# Patient Record
Sex: Female | Born: 1960
Health system: Southern US, Community
[De-identification: ages and names within clinical notes are randomized; demographics above are authoritative.]

## PROBLEM LIST (undated history)

## (undated) ENCOUNTER — Ambulatory Visit (HOSPITAL_BASED_OUTPATIENT_CLINIC_OR_DEPARTMENT_OTHER): Source: Home / Self Care

## (undated) DIAGNOSIS — E039 Hypothyroidism, unspecified: Secondary | ICD-10-CM

## (undated) DIAGNOSIS — Z8669 Personal history of other diseases of the nervous system and sense organs: Secondary | ICD-10-CM

## (undated) DIAGNOSIS — F329 Major depressive disorder, single episode, unspecified: Secondary | ICD-10-CM

## (undated) DIAGNOSIS — M810 Age-related osteoporosis without current pathological fracture: Secondary | ICD-10-CM

## (undated) DIAGNOSIS — N183 Chronic kidney disease, stage 3 unspecified: Secondary | ICD-10-CM

## (undated) DIAGNOSIS — R1310 Dysphagia, unspecified: Secondary | ICD-10-CM

## (undated) DIAGNOSIS — I739 Peripheral vascular disease, unspecified: Secondary | ICD-10-CM

## (undated) DIAGNOSIS — F410 Panic disorder [episodic paroxysmal anxiety] without agoraphobia: Secondary | ICD-10-CM

## (undated) DIAGNOSIS — G959 Disease of spinal cord, unspecified: Secondary | ICD-10-CM

## (undated) DIAGNOSIS — F4310 Post-traumatic stress disorder, unspecified: Secondary | ICD-10-CM

## (undated) DIAGNOSIS — I7409 Other arterial embolism and thrombosis of abdominal aorta: Secondary | ICD-10-CM

## (undated) DIAGNOSIS — I998 Other disorder of circulatory system: Secondary | ICD-10-CM

## (undated) DIAGNOSIS — F32A Depression, unspecified: Secondary | ICD-10-CM

## (undated) DIAGNOSIS — G894 Chronic pain syndrome: Secondary | ICD-10-CM

## (undated) DIAGNOSIS — N179 Acute kidney failure, unspecified: Secondary | ICD-10-CM

## (undated) DIAGNOSIS — E785 Hyperlipidemia, unspecified: Secondary | ICD-10-CM

## (undated) HISTORY — DX: Other disorder of circulatory system: I99.8

## (undated) HISTORY — DX: Acute kidney failure, unspecified: N17.9

## (undated) HISTORY — DX: Chronic pain syndrome: G89.4

## (undated) HISTORY — DX: Peripheral vascular disease, unspecified: I73.9

## (undated) HISTORY — PX: BACK SURGERY: SHX140

## (undated) HISTORY — DX: Other arterial embolism and thrombosis of abdominal aorta: I74.09

---

## 2014-01-09 DIAGNOSIS — F32A Depression, unspecified: Secondary | ICD-10-CM | POA: Insufficient documentation

## 2014-01-09 DIAGNOSIS — F329 Major depressive disorder, single episode, unspecified: Secondary | ICD-10-CM | POA: Insufficient documentation

## 2015-07-08 DIAGNOSIS — M810 Age-related osteoporosis without current pathological fracture: Secondary | ICD-10-CM | POA: Diagnosis not present

## 2015-07-08 DIAGNOSIS — Z6821 Body mass index (BMI) 21.0-21.9, adult: Secondary | ICD-10-CM | POA: Diagnosis not present

## 2015-07-08 DIAGNOSIS — Z23 Encounter for immunization: Secondary | ICD-10-CM | POA: Diagnosis not present

## 2015-07-08 DIAGNOSIS — E781 Pure hyperglyceridemia: Secondary | ICD-10-CM | POA: Diagnosis not present

## 2015-07-08 DIAGNOSIS — Z87891 Personal history of nicotine dependence: Secondary | ICD-10-CM | POA: Diagnosis not present

## 2015-07-08 DIAGNOSIS — Z720 Tobacco use: Secondary | ICD-10-CM | POA: Diagnosis not present

## 2015-07-08 DIAGNOSIS — R7309 Other abnormal glucose: Secondary | ICD-10-CM | POA: Diagnosis not present

## 2015-07-08 DIAGNOSIS — E039 Hypothyroidism, unspecified: Secondary | ICD-10-CM | POA: Diagnosis not present

## 2015-07-16 DIAGNOSIS — F4310 Post-traumatic stress disorder, unspecified: Secondary | ICD-10-CM | POA: Diagnosis not present

## 2015-07-24 DIAGNOSIS — Z1231 Encounter for screening mammogram for malignant neoplasm of breast: Secondary | ICD-10-CM | POA: Diagnosis not present

## 2015-11-04 DIAGNOSIS — R7301 Impaired fasting glucose: Secondary | ICD-10-CM | POA: Diagnosis not present

## 2015-11-04 DIAGNOSIS — Z23 Encounter for immunization: Secondary | ICD-10-CM | POA: Diagnosis not present

## 2015-11-04 DIAGNOSIS — J00 Acute nasopharyngitis [common cold]: Secondary | ICD-10-CM | POA: Diagnosis not present

## 2015-11-04 DIAGNOSIS — H6122 Impacted cerumen, left ear: Secondary | ICD-10-CM | POA: Diagnosis not present

## 2015-11-04 DIAGNOSIS — R7303 Prediabetes: Secondary | ICD-10-CM | POA: Diagnosis not present

## 2015-11-04 DIAGNOSIS — E039 Hypothyroidism, unspecified: Secondary | ICD-10-CM | POA: Diagnosis not present

## 2015-11-04 DIAGNOSIS — Z87891 Personal history of nicotine dependence: Secondary | ICD-10-CM | POA: Diagnosis not present

## 2015-11-04 DIAGNOSIS — M810 Age-related osteoporosis without current pathological fracture: Secondary | ICD-10-CM | POA: Diagnosis not present

## 2015-11-04 DIAGNOSIS — E784 Other hyperlipidemia: Secondary | ICD-10-CM | POA: Diagnosis not present

## 2015-11-05 DIAGNOSIS — J00 Acute nasopharyngitis [common cold]: Secondary | ICD-10-CM | POA: Diagnosis not present

## 2015-11-05 DIAGNOSIS — Z23 Encounter for immunization: Secondary | ICD-10-CM | POA: Diagnosis not present

## 2015-11-05 DIAGNOSIS — H6122 Impacted cerumen, left ear: Secondary | ICD-10-CM | POA: Diagnosis not present

## 2015-11-05 DIAGNOSIS — E784 Other hyperlipidemia: Secondary | ICD-10-CM | POA: Diagnosis not present

## 2015-11-05 DIAGNOSIS — R7303 Prediabetes: Secondary | ICD-10-CM | POA: Diagnosis not present

## 2015-11-05 DIAGNOSIS — M810 Age-related osteoporosis without current pathological fracture: Secondary | ICD-10-CM | POA: Diagnosis not present

## 2015-11-05 DIAGNOSIS — E039 Hypothyroidism, unspecified: Secondary | ICD-10-CM | POA: Diagnosis not present

## 2015-11-05 DIAGNOSIS — Z87891 Personal history of nicotine dependence: Secondary | ICD-10-CM | POA: Diagnosis not present

## 2015-11-06 DIAGNOSIS — E784 Other hyperlipidemia: Secondary | ICD-10-CM | POA: Diagnosis not present

## 2015-11-06 DIAGNOSIS — J00 Acute nasopharyngitis [common cold]: Secondary | ICD-10-CM | POA: Diagnosis not present

## 2015-11-06 DIAGNOSIS — M810 Age-related osteoporosis without current pathological fracture: Secondary | ICD-10-CM | POA: Diagnosis not present

## 2015-11-06 DIAGNOSIS — H6122 Impacted cerumen, left ear: Secondary | ICD-10-CM | POA: Diagnosis not present

## 2015-11-06 DIAGNOSIS — E039 Hypothyroidism, unspecified: Secondary | ICD-10-CM | POA: Diagnosis not present

## 2015-11-06 DIAGNOSIS — Z23 Encounter for immunization: Secondary | ICD-10-CM | POA: Diagnosis not present

## 2015-11-06 DIAGNOSIS — Z87891 Personal history of nicotine dependence: Secondary | ICD-10-CM | POA: Diagnosis not present

## 2015-11-06 DIAGNOSIS — R7303 Prediabetes: Secondary | ICD-10-CM | POA: Diagnosis not present

## 2015-11-07 DIAGNOSIS — M810 Age-related osteoporosis without current pathological fracture: Secondary | ICD-10-CM | POA: Diagnosis not present

## 2015-11-07 DIAGNOSIS — E039 Hypothyroidism, unspecified: Secondary | ICD-10-CM | POA: Diagnosis not present

## 2015-11-07 DIAGNOSIS — E784 Other hyperlipidemia: Secondary | ICD-10-CM | POA: Diagnosis not present

## 2015-11-07 DIAGNOSIS — Z23 Encounter for immunization: Secondary | ICD-10-CM | POA: Diagnosis not present

## 2015-11-07 DIAGNOSIS — J00 Acute nasopharyngitis [common cold]: Secondary | ICD-10-CM | POA: Diagnosis not present

## 2015-11-07 DIAGNOSIS — H6122 Impacted cerumen, left ear: Secondary | ICD-10-CM | POA: Diagnosis not present

## 2015-11-07 DIAGNOSIS — R7303 Prediabetes: Secondary | ICD-10-CM | POA: Diagnosis not present

## 2015-11-07 DIAGNOSIS — Z87891 Personal history of nicotine dependence: Secondary | ICD-10-CM | POA: Diagnosis not present

## 2015-11-08 DIAGNOSIS — Z23 Encounter for immunization: Secondary | ICD-10-CM | POA: Diagnosis not present

## 2015-11-08 DIAGNOSIS — M810 Age-related osteoporosis without current pathological fracture: Secondary | ICD-10-CM | POA: Diagnosis not present

## 2015-11-08 DIAGNOSIS — J00 Acute nasopharyngitis [common cold]: Secondary | ICD-10-CM | POA: Diagnosis not present

## 2015-11-08 DIAGNOSIS — R7303 Prediabetes: Secondary | ICD-10-CM | POA: Diagnosis not present

## 2015-11-08 DIAGNOSIS — Z87891 Personal history of nicotine dependence: Secondary | ICD-10-CM | POA: Diagnosis not present

## 2015-11-08 DIAGNOSIS — E039 Hypothyroidism, unspecified: Secondary | ICD-10-CM | POA: Diagnosis not present

## 2015-11-08 DIAGNOSIS — E784 Other hyperlipidemia: Secondary | ICD-10-CM | POA: Diagnosis not present

## 2015-11-08 DIAGNOSIS — H6122 Impacted cerumen, left ear: Secondary | ICD-10-CM | POA: Diagnosis not present

## 2015-11-09 DIAGNOSIS — Z23 Encounter for immunization: Secondary | ICD-10-CM | POA: Diagnosis not present

## 2015-11-09 DIAGNOSIS — M810 Age-related osteoporosis without current pathological fracture: Secondary | ICD-10-CM | POA: Diagnosis not present

## 2015-11-09 DIAGNOSIS — E039 Hypothyroidism, unspecified: Secondary | ICD-10-CM | POA: Diagnosis not present

## 2015-11-09 DIAGNOSIS — Z87891 Personal history of nicotine dependence: Secondary | ICD-10-CM | POA: Diagnosis not present

## 2015-11-09 DIAGNOSIS — E784 Other hyperlipidemia: Secondary | ICD-10-CM | POA: Diagnosis not present

## 2015-11-09 DIAGNOSIS — R7303 Prediabetes: Secondary | ICD-10-CM | POA: Diagnosis not present

## 2015-11-09 DIAGNOSIS — H6122 Impacted cerumen, left ear: Secondary | ICD-10-CM | POA: Diagnosis not present

## 2015-11-09 DIAGNOSIS — J00 Acute nasopharyngitis [common cold]: Secondary | ICD-10-CM | POA: Diagnosis not present

## 2015-11-11 DIAGNOSIS — Z0000 Encounter for general adult medical examination without abnormal findings: Secondary | ICD-10-CM | POA: Diagnosis not present

## 2015-11-11 DIAGNOSIS — M545 Low back pain: Secondary | ICD-10-CM | POA: Diagnosis not present

## 2015-11-11 DIAGNOSIS — Z6821 Body mass index (BMI) 21.0-21.9, adult: Secondary | ICD-10-CM | POA: Diagnosis not present

## 2016-05-18 DIAGNOSIS — Z981 Arthrodesis status: Secondary | ICD-10-CM

## 2016-05-18 HISTORY — DX: Arthrodesis status: Z98.1

## 2016-11-12 DIAGNOSIS — R69 Illness, unspecified: Secondary | ICD-10-CM | POA: Diagnosis not present

## 2016-12-26 DIAGNOSIS — S61411A Laceration without foreign body of right hand, initial encounter: Secondary | ICD-10-CM | POA: Diagnosis not present

## 2016-12-26 DIAGNOSIS — Z23 Encounter for immunization: Secondary | ICD-10-CM | POA: Diagnosis not present

## 2016-12-26 DIAGNOSIS — W25XXXA Contact with sharp glass, initial encounter: Secondary | ICD-10-CM | POA: Diagnosis not present

## 2016-12-30 DIAGNOSIS — I70229 Atherosclerosis of native arteries of extremities with rest pain, unspecified extremity: Secondary | ICD-10-CM

## 2016-12-30 DIAGNOSIS — I7409 Other arterial embolism and thrombosis of abdominal aorta: Secondary | ICD-10-CM | POA: Insufficient documentation

## 2016-12-30 HISTORY — DX: Atherosclerosis of native arteries of extremities with rest pain, unspecified extremity: I70.229

## 2016-12-30 HISTORY — DX: Other arterial embolism and thrombosis of abdominal aorta: I74.09

## 2016-12-30 HISTORY — PX: TRANSTHORACIC ECHOCARDIOGRAM: SHX275

## 2017-01-03 DIAGNOSIS — L0390 Cellulitis, unspecified: Secondary | ICD-10-CM | POA: Diagnosis not present

## 2017-01-03 DIAGNOSIS — M79643 Pain in unspecified hand: Secondary | ICD-10-CM | POA: Diagnosis not present

## 2017-01-03 DIAGNOSIS — S61409A Unspecified open wound of unspecified hand, initial encounter: Secondary | ICD-10-CM | POA: Diagnosis not present

## 2017-01-03 DIAGNOSIS — Z4800 Encounter for change or removal of nonsurgical wound dressing: Secondary | ICD-10-CM | POA: Diagnosis not present

## 2017-01-03 DIAGNOSIS — Z6821 Body mass index (BMI) 21.0-21.9, adult: Secondary | ICD-10-CM | POA: Diagnosis not present

## 2017-01-07 DIAGNOSIS — S61411A Laceration without foreign body of right hand, initial encounter: Secondary | ICD-10-CM | POA: Diagnosis not present

## 2017-01-07 DIAGNOSIS — L0390 Cellulitis, unspecified: Secondary | ICD-10-CM | POA: Diagnosis not present

## 2017-01-07 DIAGNOSIS — R1110 Vomiting, unspecified: Secondary | ICD-10-CM | POA: Diagnosis not present

## 2017-01-07 DIAGNOSIS — R110 Nausea: Secondary | ICD-10-CM | POA: Diagnosis not present

## 2017-01-07 DIAGNOSIS — Z4802 Encounter for removal of sutures: Secondary | ICD-10-CM | POA: Diagnosis not present

## 2017-01-08 ENCOUNTER — Encounter (HOSPITAL_COMMUNITY)
Admission: EM | Disposition: A | Discharge status: Rehabilitation Facility | Payer: Self-pay | Source: Home / Self Care | Attending: Vascular Surgery

## 2017-01-08 ENCOUNTER — Inpatient Hospital Stay (HOSPITAL_COMMUNITY): Payer: Medicare HMO | Admitting: Anesthesiology

## 2017-01-08 ENCOUNTER — Emergency Department (HOSPITAL_COMMUNITY): Payer: Medicare HMO | Admitting: Certified Registered Nurse Anesthetist

## 2017-01-08 ENCOUNTER — Inpatient Hospital Stay (HOSPITAL_COMMUNITY)
Admission: EM | Admit: 2017-01-08 | Discharge: 2017-01-13 | DRG: 271 | Disposition: A | Discharge status: Rehabilitation Facility | Payer: Medicare HMO | Attending: Vascular Surgery | Admitting: Vascular Surgery

## 2017-01-08 ENCOUNTER — Encounter (HOSPITAL_COMMUNITY): Payer: Self-pay

## 2017-01-08 DIAGNOSIS — I743 Embolism and thrombosis of arteries of the lower extremities: Principal | ICD-10-CM | POA: Diagnosis present

## 2017-01-08 DIAGNOSIS — Z79899 Other long term (current) drug therapy: Secondary | ICD-10-CM

## 2017-01-08 DIAGNOSIS — L270 Generalized skin eruption due to drugs and medicaments taken internally: Secondary | ICD-10-CM | POA: Diagnosis not present

## 2017-01-08 DIAGNOSIS — N183 Chronic kidney disease, stage 3 unspecified: Secondary | ICD-10-CM

## 2017-01-08 DIAGNOSIS — M79662 Pain in left lower leg: Secondary | ICD-10-CM | POA: Diagnosis not present

## 2017-01-08 DIAGNOSIS — D631 Anemia in chronic kidney disease: Secondary | ICD-10-CM | POA: Diagnosis not present

## 2017-01-08 DIAGNOSIS — N141 Nephropathy induced by other drugs, medicaments and biological substances: Secondary | ICD-10-CM | POA: Diagnosis not present

## 2017-01-08 DIAGNOSIS — I8291 Chronic embolism and thrombosis of unspecified vein: Secondary | ICD-10-CM | POA: Diagnosis not present

## 2017-01-08 DIAGNOSIS — E785 Hyperlipidemia, unspecified: Secondary | ICD-10-CM | POA: Diagnosis present

## 2017-01-08 DIAGNOSIS — D696 Thrombocytopenia, unspecified: Secondary | ICD-10-CM | POA: Diagnosis not present

## 2017-01-08 DIAGNOSIS — M6282 Rhabdomyolysis: Secondary | ICD-10-CM | POA: Diagnosis not present

## 2017-01-08 DIAGNOSIS — I70202 Unspecified atherosclerosis of native arteries of extremities, left leg: Secondary | ICD-10-CM | POA: Diagnosis not present

## 2017-01-08 DIAGNOSIS — M810 Age-related osteoporosis without current pathological fracture: Secondary | ICD-10-CM | POA: Diagnosis present

## 2017-01-08 DIAGNOSIS — M79605 Pain in left leg: Secondary | ICD-10-CM | POA: Diagnosis not present

## 2017-01-08 DIAGNOSIS — R52 Pain, unspecified: Secondary | ICD-10-CM | POA: Diagnosis not present

## 2017-01-08 DIAGNOSIS — E870 Hyperosmolality and hypernatremia: Secondary | ICD-10-CM | POA: Diagnosis not present

## 2017-01-08 DIAGNOSIS — E1129 Type 2 diabetes mellitus with other diabetic kidney complication: Secondary | ICD-10-CM | POA: Diagnosis not present

## 2017-01-08 DIAGNOSIS — Z419 Encounter for procedure for purposes other than remedying health state, unspecified: Secondary | ICD-10-CM

## 2017-01-08 DIAGNOSIS — M79672 Pain in left foot: Secondary | ICD-10-CM | POA: Diagnosis not present

## 2017-01-08 DIAGNOSIS — F410 Panic disorder [episodic paroxysmal anxiety] without agoraphobia: Secondary | ICD-10-CM | POA: Diagnosis present

## 2017-01-08 DIAGNOSIS — F17200 Nicotine dependence, unspecified, uncomplicated: Secondary | ICD-10-CM | POA: Diagnosis present

## 2017-01-08 DIAGNOSIS — F4310 Post-traumatic stress disorder, unspecified: Secondary | ICD-10-CM

## 2017-01-08 DIAGNOSIS — G959 Disease of spinal cord, unspecified: Secondary | ICD-10-CM | POA: Diagnosis not present

## 2017-01-08 DIAGNOSIS — T508X5A Adverse effect of diagnostic agents, initial encounter: Secondary | ICD-10-CM | POA: Diagnosis not present

## 2017-01-08 DIAGNOSIS — M62262 Nontraumatic ischemic infarction of muscle, left lower leg: Secondary | ICD-10-CM | POA: Diagnosis not present

## 2017-01-08 DIAGNOSIS — Z89612 Acquired absence of left leg above knee: Secondary | ICD-10-CM | POA: Diagnosis not present

## 2017-01-08 DIAGNOSIS — E1152 Type 2 diabetes mellitus with diabetic peripheral angiopathy with gangrene: Secondary | ICD-10-CM | POA: Diagnosis present

## 2017-01-08 DIAGNOSIS — G894 Chronic pain syndrome: Secondary | ICD-10-CM | POA: Diagnosis present

## 2017-01-08 DIAGNOSIS — D62 Acute posthemorrhagic anemia: Secondary | ICD-10-CM | POA: Diagnosis not present

## 2017-01-08 DIAGNOSIS — N179 Acute kidney failure, unspecified: Secondary | ICD-10-CM | POA: Diagnosis not present

## 2017-01-08 DIAGNOSIS — E039 Hypothyroidism, unspecified: Secondary | ICD-10-CM | POA: Diagnosis present

## 2017-01-08 DIAGNOSIS — I129 Hypertensive chronic kidney disease with stage 1 through stage 4 chronic kidney disease, or unspecified chronic kidney disease: Secondary | ICD-10-CM | POA: Diagnosis not present

## 2017-01-08 DIAGNOSIS — D72829 Elevated white blood cell count, unspecified: Secondary | ICD-10-CM

## 2017-01-08 DIAGNOSIS — I998 Other disorder of circulatory system: Secondary | ICD-10-CM | POA: Diagnosis not present

## 2017-01-08 DIAGNOSIS — R112 Nausea with vomiting, unspecified: Secondary | ICD-10-CM | POA: Diagnosis not present

## 2017-01-08 DIAGNOSIS — I745 Embolism and thrombosis of iliac artery: Secondary | ICD-10-CM | POA: Diagnosis present

## 2017-01-08 DIAGNOSIS — I82402 Acute embolism and thrombosis of unspecified deep veins of left lower extremity: Secondary | ICD-10-CM | POA: Diagnosis not present

## 2017-01-08 DIAGNOSIS — G546 Phantom limb syndrome with pain: Secondary | ICD-10-CM

## 2017-01-08 DIAGNOSIS — R69 Illness, unspecified: Secondary | ICD-10-CM | POA: Diagnosis not present

## 2017-01-08 DIAGNOSIS — E1122 Type 2 diabetes mellitus with diabetic chronic kidney disease: Secondary | ICD-10-CM | POA: Diagnosis present

## 2017-01-08 DIAGNOSIS — I10 Essential (primary) hypertension: Secondary | ICD-10-CM | POA: Diagnosis not present

## 2017-01-08 DIAGNOSIS — F329 Major depressive disorder, single episode, unspecified: Secondary | ICD-10-CM | POA: Diagnosis present

## 2017-01-08 DIAGNOSIS — S78112A Complete traumatic amputation at level between left hip and knee, initial encounter: Secondary | ICD-10-CM

## 2017-01-08 DIAGNOSIS — I739 Peripheral vascular disease, unspecified: Secondary | ICD-10-CM | POA: Diagnosis not present

## 2017-01-08 HISTORY — DX: Disease of spinal cord, unspecified: G95.9

## 2017-01-08 HISTORY — PX: EMBOLECTOMY: SHX44

## 2017-01-08 HISTORY — DX: Hyperlipidemia, unspecified: E78.5

## 2017-01-08 HISTORY — DX: Depression, unspecified: F32.A

## 2017-01-08 HISTORY — PX: AORTOGRAM: SHX6300

## 2017-01-08 HISTORY — DX: Dysphagia, unspecified: R13.10

## 2017-01-08 HISTORY — DX: Hypothyroidism, unspecified: E03.9

## 2017-01-08 HISTORY — DX: Panic disorder (episodic paroxysmal anxiety): F41.0

## 2017-01-08 HISTORY — PX: INSERTION OF ILIAC STENT: SHX6256

## 2017-01-08 HISTORY — DX: Major depressive disorder, single episode, unspecified: F32.9

## 2017-01-08 HISTORY — DX: Other disorder of circulatory system: I99.8

## 2017-01-08 HISTORY — DX: Post-traumatic stress disorder, unspecified: F43.10

## 2017-01-08 HISTORY — PX: ENDARTERECTOMY POPLITEAL: SHX5806

## 2017-01-08 HISTORY — DX: Age-related osteoporosis without current pathological fracture: M81.0

## 2017-01-08 HISTORY — DX: Personal history of other diseases of the nervous system and sense organs: Z86.69

## 2017-01-08 HISTORY — PX: LOWER EXTREMITY ANGIOGRAM: SHX5508

## 2017-01-08 HISTORY — PX: PATCH ANGIOPLASTY: SHX6230

## 2017-01-08 HISTORY — PX: ARTERY EXPLORATION: SHX5110

## 2017-01-08 LAB — BASIC METABOLIC PANEL
Anion gap: 15 (ref 5–15)
Anion gap: 9 (ref 5–15)
BUN: 23 mg/dL — ABNORMAL HIGH (ref 6–20)
BUN: 18 mg/dL (ref 6–20)
CALCIUM: 8.2 mg/dL — AB (ref 8.9–10.3)
CHLORIDE: 115 mmol/L — AB (ref 101–111)
CO2: 13 mmol/L — ABNORMAL LOW (ref 22–32)
CO2: 17 mmol/L — AB (ref 22–32)
CREATININE: 1.61 mg/dL — AB (ref 0.44–1.00)
CREATININE: 1.42 mg/dL — AB (ref 0.44–1.00)
Calcium: 7.7 mg/dL — ABNORMAL LOW (ref 8.9–10.3)
Chloride: 107 mmol/L (ref 101–111)
GFR calc Af Amer: 41 mL/min — ABNORMAL LOW (ref >60)
GFR calc Af Amer: 48 mL/min — ABNORMAL LOW (ref >60)
GFR calc non Af Amer: 36 mL/min — ABNORMAL LOW (ref >60)
GFR calc non Af Amer: 41 mL/min — ABNORMAL LOW (ref >60)
GLUCOSE: 151 mg/dL — AB (ref 65–99)
Glucose, Bld: 107 mg/dL — ABNORMAL HIGH (ref 65–99)
Potassium: 3.5 mmol/L (ref 3.5–5.1)
Potassium: 3.9 mmol/L (ref 3.5–5.1)
SODIUM: 135 mmol/L (ref 135–145)
Sodium: 141 mmol/L (ref 135–145)

## 2017-01-08 LAB — POCT I-STAT 7, (LYTES, BLD GAS, ICA,H+H)
Acid-base deficit: 9 mmol/L — ABNORMAL HIGH (ref 0.0–2.0)
Acid-base deficit: 10 mmol/L — ABNORMAL HIGH (ref 0.0–2.0)
Acid-base deficit: 8 mmol/L — ABNORMAL HIGH (ref 0.0–2.0)
Acid-base deficit: 5 mmol/L — ABNORMAL HIGH (ref 0.0–2.0)
BICARBONATE: 17 mmol/L — AB (ref 20.0–28.0)
Bicarbonate: 16.7 mmol/L — ABNORMAL LOW (ref 20.0–28.0)
Bicarbonate: 18.7 mmol/L — ABNORMAL LOW (ref 20.0–28.0)
Bicarbonate: 21.1 mmol/L (ref 20.0–28.0)
CALCIUM ION: 1 mmol/L — AB (ref 1.15–1.40)
Calcium, Ion: 1.04 mmol/L — ABNORMAL LOW (ref 1.15–1.40)
Calcium, Ion: 0.9 mmol/L — ABNORMAL LOW (ref 1.15–1.40)
Calcium, Ion: 1.2 mmol/L (ref 1.15–1.40)
HCT: 26 % — ABNORMAL LOW (ref 36.0–46.0)
HEMATOCRIT: 29 % — AB (ref 36.0–46.0)
HEMATOCRIT: 31 % — AB (ref 36.0–46.0)
HEMATOCRIT: 27 % — AB (ref 36.0–46.0)
HEMOGLOBIN: 9.9 g/dL — AB (ref 12.0–15.0)
HEMOGLOBIN: 10.5 g/dL — AB (ref 12.0–15.0)
Hemoglobin: 8.8 g/dL — ABNORMAL LOW (ref 12.0–15.0)
Hemoglobin: 9.2 g/dL — ABNORMAL LOW (ref 12.0–15.0)
O2 SAT: 100 %
O2 Saturation: 100 %
O2 Saturation: 100 %
O2 Saturation: 100 %
PCO2 ART: 37 mmHg (ref 32.0–48.0)
PCO2 ART: 46.5 mmHg (ref 32.0–48.0)
PO2 ART: 286 mmHg — AB (ref 83.0–108.0)
PO2 ART: 546 mmHg — AB (ref 83.0–108.0)
POTASSIUM: 4.4 mmol/L (ref 3.5–5.1)
POTASSIUM: 4.4 mmol/L (ref 3.5–5.1)
POTASSIUM: 4.1 mmol/L (ref 3.5–5.1)
Patient temperature: 36.4
Patient temperature: 36
Patient temperature: 39.73
Potassium: 3.8 mmol/L (ref 3.5–5.1)
Sodium: 143 mmol/L (ref 135–145)
Sodium: 143 mmol/L (ref 135–145)
Sodium: 144 mmol/L (ref 135–145)
Sodium: 142 mmol/L (ref 135–145)
TCO2: 18 mmol/L (ref 0–100)
TCO2: 18 mmol/L (ref 0–100)
TCO2: 20 mmol/L (ref 0–100)
TCO2: 22 mmol/L (ref 0–100)
pCO2 arterial: 33.7 mmHg (ref 32.0–48.0)
pCO2 arterial: 40 mmHg (ref 32.0–48.0)
pH, Arterial: 7.307 — ABNORMAL LOW (ref 7.350–7.450)
pH, Arterial: 7.263 — ABNORMAL LOW (ref 7.350–7.450)
pH, Arterial: 7.272 — ABNORMAL LOW (ref 7.350–7.450)
pH, Arterial: 7.277 — ABNORMAL LOW (ref 7.350–7.450)
pO2, Arterial: 272 mmHg — ABNORMAL HIGH (ref 83.0–108.0)
pO2, Arterial: 278 mmHg — ABNORMAL HIGH (ref 83.0–108.0)

## 2017-01-08 LAB — CBC WITH DIFFERENTIAL/PLATELET
BASOS PCT: 0 %
Basophils Absolute: 0 10*3/uL (ref 0.0–0.1)
Eosinophils Absolute: 0 10*3/uL (ref 0.0–0.7)
Eosinophils Relative: 0 %
HCT: 37.9 % (ref 36.0–46.0)
HEMOGLOBIN: 13 g/dL (ref 12.0–15.0)
Lymphocytes Relative: 11 %
Lymphs Abs: 1.9 10*3/uL (ref 0.7–4.0)
MCH: 30.2 pg (ref 26.0–34.0)
MCHC: 34.3 g/dL (ref 30.0–36.0)
MCV: 88.1 fL (ref 78.0–100.0)
MONO ABS: 0.6 10*3/uL (ref 0.1–1.0)
Monocytes Relative: 3 %
NEUTROS PCT: 86 %
Neutro Abs: 14.6 10*3/uL — ABNORMAL HIGH (ref 1.7–7.7)
PLATELETS: 238 10*3/uL (ref 150–400)
RBC: 4.3 MIL/uL (ref 3.87–5.11)
RDW: 12.8 % (ref 11.5–15.5)
WBC: 17.1 10*3/uL — ABNORMAL HIGH (ref 4.0–10.5)

## 2017-01-08 LAB — I-STAT CG4 LACTIC ACID, ED: Lactic Acid, Venous: 2.56 mmol/L (ref 0.5–1.9)

## 2017-01-08 LAB — CK TOTAL AND CKMB (NOT AT ARMC)
CK, MB: 440.7 ng/mL — ABNORMAL HIGH (ref 0.5–5.0)
Relative Index: 1.5 (ref 0.0–2.5)
Total CK: 30230 U/L — ABNORMAL HIGH (ref 38–234)

## 2017-01-08 LAB — GLUCOSE, CAPILLARY: Glucose-Capillary: 161 mg/dL — ABNORMAL HIGH (ref 65–99)

## 2017-01-08 LAB — APTT: aPTT: 50 seconds — ABNORMAL HIGH (ref 24–36)

## 2017-01-08 LAB — CBC
HEMATOCRIT: 28.6 % — AB (ref 36.0–46.0)
Hemoglobin: 9.9 g/dL — ABNORMAL LOW (ref 12.0–15.0)
MCH: 30 pg (ref 26.0–34.0)
MCHC: 34.6 g/dL (ref 30.0–36.0)
MCV: 86.7 fL (ref 78.0–100.0)
Platelets: 114 10*3/uL — ABNORMAL LOW (ref 150–400)
RBC: 3.3 MIL/uL — ABNORMAL LOW (ref 3.87–5.11)
RDW: 14.8 % (ref 11.5–15.5)
WBC: 13.8 10*3/uL — ABNORMAL HIGH (ref 4.0–10.5)

## 2017-01-08 LAB — PREPARE RBC (CROSSMATCH)

## 2017-01-08 LAB — PROTIME-INR
INR: 1.14
Prothrombin Time: 14.7 seconds (ref 11.4–15.2)

## 2017-01-08 LAB — CK: Total CK: 1992 U/L — ABNORMAL HIGH (ref 38–234)

## 2017-01-08 LAB — MRSA PCR SCREENING: MRSA BY PCR: NEGATIVE

## 2017-01-08 LAB — LACTIC ACID, PLASMA: Lactic Acid, Venous: 2.9 mmol/L (ref 0.5–1.9)

## 2017-01-08 LAB — ABO/RH: ABO/RH(D): A POS

## 2017-01-08 LAB — CBG MONITORING, ED: Glucose-Capillary: 106 mg/dL — ABNORMAL HIGH (ref 65–99)

## 2017-01-08 SURGERY — AORTOGRAM
Anesthesia: General | Laterality: Left

## 2017-01-08 SURGERY — LOWER EXTREMITY ANGIOGRAM
Anesthesia: General | Laterality: Left

## 2017-01-08 MED ORDER — NALOXONE HCL 0.4 MG/ML IJ SOLN: 0.4000 mg | INTRAMUSCULAR | Status: DC | PRN

## 2017-01-08 MED ORDER — MIDAZOLAM HCL 2 MG/2ML IJ SOLN
INTRAMUSCULAR | Status: AC
  Filled 2017-01-08: qty 2

## 2017-01-08 MED ORDER — SODIUM CHLORIDE 0.9 % IV BOLUS (SEPSIS)
500.0000 mL | Freq: Once | INTRAVENOUS | Status: AC
Start: 2017-01-08 — End: 2017-01-08
  Administered 2017-01-08: 500 mL via INTRAVENOUS

## 2017-01-08 MED ORDER — LIDOCAINE 2% (20 MG/ML) 5 ML SYRINGE
INTRAMUSCULAR | Status: AC
  Filled 2017-01-08: qty 5

## 2017-01-08 MED ORDER — ONDANSETRON HCL 4 MG/2ML IJ SOLN
INTRAMUSCULAR | Status: AC
  Filled 2017-01-08: qty 2

## 2017-01-08 MED ORDER — HEPARIN (PORCINE) IN NACL 100-0.45 UNIT/ML-% IJ SOLN
750.0000 [IU]/h | INTRAMUSCULAR | Status: DC
  Administered 2017-01-08: 750 [IU]/h via INTRAVENOUS
  Filled 2017-01-08: qty 250

## 2017-01-08 MED ORDER — HEPARIN SODIUM (PORCINE) 1000 UNIT/ML IJ SOLN
INTRAMUSCULAR | Status: DC | PRN
  Administered 2017-01-08: 5000 [IU] via INTRAVENOUS

## 2017-01-08 MED ORDER — CEFAZOLIN SODIUM 1 G IJ SOLR
INTRAMUSCULAR | Status: AC
  Filled 2017-01-08: qty 20

## 2017-01-08 MED ORDER — PANTOPRAZOLE SODIUM 40 MG PO TBEC
40.0000 mg | DELAYED_RELEASE_TABLET | Freq: Every day | ORAL | Status: DC
  Administered 2017-01-10 – 2017-01-13 (×4): 40 mg via ORAL
  Filled 2017-01-08 (×4): qty 1

## 2017-01-08 MED ORDER — PROPOFOL 10 MG/ML IV BOLUS
INTRAVENOUS | Status: DC | PRN
  Administered 2017-01-08: 70 mg via INTRAVENOUS
  Administered 2017-01-08: 10 mg via INTRAVENOUS

## 2017-01-08 MED ORDER — PHENOL 1.4 % MT LIQD
1.0000 | OROMUCOSAL | Status: DC | PRN
Start: 2017-01-08 — End: 2017-01-13

## 2017-01-08 MED ORDER — SODIUM CHLORIDE 0.9 % IV SOLN
INTRAVENOUS | Status: DC
  Administered 2017-01-08 (×2): via INTRAVENOUS

## 2017-01-08 MED ORDER — HEMOSTATIC AGENTS (NO CHARGE) OPTIME
TOPICAL | Status: DC | PRN
Start: 2017-01-08 — End: 2017-01-08
  Administered 2017-01-08: 1 via TOPICAL

## 2017-01-08 MED ORDER — HYDROMORPHONE HCL 2 MG/ML IJ SOLN
1.0000 mg | Freq: Once | INTRAMUSCULAR | Status: AC
  Administered 2017-01-08: 1 mg via INTRAVENOUS
  Filled 2017-01-08: qty 1

## 2017-01-08 MED ORDER — HEPARIN SODIUM (PORCINE) 5000 UNIT/ML IJ SOLN
INTRAMUSCULAR | Status: DC | PRN
  Administered 2017-01-08: 500 mL

## 2017-01-08 MED ORDER — METOPROLOL TARTRATE 5 MG/5ML IV SOLN: 2.0000 mg | INTRAVENOUS | Status: DC | PRN

## 2017-01-08 MED ORDER — CEFAZOLIN IN D5W 1 GM/50ML IV SOLN
INTRAVENOUS | Status: DC | PRN
  Administered 2017-01-08: 1 g via INTRAVENOUS

## 2017-01-08 MED ORDER — HYDROMORPHONE HCL 1 MG/ML IJ SOLN: 0.2500 mg | INTRAMUSCULAR | Status: DC | PRN

## 2017-01-08 MED ORDER — OXYCODONE-ACETAMINOPHEN 5-325 MG PO TABS
1.0000 | ORAL_TABLET | ORAL | Status: DC | PRN
  Administered 2017-01-10 – 2017-01-11 (×3): 2 via ORAL
  Administered 2017-01-13 (×2): 1 via ORAL
  Filled 2017-01-08: qty 1
  Filled 2017-01-08 (×2): qty 2
  Filled 2017-01-08: qty 1
  Filled 2017-01-08 (×2): qty 2

## 2017-01-08 MED ORDER — PROMETHAZINE HCL 25 MG/ML IJ SOLN
6.2500 mg | INTRAMUSCULAR | Status: DC | PRN
Start: 2017-01-08 — End: 2017-01-08

## 2017-01-08 MED ORDER — DIPHENHYDRAMINE HCL 50 MG/ML IJ SOLN: 12.5000 mg | Freq: Four times a day (QID) | INTRAMUSCULAR | Status: DC | PRN

## 2017-01-08 MED ORDER — SODIUM CHLORIDE 0.9 % IV SOLN: Freq: Once | INTRAVENOUS | Status: DC

## 2017-01-08 MED ORDER — PHENYLEPHRINE 40 MCG/ML (10ML) SYRINGE FOR IV PUSH (FOR BLOOD PRESSURE SUPPORT)
PREFILLED_SYRINGE | INTRAVENOUS | Status: AC
  Filled 2017-01-08: qty 10

## 2017-01-08 MED ORDER — HEPARIN SODIUM (PORCINE) 1000 UNIT/ML IJ SOLN
INTRAMUSCULAR | Status: AC
  Filled 2017-01-08: qty 1

## 2017-01-08 MED ORDER — PROMETHAZINE HCL 25 MG/ML IJ SOLN: 6.2500 mg | INTRAMUSCULAR | Status: DC | PRN

## 2017-01-08 MED ORDER — LORAZEPAM 2 MG/ML IJ SOLN
1.0000 mg | Freq: Once | INTRAMUSCULAR | Status: AC
  Administered 2017-01-08: 1 mg via INTRAVENOUS
  Filled 2017-01-08: qty 1

## 2017-01-08 MED ORDER — SUGAMMADEX SODIUM 200 MG/2ML IV SOLN
INTRAVENOUS | Status: AC
  Filled 2017-01-08: qty 2

## 2017-01-08 MED ORDER — EPHEDRINE 5 MG/ML INJ
INTRAVENOUS | Status: AC
  Filled 2017-01-08: qty 10

## 2017-01-08 MED ORDER — PROTAMINE SULFATE 10 MG/ML IV SOLN
INTRAVENOUS | Status: DC | PRN
  Administered 2017-01-08: 25 mg via INTRAVENOUS

## 2017-01-08 MED ORDER — FENTANYL CITRATE (PF) 250 MCG/5ML IJ SOLN
INTRAMUSCULAR | Status: DC | PRN
  Administered 2017-01-08 (×2): 50 ug via INTRAVENOUS

## 2017-01-08 MED ORDER — FENTANYL CITRATE (PF) 250 MCG/5ML IJ SOLN
INTRAMUSCULAR | Status: AC
  Filled 2017-01-08: qty 5

## 2017-01-08 MED ORDER — POTASSIUM CHLORIDE CRYS ER 20 MEQ PO TBCR: 20.0000 meq | EXTENDED_RELEASE_TABLET | Freq: Every day | ORAL | Status: DC | PRN

## 2017-01-08 MED ORDER — CALCIUM CHLORIDE 10 % IV SOLN
INTRAVENOUS | Status: DC | PRN
  Administered 2017-01-08: 1000 mg via INTRAVENOUS

## 2017-01-08 MED ORDER — ALUM & MAG HYDROXIDE-SIMETH 200-200-20 MG/5ML PO SUSP: 15.0000 mL | ORAL | Status: DC | PRN

## 2017-01-08 MED ORDER — ACETAMINOPHEN 325 MG RE SUPP: 325.0000 mg | RECTAL | Status: DC | PRN

## 2017-01-08 MED ORDER — SUCCINYLCHOLINE CHLORIDE 200 MG/10ML IV SOSY
PREFILLED_SYRINGE | INTRAVENOUS | Status: AC
  Filled 2017-01-08: qty 10

## 2017-01-08 MED ORDER — PROTAMINE SULFATE 10 MG/ML IV SOLN
INTRAVENOUS | Status: AC
  Filled 2017-01-08: qty 5

## 2017-01-08 MED ORDER — LABETALOL HCL 5 MG/ML IV SOLN: 10.0000 mg | INTRAVENOUS | Status: DC | PRN

## 2017-01-08 MED ORDER — 0.9 % SODIUM CHLORIDE (POUR BTL) OPTIME
TOPICAL | Status: DC | PRN
  Administered 2017-01-08: 1000 mL

## 2017-01-08 MED ORDER — MIDAZOLAM HCL 5 MG/5ML IJ SOLN
INTRAMUSCULAR | Status: DC | PRN
  Administered 2017-01-08: 1 mg via INTRAVENOUS

## 2017-01-08 MED ORDER — SUCCINYLCHOLINE CHLORIDE 20 MG/ML IJ SOLN
INTRAMUSCULAR | Status: DC | PRN
  Administered 2017-01-08: 80 mg via INTRAVENOUS

## 2017-01-08 MED ORDER — ONDANSETRON HCL 4 MG/2ML IJ SOLN
INTRAMUSCULAR | Status: DC | PRN
  Administered 2017-01-08: 4 mg via INTRAVENOUS

## 2017-01-08 MED ORDER — ONDANSETRON HCL 4 MG/2ML IJ SOLN
4.0000 mg | Freq: Four times a day (QID) | INTRAMUSCULAR | Status: DC | PRN
Start: 2017-01-08 — End: 2017-01-13
  Administered 2017-01-09: 4 mg via INTRAVENOUS
  Filled 2017-01-08: qty 2

## 2017-01-08 MED ORDER — IODIXANOL 320 MG/ML IV SOLN
INTRAVENOUS | Status: DC | PRN
  Administered 2017-01-08: 10 mL via INTRA_ARTERIAL

## 2017-01-08 MED ORDER — LACTATED RINGERS IV SOLN
INTRAVENOUS | Status: DC | PRN
  Administered 2017-01-08 (×3): via INTRAVENOUS

## 2017-01-08 MED ORDER — FENTANYL CITRATE (PF) 100 MCG/2ML IJ SOLN
INTRAMUSCULAR | Status: DC | PRN
  Administered 2017-01-08 (×10): 25 ug via INTRAVENOUS
  Administered 2017-01-08: 50 ug via INTRAVENOUS

## 2017-01-08 MED ORDER — SODIUM CHLORIDE 0.9 % IV SOLN
INTRAVENOUS | Status: DC
  Administered 2017-01-08: 100 mL/h via INTRAVENOUS
  Administered 2017-01-09: 11:00:00 via INTRAVENOUS

## 2017-01-08 MED ORDER — PHENYLEPHRINE HCL 10 MG/ML IJ SOLN
INTRAVENOUS | Status: DC | PRN
  Administered 2017-01-08: 20 ug/min via INTRAVENOUS

## 2017-01-08 MED ORDER — SODIUM BICARBONATE 8.4 % IV SOLN
INTRAVENOUS | Status: DC | PRN
  Administered 2017-01-08: 6 meq via INTRAVENOUS
  Administered 2017-01-08: 25 meq via INTRAVENOUS

## 2017-01-08 MED ORDER — DOCUSATE SODIUM 100 MG PO CAPS
100.0000 mg | ORAL_CAPSULE | Freq: Every day | ORAL | Status: DC
  Administered 2017-01-10 – 2017-01-13 (×4): 100 mg via ORAL
  Filled 2017-01-08 (×3): qty 1

## 2017-01-08 MED ORDER — GUAIFENESIN-DM 100-10 MG/5ML PO SYRP
15.0000 mL | ORAL_SOLUTION | ORAL | Status: DC | PRN
Start: 2017-01-08 — End: 2017-01-13
  Administered 2017-01-13: 15 mL via ORAL
  Filled 2017-01-08: qty 15

## 2017-01-08 MED ORDER — MORPHINE SULFATE 2 MG/ML IV SOLN
INTRAVENOUS | Status: AC
  Filled 2017-01-08: qty 30

## 2017-01-08 MED ORDER — SODIUM CHLORIDE 0.9% FLUSH: 9.0000 mL | INTRAVENOUS | Status: DC | PRN

## 2017-01-08 MED ORDER — MAGNESIUM SULFATE 2 GM/50ML IV SOLN
2.0000 g | Freq: Every day | INTRAVENOUS | Status: DC | PRN
  Filled 2017-01-08: qty 50

## 2017-01-08 MED ORDER — HEPARIN SODIUM (PORCINE) 1000 UNIT/ML IJ SOLN
INTRAMUSCULAR | Status: DC | PRN
  Administered 2017-01-08: 5000 [IU] via INTRAVENOUS
  Administered 2017-01-08 (×2): 2000 [IU] via INTRAVENOUS
  Administered 2017-01-08: 3000 [IU] via INTRAVENOUS
  Administered 2017-01-08: 2000 [IU] via INTRAVENOUS
  Administered 2017-01-08: 3000 [IU] via INTRAVENOUS

## 2017-01-08 MED ORDER — LIDOCAINE 2% (20 MG/ML) 5 ML SYRINGE
INTRAMUSCULAR | Status: DC | PRN
  Administered 2017-01-08: 80 mg via INTRAVENOUS

## 2017-01-08 MED ORDER — IODIXANOL 320 MG/ML IV SOLN
INTRAVENOUS | Status: DC | PRN
  Administered 2017-01-08: 50 mL via INTRA_ARTERIAL
  Administered 2017-01-08: 13 mL via INTRA_ARTERIAL
  Administered 2017-01-08 (×2): 50 mL via INTRA_ARTERIAL

## 2017-01-08 MED ORDER — MORPHINE SULFATE 2 MG/ML IV SOLN
INTRAVENOUS | Status: DC
  Administered 2017-01-08 – 2017-01-09 (×2): via INTRAVENOUS
  Administered 2017-01-09: 19.5 mg via INTRAVENOUS
  Administered 2017-01-09: 10.5 mg via INTRAVENOUS
  Administered 2017-01-09: 1.5 mg via INTRAVENOUS
  Administered 2017-01-09: 0 mg via INTRAVENOUS
  Administered 2017-01-09: 15 mg via INTRAVENOUS
  Administered 2017-01-09: 3 mg via INTRAVENOUS
  Administered 2017-01-10: 13.5 mg via INTRAVENOUS
  Administered 2017-01-10: 19:00:00 via INTRAVENOUS
  Administered 2017-01-10: 16.4 mg via INTRAVENOUS
  Administered 2017-01-10: 6 mg via INTRAVENOUS
  Administered 2017-01-10: 16.5 mg via INTRAVENOUS
  Administered 2017-01-10: 3 mg via INTRAVENOUS
  Administered 2017-01-11: 9 mg via INTRAVENOUS
  Administered 2017-01-11: 2 mL via INTRAVENOUS
  Administered 2017-01-11: 10.5 mg via INTRAVENOUS
  Administered 2017-01-11: 7.5 mg via INTRAVENOUS
  Administered 2017-01-11: 24 mg via INTRAVENOUS
  Administered 2017-01-11: 1.5 mg via INTRAVENOUS
  Administered 2017-01-12: 18 mg via INTRAVENOUS
  Administered 2017-01-12: 16.5 mL via INTRAVENOUS
  Administered 2017-01-12: 18 mg via INTRAVENOUS
  Administered 2017-01-12: 1.5 mg via INTRAVENOUS
  Administered 2017-01-12: 15:00:00 via INTRAVENOUS
  Administered 2017-01-13: 4.5 mg via INTRAVENOUS
  Filled 2017-01-08 (×4): qty 30

## 2017-01-08 MED ORDER — SUGAMMADEX SODIUM 200 MG/2ML IV SOLN
INTRAVENOUS | Status: DC | PRN
  Administered 2017-01-08: 100 mg via INTRAVENOUS

## 2017-01-08 MED ORDER — HYDRALAZINE HCL 20 MG/ML IJ SOLN: 5.0000 mg | INTRAMUSCULAR | Status: DC | PRN

## 2017-01-08 MED ORDER — PROPOFOL 10 MG/ML IV BOLUS
INTRAVENOUS | Status: DC | PRN
  Administered 2017-01-08: 50 mg via INTRAVENOUS

## 2017-01-08 MED ORDER — SODIUM CHLORIDE 0.9 % IJ SOLN
INTRAMUSCULAR | Status: AC
  Filled 2017-01-08: qty 10

## 2017-01-08 MED ORDER — DEXMEDETOMIDINE HCL IN NACL 200 MCG/50ML IV SOLN
INTRAVENOUS | Status: AC
  Filled 2017-01-08: qty 50

## 2017-01-08 MED ORDER — ARTIFICIAL TEARS OP OINT
TOPICAL_OINTMENT | OPHTHALMIC | Status: AC
  Filled 2017-01-08: qty 3.5

## 2017-01-08 MED ORDER — BISACODYL 10 MG RE SUPP: 10.0000 mg | Freq: Every day | RECTAL | Status: DC | PRN

## 2017-01-08 MED ORDER — HEPARIN (PORCINE) IN NACL 100-0.45 UNIT/ML-% IJ SOLN
500.0000 [IU]/h | INTRAMUSCULAR | Status: DC
  Administered 2017-01-08: 500 [IU]/h via INTRAVENOUS
  Filled 2017-01-08: qty 250

## 2017-01-08 MED ORDER — HEPARIN SODIUM (PORCINE) 1000 UNIT/ML IJ SOLN
INTRAMUSCULAR | Status: AC
  Filled 2017-01-08: qty 2

## 2017-01-08 MED ORDER — DEXTROSE 5 % IV SOLN
1.5000 g | Freq: Two times a day (BID) | INTRAVENOUS | Status: AC
  Administered 2017-01-09 (×2): 1.5 g via INTRAVENOUS
  Filled 2017-01-08 (×2): qty 1.5

## 2017-01-08 MED ORDER — LACTATED RINGERS IV SOLN
INTRAVENOUS | Status: DC | PRN
  Administered 2017-01-08: 18:00:00 via INTRAVENOUS

## 2017-01-08 MED ORDER — SODIUM CHLORIDE 0.9 % IV SOLN
INTRAVENOUS | Status: DC | PRN
  Administered 2017-01-08: 500 mL

## 2017-01-08 MED ORDER — ALBUMIN HUMAN 5 % IV SOLN
INTRAVENOUS | Status: DC | PRN
  Administered 2017-01-08: 19:00:00 via INTRAVENOUS

## 2017-01-08 MED ORDER — SODIUM CHLORIDE 0.9 % IV SOLN: 500.0000 mL | Freq: Once | INTRAVENOUS | Status: DC | PRN

## 2017-01-08 MED ORDER — ROCURONIUM BROMIDE 10 MG/ML (PF) SYRINGE
PREFILLED_SYRINGE | INTRAVENOUS | Status: DC | PRN
  Administered 2017-01-08: 20 mg via INTRAVENOUS
  Administered 2017-01-08 (×2): 30 mg via INTRAVENOUS
  Administered 2017-01-08: 20 mg via INTRAVENOUS

## 2017-01-08 MED ORDER — ROCURONIUM BROMIDE 50 MG/5ML IV SOSY
PREFILLED_SYRINGE | INTRAVENOUS | Status: AC
  Filled 2017-01-08: qty 5

## 2017-01-08 MED ORDER — ALBUMIN HUMAN 5 % IV SOLN
INTRAVENOUS | Status: DC | PRN
  Administered 2017-01-08: 13:00:00 via INTRAVENOUS

## 2017-01-08 MED ORDER — CEFAZOLIN SODIUM 1 G IJ SOLR
INTRAMUSCULAR | Status: AC
  Filled 2017-01-08: qty 10

## 2017-01-08 MED ORDER — LIDOCAINE HCL (CARDIAC) 20 MG/ML IV SOLN
INTRAVENOUS | Status: DC | PRN
  Administered 2017-01-08: 40 mg via INTRATRACHEAL

## 2017-01-08 MED ORDER — VASOPRESSIN 20 UNIT/ML IV SOLN
0.0300 [IU]/min | INTRAVENOUS | Status: DC
  Filled 2017-01-08: qty 2

## 2017-01-08 MED ORDER — DIPHENHYDRAMINE HCL 12.5 MG/5ML PO ELIX: 12.5000 mg | ORAL_SOLUTION | Freq: Four times a day (QID) | ORAL | Status: DC | PRN

## 2017-01-08 MED ORDER — HYDROMORPHONE HCL 1 MG/ML IJ SOLN: 0.5000 mg | INTRAMUSCULAR | Status: DC | PRN

## 2017-01-08 MED ORDER — PHENYLEPHRINE HCL 10 MG/ML IJ SOLN: INTRAVENOUS | Status: DC | PRN

## 2017-01-08 MED ORDER — SUCCINYLCHOLINE CHLORIDE 200 MG/10ML IV SOSY
PREFILLED_SYRINGE | INTRAVENOUS | Status: DC | PRN
  Administered 2017-01-08: 100 mg via INTRAVENOUS

## 2017-01-08 MED ORDER — PHENYLEPHRINE HCL 10 MG/ML IJ SOLN
INTRAVENOUS | Status: DC | PRN
  Administered 2017-01-08: 10 ug/min via INTRAVENOUS

## 2017-01-08 MED ORDER — PHENYLEPHRINE 40 MCG/ML (10ML) SYRINGE FOR IV PUSH (FOR BLOOD PRESSURE SUPPORT)
PREFILLED_SYRINGE | INTRAVENOUS | Status: DC | PRN
  Administered 2017-01-08 (×6): 40 ug via INTRAVENOUS

## 2017-01-08 MED ORDER — DEXMEDETOMIDINE HCL 200 MCG/2ML IV SOLN
INTRAVENOUS | Status: DC | PRN
  Administered 2017-01-08: 8 ug via INTRAVENOUS
  Administered 2017-01-08: 12 ug via INTRAVENOUS
  Administered 2017-01-08: 8 ug via INTRAVENOUS

## 2017-01-08 MED ORDER — ACETAMINOPHEN 325 MG PO TABS: 325.0000 mg | ORAL_TABLET | ORAL | Status: DC | PRN

## 2017-01-08 MED ORDER — CEFAZOLIN SODIUM 1 G IJ SOLR
INTRAMUSCULAR | Status: DC | PRN
  Administered 2017-01-08 (×2): 1 g via INTRAMUSCULAR

## 2017-01-08 SURGICAL SUPPLY — 122 items
BAG SNAP BAND KOVER 36X36 (MISCELLANEOUS) ×4 IMPLANT
BANDAGE ACE 4X5 VEL STRL LF (GAUZE/BANDAGES/DRESSINGS) IMPLANT
BANDAGE ESMARK 6X9 LF (GAUZE/BANDAGES/DRESSINGS) ×3 IMPLANT
BLADE SURG 11 STRL SS (BLADE) IMPLANT
BNDG COHESIVE 4X5 TAN STRL (GAUZE/BANDAGES/DRESSINGS) ×4 IMPLANT
BNDG ESMARK 6X9 LF (GAUZE/BANDAGES/DRESSINGS) ×4
CANISTER SUCTION 2500CC (MISCELLANEOUS) ×4 IMPLANT
CANNULA VESSEL 3MM 2 BLNT TIP (CANNULA) IMPLANT
CATH ANGIO 5F BER 65CM (CATHETERS) ×4 IMPLANT
CATH CROSS OVER TEMPO 5F (CATHETERS) ×4 IMPLANT
CATH CXI SUPP ANG 2.6FR 150CM (MICROCATHETER) IMPLANT
CATH EMB 3FR 80CM (CATHETERS) ×8 IMPLANT
CATH EMB 4FR 80CM (CATHETERS) ×4 IMPLANT
CATH EMB 5FR 80CM (CATHETERS) IMPLANT
CATH MUSTANG 3X80X135 (BALLOONS) ×4 IMPLANT
CATH OMNI FLUSH .035X70CM (CATHETERS) ×4 IMPLANT
CATH QUICKCROSS SUPP .035X90CM (MICROCATHETER) ×4 IMPLANT
CLIP TI MEDIUM 24 (CLIP) ×4 IMPLANT
CLIP TI WIDE RED SMALL 24 (CLIP) ×4 IMPLANT
COVER DOME SNAP 22 D (MISCELLANEOUS) ×12 IMPLANT
COVER PROBE W GEL 5X96 (DRAPES) ×4 IMPLANT
COVER SURGICAL LIGHT HANDLE (MISCELLANEOUS) ×4 IMPLANT
CUFF TOURNIQUET SINGLE 24IN (TOURNIQUET CUFF) ×4 IMPLANT
CUFF TOURNIQUET SINGLE 34IN LL (TOURNIQUET CUFF) IMPLANT
CUFF TOURNIQUET SINGLE 44IN (TOURNIQUET CUFF) IMPLANT
DERMABOND ADVANCED (GAUZE/BANDAGES/DRESSINGS) ×1
DERMABOND ADVANCED .7 DNX12 (GAUZE/BANDAGES/DRESSINGS) ×3 IMPLANT
DEVICE TORQUE KENDALL .025-038 (MISCELLANEOUS) ×4 IMPLANT
DRAIN CHANNEL 15F RND FF W/TCR (WOUND CARE) IMPLANT
DRAPE PROXIMA HALF (DRAPES) IMPLANT
DRAPE X-RAY CASS 24X20 (DRAPES) IMPLANT
DRSG COVADERM 4X10 (GAUZE/BANDAGES/DRESSINGS) IMPLANT
DRSG COVADERM 4X8 (GAUZE/BANDAGES/DRESSINGS) IMPLANT
DRSG TEGADERM 4X4.75 (GAUZE/BANDAGES/DRESSINGS) ×4 IMPLANT
DRSG VAC ATS MED SENSATRAC (GAUZE/BANDAGES/DRESSINGS) ×4 IMPLANT
ELECT REM PT RETURN 9FT ADLT (ELECTROSURGICAL) ×4
ELECTRODE REM PT RTRN 9FT ADLT (ELECTROSURGICAL) ×3 IMPLANT
EVACUATOR SILICONE 100CC (DRAIN) IMPLANT
GAUZE SPONGE 2X2 8PLY STRL LF (GAUZE/BANDAGES/DRESSINGS) ×3 IMPLANT
GLOVE BIO SURGEON STRL SZ 6.5 (GLOVE) ×8 IMPLANT
GLOVE BIO SURGEON STRL SZ7.5 (GLOVE) ×4 IMPLANT
GLOVE BIOGEL PI IND STRL 6.5 (GLOVE) ×15 IMPLANT
GLOVE BIOGEL PI IND STRL 7.0 (GLOVE) ×6 IMPLANT
GLOVE BIOGEL PI IND STRL 7.5 (GLOVE) ×12 IMPLANT
GLOVE BIOGEL PI INDICATOR 6.5 (GLOVE) ×5
GLOVE BIOGEL PI INDICATOR 7.0 (GLOVE) ×2
GLOVE BIOGEL PI INDICATOR 7.5 (GLOVE) ×4
GLOVE SKINSENSE NS SZ7.5 (GLOVE) ×3
GLOVE SKINSENSE STRL SZ7.5 (GLOVE) ×9 IMPLANT
GLOVE SURG SS PI 7.5 STRL IVOR (GLOVE) ×4 IMPLANT
GOWN STRL REUS W/ TWL LRG LVL3 (GOWN DISPOSABLE) ×15 IMPLANT
GOWN STRL REUS W/ TWL XL LVL3 (GOWN DISPOSABLE) ×3 IMPLANT
GOWN STRL REUS W/TWL LRG LVL3 (GOWN DISPOSABLE) ×5
GOWN STRL REUS W/TWL XL LVL3 (GOWN DISPOSABLE) ×1
GUIDEWIRE ANGLED .035X150CM (WIRE) ×4 IMPLANT
GUIDEWIRE ANGLED .035X260CM (WIRE) ×4 IMPLANT
HEMOSTAT SNOW SURGICEL 2X4 (HEMOSTASIS) ×4 IMPLANT
INSERT FOGARTY SM (MISCELLANEOUS) IMPLANT
KIT BASIN OR (CUSTOM PROCEDURE TRAY) ×4 IMPLANT
KIT ENCORE 26 ADVANTAGE (KITS) ×8 IMPLANT
KIT ROOM TURNOVER OR (KITS) ×4 IMPLANT
MARKER GRAFT CORONARY BYPASS (MISCELLANEOUS) IMPLANT
NEEDLE PERC 18GX7CM (NEEDLE) ×4 IMPLANT
NS IRRIG 1000ML POUR BTL (IV SOLUTION) ×8 IMPLANT
PACK GENERAL/GYN (CUSTOM PROCEDURE TRAY) IMPLANT
PACK PERIPHERAL VASCULAR (CUSTOM PROCEDURE TRAY) ×4 IMPLANT
PAD ARMBOARD 7.5X6 YLW CONV (MISCELLANEOUS) ×8 IMPLANT
PADDING CAST ABS 4INX4YD NS (CAST SUPPLIES) ×1
PADDING CAST ABS 6INX4YD NS (CAST SUPPLIES)
PADDING CAST ABS COTTON 4X4 ST (CAST SUPPLIES) ×3 IMPLANT
PADDING CAST ABS COTTON 6X4 NS (CAST SUPPLIES) IMPLANT
PATCH VASC XENOSURE 1CMX6CM (Vascular Products) ×1 IMPLANT
PATCH VASC XENOSURE 1X6 (Vascular Products) ×3 IMPLANT
SET COLLECT BLD 21X3/4 12 (NEEDLE) IMPLANT
SET COLLECT BLD 21X3/4 12 PB (MISCELLANEOUS) ×4 IMPLANT
SET MICROPUNCTURE 5F STIFF (MISCELLANEOUS) IMPLANT
SHEATH AVANTI 11CM 5FR (MISCELLANEOUS) ×8 IMPLANT
SHEATH BRITE TIP 7FR 35CM (SHEATH) ×8 IMPLANT
SHEATH BRITE TIP 7FRX11 (SHEATH) ×8 IMPLANT
SNARE GOOSENECK 10MM (VASCULAR PRODUCTS) ×4 IMPLANT
SPONGE GAUZE 2X2 STER 10/PKG (GAUZE/BANDAGES/DRESSINGS) ×1
SPONGE LAP 18X18 X RAY DECT (DISPOSABLE) ×4 IMPLANT
STENT ICAST 7X38X120 (Permanent Stent) ×4 IMPLANT
STENT ICAST 7X59X120 (Permanent Stent) ×4 IMPLANT
STENT ICAST 7X59X80 (Permanent Stent) ×4 IMPLANT
STENT VIABAHN 5X100X120 (Permanent Stent) ×4 IMPLANT
STOPCOCK 4 WAY LG BORE MALE ST (IV SETS) ×4 IMPLANT
STOPCOCK MORSE 400PSI 3WAY (MISCELLANEOUS) IMPLANT
SUT ETHILON 3 0 PS 1 (SUTURE) IMPLANT
SUT GORETEX 6.0 TT13 (SUTURE) IMPLANT
SUT GORETEX 6.0 TT9 (SUTURE) IMPLANT
SUT MNCRL AB 4-0 PS2 18 (SUTURE) ×4 IMPLANT
SUT PROLENE 5 0 C 1 24 (SUTURE) ×16 IMPLANT
SUT PROLENE 5 0 C 1 36 (SUTURE) ×4 IMPLANT
SUT PROLENE 6 0 BV (SUTURE) ×32 IMPLANT
SUT PROLENE 7 0 BV 1 (SUTURE) IMPLANT
SUT SILK 2 0 SH (SUTURE) IMPLANT
SUT SILK 3 0 (SUTURE)
SUT SILK 3-0 18XBRD TIE 12 (SUTURE) IMPLANT
SUT VIC AB 2-0 CT1 27 (SUTURE) ×1
SUT VIC AB 2-0 CT1 TAPERPNT 27 (SUTURE) ×3 IMPLANT
SUT VIC AB 3-0 SH 27 (SUTURE) ×1
SUT VIC AB 3-0 SH 27X BRD (SUTURE) ×3 IMPLANT
SYR 20CC LL (SYRINGE) ×4 IMPLANT
SYR 30ML LL (SYRINGE) ×4 IMPLANT
SYR 3ML LL SCALE MARK (SYRINGE) IMPLANT
SYR MEDRAD MARK V 150ML (SYRINGE) IMPLANT
SYR TB 1ML LUER SLIP (SYRINGE) ×4 IMPLANT
SYRINGE 10CC LL (SYRINGE) ×8 IMPLANT
SYRINGE 1CC SLIP TB (MISCELLANEOUS) ×4 IMPLANT
SYRINGE 3CC LL L/F (MISCELLANEOUS) ×4 IMPLANT
TAPE UMBILICAL COTTON 1/8X30 (MISCELLANEOUS) IMPLANT
TRAY FOLEY W/METER SILVER 16FR (SET/KITS/TRAYS/PACK) ×4 IMPLANT
TUBING EXTENTION W/L.L. (IV SETS) ×4 IMPLANT
TUBING HIGH PRESSURE 120CM (CONNECTOR) IMPLANT
UNDERPAD 30X30 (UNDERPADS AND DIAPERS) ×4 IMPLANT
WATER STERILE IRR 1000ML POUR (IV SOLUTION) ×4 IMPLANT
WIRE AMPLATZ SS-J .035X180CM (WIRE) ×8 IMPLANT
WIRE BENTSON .035X145CM (WIRE) ×16 IMPLANT
WIRE MINI STICK MAX (SHEATH) ×4 IMPLANT
WIRE ROSEN-J .035X260CM (WIRE) ×8 IMPLANT
WIRE SPARTACORE .014X300CM (WIRE) ×4 IMPLANT

## 2017-01-08 SURGICAL SUPPLY — 79 items
BAG SNAP BAND KOVER 36X36 (MISCELLANEOUS) ×2 IMPLANT
BANDAGE ACE 4X5 VEL STRL LF (GAUZE/BANDAGES/DRESSINGS) IMPLANT
BANDAGE ESMARK 6X9 LF (GAUZE/BANDAGES/DRESSINGS) IMPLANT
BLADE SURG 11 STRL SS (BLADE) IMPLANT
BNDG ESMARK 6X9 LF (GAUZE/BANDAGES/DRESSINGS)
BNDG GAUZE ELAST 4 BULKY (GAUZE/BANDAGES/DRESSINGS) ×2 IMPLANT
CANISTER SUCTION 2500CC (MISCELLANEOUS) ×2 IMPLANT
CATH EMB 2FR 60CM (CATHETERS) ×2 IMPLANT
CATH EMB 3FR 40CM (CATHETERS) ×2 IMPLANT
CATH EMB 3FR 80CM (CATHETERS) ×2 IMPLANT
CATH EMB 4FR 80CM (CATHETERS) IMPLANT
CATH EMB 5FR 80CM (CATHETERS) IMPLANT
CATH OMNI FLUSH .035X70CM (CATHETERS) IMPLANT
CLIP TI MEDIUM 24 (CLIP) ×2 IMPLANT
CLIP TI WIDE RED SMALL 24 (CLIP) ×2 IMPLANT
COVER BACK TABLE 80X110 HD (DRAPES) ×2 IMPLANT
COVER DOME SNAP 22 D (MISCELLANEOUS) ×2 IMPLANT
COVER PROBE W GEL 5X96 (DRAPES) ×2 IMPLANT
COVER SURGICAL LIGHT HANDLE (MISCELLANEOUS) ×2 IMPLANT
CUFF TOURNIQUET SINGLE 24IN (TOURNIQUET CUFF) IMPLANT
CUFF TOURNIQUET SINGLE 34IN LL (TOURNIQUET CUFF) IMPLANT
CUFF TOURNIQUET SINGLE 44IN (TOURNIQUET CUFF) IMPLANT
DERMABOND ADVANCED (GAUZE/BANDAGES/DRESSINGS) ×1
DERMABOND ADVANCED .7 DNX12 (GAUZE/BANDAGES/DRESSINGS) ×1 IMPLANT
DRAIN CHANNEL 15F RND FF W/TCR (WOUND CARE) IMPLANT
DRAPE INCISE IOBAN 66X45 STRL (DRAPES) ×2 IMPLANT
DRAPE X-RAY CASS 24X20 (DRAPES) IMPLANT
DRSG COVADERM 4X10 (GAUZE/BANDAGES/DRESSINGS) IMPLANT
DRSG COVADERM 4X8 (GAUZE/BANDAGES/DRESSINGS) IMPLANT
ELECT REM PT RETURN 9FT ADLT (ELECTROSURGICAL) ×2
ELECTRODE REM PT RTRN 9FT ADLT (ELECTROSURGICAL) ×1 IMPLANT
EVACUATOR SILICONE 100CC (DRAIN) IMPLANT
GLOVE BIO SURGEON STRL SZ7.5 (GLOVE) ×2 IMPLANT
GOWN STRL REUS W/ TWL LRG LVL3 (GOWN DISPOSABLE) ×2 IMPLANT
GOWN STRL REUS W/ TWL XL LVL3 (GOWN DISPOSABLE) ×1 IMPLANT
GOWN STRL REUS W/TWL LRG LVL3 (GOWN DISPOSABLE) ×2
GOWN STRL REUS W/TWL XL LVL3 (GOWN DISPOSABLE) ×1
HEMOSTAT SNOW SURGICEL 2X4 (HEMOSTASIS) IMPLANT
KIT BASIN OR (CUSTOM PROCEDURE TRAY) ×2 IMPLANT
KIT ROOM TURNOVER OR (KITS) ×2 IMPLANT
MARKER GRAFT CORONARY BYPASS (MISCELLANEOUS) IMPLANT
NEEDLE PERC 18GX7CM (NEEDLE) ×2 IMPLANT
NS IRRIG 1000ML POUR BTL (IV SOLUTION) ×4 IMPLANT
PACK GENERAL/GYN (CUSTOM PROCEDURE TRAY) IMPLANT
PACK PERIPHERAL VASCULAR (CUSTOM PROCEDURE TRAY) ×2 IMPLANT
PAD ARMBOARD 7.5X6 YLW CONV (MISCELLANEOUS) ×4 IMPLANT
PADDING CAST ABS 6INX4YD NS (CAST SUPPLIES)
PADDING CAST ABS COTTON 6X4 NS (CAST SUPPLIES) IMPLANT
SET COLLECT BLD 21X3/4 12 (NEEDLE) IMPLANT
SET MICROPUNCTURE 5F STIFF (MISCELLANEOUS) IMPLANT
SHEATH AVANTI 11CM 5FR (MISCELLANEOUS) ×2 IMPLANT
SPONGE GAUZE 4X4 12PLY STER LF (GAUZE/BANDAGES/DRESSINGS) ×2 IMPLANT
STOPCOCK 4 WAY LG BORE MALE ST (IV SETS) IMPLANT
STOPCOCK MORSE 400PSI 3WAY (MISCELLANEOUS) IMPLANT
SUT ETHILON 3 0 PS 1 (SUTURE) ×2 IMPLANT
SUT MNCRL AB 4-0 PS2 18 (SUTURE) ×2 IMPLANT
SUT PROLENE 5 0 C 1 24 (SUTURE) ×4 IMPLANT
SUT PROLENE 6 0 BV (SUTURE) ×2 IMPLANT
SUT PROLENE 7 0 BV 1 (SUTURE) ×2 IMPLANT
SUT SILK 2 0 SH (SUTURE) IMPLANT
SUT SILK 3 0 (SUTURE)
SUT SILK 3-0 18XBRD TIE 12 (SUTURE) IMPLANT
SUT VIC AB 2-0 CT1 27 (SUTURE) ×1
SUT VIC AB 2-0 CT1 TAPERPNT 27 (SUTURE) ×1 IMPLANT
SUT VIC AB 3-0 SH 27 (SUTURE) ×1
SUT VIC AB 3-0 SH 27X BRD (SUTURE) ×1 IMPLANT
SYR 20CC LL (SYRINGE) IMPLANT
SYR 30ML LL (SYRINGE) IMPLANT
SYR 3ML LL SCALE MARK (SYRINGE) IMPLANT
SYR MEDRAD MARK V 150ML (SYRINGE) IMPLANT
SYR TB 1ML LUER SLIP (SYRINGE) ×2 IMPLANT
SYRINGE 10CC LL (SYRINGE) ×6 IMPLANT
SYRINGE 1CC SLIP TB (MISCELLANEOUS) ×2 IMPLANT
TRAY FOLEY W/METER SILVER 16FR (SET/KITS/TRAYS/PACK) IMPLANT
TUBING EXTENTION W/L.L. (IV SETS) IMPLANT
TUBING HIGH PRESSURE 120CM (CONNECTOR) IMPLANT
UNDERPAD 30X30 (UNDERPADS AND DIAPERS) ×2 IMPLANT
WATER STERILE IRR 1000ML POUR (IV SOLUTION) ×2 IMPLANT
WIRE BENTSON .035X145CM (WIRE) ×2 IMPLANT

## 2017-01-08 NOTE — Anesthesia Procedure Notes (Deleted)
Procedures

## 2017-01-08 NOTE — Transfer of Care (Signed)
Immediate Anesthesia Transfer of Care Note  Patient: Alison Lynch  Procedure(s) Performed: Procedure(s): LOWER EXTREMITY ANGIOGRAM; Exposure of Left Dorsal Pedialis (Left)  Patient Location: PACU  Anesthesia Type:General  Level of Consciousness: awake  Airway & Oxygen Therapy: Patient Spontanous Breathing and Patient connected to face mask oxygen  Post-op Assessment: Report given to RN and Post -op Vital signs reviewed and stable  Post vital signs: Reviewed and stable  Last Vitals:  Vitals:   01/08/17 1720 01/08/17 1730  BP: 123/87   Pulse: 94 88  Resp: 17 19  Temp: 36.4 C     Last Pain:  Vitals:   01/08/17 1720  TempSrc:   PainSc: 7          Complications: No apparent anesthesia complications

## 2017-01-08 NOTE — Transfer of Care (Signed)
Immediate Anesthesia Transfer of Care Note  Patient: Alison Lynch  Procedure(s) Performed: Procedure(s): Ultrasound Guided Cannulation Left Common Femoral Artery; Aortagram (Left) Bilateral Lower Extremity Angiogram (Bilateral) Bilateral Common Iliac Stent and Left Popliteal Artery Stent (Bilateral) Left Common Femoral Artery Exploration (Left) Left Lower Extremity Thromboembolectomy (Left) Left Popliteal Endarterectomy (Left) Patch Angioplasty Left Popliteal Artery   Patient Location: PACU  Anesthesia Type:General  Level of Consciousness: oriented and patient cooperative, drowsy  Airway & Oxygen Therapy: Patient Spontanous Breathing and Patient connected to nasal cannula oxygen  Post-op Assessment: Report given to RN and Post -op Vital signs reviewed and stable  Post vital signs: Reviewed and stable  Last Vitals:  Vitals:   01/08/17 0630 01/08/17 0700  BP: 142/74 (!) 131/107  Pulse: 109 112  Resp: 21 14  Temp:      Last Pain:  Vitals:   01/08/17 0717  TempSrc:   PainSc: 8          Complications: No apparent anesthesia complications

## 2017-01-08 NOTE — Anesthesia Preprocedure Evaluation (Addendum)
Anesthesia Evaluation  Patient identified by MRN, date of birth, ID band Patient awake    Reviewed: Allergy & Precautions, NPO status , Patient's Chart, lab work & pertinent test results  History of Anesthesia Complications (+) DIFFICULT AIRWAY  Airway Mallampati: III  TM Distance: >3 FB Neck ROM: Limited    Dental  (+) Missing, Poor Dentition, Chipped, Dental Advisory Given   Pulmonary Current Smoker,    Pulmonary exam normal breath sounds clear to auscultation       Cardiovascular + Peripheral Vascular Disease  Normal cardiovascular exam Rhythm:Regular Rate:Normal     Neuro/Psych negative neurological ROS  negative psych ROS   GI/Hepatic negative GI ROS, Neg liver ROS,   Endo/Other  negative endocrine ROS  Renal/GU negative Renal ROS  negative genitourinary   Musculoskeletal negative musculoskeletal ROS (+)   Abdominal   Peds negative pediatric ROS (+)  Hematology negative hematology ROS (+)   Anesthesia Other Findings   Reproductive/Obstetrics negative OB ROS                            Anesthesia Physical Anesthesia Plan  ASA: III  Anesthesia Plan: General   Post-op Pain Management:    Induction: Intravenous  Airway Management Planned: Oral ETT and Video Laryngoscope Planned  Additional Equipment: Arterial line  Intra-op Plan:   Post-operative Plan: Possible Post-op intubation/ventilation  Informed Consent: I have reviewed the patients History and Physical, chart, labs and discussed the procedure including the risks, benefits and alternatives for the proposed anesthesia with the patient or authorized representative who has indicated his/her understanding and acceptance.   Dental advisory given  Plan Discussed with: CRNA and Surgeon  Anesthesia Plan Comments:        Anesthesia Quick Evaluation

## 2017-01-08 NOTE — ED Notes (Signed)
Pt has pulled out her L AC IV at this time due to moving around in bed.

## 2017-01-08 NOTE — Anesthesia Postprocedure Evaluation (Signed)
Anesthesia Post Note  Patient: Alison Lynch  Procedure(s) Performed: Procedure(s) (LRB): LOWER EXTREMITY ANGIOGRAM; Exposure of Left Dorsal Pedialis (Left)  Patient location during evaluation: PACU Anesthesia Type: General Level of consciousness: awake and alert Pain management: pain level controlled Vital Signs Assessment: post-procedure vital signs reviewed and stable Respiratory status: spontaneous breathing, nonlabored ventilation, respiratory function stable and patient connected to nasal cannula oxygen Cardiovascular status: blood pressure returned to baseline and stable Postop Assessment: no signs of nausea or vomiting Anesthetic complications: no       Last Vitals:  Vitals:   01/08/17 2245 01/08/17 2300  BP:    Pulse: 92 93  Resp: (!) 21 (!) 24  Temp:      Last Pain:  Vitals:   01/08/17 2115  TempSrc:   PainSc: 5                  Joie Reamer S

## 2017-01-08 NOTE — Op Note (Signed)
Patient name: Alison Lynch MRN: [id number] DOB: 08/27/1961 Sex: female  01/08/2017 Pre-operative Diagnosis: acute left lower extremity ischemia Post-operative diagnosis:  Same Surgeon:  Apolinar JunesBrandon C. Randie Heinzain, MD Assistant: Karsten RoKim Trinh, PA Procedure Performed: 1.  US guided cannulation of Right common femoral artery 2.  Open exposure of Left common femoral and below knee popliteal arteries 3.  Aortogram with bilateral lower extremity runoff 4.  Bilateral common iliac stents with 7mm Icast in kissing fashion and extended on right to level of hypogastric artery 5.  Left iliofemoral embolectomy 6.  Left lower extremity embolectomy 7.  Endarterectomy of left below knee popliteal and tibioperoneal trunk with bovine pericardial patch angioplasty 8.  Stent of left popliteal artery with 5 x 10 viabahn  9.  Left leg 4 compartment fasciotomy with placement of wound vac   Indications:  56 year old female who presented as a transfer to our emergency department with a one-day history of painful and numb left lower extremity. She never had any symptoms like that before. She was found to not have any palpable pulses in her bilateral lower extremities on exam by bedside duplex did have patent common femoral arteries bilaterally. She was therefore indicated for revascularization of the left lower extremity and evaluation of the right.  Findings: There is a chronic aortic occlusion and there was some acute thrombus on the left. The left lower extremity there was substantial acute thrombus removed in a retrograde fashion from the common femoral artery. Below the knee there was chronically occluded tibioperoneal trunk with single-vessel runoff via an anterior tibial artery. We were able to stent the chronically occluded aorta and reestablished flow to the bilateral common femoral arteries. On the right she has dominant runoff via the peroneal artery to the level of the ankle. On the left side we established runoff via the  anterior tibial artery to the level of the ankle and it was palpable in the mid calf.   Procedure:  The patient was identified in the holding area and taken to the operating room where she was placed supine on the operating table and general endotracheal anesthesia was induced she was sterilely prepped and draped in her bilateral groins left lower extremity in usual fashion and timeout called. We began by using US to identify the right common femoral artery and cannulated with micropuncture needle and placed a micropuncture sheath. We then exchanged for 5 French sheath. We did have trouble passing a wire and a catheter. For this reason and the fact that she had no palpable pulses I elected to then cut down on the left common femoral artery. A vertical incision was made and we dissected down onto the common femoral artery placed vessel loops around it as well as the profunda and SFA and small branches. This time the patient was heparinized and remained heparinized throughout the course of this operation. There was no strong pulsatility within the common femoral we had cut down on so we cannulated with 18-gauge needle placed a Bentson wire exchanged for a 5 French sheath on the left. Using Glidewire and bare catheter we were able to get into the aorta and perform aortogram which demonstrated chronic occlusion of the right common iliac artery. From the right side sheath we then used a Glidewire Barrett catheter and quick cross catheter because dissection of the aorta and could not get back into the true lumen. Ultimately from the left side we were able to use an Omni and get up and over the bifurcation with  a Glidewire. This was snared from the right side and we then performed a body floss technique from femoral to femoral. Over the body floss wire we then introduced 2 long 7 French sheath into the aorta and confirmed location with retrograde aortogram. Through the sheath we then brought 7 x 59 I cast stents and  retracted our sheaths and deployed them at the level of the bifurcation. We then performed aortogram from the left side which demonstrated residual thrombus at the level of the hypo-and external iliac artery occluding on the left. We then made an arteriotomy pulled the thrombus out and close the arteriotomy. On the right side there was a dissection at the level of the distal common iliac artery where we had attempted to cross the lesion and we then extended with another 7 I cast stent to the level of the hypogastric artery and aortogram demonstrated brisk flow with no residual stenoses were we were once occluded. Inflow now established we turned our stent attention to the leg. Through our previous embolectomy site we reopened and performed an bullectomy of the left lower extremity and removed significant amount of thrombus. We do not have much backbleeding from the superficial femoral artery. We then performed left lower extremity angiogram demonstrated cut off at the level of the popliteal just at the knee where it was occluded and there was no significant runoff below the knee. We then exposed our below-knee popliteal artery and unfortunately had injuries to the venous system requiring ligation of some lower tibial veins with ties. We cannot get control initially and placed a tourniquet up for just a few minutes in order to get control of the venous system. After this we exposed our below-knee popliteal artery onto her anterior tibial made a transverse arteriotomy and performed embolectomy in both directions. We did establish brisk inflow could not get significant outflow although there was some backbleeding from the anterior tibial artery we could not pass the tibioperoneal trunk. We then closed arteriotomy again performed angiogram from the left groin but it appeared there was an occlusion at the level of the popliteal still and no significant runoff. We then opened our below-knee popliteal onto her tibial  peroneal trunk and performed endarterectomy there. We can perform embolectomy of her anterior tibial artery now able to see it much better and did have significant back flow. These vessels were all then clamped and we will perform patch angioplasty with bovine pericardium trimmed to size and sewn in place with 6-0 Prolene suture. At completion we did have a signal at the level of the popliteal but nothing at the anterior tibial at the ankle. Angiogram again demonstrated what appeared to be dissection of the popliteal artery at the level of the knee. We then placed a 7 French sheath in the antegrade direction crossed the lesion with a Glidewire confirmed intraluminal exchanged for an 014 wire and then brought a 5 x 10 cm viabahn stent in place and deployed it. This was postdilated with a 3 mm balloon. We now had brisk runoff to the level of the ankle via the anterior tibial artery. Satisfied with this we then administered 25 mg of protamine. 4 compartment fasciotomies were performed in the left lower extremity. Her existing incision medially was extended and the fascia of the deep and superficial muscles were then released as well. We will left our soleal bridge intact as the muscle was not that tends and did appear marginally viable with response to electrocautery. Laterally we made a  similar longitudinal incision with separate incisions on our fascia for the anterior lateral compartments. Hemostasis was obtained and a wound VAC placed to both fasciotomy sites. In the left groin we closed our sheath sites with 5-0 Prolene suture. We then obtained hemostasis in that wound irrigated and closed in several layers with 20 and 3-0 Vicryl for Monocryl at the level of the skin. The sheath in the right groin was pulled and pressure held for approximately 20 minutes until hemostasis obtained. Patient was allowed awaken from anesthesia having tolerated the procedure well  Contrast 165 mL.  Blood loss 850 mL.  Blood  administered 3 units.  Crystalloid administered 4 L    Dedra Matsuo C. Randie Heinz, MD [facility] Office: [phone] Pager: [phone]

## 2017-01-08 NOTE — Anesthesia Procedure Notes (Signed)
Procedures

## 2017-01-08 NOTE — Progress Notes (Signed)
ANTICOAGULATION CONSULT NOTE - Initial Consult  Pharmacy Consult for heparin Indication: ischemic leg  No Known Allergies  Patient Measurements: Height: 5\' 1"  (154.9 cm) Weight: 114 lb (51.7 kg) IBW/kg (Calculated) : 47.8  Vital Signs: Temp: 98.1 F (36.7 C) (02/10 0422) Temp Source: Oral (02/10 0422) BP: 131/107 (02/10 0700) Pulse Rate: 112 (02/10 0700)  Labs:  Recent Labs  01/08/17 0426  HGB 13.0  HCT 37.9  PLT 238  APTT 50*  LABPROT 14.7  INR 1.14  CREATININE 1.61*  CKTOTAL 1,992*    Estimated Creatinine Clearance: 30.5 mL/min (by C-G formula based on SCr of 1.61 mg/dL (H)).   Medical History: Past Medical History:  Diagnosis Date  . Panic attacks   . PTSD (post-traumatic stress disorder)      Assessment: 56yo female [facility] c/o LLE pain, started on heparin at OSH and tx'd to [facility] vascular surgery consult, awaiting aortogram in OR, likely will need embolectomy w/ possible bypass and fasciotomies, to continue heparin for now.  Goal of Therapy:  Heparin level 0.3-0.7 units/ml Monitor platelets by anticoagulation protocol: Yes   Plan:  OSH gave heparin 3000 units x1 followed by gtt at 750/hr started this am at 0230; will continue for now and monitor heparin levels and CBC.  Vernard GamblesVeronda Jobani Sabado, PharmD, BCPS  01/08/2017,7:19 AM

## 2017-01-08 NOTE — Anesthesia Postprocedure Evaluation (Signed)
Anesthesia Post Note  Patient: Alison Lynch  Procedure(s) Performed: Procedure(s) (LRB): Ultrasound Guided Cannulation Left Common Femoral Artery; Aortagram (Left) Bilateral Lower Extremity Angiogram (Bilateral) Bilateral Common Iliac Stent and Left Popliteal Artery Stent (Bilateral) Left Common Femoral Artery Exploration (Left) Left Lower Extremity Thromboembolectomy (Left) Left Popliteal Endarterectomy (Left) Patch Angioplasty Left Popliteal Artery   Patient location during evaluation: PACU Anesthesia Type: General Level of consciousness: awake and alert Pain management: pain level controlled Vital Signs Assessment: post-procedure vital signs reviewed and stable Respiratory status: spontaneous breathing, nonlabored ventilation, respiratory function stable and patient connected to nasal cannula oxygen Cardiovascular status: blood pressure returned to baseline and stable Postop Assessment: no signs of nausea or vomiting Anesthetic complications: no Comments: No pulses in pacu pedal, will be going back to OR       Last Vitals:  Vitals:   01/08/17 1633 01/08/17 1645  BP: 90/61   Pulse: 87 85  Resp: (!) 25 (!) 22  Temp:      Last Pain:  Vitals:   01/08/17 0717  TempSrc:   PainSc: 8                  [name]

## 2017-01-08 NOTE — H&P (Signed)
Hospital Consult    Reason for Consult:  Cold and painful left foot Referring Physician:  Criss Alvine MRN #:  [id number]  History of Present Illness: This is a 56 y.o. female with past history of anxiety, ckd 3 and a long-term smoker but otherwise without significant past medical history. She had a recent cut to her right hand and was having stitches removed when she became nauseated today and then had a Phenergan shot in her left buttocks. Following that she returned home around noon and began having pain in her entire left lower extremity. She attempted to take a bath and sleep but did not alleviate the pain. Ultimately she cannot move the left lower extremity presented to an outside hospital where we were contacted. Heparin has been started she states this has allowed her to regain some motor function but the foot remains numb and left lower extremity is painful. She has never had anything like this before. States that at baseline she can walk without lower extremity limitation. She does have occasional chest pains is never seen a cardiologist.  Past Medical History:  Diagnosis Date  . Panic attacks   . PTSD (post-traumatic stress disorder)     History reviewed. No pertinent surgical history.  No Known Allergies  Prior to Admission medications   Medication Sig Start Date End Date Taking? Authorizing Provider  gabapentin (NEURONTIN) 300 MG capsule Take 300 mg by mouth 3 (three) times daily. 08/24/16 08/24/17 Yes Historical Provider, MD    Social History   Social History  . Marital status: Married    Spouse name: N/A  . Number of children: N/A  . Years of education: N/A   Occupational History  . Not on file.   Social History Main Topics  . Smoking status: Current Every Day Smoker  . Smokeless tobacco: Never Used  . Alcohol use No  . Drug use: No  . Sexual activity: Not on file   Other Topics Concern  . Not on file   Social History Narrative  . No narrative on file      History reviewed. No pertinent family history.  ROS: [x]  Positive   [ ]  Negative   [ ]  All sytems reviewed and are negative  Cardiovascular: [x]  chest pain/pressure []  palpitations []  SOB lying flat []  DOE []  pain in legs while walking [x]  pain in legs at rest []  pain in legs at night []  non-healing ulcers []  hx of DVT []  swelling in legs  Pulmonary: []  productive cough []  asthma/wheezing []  home O2  Neurologic: []  weakness in []  arms []  legs [x]  numbness in []  arms [x]  legs []  hx of CVA []  mini stroke [] difficulty speaking or slurred speech []  temporary loss of vision in one eye []  dizziness  Hematologic: []  hx of cancer []  bleeding problems []  problems with blood clotting easily  Endocrine:   []  diabetes []  thyroid disease  GI []  vomiting blood []  blood in stool  GU: [x]  CKD/renal failure []  HD--[]  M/W/F or []  T/T/S []  burning with urination []  blood in urine  Psychiatric: [x]  anxiety []  depression  Musculoskeletal: []  arthritis []  joint pain  Integumentary: []  rashes []  ulcers  Constitutional: []  fever []  chills   Physical Examination  Vitals:   01/08/17 0515 01/08/17 0530  BP: 151/84 161/72  Pulse: 104 111  Resp: 15   Temp:     Body mass index is 21.54 kg/m.  General:  WDWN in NAD HENT: WNL, normocephalic Pulmonary: normal non-labored breathing Cardiac: common  femoral signals but I cannot palpate femoral pulses Abdomen: soft, NT/ND, no masses Skin: mottling of left foot and leg to mid-calf Musculoskeletal: no muscle wasting or atrophy  Neurologic: left leg is numb, minimal motor with severely decreased stength Psychiatric:  Very anxious  CBC    Component Value Date/Time   WBC 17.1 (H) 01/08/2017 0426   RBC 4.30 01/08/2017 0426   HGB 13.0 01/08/2017 0426   HCT 37.9 01/08/2017 0426   PLT 238 01/08/2017 0426   MCV 88.1 01/08/2017 0426   MCH 30.2 01/08/2017 0426   MCHC 34.3 01/08/2017 0426   RDW 12.8 01/08/2017 0426    LYMPHSABS 1.9 01/08/2017 0426   MONOABS 0.6 01/08/2017 0426   EOSABS 0.0 01/08/2017 0426   BASOSABS 0.0 01/08/2017 0426    BMET    Component Value Date/Time   NA 135 01/08/2017 0426   K 3.5 01/08/2017 0426   CL 107 01/08/2017 0426   CO2 13 (L) 01/08/2017 0426   GLUCOSE 107 (H) 01/08/2017 0426   BUN 23 (H) 01/08/2017 0426   CREATININE 1.61 (H) 01/08/2017 0426   CALCIUM 8.2 (L) 01/08/2017 0426   GFRNONAA 36 (L) 01/08/2017 0426   GFRAA 41 (L) 01/08/2017 0426    COAGS: Lab Results  Component Value Date   INR 1.14 01/08/2017     Non-Invasive Vascular Imaging:   Bedside duplex demonstrates flow in the bilateral common femoral arteries without significant disease   ASSESSMENT/PLAN: This is a 56 y.o. female here with [place] to be ischemia of her left foot. Given her baseline chronic kidney disease we will take her to the operating room for aortogram with left lower extremity runoff to limit the contrast. She will likely need open embolectomy with possible bypass and likely 4 compartment fasciotomies of her left lower extremity given her elevated CK and the time course of this process. I've consented her for all of this as well as given her at least a 50% chance of amputation while inpatient. She does have some baseline occasional chest pain and I have also told her she will be at high risk from a cardiac standpoint for operation. She is tearful throughout the exam but is willing to proceed in an effort to save her left leg.  Jenavee Laguardia C. Randie Heinzain, MD [facility] [place] Office: [phone] Pager: [phone]

## 2017-01-08 NOTE — ED Triage Notes (Signed)
Pt arrived via [facility] transferred [facility] c/o left leg pain and ischemia after a phenergan shot this morning.

## 2017-01-08 NOTE — Anesthesia Procedure Notes (Signed)
Procedure Name: Intubation Date/Time: 01/08/2017 10:10 AM Performed by: Daiva EvesAVENEL, Camelle Henkels W Pre-anesthesia Checklist: Patient identified, Emergency Drugs available, Suction available, Patient being monitored and Timeout performed Patient Re-evaluated:Patient Re-evaluated prior to inductionOxygen Delivery Method: Circle system utilized Preoxygenation: Pre-oxygenation with 100% oxygen Intubation Type: IV induction Ventilation: Mask ventilation without difficulty Laryngoscope Size: Glidescope and 3 Grade View: Grade I Tube type: Oral Tube size: 7.0 mm Number of attempts: 1 Airway Equipment and Method: Stylet and Video-laryngoscopy Placement Confirmation: ETT inserted through vocal cords under direct vision,  positive ETCO2 and breath sounds checked- equal and bilateral Secured at: 22 cm Tube secured with: Tape Dental Injury: Teeth and Oropharynx as per pre-operative assessment  Difficulty Due To: Difficulty was anticipated, Difficult Airway- due to reduced neck mobility and Difficult Airway- due to limited oral opening Comments: Mallampati 3/4. Previous cervical spine surgery. Prominent teeth and poor dentition.

## 2017-01-08 NOTE — Anesthesia Procedure Notes (Signed)
Procedure Name: Intubation Date/Time: 01/08/2017 5:45 PM Performed by: Brien MatesMAHONY, Jarrette Dehner D Pre-anesthesia Checklist: Patient identified, Emergency Drugs available, Suction available, Patient being monitored and Timeout performed Patient Re-evaluated:Patient Re-evaluated prior to inductionOxygen Delivery Method: Circle system utilized Preoxygenation: Pre-oxygenation with 100% oxygen Intubation Type: IV induction Ventilation: Mask ventilation without difficulty Laryngoscope Size: Glidescope (Elective) Grade View: Grade I Tube type: Subglottic suction tube Tube size: 7.5 mm Number of attempts: 1 Airway Equipment and Method: Stylet and Video-laryngoscopy Placement Confirmation: ETT inserted through vocal cords under direct vision,  positive ETCO2 and breath sounds checked- equal and bilateral Secured at: 20 cm Tube secured with: Tape Dental Injury: Teeth and Oropharynx as per pre-operative assessment

## 2017-01-08 NOTE — ED Provider Notes (Addendum)
[facility]-EMERGENCY DEPT Provider Note   CSN: [id number] Arrival date & time: 01/08/17  0401     History   Chief Complaint Chief Complaint  Patient presents with  . Ischemia    HPI Alison Lynch is a 56 y.o. female.  HPI  56 year old female presents as a transfer from [facility] for an ischemic left foot. Patient is an overall poor historian and I was not able to talk to EMS that they had already left. Patient states that earlier on 01/07/17 in the afternoon she developed vomiting and diarrhea. Received a Phenergan IM shot in her left buttocks. Went home and then developed left leg pain. This is below knee. She has chronic left numbness but is not worse. However the pain is severe. Found to have an ischemic foot and placed on heparin. Vascular surgery, Dr. Randie Heinzain, was consulted. Currently on heparin. Pain is severe. She states she has a history of PTSD, panic attacks, and hyperlipidemia. No known vascular disease or vascular surgery. Denies known history of A. fib.  Past Medical History:  Diagnosis Date  . Panic attacks   . PTSD (post-traumatic stress disorder)     There are no active problems to display for this patient.   History reviewed. No pertinent surgical history.  OB History    No data available       Home Medications    Prior to Admission medications   Medication Sig Start Date End Date Taking? Authorizing Provider  atorvastatin (LIPITOR) 80 MG tablet Take 80 mg by mouth daily.   Yes Historical Provider, MD  busPIRone (BUSPAR) 10 MG tablet Take 10-15 mg by mouth See admin instructions. Take 1 and 1/2 tablets every morning and take 1 tablet at bedtime   Yes Historical Provider, MD  clindamycin (CLEOCIN) 300 MG capsule Take 300 mg by mouth 3 (three) times daily.   Yes Historical Provider, MD  gabapentin (NEURONTIN) 300 MG capsule Take 300 mg by mouth 3 (three) times daily. 08/24/16 08/24/17 Yes Historical Provider, MD  levothyroxine (SYNTHROID, LEVOTHROID) 50 MCG tablet Take  50 mcg by mouth daily before breakfast.   Yes Historical Provider, MD  methocarbamol (ROBAXIN) 750 MG tablet Take 750 mg by mouth every 8 (eight) hours as needed for muscle spasms.   Yes Historical Provider, MD  sertraline (ZOLOFT) 50 MG tablet Take 50 mg by mouth daily.   Yes Historical Provider, MD    Family History History reviewed. No pertinent family history.  Social History Social History  Substance Use Topics  . Smoking status: Current Every Day Smoker  . Smokeless tobacco: Never Used  . Alcohol use No     Allergies   Patient has no known allergies.   Review of Systems Review of Systems  Gastrointestinal: Positive for diarrhea, nausea and vomiting. Negative for abdominal pain.  Musculoskeletal: Positive for myalgias.  Skin: Positive for color change.  Neurological: Positive for numbness. Negative for weakness.  All other systems reviewed and are negative.    Physical Exam Updated Vital Signs BP 157/74   Pulse 101   Temp 98.1 F (36.7 C) (Oral)   Resp 22   Ht 5\' 1"  (1.549 m)   Wt 114 lb (51.7 kg)   SpO2 97%   BMI 21.54 kg/m   Physical Exam  Constitutional: She is oriented to person, place, and time. She appears well-developed and well-nourished. She appears distressed.  HENT:  Head: Normocephalic and atraumatic.  Right Ear: External ear normal.  Left Ear: External ear normal.  Nose:  Nose normal.  Eyes: Right eye exhibits no discharge. Left eye exhibits no discharge.  Cardiovascular: Normal rate, regular rhythm and normal heart sounds.   Pulses:      Dorsalis pedis pulses are 0 on the right side, and 0 on the left side.       Posterior tibial pulses are 0 on the right side, and 0 on the left side.  Unable to palpated DP/PT pulses bilaterally However I get a very faint doppler signal in the R DP Unable to feel femoral or popliteal pulses bilaterally but these are dopplerable bilaterally.  Pulmonary/Chest: Effort normal.  Abdominal: She exhibits no  distension.  Neurological: She is alert and oriented to person, place, and time.  Skin: Skin is warm and dry. There is pallor.  Left lower 1/2 leg and foot are cool, mottled Left buttocks without hematoma/swelling  Nursing note and vitals reviewed.    ED Treatments / Results  Labs (all labs ordered are listed, but only abnormal results are displayed) Labs Reviewed  BASIC METABOLIC PANEL - Abnormal; Notable for the following:       Result Value   CO2 13 (*)    Glucose, Bld 107 (*)    BUN 23 (*)    Creatinine, Ser 1.61 (*)    Calcium 8.2 (*)    GFR calc non Af Amer 36 (*)    GFR calc Af Amer 41 (*)    All other components within normal limits  CBC WITH DIFFERENTIAL/PLATELET - Abnormal; Notable for the following:    WBC 17.1 (*)    Neutro Abs 14.6 (*)    All other components within normal limits  CK - Abnormal; Notable for the following:    Total CK 1,992 (*)    All other components within normal limits  APTT - Abnormal; Notable for the following:    aPTT 50 (*)    All other components within normal limits  CBG MONITORING, ED - Abnormal; Notable for the following:    Glucose-Capillary 106 (*)    All other components within normal limits  I-STAT CG4 LACTIC ACID, ED - Abnormal; Notable for the following:    Lactic Acid, Venous 2.56 (*)    All other components within normal limits  PROTIME-INR    EKG  EKG Interpretation  Date/Time:  Saturday January 08 2017 04:23:45 EST Ventricular Rate:  94 PR Interval:    QRS Duration: 79 QT Interval:  377 QTC Calculation: 472 R Axis:   70 Text Interpretation:  Normal sinus rhythm no acute ST/T changes No old tracing to compare Confirmed by Musa Rewerts MD, Brees Hounshell [phone]) on 01/08/2017 6:31:10 AM       Radiology No results found.  Procedures Procedures (including critical care time) CRITICAL CARE Performed by: Pricilla Loveless T   Total critical care time: 30 minutes  Critical care time was exclusive of separately billable  procedures and treating other patients.  Critical care was necessary to treat or prevent imminent or life-threatening deterioration.  Critical care was time spent personally by me on the following activities: development of treatment plan with patient and/or surrogate as well as nursing, discussions with consultants, evaluation of patient's response to treatment, examination of patient, obtaining history from patient or surrogate, ordering and performing treatments and interventions, ordering and review of laboratory studies, ordering and review of radiographic studies, pulse oximetry and re-evaluation of patient's condition.   Medications Ordered in ED Medications  0.9 %  sodium chloride infusion ( Intravenous New Bag/Given 01/08/17 0527)  HYDROmorphone (DILAUDID) injection 1 mg (not administered)  HYDROmorphone (DILAUDID) injection 1 mg (1 mg Intravenous Given 01/08/17 0428)  sodium chloride 0.9 % bolus 500 mL (0 mLs Intravenous Stopped 01/08/17 0502)  LORazepam (ATIVAN) injection 1 mg (1 mg Intravenous Given 01/08/17 0531)     Initial Impression / Assessment and Plan / ED Course  I have reviewed the triage vital signs and the nursing notes.  Pertinent labs & imaging results that were available during my care of the patient were reviewed by me and considered in my medical decision making (see chart for details).  Clinical Course as of Jan 08 633  Sat Jan 08, 2017  1610 Will consult vascular surgery  [SG]  0414 Dr. Randie Heinz will come in to see patient. Asks for repeat labs as her labs at [facility] were atypical including lactate of almost 6 and elevated CK.   [SG]    Clinical Course User Index [SG] Pricilla Loveless, MD    Patient presents with concern for an ischemic left leg. She likely has undiagnosed chronic vascular disease as it is very difficult to feel pulses diffusely in her legs. However she has pulses that can be obtained on Doppler except for her left lower leg. It is also mottled.  She has consented station and can move it but has severe pain. Given IV Dilaudid. Labs from outside hospital consistent with the labs here including her creatinine of 1.6. Later tells me and the vascular surgeon that she has chronic kidney disease, stage III. At this point, continue heparin and the vascular surgeon will take to the OR. At one point was very anxious due to severity of illness and possible consequences and was given ativan  Final Clinical Impressions(s) / ED Diagnoses   Final diagnoses:  Ischemic leg    New Prescriptions New Prescriptions   No medications on file     Pricilla Loveless, MD 01/08/17 9604    Pricilla Loveless, MD 01/08/17 [phone]

## 2017-01-08 NOTE — OR Nursing (Signed)
7 French Sheath removed from Right Groin by Dr. Randie Heinzain.  Pressure held for 15 minutes.  Site soft.

## 2017-01-08 NOTE — Op Note (Signed)
    Patient name: Alison Lynch MRN: [id number] DOB: 01/22/1961 Sex: female  01/08/2017 Pre-operative Diagnosis: acute left lower extremity ischemia following intervention Post-operative diagnosis:  Same Surgeon:  Luanna SalkBrandon C. Randie Heinzain, MD Assistant: Karsten RoKim Trinh, PA Procedure Performed: 1.  Left lower extremity thromboembolectomy 2.  Exposure of dorsalis pedis artery 3.  Left lower extremity angiogram   Indications:  [age] year old female first operation for acute left lower extremity ischemia and in the PACU was noted to have loss of signals. She was therefore indicated for take back to the OR and intervention.  Findings: On angiogram contrast cut off at the level of the stent there was no reconstitution distally. From below knee incision we had adequate inflow and returns significant clot from the anterior tibial artery. The level of the DP we also established good inflow but cannot establish any significant outflow. At completion she had a palpable anterior tibial artery at the level of the ankle with little hope of limb salvage.   Procedure:  The patient was identified in the holding area and taken the operating room where she was placed supine on the operating table general anesthesia was induced she was sterilely prepped and draped in the left lower extremity in usual fashion timeout called. We began by reopening our left femoral incision. We exposed the common femoral artery SFA and profunda placed vessel loops around them. Vision of this time was heparinized. We then cannulated the SFA to one of our previous cannulation sites with 18-gauge needle placed a wire into the SFA followed by 5 French sheath and performed left lower extremity angiogram abruptly cut off of the popliteal artery. With this we then moved to our below-knee exposure and again reset our exposure placed vessel loops around our popliteal anterior tibial and tibioperoneal trunk. We did have inflow to this level. We then made an incision on  our previous patch and had brisk inflow with minimal backbleeding. We performed embolectomy of the anterior tibial artery with 3 fogarty and returns significant clot and then had backflow. There was no backflow from the TP trunk. We then closed our patch ostomy site with 6-0 Prolene suture. We turned attention to the dorsalis pedis artery which was identified with ultrasound guidance. Made a longitudinal incision on the foot dissected down to identify the dorsalis pedis placed vessel loops around this. Transverse incision was made a 2 Fogarty was then passed proximally and returns significant inflow. We then clamped the DP vessel. We attempted embolectomy of the arch. The DP but could not get much to pass. Ultimately we had just trickle backflow. We will close the arteriotomy with 7-0 Prolene suture. This time we obtained hemostasis in all of our wounds. The foot was closed with interrupted 3-0 nylon sutures. The groin was again closed with Vicryl followed by Monocryl at the level of the skin and Dermabond. Wound VAC was reapplied to the below-knee medial incision and wrapped in Ioban. Patient tolerated the procedure well without immediate complication.  There is little hope that revascularization will last for this lower extremity and she will likely need amputation.  Contrast 10 mL.   Blood loss: 150 mL.   Milissa Fesperman C. Randie Heinzain, MD [facility] Office: [phone] Pager: [phone]

## 2017-01-08 NOTE — Anesthesia Preprocedure Evaluation (Addendum)
Anesthesia Evaluation  Patient identified by MRN, date of birth, ID band Patient awake    Reviewed: Allergy & Precautions, NPO status , Patient's Chart, lab work & pertinent test results  Airway Mallampati: III  TM Distance: <3 FB Neck ROM: Full    Dental  (+) Missing, Poor Dentition, Chipped, Dental Advisory Given   Pulmonary Current Smoker,    Pulmonary exam normal breath sounds clear to auscultation       Cardiovascular + Peripheral Vascular Disease  Normal cardiovascular exam Rhythm:Regular Rate:Normal     Neuro/Psych negative neurological ROS  negative psych ROS   GI/Hepatic negative GI ROS, Neg liver ROS,   Endo/Other  negative endocrine ROS  Renal/GU Renal InsufficiencyRenal disease  negative genitourinary   Musculoskeletal negative musculoskeletal ROS (+)   Abdominal   Peds negative pediatric ROS (+)  Hematology negative hematology ROS (+)   Anesthesia Other Findings   Reproductive/Obstetrics negative OB ROS                            Anesthesia Physical Anesthesia Plan  ASA: III  Anesthesia Plan: General   Post-op Pain Management:    Induction: Intravenous  Airway Management Planned: Video Laryngoscope Planned and Oral ETT  Additional Equipment: Arterial line  Intra-op Plan:   Post-operative Plan: Possible Post-op intubation/ventilation  Informed Consent: I have reviewed the patients History and Physical, chart, labs and discussed the procedure including the risks, benefits and alternatives for the proposed anesthesia with the patient or authorized representative who has indicated his/her understanding and acceptance.   Dental advisory given  Plan Discussed with: CRNA and Surgeon  Anesthesia Plan Comments:        Anesthesia Quick Evaluation

## 2017-01-09 ENCOUNTER — Encounter (HOSPITAL_COMMUNITY): Payer: Self-pay | Admitting: Certified Registered Nurse Anesthetist

## 2017-01-09 ENCOUNTER — Inpatient Hospital Stay (HOSPITAL_COMMUNITY): Payer: Medicare HMO | Admitting: Certified Registered Nurse Anesthetist

## 2017-01-09 ENCOUNTER — Encounter (HOSPITAL_COMMUNITY)
Admission: EM | Disposition: A | Discharge status: Rehabilitation Facility | Payer: Self-pay | Source: Home / Self Care | Attending: Vascular Surgery

## 2017-01-09 DIAGNOSIS — N179 Acute kidney failure, unspecified: Secondary | ICD-10-CM | POA: Diagnosis not present

## 2017-01-09 DIAGNOSIS — D696 Thrombocytopenia, unspecified: Secondary | ICD-10-CM | POA: Diagnosis not present

## 2017-01-09 DIAGNOSIS — E1122 Type 2 diabetes mellitus with diabetic chronic kidney disease: Secondary | ICD-10-CM | POA: Diagnosis not present

## 2017-01-09 DIAGNOSIS — N183 Chronic kidney disease, stage 3 (moderate): Secondary | ICD-10-CM | POA: Diagnosis not present

## 2017-01-09 DIAGNOSIS — M6282 Rhabdomyolysis: Secondary | ICD-10-CM | POA: Diagnosis not present

## 2017-01-09 DIAGNOSIS — I743 Embolism and thrombosis of arteries of the lower extremities: Secondary | ICD-10-CM | POA: Diagnosis not present

## 2017-01-09 DIAGNOSIS — I745 Embolism and thrombosis of iliac artery: Secondary | ICD-10-CM | POA: Diagnosis not present

## 2017-01-09 DIAGNOSIS — D62 Acute posthemorrhagic anemia: Secondary | ICD-10-CM | POA: Diagnosis not present

## 2017-01-09 DIAGNOSIS — E1152 Type 2 diabetes mellitus with diabetic peripheral angiopathy with gangrene: Secondary | ICD-10-CM | POA: Diagnosis not present

## 2017-01-09 HISTORY — PX: AMPUTATION: SHX166

## 2017-01-09 LAB — POCT I-STAT 7, (LYTES, BLD GAS, ICA,H+H)
ACID-BASE DEFICIT: 9 mmol/L — AB (ref 0.0–2.0)
BICARBONATE: 16.6 mmol/L — AB (ref 20.0–28.0)
CALCIUM ION: 0.98 mmol/L — AB (ref 1.15–1.40)
HEMATOCRIT: 28 % — AB (ref 36.0–46.0)
HEMOGLOBIN: 9.5 g/dL — AB (ref 12.0–15.0)
O2 Saturation: 100 %
PH ART: 7.292 — AB (ref 7.350–7.450)
POTASSIUM: 4.2 mmol/L (ref 3.5–5.1)
SODIUM: 147 mmol/L — AB (ref 135–145)
TCO2: 18 mmol/L (ref 0–100)
pCO2 arterial: 34.3 mmHg (ref 32.0–48.0)
pO2, Arterial: 306 mmHg — ABNORMAL HIGH (ref 83.0–108.0)

## 2017-01-09 LAB — CBC
HCT: 24.9 % — ABNORMAL LOW (ref 36.0–46.0)
HCT: 19.6 % — ABNORMAL LOW (ref 36.0–46.0)
HCT: 29.9 % — ABNORMAL LOW (ref 36.0–46.0)
Hemoglobin: 8.5 g/dL — ABNORMAL LOW (ref 12.0–15.0)
Hemoglobin: 6.8 g/dL — CL (ref 12.0–15.0)
Hemoglobin: 10.2 g/dL — ABNORMAL LOW (ref 12.0–15.0)
MCH: 29.3 pg (ref 26.0–34.0)
MCH: 29.7 pg (ref 26.0–34.0)
MCH: 29.8 pg (ref 26.0–34.0)
MCHC: 34.7 g/dL (ref 30.0–36.0)
MCHC: 34.1 g/dL (ref 30.0–36.0)
MCHC: 34.1 g/dL (ref 30.0–36.0)
MCV: 85.6 fL (ref 78.0–100.0)
MCV: 87.4 fL (ref 78.0–100.0)
MCV: 85.9 fL (ref 78.0–100.0)
PLATELETS: 93 10*3/uL — AB (ref 150–400)
Platelets: 85 10*3/uL — ABNORMAL LOW (ref 150–400)
Platelets: 62 10*3/uL — ABNORMAL LOW (ref 150–400)
RBC: 2.9 MIL/uL — ABNORMAL LOW (ref 3.87–5.11)
RBC: 2.29 MIL/uL — ABNORMAL LOW (ref 3.87–5.11)
RBC: 3.42 MIL/uL — AB (ref 3.87–5.11)
RDW: 15.3 % (ref 11.5–15.5)
RDW: 14.4 % (ref 11.5–15.5)
RDW: 15 % (ref 11.5–15.5)
WBC: 9.5 10*3/uL (ref 4.0–10.5)
WBC: 10.2 10*3/uL (ref 4.0–10.5)
WBC: 11.6 10*3/uL — AB (ref 4.0–10.5)

## 2017-01-09 LAB — LACTIC ACID, PLASMA
LACTIC ACID, VENOUS: 1.9 mmol/L (ref 0.5–1.9)
Lactic Acid, Venous: 2.2 mmol/L (ref 0.5–1.9)

## 2017-01-09 LAB — GLUCOSE, CAPILLARY: Glucose-Capillary: 125 mg/dL — ABNORMAL HIGH (ref 65–99)

## 2017-01-09 LAB — BASIC METABOLIC PANEL
Anion gap: 9 (ref 5–15)
Anion gap: 8 (ref 5–15)
Anion gap: 7 (ref 5–15)
BUN: 21 mg/dL — ABNORMAL HIGH (ref 6–20)
BUN: 24 mg/dL — ABNORMAL HIGH (ref 6–20)
BUN: 27 mg/dL — ABNORMAL HIGH (ref 6–20)
CALCIUM: 6.3 mg/dL — AB (ref 8.9–10.3)
CALCIUM: 7.2 mg/dL — AB (ref 8.9–10.3)
CHLORIDE: 119 mmol/L — AB (ref 101–111)
CO2: 18 mmol/L — ABNORMAL LOW (ref 22–32)
CO2: 17 mmol/L — ABNORMAL LOW (ref 22–32)
CO2: 17 mmol/L — ABNORMAL LOW (ref 22–32)
CREATININE: 1.89 mg/dL — AB (ref 0.44–1.00)
CREATININE: 1.7 mg/dL — AB (ref 0.44–1.00)
Calcium: 6.7 mg/dL — ABNORMAL LOW (ref 8.9–10.3)
Chloride: 115 mmol/L — ABNORMAL HIGH (ref 101–111)
Chloride: 116 mmol/L — ABNORMAL HIGH (ref 101–111)
Creatinine, Ser: 2.14 mg/dL — ABNORMAL HIGH (ref 0.44–1.00)
GFR calc Af Amer: 39 mL/min — ABNORMAL LOW (ref >60)
GFR calc Af Amer: 34 mL/min — ABNORMAL LOW (ref >60)
GFR calc Af Amer: 29 mL/min — ABNORMAL LOW (ref >60)
GFR calc non Af Amer: 25 mL/min — ABNORMAL LOW (ref >60)
GFR, EST NON AFRICAN AMERICAN: 29 mL/min — AB (ref >60)
GFR, EST NON AFRICAN AMERICAN: 33 mL/min — AB (ref >60)
GLUCOSE: 125 mg/dL — AB (ref 65–99)
GLUCOSE: 126 mg/dL — AB (ref 65–99)
Glucose, Bld: 131 mg/dL — ABNORMAL HIGH (ref 65–99)
Potassium: 4 mmol/L (ref 3.5–5.1)
Potassium: 4.2 mmol/L (ref 3.5–5.1)
Potassium: 4 mmol/L (ref 3.5–5.1)
SODIUM: 141 mmol/L (ref 135–145)
Sodium: 142 mmol/L (ref 135–145)
Sodium: 143 mmol/L (ref 135–145)

## 2017-01-09 LAB — PREPARE RBC (CROSSMATCH)

## 2017-01-09 LAB — CK: CK TOTAL: 27243 U/L — AB (ref 38–234)

## 2017-01-09 SURGERY — AMPUTATION, ABOVE KNEE
Anesthesia: General | Laterality: Left

## 2017-01-09 MED ORDER — ONDANSETRON HCL 4 MG/2ML IJ SOLN: 4.0000 mg | Freq: Once | INTRAMUSCULAR | Status: DC | PRN

## 2017-01-09 MED ORDER — 0.9 % SODIUM CHLORIDE (POUR BTL) OPTIME
TOPICAL | Status: DC | PRN
  Administered 2017-01-09: 1000 mL

## 2017-01-09 MED ORDER — PHENYLEPHRINE 40 MCG/ML (10ML) SYRINGE FOR IV PUSH (FOR BLOOD PRESSURE SUPPORT)
PREFILLED_SYRINGE | INTRAVENOUS | Status: AC
  Filled 2017-01-09: qty 10

## 2017-01-09 MED ORDER — MIDAZOLAM HCL 2 MG/2ML IJ SOLN
INTRAMUSCULAR | Status: AC
  Filled 2017-01-09: qty 2

## 2017-01-09 MED ORDER — SUCCINYLCHOLINE CHLORIDE 200 MG/10ML IV SOSY
PREFILLED_SYRINGE | INTRAVENOUS | Status: DC | PRN
  Administered 2017-01-09: 120 mg via INTRAVENOUS

## 2017-01-09 MED ORDER — METHOCARBAMOL 500 MG PO TABS: 750.0000 mg | ORAL_TABLET | Freq: Three times a day (TID) | ORAL | Status: DC | PRN

## 2017-01-09 MED ORDER — SUCCINYLCHOLINE CHLORIDE 200 MG/10ML IV SOSY
PREFILLED_SYRINGE | INTRAVENOUS | Status: AC
  Filled 2017-01-09: qty 10

## 2017-01-09 MED ORDER — MIDAZOLAM HCL 5 MG/5ML IJ SOLN
INTRAMUSCULAR | Status: DC | PRN
  Administered 2017-01-09: 2 mg via INTRAVENOUS

## 2017-01-09 MED ORDER — BUSPIRONE HCL 10 MG PO TABS
10.0000 mg | ORAL_TABLET | Freq: Every day | ORAL | Status: DC
  Administered 2017-01-09 – 2017-01-13 (×4): 10 mg via ORAL
  Filled 2017-01-09 (×5): qty 1

## 2017-01-09 MED ORDER — OXYCODONE HCL 5 MG/5ML PO SOLN: 5.0000 mg | Freq: Once | ORAL | Status: DC | PRN

## 2017-01-09 MED ORDER — DEXAMETHASONE SODIUM PHOSPHATE 10 MG/ML IJ SOLN
INTRAMUSCULAR | Status: AC
  Filled 2017-01-09: qty 1

## 2017-01-09 MED ORDER — FENTANYL CITRATE (PF) 100 MCG/2ML IJ SOLN
INTRAMUSCULAR | Status: DC | PRN
Start: 2017-01-09 — End: 2017-01-09
  Administered 2017-01-09 (×2): 50 ug via INTRAVENOUS
  Administered 2017-01-09: 100 ug via INTRAVENOUS

## 2017-01-09 MED ORDER — SUGAMMADEX SODIUM 200 MG/2ML IV SOLN
INTRAVENOUS | Status: DC | PRN
  Administered 2017-01-09: 200 mg via INTRAVENOUS

## 2017-01-09 MED ORDER — BUSPIRONE HCL 15 MG PO TABS
15.0000 mg | ORAL_TABLET | Freq: Every morning | ORAL | Status: DC
  Administered 2017-01-10 – 2017-01-13 (×4): 15 mg via ORAL
  Filled 2017-01-09 (×4): qty 1

## 2017-01-09 MED ORDER — CLOPIDOGREL BISULFATE 75 MG PO TABS
75.0000 mg | ORAL_TABLET | Freq: Every day | ORAL | Status: DC
  Administered 2017-01-10 – 2017-01-13 (×4): 75 mg via ORAL
  Filled 2017-01-09 (×5): qty 1

## 2017-01-09 MED ORDER — PHENYLEPHRINE HCL 10 MG/ML IJ SOLN
INTRAMUSCULAR | Status: DC | PRN
  Administered 2017-01-09 (×2): 80 ug via INTRAVENOUS
  Administered 2017-01-09: 120 ug via INTRAVENOUS

## 2017-01-09 MED ORDER — PHENYLEPHRINE HCL 10 MG/ML IJ SOLN
INTRAVENOUS | Status: DC | PRN
  Administered 2017-01-09: 25 ug/min via INTRAVENOUS

## 2017-01-09 MED ORDER — ATORVASTATIN CALCIUM 80 MG PO TABS
80.0000 mg | ORAL_TABLET | Freq: Every day | ORAL | Status: DC
  Administered 2017-01-10 – 2017-01-13 (×4): 80 mg via ORAL
  Filled 2017-01-09 (×4): qty 1

## 2017-01-09 MED ORDER — SUGAMMADEX SODIUM 200 MG/2ML IV SOLN
INTRAVENOUS | Status: AC
  Filled 2017-01-09: qty 2

## 2017-01-09 MED ORDER — CLOPIDOGREL BISULFATE 75 MG PO TABS: 300.0000 mg | ORAL_TABLET | Freq: Once | ORAL | Status: DC

## 2017-01-09 MED ORDER — FENTANYL CITRATE (PF) 100 MCG/2ML IJ SOLN
25.0000 ug | INTRAMUSCULAR | Status: DC | PRN
  Administered 2017-01-09: 25 ug via INTRAVENOUS

## 2017-01-09 MED ORDER — FENTANYL CITRATE (PF) 100 MCG/2ML IJ SOLN
INTRAMUSCULAR | Status: AC
Start: 2017-01-09 — End: 2017-01-09
  Filled 2017-01-09: qty 4

## 2017-01-09 MED ORDER — GABAPENTIN 300 MG PO CAPS
300.0000 mg | ORAL_CAPSULE | Freq: Three times a day (TID) | ORAL | Status: DC
  Administered 2017-01-09 – 2017-01-13 (×13): 300 mg via ORAL
  Filled 2017-01-09 (×4): qty 1
  Filled 2017-01-09: qty 3
  Filled 2017-01-09 (×9): qty 1

## 2017-01-09 MED ORDER — ALBUMIN HUMAN 5 % IV SOLN
INTRAVENOUS | Status: DC | PRN
  Administered 2017-01-09: 13:00:00 via INTRAVENOUS

## 2017-01-09 MED ORDER — PROPOFOL 10 MG/ML IV BOLUS
INTRAVENOUS | Status: DC | PRN
Start: 2017-01-09 — End: 2017-01-09
  Administered 2017-01-09: 130 mg via INTRAVENOUS

## 2017-01-09 MED ORDER — SODIUM CHLORIDE 0.9 % IV SOLN: Freq: Once | INTRAVENOUS | Status: DC

## 2017-01-09 MED ORDER — BUSPIRONE HCL 10 MG PO TABS: 10.0000 mg | ORAL_TABLET | ORAL | Status: DC

## 2017-01-09 MED ORDER — LORAZEPAM 2 MG/ML IJ SOLN
1.0000 mg | Freq: Four times a day (QID) | INTRAMUSCULAR | Status: DC | PRN
  Administered 2017-01-09 (×2): 1 mg via INTRAVENOUS
  Filled 2017-01-09 (×3): qty 1

## 2017-01-09 MED ORDER — DEXAMETHASONE SODIUM PHOSPHATE 10 MG/ML IJ SOLN
INTRAMUSCULAR | Status: DC | PRN
  Administered 2017-01-09: 10 mg via INTRAVENOUS

## 2017-01-09 MED ORDER — PROPOFOL 10 MG/ML IV BOLUS
INTRAVENOUS | Status: AC
  Filled 2017-01-09: qty 20

## 2017-01-09 MED ORDER — SODIUM BICARBONATE 8.4 % IV SOLN
INTRAVENOUS | Status: DC
  Administered 2017-01-09: 16:00:00 via INTRAVENOUS
  Filled 2017-01-09 (×4): qty 75

## 2017-01-09 MED ORDER — ROCURONIUM BROMIDE 100 MG/10ML IV SOLN
INTRAVENOUS | Status: DC | PRN
  Administered 2017-01-09: 30 mg via INTRAVENOUS

## 2017-01-09 MED ORDER — LIDOCAINE HCL (CARDIAC) 20 MG/ML IV SOLN
INTRAVENOUS | Status: DC | PRN
  Administered 2017-01-09: 40 mg via INTRAVENOUS

## 2017-01-09 MED ORDER — SERTRALINE HCL 50 MG PO TABS
50.0000 mg | ORAL_TABLET | Freq: Every day | ORAL | Status: DC
  Administered 2017-01-10 – 2017-01-13 (×4): 50 mg via ORAL
  Filled 2017-01-09 (×4): qty 1

## 2017-01-09 MED ORDER — MAGNESIUM SULFATE 2 GM/50ML IV SOLN
2.0000 g | Freq: Once | INTRAVENOUS | Status: AC
  Administered 2017-01-09: 2 g via INTRAVENOUS
  Filled 2017-01-09: qty 50

## 2017-01-09 MED ORDER — BOOST / RESOURCE BREEZE PO LIQD
1.0000 | Freq: Three times a day (TID) | ORAL | Status: DC
  Administered 2017-01-09 – 2017-01-12 (×6): 1 via ORAL

## 2017-01-09 MED ORDER — LIDOCAINE 2% (20 MG/ML) 5 ML SYRINGE
INTRAMUSCULAR | Status: AC
  Filled 2017-01-09: qty 5

## 2017-01-09 MED ORDER — FENTANYL CITRATE (PF) 100 MCG/2ML IJ SOLN: INTRAMUSCULAR | Status: DC | PRN

## 2017-01-09 MED ORDER — ROCURONIUM BROMIDE 50 MG/5ML IV SOSY
PREFILLED_SYRINGE | INTRAVENOUS | Status: AC
  Filled 2017-01-09: qty 5

## 2017-01-09 MED ORDER — CLINDAMYCIN HCL 300 MG PO CAPS
300.0000 mg | ORAL_CAPSULE | Freq: Three times a day (TID) | ORAL | Status: DC
  Administered 2017-01-09 – 2017-01-13 (×13): 300 mg via ORAL
  Filled 2017-01-09 (×17): qty 1

## 2017-01-09 MED ORDER — FENTANYL CITRATE (PF) 100 MCG/2ML IJ SOLN
INTRAMUSCULAR | Status: AC
  Administered 2017-01-09: 25 ug via INTRAVENOUS
  Filled 2017-01-09: qty 2

## 2017-01-09 MED ORDER — LEVOTHYROXINE SODIUM 50 MCG PO TABS
50.0000 ug | ORAL_TABLET | Freq: Every day | ORAL | Status: DC
  Administered 2017-01-10 – 2017-01-13 (×4): 50 ug via ORAL
  Filled 2017-01-09 (×4): qty 1

## 2017-01-09 MED ORDER — SODIUM CHLORIDE 0.9 % IV BOLUS (SEPSIS)
1000.0000 mL | Freq: Once | INTRAVENOUS | Status: AC
  Administered 2017-01-09: 1000 mL via INTRAVENOUS

## 2017-01-09 MED ORDER — ALPRAZOLAM 0.25 MG PO TABS
0.2500 mg | ORAL_TABLET | Freq: Three times a day (TID) | ORAL | Status: DC | PRN
  Administered 2017-01-10 – 2017-01-11 (×4): 0.25 mg via ORAL
  Filled 2017-01-09 (×4): qty 1

## 2017-01-09 MED ORDER — OXYCODONE HCL 5 MG PO TABS: 5.0000 mg | ORAL_TABLET | Freq: Once | ORAL | Status: DC | PRN

## 2017-01-09 MED ORDER — ONDANSETRON HCL 4 MG/2ML IJ SOLN
INTRAMUSCULAR | Status: DC | PRN
  Administered 2017-01-09: 4 mg via INTRAVENOUS

## 2017-01-09 MED ORDER — FENTANYL CITRATE (PF) 100 MCG/2ML IJ SOLN: 25.0000 ug | INTRAMUSCULAR | Status: DC | PRN

## 2017-01-09 MED ORDER — SODIUM CHLORIDE 0.9 % IV SOLN
1.0000 g | Freq: Once | INTRAVENOUS | Status: AC
  Administered 2017-01-09: 1 g via INTRAVENOUS
  Filled 2017-01-09: qty 10

## 2017-01-09 MED ORDER — ORAL CARE MOUTH RINSE
15.0000 mL | Freq: Two times a day (BID) | OROMUCOSAL | Status: DC
  Administered 2017-01-09 – 2017-01-11 (×4): 15 mL via OROMUCOSAL

## 2017-01-09 MED ORDER — HEPARIN SODIUM (PORCINE) 5000 UNIT/ML IJ SOLN: 5000.0000 [IU] | Freq: Three times a day (TID) | INTRAMUSCULAR | Status: DC

## 2017-01-09 MED ORDER — ONDANSETRON HCL 4 MG/2ML IJ SOLN
INTRAMUSCULAR | Status: AC
  Filled 2017-01-09: qty 2

## 2017-01-09 SURGICAL SUPPLY — 47 items
BANDAGE ACE 4X5 VEL STRL LF (GAUZE/BANDAGES/DRESSINGS) ×2 IMPLANT
BANDAGE ACE 6X5 VEL STRL LF (GAUZE/BANDAGES/DRESSINGS) ×2 IMPLANT
BLADE SAW RECIP 87.9 MT (BLADE) ×2 IMPLANT
BNDG COHESIVE 6X5 TAN STRL LF (GAUZE/BANDAGES/DRESSINGS) ×2 IMPLANT
BNDG GAUZE ELAST 4 BULKY (GAUZE/BANDAGES/DRESSINGS) ×2 IMPLANT
CANISTER SUCTION 2500CC (MISCELLANEOUS) ×2 IMPLANT
CLIP TI MEDIUM 6 (CLIP) ×2 IMPLANT
COVER SURGICAL LIGHT HANDLE (MISCELLANEOUS) ×2 IMPLANT
DRAIN CHANNEL 19F RND (DRAIN) IMPLANT
DRAPE ORTHO SPLIT 77X108 STRL (DRAPES) ×2
DRAPE PROXIMA HALF (DRAPES) ×2 IMPLANT
DRAPE SURG ORHT 6 SPLT 77X108 (DRAPES) ×2 IMPLANT
DRSG ADAPTIC 3X8 NADH LF (GAUZE/BANDAGES/DRESSINGS) ×2 IMPLANT
ELECT REM PT RETURN 9FT ADLT (ELECTROSURGICAL) ×2
ELECTRODE REM PT RTRN 9FT ADLT (ELECTROSURGICAL) ×1 IMPLANT
EVACUATOR SILICONE 100CC (DRAIN) IMPLANT
GAUZE SPONGE 4X4 12PLY STRL (GAUZE/BANDAGES/DRESSINGS) ×2 IMPLANT
GLOVE BIO SURGEON STRL SZ 6.5 (GLOVE) ×2 IMPLANT
GLOVE BIO SURGEON STRL SZ7.5 (GLOVE) ×2 IMPLANT
GLOVE BIOGEL PI IND STRL 6.5 (GLOVE) ×1 IMPLANT
GLOVE BIOGEL PI IND STRL 7.0 (GLOVE) ×3 IMPLANT
GLOVE BIOGEL PI INDICATOR 6.5 (GLOVE) ×1
GLOVE BIOGEL PI INDICATOR 7.0 (GLOVE) ×3
GOWN STRL REUS W/ TWL LRG LVL3 (GOWN DISPOSABLE) ×2 IMPLANT
GOWN STRL REUS W/ TWL XL LVL3 (GOWN DISPOSABLE) ×1 IMPLANT
GOWN STRL REUS W/TWL LRG LVL3 (GOWN DISPOSABLE) ×2
GOWN STRL REUS W/TWL XL LVL3 (GOWN DISPOSABLE) ×1
KIT BASIN OR (CUSTOM PROCEDURE TRAY) ×2 IMPLANT
KIT ROOM TURNOVER OR (KITS) ×2 IMPLANT
NS IRRIG 1000ML POUR BTL (IV SOLUTION) ×2 IMPLANT
PACK GENERAL/GYN (CUSTOM PROCEDURE TRAY) ×2 IMPLANT
PAD ARMBOARD 7.5X6 YLW CONV (MISCELLANEOUS) ×4 IMPLANT
SAW GIGLI STERILE 20 (MISCELLANEOUS) ×2 IMPLANT
SPONGE GAUZE 4X4 12PLY STER LF (GAUZE/BANDAGES/DRESSINGS) ×2 IMPLANT
STAPLER VISISTAT 35W (STAPLE) ×2 IMPLANT
STOCKINETTE IMPERVIOUS LG (DRAPES) ×2 IMPLANT
SUT ETHILON 3 0 PS 1 (SUTURE) IMPLANT
SUT SILK 0 TIES 10X30 (SUTURE) ×2 IMPLANT
SUT SILK 2 0 (SUTURE) ×1
SUT SILK 2-0 18XBRD TIE 12 (SUTURE) ×1 IMPLANT
SUT SILK 3 0 (SUTURE)
SUT SILK 3-0 18XBRD TIE 12 (SUTURE) IMPLANT
SUT VIC AB 2-0 CT1 18 (SUTURE) ×6 IMPLANT
TOWEL OR 17X24 6PK STRL BLUE (TOWEL DISPOSABLE) ×2 IMPLANT
TOWEL OR 17X26 10 PK STRL BLUE (TOWEL DISPOSABLE) ×2 IMPLANT
UNDERPAD 30X30 (UNDERPADS AND DIAPERS) ×2 IMPLANT
WATER STERILE IRR 1000ML POUR (IV SOLUTION) ×2 IMPLANT

## 2017-01-09 NOTE — Progress Notes (Signed)
PT Cancellation Note  Patient Details Name: Alison Lynch MRN: [id number] DOB: 07/02/1961   Cancelled Treatment:    Reason Eval/Treat Not Completed: Patient at procedure or test/unavailable.  Patient in OR for amputation per chart.  **MD:  Please write post-op orders for PT when appropriate for patient.  Thank you.   Vena AustriaSusan H Jiaire Rosebrook 01/09/2017, 12:58 PM Durenda HurtSusan H. Renaldo Fiddleravis, PT, [facility] Pager [phone]

## 2017-01-09 NOTE — Anesthesia Procedure Notes (Signed)
Procedure Name: Intubation Date/Time: 01/09/2017 12:37 PM Performed by: Jed LimerickHARDER, Damani Kelemen S Pre-anesthesia Checklist: Patient identified, Emergency Drugs available, Suction available and Patient being monitored Patient Re-evaluated:Patient Re-evaluated prior to inductionOxygen Delivery Method: Circle System Utilized Preoxygenation: Pre-oxygenation with 100% oxygen Intubation Type: IV induction, Rapid sequence and Cricoid Pressure applied Laryngoscope Size: Glidescope and 3 Grade View: Grade I Tube type: Oral Tube size: 7.0 mm Number of attempts: 1 Airway Equipment and Method: Stylet and Oral airway Placement Confirmation: ETT inserted through vocal cords under direct vision,  positive ETCO2 and breath sounds checked- equal and bilateral Secured at: 21 cm Tube secured with: Tape Dental Injury: Teeth and Oropharynx as per pre-operative assessment  Difficulty Due To: Difficulty was anticipated and Difficult Airway- due to limited oral opening

## 2017-01-09 NOTE — Plan of Care (Signed)
Problem: Self-Concept: Goal: Verbalizations of decreased anxiety will increase Outcome: Progressing Patient verbalizes feelings of sorrow towards loss of limb, anxious. Has supportive family and church members. Spoke with [place] regarding these feelings. Spiritual services consulted.

## 2017-01-09 NOTE — Transfer of Care (Signed)
Immediate Anesthesia Transfer of Care Note  Patient: Alison Lynch  Procedure(s) Performed: Procedure(s): AMPUTATION ABOVE KNEE (Left)  Patient Location: PACU  Anesthesia Type:General  Level of Consciousness: awake, alert  and oriented  Airway & Oxygen Therapy: Patient Spontanous Breathing and Patient connected to nasal cannula oxygen  Post-op Assessment: Report given to RN and Post -op Vital signs reviewed and stable  Post vital signs: Reviewed and stable  Last Vitals:  Vitals:   01/09/17 1100 01/09/17 1115  BP: 114/60   Pulse: (!) 124 (!) 119  Resp: (!) 21 19  Temp:  36.7 C    Last Pain:  Vitals:   01/09/17 1115  TempSrc: Oral  PainSc:       Patients Stated Pain Goal: 3 (01/08/17 2115)  Complications: No apparent anesthesia complications

## 2017-01-09 NOTE — Op Note (Signed)
    Patient name: Alison Lynch MRN: [id number] DOB: 08/03/1961 Sex: female  01/09/2017 Pre-operative Diagnosis: ischemic left lower extremity with necrotic muscle Post-operative diagnosis:  Same Surgeon:  Apolinar JunesBrandon C. Randie Heinzain, MD Assistant: Karsten RoKim Trinh, PA Procedure Performed: left above knee amputation  Indications:  56 year old female status post attempted revascularization of left posterior for acute limb ischemia. She now has elevated CKs with nonviable left lower extremity and necrotic muscle.  Findings: Muscle below the knee was nonviable. Above-the-knee was adequate bleeding all muscle was viable.   Procedure:  The patient was identified in the holding area and taken to the operating room where she was placed supine on the operating table general anesthesia was induced she was given antibiotics sterile prep and drape left lower extremity in the usual fashion. We inspected the below-knee muscle but this appeared to be nonviable. With that we elected to perform an above-knee indication left side. Skin was marked and Esmarch was used to exsanguinate blood from the left lower extremity. Tourniquet was then inflated to 250 mmHg. A circumferential incision of the leg was then made with 10 blade. This was carried down to bone with electrocautery soft tissue on the bone was retracted Gigli-saw used to transect the bone. Blood vessels or nerves were divided between clamps. This time the tourniquet was let down inflated for 4 minutes. The sciatic nerve was pulled on tension tied off and cut. The artery and vein were suture ligated with 2-0 Vicryl suture. Hemostasis was obtained and the wound was irrigated. We did attempt close the tissue but the bone was noted to be long and was transected with a second saw. This was then smoothed on the edges. Fascia was then approximated with 2-0 Vicryl sutures and the skin was approximated with staples. Patient was allowed to awaken from anesthesia having tolerated the procedure  well without immediate complication.   [name] C. Randie Heinzain, MD [facility] [place] Office: [phone] Pager: [phone]

## 2017-01-09 NOTE — Anesthesia Preprocedure Evaluation (Addendum)
Anesthesia Evaluation  Patient identified by MRN, date of birth, ID band Patient awake    Reviewed: Allergy & Precautions, NPO status , Patient's Chart, lab work & pertinent test results  Airway Mallampati: II  TM Distance: >3 FB Neck ROM: Full    Dental  (+) Teeth Intact, Dental Advisory Given   Pulmonary Current Smoker,    breath sounds clear to auscultation       Cardiovascular hypertension,  Rhythm:Regular Rate:Normal     Neuro/Psych    GI/Hepatic   Endo/Other    Renal/GU      Musculoskeletal   Abdominal   Peds  Hematology   Anesthesia Other Findings   Reproductive/Obstetrics                            Anesthesia Physical Anesthesia Plan  ASA: IV and emergent  Anesthesia Plan: General   Post-op Pain Management:    Induction: Intravenous  Airway Management Planned: Oral ETT  Additional Equipment:   Intra-op Plan:   Post-operative Plan: Extubation in OR  Informed Consent: I have reviewed the patients History and Physical, chart, labs and discussed the procedure including the risks, benefits and alternatives for the proposed anesthesia with the patient or authorized representative who has indicated his/her understanding and acceptance.   Dental advisory given  Plan Discussed with: CRNA and Anesthesiologist  Anesthesia Plan Comments:         Anesthesia Quick Evaluation

## 2017-01-09 NOTE — Progress Notes (Signed)
CRITICAL VALUE ALERT  Critical value received:  Calcium 6.3  Date of notification:  01/09/17  Time of notification: 1654  Critical value read back:Yes.    Nurse who received alert:  Clayson Riling  MD notified (1st page):  PA- Karsten RoKim Trinh  Time of first page:  1656  Responding MD:  Karsten RoKim Trinh PA  Time MD responded:  [phone]

## 2017-01-09 NOTE — Anesthesia Postprocedure Evaluation (Addendum)
Anesthesia Post Note  Patient: Alison Lynch  Procedure(s) Performed: Procedure(s) (LRB): AMPUTATION ABOVE KNEE (Left)  Patient location during evaluation: PACU Anesthesia Type: General Level of consciousness: awake, awake and alert and oriented Pain management: pain level controlled Respiratory status: spontaneous breathing, nonlabored ventilation and respiratory function stable Cardiovascular status: blood pressure returned to baseline Anesthetic complications: no       Last Vitals:  Vitals:   01/09/17 1435 01/09/17 1448  BP: 110/61 115/69  Pulse: (!) 117 (!) 114  Resp: 17 13  Temp: 36.7 C 36.8 C    Last Pain:  Vitals:   01/09/17 1448  TempSrc: Oral  PainSc: 8                  Daven Montz COKER

## 2017-01-09 NOTE — Consult Note (Signed)
Renal Service Consult Note [facility]  [name] 01/09/2017 [name] D Requesting Physician:  Dr Randie Heinz  Reason for Consult:  AKI in setting of acute limb ischemia w rhabdo HPI: The patient is a 56 y.o. year-old with history of DM2, HTN, CKD3 presented to outside ED with left leg pain below the knee and was found to have acutely ischemic foot.  Was taken to OR and had aortogram with runoff, bilat iliac stents placed, L iliofem and LLE embolectomy and endarterectomy below the L knee, also fasciotomy x 4 compt.  Had to go back to OR last night for loss of signal. This am leg was nonviable. Pt initially refused amputation but then agreed and is now back from OR sp L BKA.  CPK's are high 23,000 range and pt rec'd IV contrast and creat is rising from 1.6 on admission to 1.9 this am.  Asked to see for AKI.    Old creatinines in Care Everywhere show baseline of 1.3- 1.5 in June 2017.    Pt is confused in post-op area after BKA and doesn't give any reliable history.  Denies CP or SOB.   Home meds > lipitor, buspar, Cleocin, neurontin, T4, Robaxin, zoloft   Past Medical History  Past Medical History:  Diagnosis Date  . Cervical myelopathy (HCC)   . CKD (chronic kidney disease) stage 3, GFR 30-59 ml/min   . Depression   . Dysphagia   . Hx of migraine headaches   . Hyperlipidemia   . Hypertension   . Hypothyroid   . Osteoporosis   . Panic attacks   . PTSD (post-traumatic stress disorder)    Past Surgical History History reviewed. No pertinent surgical history. Family History History reviewed. No pertinent family history. Social History  reports that she has been smoking.  She has never used smokeless tobacco. She reports that she does not drink alcohol or use drugs. Allergies No Known Allergies Home medications Prior to Admission medications   Medication Sig Start Date End Date Taking? Authorizing Provider  atorvastatin (LIPITOR) 80 MG tablet Take 80 mg by mouth  daily.   Yes Historical Provider, MD  busPIRone (BUSPAR) 10 MG tablet Take 10-15 mg by mouth See admin instructions. Take 1 and 1/2 tablets every morning and take 1 tablet at bedtime   Yes Historical Provider, MD  clindamycin (CLEOCIN) 300 MG capsule Take 300 mg by mouth 3 (three) times daily.   Yes Historical Provider, MD  gabapentin (NEURONTIN) 300 MG capsule Take 300 mg by mouth 3 (three) times daily. 08/24/16 08/24/17 Yes Historical Provider, MD  levothyroxine (SYNTHROID, LEVOTHROID) 50 MCG tablet Take 50 mcg by mouth daily before breakfast.   Yes Historical Provider, MD  methocarbamol (ROBAXIN) 750 MG tablet Take 750 mg by mouth every 8 (eight) hours as needed for muscle spasms.   Yes Historical Provider, MD  sertraline (ZOLOFT) 50 MG tablet Take 50 mg by mouth daily.   Yes Historical Provider, MD   Liver Function Tests No results for input(s): AST, ALT, ALKPHOS, BILITOT, PROT, ALBUMIN in the last 168 hours. No results for input(s): LIPASE, AMYLASE in the last 168 hours. CBC  Recent Labs Lab 01/08/17 0426  01/08/17 2000 01/09/17 0000 01/09/17 0454 01/09/17 1247  WBC 17.1*  --  13.8* 11.6* 9.5  --   NEUTROABS 14.6*  --   --   --   --   --   HGB 13.0  < > 9.9* 8.5* 6.8* 9.5*  HCT 37.9  < >  28.6* 24.9* 19.6* 28.0*  MCV 88.1  --  86.7 85.9 85.6  --   PLT 238  --  114* 93* 85*  --   < > = values in this interval not displayed. Basic Metabolic Panel  Recent Labs Lab 01/08/17 0426  01/08/17 1418 01/08/17 1528 01/08/17 1822 01/08/17 2030 01/09/17 0000 01/09/17 0454 01/09/17 1247  NA 135  < > 143 144 142 141 142 141 147*  K 3.5  < > 4.4 4.4 4.1 3.9 4.0 4.2 4.2  CL 107  --   --   --   --  115* 115* 116*  --   CO2 13*  --   --   --   --  17* 18* 17*  --   GLUCOSE 107*  --   --   --   --  151* 131* 125*  --   BUN 23*  --   --   --   --  18 21* 24*  --   CREATININE 1.61*  --   --   --   --  1.42* 1.70* 1.89*  --   CALCIUM 8.2*  --   --   --   --  7.7* 7.2* 6.7*  --   < > = values  in this interval not displayed. Iron/TIBC/Ferritin/ %Sat No results found for: IRON, TIBC, FERRITIN, IRONPCTSAT  Vitals:   01/09/17 1349 01/09/17 1350 01/09/17 1355 01/09/17 1405  BP: 110/66   117/67  Pulse:  (!) 121 (!) 118 (!) 122  Resp:  19 16 (!) 31  Temp: 98.1 F (36.7 C)     TempSrc:      SpO2:  98% 98% 100%  Weight:      Height:       Exam Gen confused, postop still sedated  No rash, cyanosis or gangrene Sclera anicteric, throat clear  No jvd or bruits Chest clear bilat, no rales or wheezing RRR no MRG Abd soft ntnd no mass or ascites +bs GU foley draining dark amber urine , not a lot MS no joint effusions or deformity Ext L BKA wrapped, R leg no edema Neuro is drowsy, nonfocal    Assessment: 1. Acute on CRF - due to contrast and acute rhabdomyolysis.  She is now s/p BKA so hopefully CPK burden won't be as bad.  Will plan for bicarb gtt and get labs later tonight.  Has room for volume, but needs close watching for vol overload.   2. CKD 3 - baseline creat 1.4 3. DM2 4. HTN 5. Hypernatremia 6. Anemia - sp prbc x 5 (3 yest and 2 today).  Hb 9 after all prbc's.      Plan - as above.   Vinson Moselleob Collis Thede MD [facility] pager [phone]   01/09/2017, 2:10 PM

## 2017-01-09 NOTE — Progress Notes (Addendum)
[facility] Progress Note  Subjective  - POD #1  Pain left leg. Is very thirsty  Objective Vitals:   01/09/17 0730 01/09/17 0735  BP:    Pulse: (!) 128   Resp: (!) 21   Temp:  98.5 F (36.9 C)    Intake/Output Summary (Last 24 hours) at 01/09/17 0739 Last data filed at 01/09/17 0700  Gross per 24 hour  Intake           7463.5 ml  Output             3815 ml  Net           3648.5 ml   Lungs clear bilaterally Sinus tachy Left groin incision c/d/i. No hematoma Right groin without hematoma Left lower leg with VAC, dressing is leaking at distal aspect. Left foot incision with mild oozing.  Monophasic left AT. Left foot cool without motor sensation. Minimal sensation to dorsal aspect. No sensation plantar aspect.  Right foot warm with audible right DP.   Assessment/Planning: 56 y.o. female is s/p: aortogram, LLE embolectomy, endarterectomy and patch angioplasty below knee popliteal and tpt,  bilateral common iliac stents, left popliteal stenting, 4 compartment fasciotomy with subsequent lost of doppler signals with return to OR for LLE thromboembolectomy, exposure of DP artery and LLE angiogram  1 Day Post-Op   CK elevated last night. Left leg is not viable. Will need amputation but patient is extremely anxious about this and resistant to idea. Has previously stated that she would commit suicide if she had an amputation. Keep NPO in case patient consents to left BKA this am. Patient to talk to family. Will have palliative see as well to help with decision making.  ARF: Secondary to nonviable leg and contrast nephropathy. Has baseline CKD 3. UOP ok, but creatinine rising.  IVF increased. Will have nephrology see this am.  ABLA: transfusing this am. Anxiety: IV ativan prn   Raymond Gurney 01/09/2017 7:39 AM --  Laboratory CBC    Component Value Date/Time   WBC 9.5 01/09/2017 0454   HGB 6.8 (LL) 01/09/2017 0454   HCT 19.6 (L) 01/09/2017 0454   PLT 85  (L) 01/09/2017 0454    BMET    Component Value Date/Time   NA 141 01/09/2017 0454   K 4.2 01/09/2017 0454   CL 116 (H) 01/09/2017 0454   CO2 17 (L) 01/09/2017 0454   GLUCOSE 125 (H) 01/09/2017 0454   BUN 24 (H) 01/09/2017 0454   CREATININE 1.89 (H) 01/09/2017 0454   CALCIUM 6.7 (L) 01/09/2017 0454   GFRNONAA 29 (L) 01/09/2017 0454   GFRAA 34 (L) 01/09/2017 0454    COAG Lab Results  Component Value Date   INR 1.14 01/08/2017   No results found for: PTT  Antibiotics Anti-infectives    Start     Dose/Rate Route Frequency Ordered Stop   01/09/17 0200  cefUROXime (ZINACEF) 1.5 g in dextrose 5 % 50 mL IVPB     1.5 g 100 mL/hr over 30 Minutes Intravenous Every 12 hours 01/08/17 1616 01/10/17 0159       Maris Berger, PA-C [facility] Office: [phone] Pager: [phone] 01/09/2017 7:39 AM   I have independently interviewed patient and agree with PA assessment and plan above. I have discussed with patient at length that her left leg is non-viable at this time and will only lead to further deterioration of her renal function and ongoing blood transfusion. Her husband is with her  this morning and they agree that amputation is best option. Will likely be aka but will attempt left bka if muscle appears viable.   Brenna Friesenhahn C. Randie Heinzain, MD [facility] [place] Office: [phone] Pager: [phone]

## 2017-01-10 ENCOUNTER — Inpatient Hospital Stay (HOSPITAL_COMMUNITY): Payer: Medicare HMO

## 2017-01-10 ENCOUNTER — Encounter (HOSPITAL_COMMUNITY): Payer: Self-pay | Admitting: Vascular Surgery

## 2017-01-10 DIAGNOSIS — S78112A Complete traumatic amputation at level between left hip and knee, initial encounter: Secondary | ICD-10-CM

## 2017-01-10 DIAGNOSIS — I10 Essential (primary) hypertension: Secondary | ICD-10-CM

## 2017-01-10 DIAGNOSIS — Z89612 Acquired absence of left leg above knee: Secondary | ICD-10-CM

## 2017-01-10 DIAGNOSIS — I998 Other disorder of circulatory system: Secondary | ICD-10-CM

## 2017-01-10 DIAGNOSIS — D696 Thrombocytopenia, unspecified: Secondary | ICD-10-CM

## 2017-01-10 DIAGNOSIS — G8918 Other acute postprocedural pain: Secondary | ICD-10-CM

## 2017-01-10 DIAGNOSIS — F4310 Post-traumatic stress disorder, unspecified: Secondary | ICD-10-CM

## 2017-01-10 DIAGNOSIS — I8291 Chronic embolism and thrombosis of unspecified vein: Secondary | ICD-10-CM

## 2017-01-10 DIAGNOSIS — D72829 Elevated white blood cell count, unspecified: Secondary | ICD-10-CM

## 2017-01-10 DIAGNOSIS — G959 Disease of spinal cord, unspecified: Secondary | ICD-10-CM

## 2017-01-10 DIAGNOSIS — G546 Phantom limb syndrome with pain: Secondary | ICD-10-CM

## 2017-01-10 DIAGNOSIS — G894 Chronic pain syndrome: Secondary | ICD-10-CM

## 2017-01-10 DIAGNOSIS — N179 Acute kidney failure, unspecified: Secondary | ICD-10-CM

## 2017-01-10 DIAGNOSIS — N183 Chronic kidney disease, stage 3 (moderate): Secondary | ICD-10-CM

## 2017-01-10 LAB — COMPREHENSIVE METABOLIC PANEL
ALT: 45 U/L (ref 14–54)
AST: 137 U/L — AB (ref 15–41)
Albumin: 2.6 g/dL — ABNORMAL LOW (ref 3.5–5.0)
Alkaline Phosphatase: 39 U/L (ref 38–126)
Anion gap: 7 (ref 5–15)
BILIRUBIN TOTAL: 0.7 mg/dL (ref 0.3–1.2)
BUN: 25 mg/dL — AB (ref 6–20)
CHLORIDE: 113 mmol/L — AB (ref 101–111)
CO2: 20 mmol/L — ABNORMAL LOW (ref 22–32)
Calcium: 6.3 mg/dL — CL (ref 8.9–10.3)
Creatinine, Ser: 2.09 mg/dL — ABNORMAL HIGH (ref 0.44–1.00)
GFR calc Af Amer: 30 mL/min — ABNORMAL LOW (ref >60)
GFR calc non Af Amer: 26 mL/min — ABNORMAL LOW (ref >60)
Glucose, Bld: 110 mg/dL — ABNORMAL HIGH (ref 65–99)
Potassium: 3.7 mmol/L (ref 3.5–5.1)
Sodium: 140 mmol/L (ref 135–145)
Total Protein: 4.1 g/dL — ABNORMAL LOW (ref 6.5–8.1)

## 2017-01-10 LAB — CBC
HCT: 28.9 % — ABNORMAL LOW (ref 36.0–46.0)
Hemoglobin: 10.1 g/dL — ABNORMAL LOW (ref 12.0–15.0)
MCH: 30.2 pg (ref 26.0–34.0)
MCHC: 34.9 g/dL (ref 30.0–36.0)
MCV: 86.5 fL (ref 78.0–100.0)
PLATELETS: 65 10*3/uL — AB (ref 150–400)
RBC: 3.34 MIL/uL — ABNORMAL LOW (ref 3.87–5.11)
RDW: 14.9 % (ref 11.5–15.5)
WBC: 12.7 10*3/uL — ABNORMAL HIGH (ref 4.0–10.5)

## 2017-01-10 LAB — MAGNESIUM: MAGNESIUM: 2.1 mg/dL (ref 1.7–2.4)

## 2017-01-10 MED ORDER — SODIUM CHLORIDE 0.9 % IV SOLN
1.0000 g | Freq: Once | INTRAVENOUS | Status: AC
  Administered 2017-01-10: 1 g via INTRAVENOUS
  Filled 2017-01-10: qty 10

## 2017-01-10 MED ORDER — STERILE WATER FOR INJECTION IV SOLN
INTRAVENOUS | Status: DC
  Administered 2017-01-10: 09:00:00 via INTRAVENOUS
  Filled 2017-01-10 (×9): qty 850

## 2017-01-10 NOTE — Consult Note (Signed)
Physical Medicine and Rehabilitation Consult Reason for Consult: Left AKA Referring Physician: Dr. Lemar Livings   HPI: Alison Lynch is a 56 y.o. right handed female with history of hypertension, PTSD, chronic kidney disease stage III, cervical myelopathy, long-term tobacco abuse. Per chart review and patient, patient lives with husband and niece. Independent prior to admission. One level home with 1 step to entry. Family can assist. Presented 01/08/2017 with cold and painful left foot. Patient with recent cut to her right hand was having stitches removed when she became nauseated and received a shot of Phenergan. She returned home began having pain in her entire left lower extremity. She returned to [facility] placed on heparin therapy. She was transferred to [facility] for further evaluation. Patient with no palpable pulses in bilateral lower extremities received bedside duplex and did have patent common femoral arteries bilaterally. Vascular surgery consulted finding of chronic aortic occlusion and there was some acute thrombus on the left. Underwent left lower extremity thromboembolectomy/fasciotomy with exposure of dorsalis pedis artery 01/08/2017. Ongoing ischemic left lower extremity with necrotic muscle and elevated CKs and underwent left AKA 01/09/2017 per Dr. Lemar Livings. Hospital course pain management. Elevated creatinine 1.9 from baseline 1.3-1.5. Nephrology services consulted and placed on bicarbonate drip with evaluation ongoing. Currently maintained on Plavix therapy. Physical and occupational therapy evaluations are pending. M.D. has requested physical medicine rehabilitation consult.   Review of Systems  Constitutional: Negative for chills and fever.  HENT: Negative for hearing loss and tinnitus.   Eyes: Negative for blurred vision and double vision.  Respiratory: Negative for cough.        Shortness of breath with heavy exertion  Cardiovascular: Positive for  leg swelling. Negative for chest pain and palpitations.  Gastrointestinal: Positive for constipation. Negative for nausea and vomiting.  Genitourinary: Negative for dysuria, flank pain and hematuria.  Musculoskeletal: Positive for neck pain.  Skin: Negative for rash.  Neurological: Positive for tingling and headaches. Negative for weakness.  Psychiatric/Behavioral: Positive for depression. The patient has insomnia.        Panic attacks, anxiety  All other systems reviewed and are negative.  Past Medical History:  Diagnosis Date  . Cervical myelopathy (HCC)   . CKD (chronic kidney disease) stage 3, GFR 30-59 ml/min   . Depression   . Dysphagia   . Hx of migraine headaches   . Hyperlipidemia   . Hypertension   . Hypothyroid   . Osteoporosis   . Panic attacks   . PTSD (post-traumatic stress disorder)    History reviewed. No pertinent surgical history. History reviewed. No pertinent family history. Social History:  reports that she has been smoking.  She has never used smokeless tobacco. She reports that she does not drink alcohol or use drugs. Allergies: No Known Allergies Medications Prior to Admission  Medication Sig Dispense Refill  . atorvastatin (LIPITOR) 80 MG tablet Take 80 mg by mouth daily.    . busPIRone (BUSPAR) 10 MG tablet Take 10-15 mg by mouth See admin instructions. Take 1 and 1/2 tablets every morning and take 1 tablet at bedtime    . clindamycin (CLEOCIN) 300 MG capsule Take 300 mg by mouth 3 (three) times daily.    Marland Kitchen gabapentin (NEURONTIN) 300 MG capsule Take 300 mg by mouth 3 (three) times daily.    Marland Kitchen levothyroxine (SYNTHROID, LEVOTHROID) 50 MCG tablet Take 50 mcg by mouth daily before breakfast.    . methocarbamol (ROBAXIN) 750 MG tablet Take 750  mg by mouth every 8 (eight) hours as needed for muscle spasms.    Marland Kitchen sertraline (ZOLOFT) 50 MG tablet Take 50 mg by mouth daily.      Home: Home Living Living Arrangements: Spouse/significant other, Other relatives    Functional History:   Functional Status:  Mobility:          ADL:    Cognition: Cognition Orientation Level: Oriented to person, Oriented to place, Oriented to situation, Disoriented to time    Blood pressure 114/72, pulse 91, temperature 98.9 F (37.2 C), temperature source Oral, resp. rate 16, height 5\' 1"  (1.549 m), weight 52.1 kg (114 lb 13.8 oz), SpO2 99 %. Physical Exam  Vitals reviewed. Constitutional: She is oriented to person, place, and time.  56 year old right-handed female appearing older than stated age.  HENT:  Head: Normocephalic and atraumatic.  Eyes: Conjunctivae and EOM are normal.  Neck: Normal range of motion. Neck supple. No thyromegaly present.  Cardiovascular: Regular rhythm and normal heart sounds.   Tachycardia  Respiratory: Effort normal and breath sounds normal. No respiratory distress.  +[place]  GI: Soft. Bowel sounds are normal. She exhibits no distension. There is no tenderness.  Musculoskeletal: She exhibits edema and tenderness.  Neurological: She is alert and oriented to person, place, and time.  Follow simple commands Motor: B/l UE 4/5 proximal to distal RLE: HF 4-/5, KE 4/5, ADP/PF 5/5 LLE: HF 2/5 (pain inhibition)  Skin: Skin is warm and dry.  Left lower extremity amputation site is dressed appropriately tender  Psychiatric:  Flat affect.  Normal behavior    Results for orders placed or performed during the hospital encounter of 01/08/17 (from the past 24 hour(s))  CK     Status: Abnormal   Collection Time: 01/09/17  7:32 AM  Result Value Ref Range   Total CK 27,243 (H) 38 - 234 U/L  I-STAT 7, (LYTES, BLD GAS, ICA, H+H)     Status: Abnormal   Collection Time: 01/09/17 12:47 PM  Result Value Ref Range   pH, Arterial 7.292 (L) 7.350 - 7.450   pCO2 arterial 34.3 32.0 - 48.0 mmHg   pO2, Arterial 306.0 (H) 83.0 - 108.0 mmHg   Bicarbonate 16.6 (L) 20.0 - 28.0 mmol/L   TCO2 18 0 - 100 mmol/L   O2 Saturation 100.0 %   Acid-base  deficit 9.0 (H) 0.0 - 2.0 mmol/L   Sodium 147 (H) 135 - 145 mmol/L   Potassium 4.2 3.5 - 5.1 mmol/L   Calcium, Ion 0.98 (L) 1.15 - 1.40 mmol/L   HCT 28.0 (L) 36.0 - 46.0 %   Hemoglobin 9.5 (L) 12.0 - 15.0 g/dL   Patient temperature HIDE    Sample type ARTERIAL   CBC     Status: Abnormal   Collection Time: 01/09/17  3:29 PM  Result Value Ref Range   WBC 10.2 4.0 - 10.5 K/uL   RBC 3.42 (L) 3.87 - 5.11 MIL/uL   Hemoglobin 10.2 (L) 12.0 - 15.0 g/dL   HCT 16.1 (L) 09.6 - 04.5 %   MCV 87.4 78.0 - 100.0 fL   MCH 29.8 26.0 - 34.0 pg   MCHC 34.1 30.0 - 36.0 g/dL   RDW 40.9 81.1 - 91.4 %   Platelets 62 (L) 150 - 400 K/uL  Basic metabolic panel     Status: Abnormal   Collection Time: 01/09/17  3:29 PM  Result Value Ref Range   Sodium 143 135 - 145 mmol/L   Potassium 4.0 3.5 - 5.1  mmol/L   Chloride 119 (H) 101 - 111 mmol/L   CO2 17 (L) 22 - 32 mmol/L   Glucose, Bld 126 (H) 65 - 99 mg/dL   BUN 27 (H) 6 - 20 mg/dL   Creatinine, Ser 4.78 (H) 0.44 - 1.00 mg/dL   Calcium 6.3 (LL) 8.9 - 10.3 mg/dL   GFR calc non Af Amer 25 (L) >60 mL/min   GFR calc Af Amer 29 (L) >60 mL/min   Anion gap 7 5 - 15  Glucose, capillary     Status: Abnormal   Collection Time: 01/09/17  4:15 PM  Result Value Ref Range   Glucose-Capillary 125 (H) 65 - 99 mg/dL   Comment 1 Capillary Specimen    Comment 2 Notify RN   Comprehensive metabolic panel     Status: Abnormal   Collection Time: 01/10/17  4:18 AM  Result Value Ref Range   Sodium 140 135 - 145 mmol/L   Potassium 3.7 3.5 - 5.1 mmol/L   Chloride 113 (H) 101 - 111 mmol/L   CO2 20 (L) 22 - 32 mmol/L   Glucose, Bld 110 (H) 65 - 99 mg/dL   BUN 25 (H) 6 - 20 mg/dL   Creatinine, Ser 2.95 (H) 0.44 - 1.00 mg/dL   Calcium 6.3 (LL) 8.9 - 10.3 mg/dL   Total Protein 4.1 (L) 6.5 - 8.1 g/dL   Albumin 2.6 (L) 3.5 - 5.0 g/dL   AST 621 (H) 15 - 41 U/L   ALT 45 14 - 54 U/L   Alkaline Phosphatase 39 38 - 126 U/L   Total Bilirubin 0.7 0.3 - 1.2 mg/dL   GFR calc non Af  Amer 26 (L) >60 mL/min   GFR calc Af Amer 30 (L) >60 mL/min   Anion gap 7 5 - 15  CBC     Status: Abnormal   Collection Time: 01/10/17  4:18 AM  Result Value Ref Range   WBC 12.7 (H) 4.0 - 10.5 K/uL   RBC 3.34 (L) 3.87 - 5.11 MIL/uL   Hemoglobin 10.1 (L) 12.0 - 15.0 g/dL   HCT 30.8 (L) 65.7 - 84.6 %   MCV 86.5 78.0 - 100.0 fL   MCH 30.2 26.0 - 34.0 pg   MCHC 34.9 30.0 - 36.0 g/dL   RDW 96.2 95.2 - 84.1 %   Platelets 65 (L) 150 - 400 K/uL  Magnesium     Status: None   Collection Time: 01/10/17  4:18 AM  Result Value Ref Range   Magnesium 2.1 1.7 - 2.4 mg/dL   No results found.  Assessment/Plan: Diagnosis: Left AKA Labs and images independently reviewed.  Records reviewed and summated above. Clean amputation daily with soap and water Monitor incision site for signs of infection or impending skin breakdown. Staples to remain in place for 3-4 weeks Stump shrinker, for edema control when appropriate Scar mobilization massaging to prevent soft tissue adherence Prevent flexion contractures by implementing the following:   Encourage prone lying for 20-30 mins per day BID to avoid hip flexion contractures if medically appropriate;  Avoid prolonged sitting Post surgical pain control with oral medication Phantom limb pain control with physical modalities including desensitization techniques (gentle self massage to the residual stump,hot packs if sensation intact, [place]) and mirror therapy, TENS. If ineffective, consider pharmacological treatment for neuropathic pain (e.g gabapentin, pregabalin, amytriptalyine, duloxetine).  Avoid injury to contralateral side   1. Does the need for close, 24 hr/day medical supervision in concert with the patient's rehab needs  make it unreasonable for this patient to be served in a less intensive setting? Yes  2. Co-Morbidities requiring supervision/potential complications: HTN (monitor and provide prns in accordance with increased physical exertion and pain),  PTSD (cont meds), AKI on chronic kidney disease stage III (avoid nephrotoxic meds), cervical myelopathy, long-term tobacco abuse (counsel), post-op pain management on chronic pain (Biofeedback training with therapies to help reduce reliance on opiate pain medications, particularly PCA, monitor pain control during therapies, and sedation at rest and titrate to maximum efficacy to ensure participation and gains in therapies), leukocytosis (cont to monitor for signs and symptoms of infection, further workup if indicated), Thrombocytopenia (< 60,000/mm3 no resistive exercise) 3. Due to safety, skin/wound care, disease management, pain management and patient education, does the patient require 24 hr/day rehab nursing? Yes 4. Does the patient require coordinated care of a physician, rehab nurse, PT (1-2 hrs/day, 5 days/week) and OT (1-2 hrs/day, 5 days/week) to address physical and functional deficits in the context of the above medical diagnosis(es)? Yes Addressing deficits in the following areas: balance, endurance, locomotion, strength, transferring, bathing, dressing, toileting and psychosocial support 5. Can the patient actively participate in an intensive therapy program of at least 3 hrs of therapy per day at least 5 days per week? Potentially 6. The potential for patient to make measurable gains while on inpatient rehab is excellent 7. Anticipated functional outcomes upon discharge from inpatient rehab are supervision and min assist  with PT, supervision with OT, n/a with SLP. 8. Estimated rehab length of stay to reach the above functional goals is: 17-20 days. 9. Does the patient have adequate social supports and living environment to accommodate these discharge functional goals? Yes 10. Anticipated D/C setting: Home 11. Anticipated post D/C treatments: HH therapy and Home excercise program 12. Overall Rehab/Functional Prognosis: good  RECOMMENDATIONS: This patient's condition is appropriate for  continued rehabilitative care in the following setting: Will need to await PT eval.  Likely recommend CIR once medically stable and able to tolerate 3 hours therapy/day, however, pt prefers to [place] in [place] her first preference. If this is the case, recommend follow up with PM&R. Patient has agreed to participate in recommended program. Potentially Note that insurance prior authorization may be required for reimbursement for recommended care.  Comment: Rehab Admissions Coordinator to follow up.  Maryla MorrowAnkit Dealie Koelzer, MD, [place] DomFAAPMR Charlton AmorANGIULLI,DANIEL J., PA-C 01/10/2017

## 2017-01-10 NOTE — Progress Notes (Signed)
  Progress Note    01/10/2017 7:44 AM 1 Day Post-Op  Subjective:  Complaining of pain, confused this a.m  Vitals:   01/10/17 0600 01/10/17 0700  BP: 125/66 117/65  Pulse: 93 100  Resp: 16 20  Temp:      Physical Exam: directable Abdomen is soft R groin soft wtihout hematoma R dp/pt signal Left aka site dressing cdi  CBC    Component Value Date/Time   WBC 12.7 (H) 01/10/2017 0418   RBC 3.34 (L) 01/10/2017 0418   HGB 10.1 (L) 01/10/2017 0418   HCT 28.9 (L) 01/10/2017 0418   PLT 65 (L) 01/10/2017 0418   MCV 86.5 01/10/2017 0418   MCH 30.2 01/10/2017 0418   MCHC 34.9 01/10/2017 0418   RDW 14.9 01/10/2017 0418   LYMPHSABS 1.9 01/08/2017 0426   MONOABS 0.6 01/08/2017 0426   EOSABS 0.0 01/08/2017 0426   BASOSABS 0.0 01/08/2017 0426    BMET    Component Value Date/Time   NA 140 01/10/2017 0418   K 3.7 01/10/2017 0418   CL 113 (H) 01/10/2017 0418   CO2 20 (L) 01/10/2017 0418   GLUCOSE 110 (H) 01/10/2017 0418   BUN 25 (H) 01/10/2017 0418   CREATININE 2.09 (H) 01/10/2017 0418   CALCIUM 6.3 (LL) 01/10/2017 0418   GFRNONAA 26 (L) 01/10/2017 0418   GFRAA 30 (L) 01/10/2017 0418    INR    Component Value Date/Time   INR 1.14 01/08/2017 0426     Intake/Output Summary (Last 24 hours) at 01/10/17 0744 Last data filed at 01/10/17 0700  Gross per 24 hour  Intake             6605 ml  Output             2470 ml  Net             4135 ml     Assessment:  56 y.o. female is s/p attempted revascularization of lle now with aka. Acute on chronic renal dysfunction with nephrology following. Platelets 60.  Plan: Replete Ca2+ Holding heparin products, has R scd and sending hitt antibody [facility], dressing down tomorrow.    Rosaly Labarbera C. Randie Heinzain, MD [facility] [place] Office: [phone] Pager: [phone]  01/10/2017 7:44 AM

## 2017-01-10 NOTE — Progress Notes (Signed)
  Echocardiogram 2D Echocardiogram has been performed.  Alison Lynch, Alison Lynch 01/10/2017, 3:30 PM

## 2017-01-10 NOTE — Progress Notes (Signed)
Preliminary results by tech - Renal Duplex Completed. Findings are suggestive of abnormal resistance indices consistent with parenchymal disease. No obvousevidence of renal artery stenosis noted bilaterally. The left kidney appeared smaller in size when compared to the right measuring approximately 8.2 cm.  Marilynne Halstedita Vernita Tague, BS, RDMS, RVT

## 2017-01-10 NOTE — Progress Notes (Signed)
Inpatient Rehabilitation  I met with the patient at the bedside to discuss the recommendation for IP Rehab.  I explained about the IP rehab program and provided informational booklets.  Pt. would like for me to discuss with her husband. She states she has been to rehab in [place] and would not mind going back there but is amenable to IP Rehab program if husband in agreement. I will reach out to her husband to further discuss the possibility of a CIR admission if insurance authorizes.  Please call if questions.  [place] Admissions Coordinator Cell [phone] Office [phone]

## 2017-01-10 NOTE — Progress Notes (Signed)
   01/10/17 1045  Clinical Encounter Type  Visited With Patient  Visit Type Initial  Referral From Chaplain  Consult/Referral To Chaplain  Recommendations (follow up as needed)  Spiritual Encounters  Spiritual Needs Prayer;Emotional  Stress Factors  Patient Stress Factors Health changes;Major life changes  Family Stress Factors None identified  Pt. Requested prayer, loss of leg, emotional support, ministry of presence. Pt has a faith community that has also visited with support and prayer.   Advised to call Spiritual Wellness if need further assistance.  Chaplain Abdimalik Mayorquin A. Christne Platts, MA-PC , BA-REL/PHIL , [phone]

## 2017-01-10 NOTE — Progress Notes (Signed)
Admit: 01/08/2017 LOS: 2  26F with acute ischemic foot s/p PTA, embolectomy fasciotomy x4 and now required L BKA with rhabdo and s/p IV contrast; AKI on CKD ( BL SCr 1.5-1.9)  Subjective:  SCr holding fairly steady Good UOP, >2L x2d Was On NaHCO3 gtt, now back on NS @150mL /hr Awake, alert, to work with PT    02/11 0701 - 02/12 0700 In: 6605 [P.O.:1020; I.V.:3245; Blood:670; IV Piggyback:1670] Out: 2470 [Urine:2320; Drains:100; Blood:50]  Filed Weights   01/08/17 0408 01/08/17 2115  Weight: 51.7 kg (114 lb) 52.1 kg (114 lb 13.8 oz)    Scheduled Meds: . atorvastatin  80 mg Oral Daily  . busPIRone  10 mg Oral QHS  . busPIRone  15 mg Oral q morning - 10a  . clindamycin  300 mg Oral TID  . clopidogrel  300 mg Oral Once  . clopidogrel  75 mg Oral Daily  . docusate sodium  100 mg Oral Daily  . feeding supplement  1 Container Oral TID BM  . gabapentin  300 mg Oral TID  . levothyroxine  50 mcg Oral QAC breakfast  . mouth rinse  15 mL Mouth Rinse BID  . morphine   Intravenous Q4H  . pantoprazole  40 mg Oral Daily  . sertraline  50 mg Oral Daily   Continuous Infusions: . sodium chloride Stopped (01/09/17 1537)   PRN Meds:.sodium chloride, acetaminophen **OR** acetaminophen, ALPRAZolam, alum & mag hydroxide-simeth, bisacodyl, diphenhydrAMINE **OR** diphenhydrAMINE, guaiFENesin-dextromethorphan, hydrALAZINE, HYDROmorphone (DILAUDID) injection, labetalol, LORazepam, magnesium sulfate 1 - 4 g bolus IVPB, methocarbamol, metoprolol, naloxone **AND** sodium chloride flush, ondansetron, oxyCODONE-acetaminophen, phenol, potassium chloride  Current Labs: reviewed    Physical Exam:  Blood pressure 117/65, pulse 100, temperature 98.9 F (37.2 C), temperature source Oral, resp. rate 18, height 5\' 1"  (1.549 m), weight 52.1 kg (114 lb 13.8 oz), SpO2 100 %. NAD Good air mov't b/l RRR RLE no edema  A 1. AKI on CKD3 (BL ~1.6) 2/2 rhabdo + CIN 2. Acute LLE ischemia s/p  BKA 3. Rhabdomyolysis 4. DM2 5. HTN  P 1. Stop NS, return to NaHCO3 at 6975mL/hr 2. Daily weights, Daily Renal Panel, Strict I/Os, Avoid nephrotoxins (NSAIDs, judicious IV Contrast)  3. Hopefully will see recovery in next 24h   Alison Heckyan Jayel Scaduto MD 01/10/2017, 8:16 AM   Recent Labs Lab 01/09/17 0454 01/09/17 1247 01/09/17 1529 01/10/17 0418  NA 141 147* 143 140  K 4.2 4.2 4.0 3.7  CL 116*  --  119* 113*  CO2 17*  --  17* 20*  GLUCOSE 125*  --  126* 110*  BUN 24*  --  27* 25*  CREATININE 1.89*  --  2.14* 2.09*  CALCIUM 6.7*  --  6.3* 6.3*    Recent Labs Lab 01/08/17 0426  01/09/17 0454 01/09/17 1247 01/09/17 1529 01/10/17 0418  WBC 17.1*  < > 9.5  --  10.2 12.7*  NEUTROABS 14.6*  --   --   --   --   --   HGB 13.0  < > 6.8* 9.5* 10.2* 10.1*  HCT 37.9  < > 19.6* 28.0* 29.9* 28.9*  MCV 88.1  < > 85.6  --  87.4 86.5  PLT 238  < > 85*  --  62* 65*  < > = values in this interval not displayed.
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
## 2017-01-10 NOTE — Progress Notes (Signed)
Initial Nutrition Assessment  DOCUMENTATION CODES:   Not applicable  INTERVENTION:    Continue Boost Breeze po TID, each supplement provides 250 kcal and 9 grams of protein  NUTRITION DIAGNOSIS:   Increased nutrient needs related to wound healing as evidenced by estimated needs  GOAL:   Patient will meet greater than or equal to 90% of their needs  MONITOR:   PO intake, Supplement acceptance, Labs, Weight trends, Skin, I & O's  REASON FOR ASSESSMENT:   Malnutrition Screening Tool  ASSESSMENT:   56 yo Female with ischemic left lower extremity with necrotic muscle.  Pt s/p procedure 2/11: LEFT ABOVE KNEE AMPUTATION   Pt reports she was eating well PTA; no recent weight loss. Nutrient needs increased given post-op healing. Boost Breeze nutrition supplements ordered. Labs and medications reviewed. CBG's [id number].  Nutrition focused physical exam completed.  No muscle or subcutaneous fat depletion noticed.  Diet Order:  Diet Heart Room service appropriate? Yes; Fluid consistency: Thin  Skin:  Reviewed, no issues  Last BM:  2/12  Height:   Ht Readings from Last 1 Encounters:  01/08/17 5\' 1"  (1.549 m)    Weight:   Wt Readings from Last 1 Encounters:  01/08/17 114 lb 13.8 oz (52.1 kg)    Ideal Body Weight:  44.5 kg  BMI:  24.1 kg/m2 >> adjusted for AKA  Estimated Nutritional Needs:   Kcal:  1550-1750  Protein:  80-90 gm  Fluid:  1.5-1.7 L  EDUCATION NEEDS:   No education needs identified at this time  Maureen ChattersKatie Celina Shiley, RD, LDN Pager #: [phone] After-Hours Pager #: [phone]
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
## 2017-01-11 LAB — CBC
HEMATOCRIT: 28.6 % — AB (ref 36.0–46.0)
Hemoglobin: 9.6 g/dL — ABNORMAL LOW (ref 12.0–15.0)
MCH: 29.9 pg (ref 26.0–34.0)
MCHC: 33.6 g/dL (ref 30.0–36.0)
MCV: 89.1 fL (ref 78.0–100.0)
Platelets: 83 10*3/uL — ABNORMAL LOW (ref 150–400)
RBC: 3.21 MIL/uL — ABNORMAL LOW (ref 3.87–5.11)
RDW: 14.9 % (ref 11.5–15.5)
WBC: 11.8 10*3/uL — ABNORMAL HIGH (ref 4.0–10.5)

## 2017-01-11 LAB — BASIC METABOLIC PANEL
Anion gap: 11 (ref 5–15)
BUN: 22 mg/dL — ABNORMAL HIGH (ref 6–20)
CHLORIDE: 108 mmol/L (ref 101–111)
CO2: 23 mmol/L (ref 22–32)
Calcium: 7.2 mg/dL — ABNORMAL LOW (ref 8.9–10.3)
Creatinine, Ser: 2.02 mg/dL — ABNORMAL HIGH (ref 0.44–1.00)
GFR calc Af Amer: 31 mL/min — ABNORMAL LOW (ref >60)
GFR calc non Af Amer: 27 mL/min — ABNORMAL LOW (ref >60)
GLUCOSE: 135 mg/dL — AB (ref 65–99)
POTASSIUM: 3.4 mmol/L — AB (ref 3.5–5.1)
SODIUM: 142 mmol/L (ref 135–145)

## 2017-01-11 LAB — HEPARIN INDUCED PLATELET AB (HIT ANTIBODY): Heparin Induced Plt Ab: 0.184 OD (ref 0.000–0.400)

## 2017-01-11 LAB — CK: CK TOTAL: 3022 U/L — AB (ref 38–234)

## 2017-01-11 MED ORDER — SODIUM CHLORIDE 0.9 % IV SOLN
INTRAVENOUS | Status: DC
  Administered 2017-01-12: 12:00:00 via INTRAVENOUS

## 2017-01-11 NOTE — Plan of Care (Signed)
Report called to 2W RN 

## 2017-01-11 NOTE — Progress Notes (Signed)
Admit: 01/08/2017 LOS: 3  32F with acute ischemic foot s/p PTA, embolectomy fasciotomy x4 and now required L BKA with rhabdo and s/p IV contrast; AKI on CKD (BL SCr 1.5-1.9)  Subjective:  3.7L UOP No labs yet Eating and drinking well Asking about recovery / therapy  02/12 0701 - 02/13 0700 In: 3692.5 [P.O.:420; I.V.:3272.5] Out: 3700 [Urine:3700]  Filed Weights   01/08/17 0408 01/08/17 2115 01/11/17 0500  Weight: 51.7 kg (114 lb) 52.1 kg (114 lb 13.8 oz) 52 kg (114 lb 10.2 oz)    Scheduled Meds: . atorvastatin  80 mg Oral Daily  . busPIRone  10 mg Oral QHS  . busPIRone  15 mg Oral q morning - 10a  . clindamycin  300 mg Oral TID  . clopidogrel  75 mg Oral Daily  . docusate sodium  100 mg Oral Daily  . feeding supplement  1 Container Oral TID BM  . gabapentin  300 mg Oral TID  . levothyroxine  50 mcg Oral QAC breakfast  . mouth rinse  15 mL Mouth Rinse BID  . morphine   Intravenous Q4H  . pantoprazole  40 mg Oral Daily  . sertraline  50 mg Oral Daily   Continuous Infusions: . sodium chloride    .  sodium bicarbonate (isotonic) infusion in sterile water Stopped (01/11/17 0808)   PRN Meds:.sodium chloride, acetaminophen **OR** acetaminophen, ALPRAZolam, alum & mag hydroxide-simeth, bisacodyl, diphenhydrAMINE **OR** diphenhydrAMINE, guaiFENesin-dextromethorphan, hydrALAZINE, HYDROmorphone (DILAUDID) injection, labetalol, LORazepam, magnesium sulfate 1 - 4 g bolus IVPB, methocarbamol, metoprolol, naloxone **AND** sodium chloride flush, ondansetron, oxyCODONE-acetaminophen, phenol, potassium chloride  Current Labs: reviewed    Physical Exam:  Blood pressure (!) 106/56, pulse (!) 104, temperature 98.6 F (37 C), temperature source Oral, resp. rate 16, height 5\' 1"  (1.549 m), weight 52 kg (114 lb 10.2 oz), SpO2 (!) 85 %. NAD Good air mov't b/l RRR RLE no edema  A 1. AKI on CKD3 (BL ~1.6) 2/2 rhabdo + CIN 2. Acute LLE ischemia s/p  BKA 3. Rhabdomyolysis 4. DM2 5. HTN 6. TCP, PLT up this AM  P 1. Await AM labs and CK 2. Stop IVFs, eating and drinking well 3. Daily weights, Daily Renal Panel, Strict I/Os, Avoid nephrotoxins (NSAIDs, judicious IV Contrast)    Sabra Heckyan Sanford MD 01/11/2017, 8:13 AM   Recent Labs Lab 01/09/17 0454 01/09/17 1247 01/09/17 1529 01/10/17 0418  NA 141 147* 143 140  K 4.2 4.2 4.0 3.7  CL 116*  --  119* 113*  CO2 17*  --  17* 20*  GLUCOSE 125*  --  126* 110*  BUN 24*  --  27* 25*  CREATININE 1.89*  --  2.14* 2.09*  CALCIUM 6.7*  --  6.3* 6.3*    Recent Labs Lab 01/08/17 0426  01/09/17 1529 01/10/17 0418 01/11/17 0238  WBC 17.1*  < > 10.2 12.7* 11.8*  NEUTROABS 14.6*  --   --   --   --   HGB 13.0  < > 10.2* 10.1* 9.6*  HCT 37.9  < > 29.9* 28.9* 28.6*  MCV 88.1  < > 87.4 86.5 89.1  PLT 238  < > 62* 65* 83*  < > = values in this interval not displayed.

## 2017-01-11 NOTE — Plan of Care (Signed)
1700 Transferred to 2W10 via bed, O2 and monitor. RN to receive in room, SCD's with pt.

## 2017-01-11 NOTE — Progress Notes (Signed)
I was able to speak with pt's husband Lehman PromJimmy Rozycki by phone.  I explained about the IP Rehab program and answered his questions.  He prefers pt. come to CIR for her rehab if we can get insurance authorization and pt. is in agreement.  I will initiate insurance authorization for a potential admission pending authorization, pt's medical readiness and bed availability.  Please call if questions.  Weldon PickingSusan Azhar Yogi PT Inpatient Rehab Admissions Coordinator Cell [phone] Office [phone]

## 2017-01-11 NOTE — Progress Notes (Signed)
  Progress Note    01/11/2017 10:29 AM 2 Days Post-Op  Subjective:  Pain improving  Vitals:   01/11/17 0900 01/11/17 1000  BP: 111/60 (!) 95/Alison  Pulse: (!) 134 (!) 103  Resp: (!) 22 14  Temp:      Physical Exam: aaox3 this a.m. Abdomen is soft R groin soft wtihout hematoma R dp/pt signal Left aka site with staples cdi   CBC    Component Value Date/Time   WBC 11.8 (H) 01/11/2017 0238   RBC 3.21 (L) 01/11/2017 0238   HGB 9.6 (L) 01/11/2017 0238   HCT 28.6 (L) 01/11/2017 0238   PLT 83 (L) 01/11/2017 0238   MCV 89.1 01/11/2017 0238   MCH 29.9 01/11/2017 0238   MCHC 33.6 01/11/2017 0238   RDW 14.9 01/11/2017 0238   LYMPHSABS 1.9 01/08/2017 0426   MONOABS 0.6 01/08/2017 0426   EOSABS 0.0 01/08/2017 0426   BASOSABS 0.0 01/08/2017 0426    BMET    Component Value Date/Time   NA 140 01/10/2017 0418   K 3.7 01/10/2017 0418   CL 113 (H) 01/10/2017 0418   CO2 20 (L) 01/10/2017 0418   GLUCOSE 110 (H) 01/10/2017 0418   BUN 25 (H) 01/10/2017 0418   CREATININE 2.09 (H) 01/10/2017 0418   CALCIUM 6.3 (LL) 01/10/2017 0418   GFRNONAA 26 (L) 01/10/2017 0418   GFRAA 30 (L) 01/10/2017 0418    INR    Component Value Date/Time   INR 1.14 01/08/2017 0426     Intake/Output Summary (Last 24 hours) at 01/11/17 1029 Last data filed at 01/11/17 1000  Gross per 24 hour  Intake             4420 ml  Output             4040 ml  Net              380 ml    Assessment:  Alison Lynch is s/p attempted revascularization of lle now with aka. Acute on chronic renal dysfunction with nephrology following bmp pending today. Pending hit ab.   Plan: F/u bmp Holding heparin products, has R scd and hitt ab pending Appreciate nephrology help Pending transfer to floor    [name] C. Randie Heinzain, MD [facility] Office: [phone] Pager: [phone]  01/11/2017 10:29 AM

## 2017-01-12 ENCOUNTER — Encounter (HOSPITAL_COMMUNITY): Payer: Self-pay | Admitting: Vascular Surgery

## 2017-01-12 LAB — POCT I-STAT 7, (LYTES, BLD GAS, ICA,H+H)
ACID-BASE DEFICIT: 9 mmol/L — AB (ref 0.0–2.0)
Acid-base deficit: 10 mmol/L — ABNORMAL HIGH (ref 0.0–2.0)
BICARBONATE: 16.7 mmol/L — AB (ref 20.0–28.0)
Bicarbonate: 17.1 mmol/L — ABNORMAL LOW (ref 20.0–28.0)
CALCIUM ION: 1.02 mmol/L — AB (ref 1.15–1.40)
Calcium, Ion: 1.01 mmol/L — ABNORMAL LOW (ref 1.15–1.40)
HCT: 27 % — ABNORMAL LOW (ref 36.0–46.0)
HEMATOCRIT: 23 % — AB (ref 36.0–46.0)
HEMOGLOBIN: 7.8 g/dL — AB (ref 12.0–15.0)
Hemoglobin: 9.2 g/dL — ABNORMAL LOW (ref 12.0–15.0)
O2 Saturation: 100 %
O2 Saturation: 100 %
PCO2 ART: 35.8 mmHg (ref 32.0–48.0)
PH ART: 7.272 — AB (ref 7.350–7.450)
PO2 ART: 295 mmHg — AB (ref 83.0–108.0)
Patient temperature: 36.2
Patient temperature: 36.1
Potassium: 3.8 mmol/L (ref 3.5–5.1)
Potassium: 4 mmol/L (ref 3.5–5.1)
SODIUM: 144 mmol/L (ref 135–145)
Sodium: 143 mmol/L (ref 135–145)
TCO2: 18 mmol/L (ref 0–100)
TCO2: 18 mmol/L (ref 0–100)
pCO2 arterial: 34.6 mmHg (ref 32.0–48.0)
pH, Arterial: 7.297 — ABNORMAL LOW (ref 7.350–7.450)
pO2, Arterial: 274 mmHg — ABNORMAL HIGH (ref 83.0–108.0)

## 2017-01-12 LAB — TYPE AND SCREEN
ABO/RH(D): A POS
Antibody Screen: NEGATIVE
UNIT DIVISION: 0
UNIT DIVISION: 0
UNIT DIVISION: 0
Unit division: 0
Unit division: 0
Unit division: 0
Unit division: 0
Unit division: 0
Unit division: 0

## 2017-01-12 LAB — CBC
HCT: 29.1 % — ABNORMAL LOW (ref 36.0–46.0)
Hemoglobin: 9.7 g/dL — ABNORMAL LOW (ref 12.0–15.0)
MCH: 30.3 pg (ref 26.0–34.0)
MCHC: 33.3 g/dL (ref 30.0–36.0)
MCV: 90.9 fL (ref 78.0–100.0)
PLATELETS: 121 10*3/uL — AB (ref 150–400)
RBC: 3.2 MIL/uL — AB (ref 3.87–5.11)
RDW: 14.6 % (ref 11.5–15.5)
WBC: 11.5 10*3/uL — AB (ref 4.0–10.5)

## 2017-01-12 LAB — BASIC METABOLIC PANEL
ANION GAP: 8 (ref 5–15)
BUN: 20 mg/dL (ref 6–20)
CO2: 26 mmol/L (ref 22–32)
Calcium: 7.8 mg/dL — ABNORMAL LOW (ref 8.9–10.3)
Chloride: 108 mmol/L (ref 101–111)
Creatinine, Ser: 1.89 mg/dL — ABNORMAL HIGH (ref 0.44–1.00)
GFR calc non Af Amer: 29 mL/min — ABNORMAL LOW (ref >60)
GFR, EST AFRICAN AMERICAN: 34 mL/min — AB (ref >60)
GLUCOSE: 100 mg/dL — AB (ref 65–99)
POTASSIUM: 3.3 mmol/L — AB (ref 3.5–5.1)
Sodium: 142 mmol/L (ref 135–145)

## 2017-01-12 NOTE — Progress Notes (Signed)
  Progress Note    01/12/2017 8:28 AM 3 Days Post-Op  Subjective:  Pain under better control today  Vitals:   01/12/17 0626 01/12/17 0809  BP: (!) 96/53   Pulse: (!) 101   Resp: 18 13  Temp: 98.1 F (36.7 C)     Physical Exam: aaox3 this a.m. Abdomen is soft R groin soft wtihout hematoma L groin incision cdi R dp/pt multiphasic signal Left aka site with staples cdi   CBC    Component Value Date/Time   WBC 11.8 (H) 01/11/2017 0238   RBC 3.21 (L) 01/11/2017 0238   HGB 9.6 (L) 01/11/2017 0238   HCT 28.6 (L) 01/11/2017 0238   PLT 83 (L) 01/11/2017 0238   MCV 89.1 01/11/2017 0238   MCH 29.9 01/11/2017 0238   MCHC 33.6 01/11/2017 0238   RDW 14.9 01/11/2017 0238   LYMPHSABS 1.9 01/08/2017 0426   MONOABS 0.6 01/08/2017 0426   EOSABS 0.0 01/08/2017 0426   BASOSABS 0.0 01/08/2017 0426    BMET    Component Value Date/Time   NA 142 01/11/2017 1100   K 3.4 (L) 01/11/2017 1100   CL 108 01/11/2017 1100   CO2 23 01/11/2017 1100   GLUCOSE 135 (H) 01/11/2017 1100   BUN 22 (H) 01/11/2017 1100   CREATININE 2.02 (H) 01/11/2017 1100   CALCIUM 7.2 (L) 01/11/2017 1100   GFRNONAA 27 (L) 01/11/2017 1100   GFRAA 31 (L) 01/11/2017 1100    INR    Component Value Date/Time   INR 1.14 01/08/2017 0426     Intake/Output Summary (Last 24 hours) at 01/12/17 0828 Last data filed at 01/12/17 [id number]  Gross per 24 hour  Intake              780 ml  Output             2160 ml  Net            -1380 ml     Assessment: 56 y.o.femaleis s/p attempted revascularization of lle now with aka. Acute on chronic renal dysfunction with nephrology following bmp pending again today. HIT negative with cbc pending.  Plan: F/u bmp and cbc Has right scd, will restart heparin subq pending cbc Appreciate nephrology help Hopeful for CIR approval   Brandon C. Randie Heinzain, MD [facility] Office: [phone] Pager: [phone]  01/12/2017 8:28 AM
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
## 2017-01-12 NOTE — Progress Notes (Signed)
Physical Therapy Treatment Patient Details Name: Alison Lynch MRN: [id number] DOB: Jan 24, 1961 Today's Date: 01/12/2017    History of Present Illness 56 yo female s/p embolectomy fasciotomy x4 and now  s/p L AKA     PT Comments    Pt is up to transition to the chair, and after using pain pump noted 8 level pain was decreasing.  Her O2 sats were low at 84% but had cannula out of her nose.  Returned it and level up to 97% and remained up after sitting bedside with O2 in place.  Continue acutely for standign and gait activities as tolerated, monitor pt for use of O2 and her awareness of safety as she is impulsive with trying to sit at times.  Plan is still to transition to CIR as she is capable of performing the work to transition through the program.  Follow Up Recommendations  CIR;Supervision for mobility/OOB     Equipment Recommendations  3in1 (PT);Wheelchair (measurements PT);Wheelchair cushion (measurements PT)    Recommendations for Other Services       Precautions / Restrictions Precautions Precautions: Fall (telemetry) Restrictions Weight Bearing Restrictions: Yes LLE Weight Bearing: Non weight bearing Other Position/Activity Restrictions: s/p L AKA    Mobility  Bed Mobility Overal bed mobility: Needs Assistance Bed Mobility: Supine to Sit;Rolling Rolling: Mod assist   Supine to sit: Mod assist     General bed mobility comments: used bed rail for support to sit up on L side of bed  Transfers Overall transfer level: Needs assistance Equipment used: Rolling walker (2 wheeled);2 person hand held assist Transfers: Sit to/from [employer] Sit to Stand: Max assist;+2 physical assistance;+2 safety/equipment;From elevated surface Stand pivot transfers: +2 physical assistance;+2 safety/equipment;Max assist;From elevated surface       General transfer comment: needed help to initiate and to control walker  Ambulation/Gait Ambulation/Gait assistance: Mod  assist;Max assist;+2 physical assistance;+2 safety/equipment Ambulation Distance (Feet): 4 Feet Assistive device: Rolling walker (2 wheeled);2 person hand held assist Gait Pattern/deviations:  (sliding technique for moving RLE on walker to chair on L ) Gait velocity: reduced Gait velocity interpretation: Below normal speed for age/gender General Gait Details: Pt very unaware of where she is going on walker, tends to quit quickly and needs dense cues for finishing the task   Stairs            Wheelchair Mobility    Modified Rankin (Stroke Patients Only)       Balance Overall balance assessment: Needs assistance Sitting-balance support: Feet supported;Bilateral upper extremity supported Sitting balance-Leahy Scale: Fair   Postural control: Posterior lean Standing balance support: Bilateral upper extremity supported;During functional activity Standing balance-Leahy Scale: Poor Standing balance comment: using walker a great deal and has fatigue of use of RLE                    Cognition Arousal/Alertness: Awake/alert Behavior During Therapy: WFL for tasks assessed/performed;Anxious Overall Cognitive Status: Impaired/Different from baseline Area of Impairment: Orientation;Awareness;Problem solving;Safety/judgement Orientation Level: Time       Safety/Judgement: Decreased awareness of safety;Decreased awareness of deficits Awareness: Intellectual Problem Solving: Slow processing;Decreased initiation;Difficulty sequencing;Requires verbal cues;Requires tactile cues General Comments: required some cues for breath due to low O2 sats and recovered her levels, but chronically leaves her cannula out of nose    Exercises Amputee Exercises Quad Sets: AROM;Right;10 reps Gluteal Sets: Strengthening;10 reps;Both Hip ABduction/ADduction: AROM;Right;10 reps (L painful)    General Comments General comments (skin integrity, edema, etc.): no dressing on  her stump today       Pertinent Vitals/Pain      Home Living                      Prior Function            PT Goals (current goals can now be found in the care plan section) Acute Rehab PT Goals Patient Stated Goal: to get home Progress towards PT goals: Progressing toward goals    Frequency    Min 3X/week      PT Plan Current plan remains appropriate    Co-evaluation PT/OT/SLP Co-Evaluation/Treatment: Yes Reason for Co-Treatment: Complexity of the patient's impairments (multi-system involvement);For patient/therapist safety PT goals addressed during session: Mobility/safety with mobility;Balance       End of Session Equipment Utilized During Treatment: Gait belt;Oxygen Activity Tolerance: Patient limited by pain;Patient limited by fatigue Patient left: in chair;with call bell/phone within reach;with chair alarm set     Time: [id number] PT Time Calculation (min) (ACUTE ONLY): 29 min  Charges:  $Gait Training: 8-22 mins $Therapeutic Exercise: 8-22 mins                    G Codes:      Ivar DrapeRuth E Heela Lynch 01/12/2017, 12:59 PM   Samul Dadauth Alison Lynch, PT MS Acute Rehab Dept. Number: [facility] [phone] and [facility] [phone]

## 2017-01-12 NOTE — Progress Notes (Signed)
I await [facility] decision for possible admission to inpt rehab once pt medically ready and no longer requires PCA for pain control. I met with pt and family at bedside and they are aware. I will follow up tomorrow. [phone]

## 2017-01-12 NOTE — Progress Notes (Signed)
Occupational Therapy Treatment Patient Details Name: Alison Lynch MRN: [id number] DOB: Oct 06, 1961 Today's Date: 01/12/2017    History of present illness 56 yo female s/p embolectomy fasciotomy x4 and now  s/p L AKA    OT comments  Pt able to perform stand pivot transfer from EOB to chair today with max assist +2 and cues throughout for safety and sequencing. Pt required set up for grooming task sitting in the chair. Continue to recommend CIR for follow up. Will continue to follow acutely.   Follow Up Recommendations  CIR;Supervision - Intermittent    Equipment Recommendations  3 in 1 bedside commode;Wheelchair (measurements OT);Wheelchair cushion (measurements OT)    Recommendations for Other Services      Precautions / Restrictions Precautions Precautions: Fall (telemetry) Restrictions Weight Bearing Restrictions: Yes LLE Weight Bearing: Non weight bearing Other Position/Activity Restrictions: s/p L AKA       Mobility Bed Mobility Overal bed mobility: Needs Assistance Bed Mobility: Supine to Sit;Rolling Rolling: Mod assist   Supine to sit: Mod assist     General bed mobility comments: used bed rail for support to sit up on L side of bed  Transfers Overall transfer level: Needs assistance Equipment used: Rolling walker (2 wheeled);2 person hand held assist Transfers: Sit to/from [employer] Sit to Stand: Max assist;+2 physical assistance;+2 safety/equipment;From elevated surface Stand pivot transfers: +2 physical assistance;+2 safety/equipment;Max assist;From elevated surface       General transfer comment: needed help to initiate and to control walker    Balance Overall balance assessment: Needs assistance Sitting-balance support: Feet supported;Bilateral upper extremity supported Sitting balance-Leahy Scale: Fair   Postural control: Posterior lean Standing balance support: Bilateral upper extremity supported;During functional  activity Standing balance-Leahy Scale: Poor Standing balance comment: using walker a great deal and has fatigue of use of RLE                   ADL Overall ADL's : Needs assistance/impaired     Grooming: Set up;Supervision/safety;Sitting;Oral care                   Toilet Transfer: Maximal assistance;+2 for physical assistance;Stand-pivot;BSC;RW Toilet Transfer Details (indicate cue type and reason): Simulated by sit to stand from EOB with stand pivot to chair. Cues thorughout for safety and sequencing         Functional mobility during ADLs: Maximal assistance;+2 for physical assistance;Rolling walker (for stand pivot only)        Vision                     Perception     Praxis      Cognition   Behavior During Therapy: [facility] for tasks assessed/performed;Anxious Overall Cognitive Status: Impaired/Different from baseline Area of Impairment: Orientation;Awareness;Problem solving;Safety/judgement Orientation Level: Time        Safety/Judgement: Decreased awareness of safety;Decreased awareness of deficits Awareness: Intellectual Problem Solving: Slow processing;Decreased initiation;Difficulty sequencing;Requires verbal cues;Requires tactile cues General Comments: required some cues for breath due to low O2 sats and recovered her levels, but chronically leaves her cannula out of nose    Extremity/Trunk Assessment               Exercises    Shoulder Instructions       General Comments      Pertinent Vitals/ Pain          Home Living   Living Arrangements: Spouse/significant other  Bathroom Accessibility: Yes How Accessible: Accessible via walker        Lives With: Spouse    Prior Functioning/Environment Level of Independence: Independent            Frequency  Min 3X/week        Progress Toward Goals  OT Goals(current goals can now be found in the care plan section)  Progress  towards OT goals: Progressing toward goals  Acute Rehab OT Goals Patient Stated Goal: to get home OT Goal Formulation: With patient  Plan Discharge plan remains appropriate    Co-evaluation    PT/OT/SLP Co-Evaluation/Treatment: Yes Reason for Co-Treatment: Complexity of the patient's impairments (multi-system involvement);For patient/therapist safety PT goals addressed during session: Mobility/safety with mobility;Balance OT goals addressed during session: ADL's and self-care      End of Session Equipment Utilized During Treatment: Gait belt;Rolling walker;Oxygen   Activity Tolerance Patient tolerated treatment well   Patient Left in chair;with call bell/phone within reach;with chair alarm set   Nurse Communication Mobility status        Time: 1047-1110 OT Time Calculation (min): 23 min  Charges: OT General Charges $OT Visit: 1 Procedure OT Treatments $Self Care/Home Management : 8-22 mins  Alison Lynch M.S., OTR/L Pager: [phone]  01/12/2017, 2:37 PM
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
## 2017-01-12 NOTE — Progress Notes (Signed)
Admit: 01/08/2017 LOS: 4  37F with acute ischemic foot s/p PTA, embolectomy fasciotomy x4 and now required L BKA with rhabdo and s/p IV contrast; AKI on CKD (BL SCr 1.5-1.9)  Subjective:  2.5L UOP Anxious this AM Further slight improved in SCr to likely baseline  02/13 0701 - 02/14 0700 In: 1215 [P.O.:1080; I.V.:135] Out: 2510 [Urine:2510]  Filed Weights   01/08/17 2115 01/11/17 0500 01/12/17 0626  Weight: 52.1 kg (114 lb 13.8 oz) 52 kg (114 lb 10.2 oz) 56.4 kg (124 lb 5.4 oz)    Scheduled Meds: . atorvastatin  80 mg Oral Daily  . busPIRone  10 mg Oral QHS  . busPIRone  15 mg Oral q morning - 10a  . clindamycin  300 mg Oral TID  . clopidogrel  75 mg Oral Daily  . docusate sodium  100 mg Oral Daily  . feeding supplement  1 Container Oral TID BM  . gabapentin  300 mg Oral TID  . levothyroxine  50 mcg Oral QAC breakfast  . mouth rinse  15 mL Mouth Rinse BID  . morphine   Intravenous Q4H  . pantoprazole  40 mg Oral Daily  . sertraline  50 mg Oral Daily   Continuous Infusions: . sodium chloride 10 mL/hr at 01/11/17 1600  .  sodium bicarbonate (isotonic) infusion in sterile water Stopped (01/11/17 0808)   PRN Meds:.sodium chloride, acetaminophen **OR** acetaminophen, ALPRAZolam, alum & mag hydroxide-simeth, bisacodyl, diphenhydrAMINE **OR** diphenhydrAMINE, guaiFENesin-dextromethorphan, hydrALAZINE, HYDROmorphone (DILAUDID) injection, labetalol, LORazepam, magnesium sulfate 1 - 4 g bolus IVPB, methocarbamol, metoprolol, naloxone **AND** sodium chloride flush, ondansetron, oxyCODONE-acetaminophen, phenol, potassium chloride  Current Labs: reviewed    Physical Exam:  Blood pressure (!) 96/53, pulse (!) 101, temperature 98.1 F (36.7 C), temperature source Oral, resp. rate 13, height 5\' 1"  (1.549 m), weight 56.4 kg (124 lb 5.4 oz), SpO2 99 %. NAD Good air mov't b/l RRR RLE no edema  A 1. AKI on CKD3 (BL ~1.6) 2/2 rhabdo + CIN; resolving 2. Acute LLE ischemia s/p  BKA 3. Rhabdomyolysis 4. DM2 5. HTN 6. TCP, stable  P 1. Recovered 2. Will sign off for now.  Please call with any questions or concerns.  Pt will need follow up with nephrology when closer to DC  3. Daily weights, Daily Renal Panel, Strict I/Os, Avoid nephrotoxins (NSAIDs, judicious IV Contrast)    Sabra Heckyan Laurie Penado MD 01/12/2017, 8:29 AM   Recent Labs Lab 01/09/17 1529 01/10/17 0418 01/11/17 1100  NA 143 140 142  K 4.0 3.7 3.4*  CL 119* 113* 108  CO2 17* 20* 23  GLUCOSE 126* 110* 135*  BUN 27* 25* 22*  CREATININE 2.14* 2.09* 2.02*  CALCIUM 6.3* 6.3* 7.2*    Recent Labs Lab 01/08/17 0426  01/09/17 1529 01/10/17 0418 01/11/17 0238  WBC 17.1*  < > 10.2 12.7* 11.8*  NEUTROABS 14.6*  --   --   --   --   HGB 13.0  < > 10.2* 10.1* 9.6*  HCT 37.9  < > 29.9* 28.9* 28.6*  MCV 88.1  < > 87.4 86.5 89.1  PLT 238  < > 62* 65* 83*  < > = values in this interval not displayed.

## 2017-01-13 ENCOUNTER — Inpatient Hospital Stay (HOSPITAL_COMMUNITY)
Admission: RE | Admit: 2017-01-13 | Discharge: 2017-01-22 | DRG: 560 | Disposition: A | Discharge status: Home Health | Payer: Medicare HMO | Source: Intra-hospital | Attending: Physical Medicine & Rehabilitation | Admitting: Physical Medicine & Rehabilitation

## 2017-01-13 ENCOUNTER — Inpatient Hospital Stay (HOSPITAL_COMMUNITY): Payer: Medicare HMO

## 2017-01-13 ENCOUNTER — Telehealth: Payer: Self-pay | Admitting: Vascular Surgery

## 2017-01-13 DIAGNOSIS — E876 Hypokalemia: Secondary | ICD-10-CM

## 2017-01-13 DIAGNOSIS — G959 Disease of spinal cord, unspecified: Secondary | ICD-10-CM | POA: Diagnosis present

## 2017-01-13 DIAGNOSIS — R292 Abnormal reflex: Secondary | ICD-10-CM | POA: Diagnosis not present

## 2017-01-13 DIAGNOSIS — I10 Essential (primary) hypertension: Secondary | ICD-10-CM

## 2017-01-13 DIAGNOSIS — F329 Major depressive disorder, single episode, unspecified: Secondary | ICD-10-CM

## 2017-01-13 DIAGNOSIS — N183 Chronic kidney disease, stage 3 unspecified: Secondary | ICD-10-CM | POA: Diagnosis present

## 2017-01-13 DIAGNOSIS — Z4781 Encounter for orthopedic aftercare following surgical amputation: Secondary | ICD-10-CM | POA: Diagnosis present

## 2017-01-13 DIAGNOSIS — Z89612 Acquired absence of left leg above knee: Secondary | ICD-10-CM | POA: Diagnosis not present

## 2017-01-13 DIAGNOSIS — E039 Hypothyroidism, unspecified: Secondary | ICD-10-CM | POA: Diagnosis not present

## 2017-01-13 DIAGNOSIS — M6282 Rhabdomyolysis: Secondary | ICD-10-CM | POA: Diagnosis not present

## 2017-01-13 DIAGNOSIS — T402X5A Adverse effect of other opioids, initial encounter: Secondary | ICD-10-CM

## 2017-01-13 DIAGNOSIS — F17200 Nicotine dependence, unspecified, uncomplicated: Secondary | ICD-10-CM | POA: Diagnosis not present

## 2017-01-13 DIAGNOSIS — D696 Thrombocytopenia, unspecified: Secondary | ICD-10-CM

## 2017-01-13 DIAGNOSIS — E785 Hyperlipidemia, unspecified: Secondary | ICD-10-CM | POA: Diagnosis not present

## 2017-01-13 DIAGNOSIS — Z79899 Other long term (current) drug therapy: Secondary | ICD-10-CM | POA: Diagnosis not present

## 2017-01-13 DIAGNOSIS — F4310 Post-traumatic stress disorder, unspecified: Secondary | ICD-10-CM

## 2017-01-13 DIAGNOSIS — R69 Illness, unspecified: Secondary | ICD-10-CM | POA: Diagnosis not present

## 2017-01-13 DIAGNOSIS — L270 Generalized skin eruption due to drugs and medicaments taken internally: Secondary | ICD-10-CM | POA: Diagnosis not present

## 2017-01-13 DIAGNOSIS — S78112A Complete traumatic amputation at level between left hip and knee, initial encounter: Secondary | ICD-10-CM

## 2017-01-13 DIAGNOSIS — R05 Cough: Secondary | ICD-10-CM | POA: Diagnosis not present

## 2017-01-13 DIAGNOSIS — T368X5A Adverse effect of other systemic antibiotics, initial encounter: Secondary | ICD-10-CM

## 2017-01-13 DIAGNOSIS — I129 Hypertensive chronic kidney disease with stage 1 through stage 4 chronic kidney disease, or unspecified chronic kidney disease: Secondary | ICD-10-CM

## 2017-01-13 DIAGNOSIS — D649 Anemia, unspecified: Secondary | ICD-10-CM | POA: Diagnosis not present

## 2017-01-13 DIAGNOSIS — M4712 Other spondylosis with myelopathy, cervical region: Secondary | ICD-10-CM | POA: Diagnosis not present

## 2017-01-13 DIAGNOSIS — G546 Phantom limb syndrome with pain: Secondary | ICD-10-CM | POA: Diagnosis not present

## 2017-01-13 DIAGNOSIS — I739 Peripheral vascular disease, unspecified: Secondary | ICD-10-CM

## 2017-01-13 HISTORY — DX: Complete traumatic amputation at level between left hip and knee, initial encounter: S78.112A

## 2017-01-13 HISTORY — DX: Acquired absence of left leg above knee: Z89.612

## 2017-01-13 LAB — CBC
HCT: 24.6 % — ABNORMAL LOW (ref 36.0–46.0)
Hemoglobin: 8.2 g/dL — ABNORMAL LOW (ref 12.0–15.0)
MCH: 30.5 pg (ref 26.0–34.0)
MCHC: 33.3 g/dL (ref 30.0–36.0)
MCV: 91.4 fL (ref 78.0–100.0)
Platelets: 142 10*3/uL — ABNORMAL LOW (ref 150–400)
RBC: 2.69 MIL/uL — ABNORMAL LOW (ref 3.87–5.11)
RDW: 14.5 % (ref 11.5–15.5)
WBC: 9.3 10*3/uL (ref 4.0–10.5)

## 2017-01-13 LAB — BASIC METABOLIC PANEL
Anion gap: 8 (ref 5–15)
BUN: 16 mg/dL (ref 6–20)
CALCIUM: 7.7 mg/dL — AB (ref 8.9–10.3)
CO2: 27 mmol/L (ref 22–32)
Chloride: 109 mmol/L (ref 101–111)
Creatinine, Ser: 1.8 mg/dL — ABNORMAL HIGH (ref 0.44–1.00)
GFR calc Af Amer: 36 mL/min — ABNORMAL LOW (ref >60)
GFR calc non Af Amer: 31 mL/min — ABNORMAL LOW (ref >60)
Glucose, Bld: 105 mg/dL — ABNORMAL HIGH (ref 65–99)
Potassium: 3.1 mmol/L — ABNORMAL LOW (ref 3.5–5.1)
Sodium: 144 mmol/L (ref 135–145)

## 2017-01-13 MED ORDER — SERTRALINE HCL 50 MG PO TABS
50.0000 mg | ORAL_TABLET | Freq: Every day | ORAL | Status: DC
  Administered 2017-01-14 – 2017-01-22 (×9): 50 mg via ORAL
  Filled 2017-01-13 (×9): qty 1

## 2017-01-13 MED ORDER — HEPARIN SODIUM (PORCINE) 5000 UNIT/ML IJ SOLN
5000.0000 [IU] | Freq: Three times a day (TID) | INTRAMUSCULAR | Status: DC
  Administered 2017-01-13 – 2017-01-21 (×25): 5000 [IU] via SUBCUTANEOUS
  Filled 2017-01-13 (×25): qty 1

## 2017-01-13 MED ORDER — ACETAMINOPHEN 650 MG RE SUPP: 325.0000 mg | RECTAL | Status: DC | PRN

## 2017-01-13 MED ORDER — ACETAMINOPHEN 325 MG PO TABS: 325.0000 mg | ORAL_TABLET | ORAL | Status: DC | PRN

## 2017-01-13 MED ORDER — PANTOPRAZOLE SODIUM 40 MG PO TBEC
40.0000 mg | DELAYED_RELEASE_TABLET | Freq: Every day | ORAL | Status: DC
  Administered 2017-01-14 – 2017-01-22 (×9): 40 mg via ORAL
  Filled 2017-01-13 (×9): qty 1

## 2017-01-13 MED ORDER — METHOCARBAMOL 750 MG PO TABS
750.0000 mg | ORAL_TABLET | Freq: Three times a day (TID) | ORAL | Status: DC | PRN
  Administered 2017-01-14 – 2017-01-21 (×5): 750 mg via ORAL
  Filled 2017-01-13 (×5): qty 1

## 2017-01-13 MED ORDER — CLINDAMYCIN HCL 300 MG PO CAPS
300.0000 mg | ORAL_CAPSULE | Freq: Three times a day (TID) | ORAL | Status: DC
  Administered 2017-01-13 – 2017-01-15 (×7): 300 mg via ORAL
  Filled 2017-01-13 (×8): qty 1

## 2017-01-13 MED ORDER — OXYCODONE-ACETAMINOPHEN 5-325 MG PO TABS
1.0000 | ORAL_TABLET | ORAL | Status: DC | PRN
  Administered 2017-01-13 – 2017-01-16 (×9): 2 via ORAL
  Filled 2017-01-13 (×9): qty 2

## 2017-01-13 MED ORDER — CLOPIDOGREL BISULFATE 75 MG PO TABS: 75.0000 mg | ORAL_TABLET | Freq: Every day | ORAL | 11 refills | Status: DC

## 2017-01-13 MED ORDER — BUSPIRONE HCL 5 MG PO TABS
15.0000 mg | ORAL_TABLET | Freq: Every morning | ORAL | Status: DC
  Administered 2017-01-14 – 2017-01-22 (×9): 15 mg via ORAL
  Filled 2017-01-13 (×8): qty 1

## 2017-01-13 MED ORDER — BUSPIRONE HCL 10 MG PO TABS
10.0000 mg | ORAL_TABLET | Freq: Every day | ORAL | Status: DC
  Administered 2017-01-13 – 2017-01-21 (×9): 10 mg via ORAL
  Filled 2017-01-13 (×10): qty 1

## 2017-01-13 MED ORDER — ATORVASTATIN CALCIUM 80 MG PO TABS
80.0000 mg | ORAL_TABLET | Freq: Every day | ORAL | Status: DC
  Administered 2017-01-14 – 2017-01-22 (×9): 80 mg via ORAL
  Filled 2017-01-13 (×4): qty 1
  Filled 2017-01-13: qty 2
  Filled 2017-01-13 (×4): qty 1

## 2017-01-13 MED ORDER — ONDANSETRON HCL 4 MG/2ML IJ SOLN: 4.0000 mg | Freq: Four times a day (QID) | INTRAMUSCULAR | Status: DC | PRN

## 2017-01-13 MED ORDER — BISACODYL 10 MG RE SUPP: 10.0000 mg | Freq: Every day | RECTAL | Status: DC | PRN

## 2017-01-13 MED ORDER — SORBITOL 70 % SOLN: 30.0000 mL | Freq: Every day | Status: DC | PRN

## 2017-01-13 MED ORDER — ALPRAZOLAM 0.25 MG PO TABS
0.2500 mg | ORAL_TABLET | Freq: Three times a day (TID) | ORAL | Status: DC | PRN
  Administered 2017-01-14 – 2017-01-19 (×5): 0.25 mg via ORAL
  Filled 2017-01-13 (×5): qty 1

## 2017-01-13 MED ORDER — HEPARIN SODIUM (PORCINE) 5000 UNIT/ML IJ SOLN: 5000.0000 [IU] | Freq: Three times a day (TID) | INTRAMUSCULAR | Status: DC

## 2017-01-13 MED ORDER — CLOPIDOGREL BISULFATE 75 MG PO TABS
75.0000 mg | ORAL_TABLET | Freq: Every day | ORAL | Status: DC
  Administered 2017-01-14 – 2017-01-22 (×9): 75 mg via ORAL
  Filled 2017-01-13 (×9): qty 1

## 2017-01-13 MED ORDER — GABAPENTIN 300 MG PO CAPS
300.0000 mg | ORAL_CAPSULE | Freq: Three times a day (TID) | ORAL | Status: DC
  Administered 2017-01-13 – 2017-01-17 (×11): 300 mg via ORAL
  Filled 2017-01-13 (×11): qty 1

## 2017-01-13 MED ORDER — BOOST / RESOURCE BREEZE PO LIQD
1.0000 | Freq: Three times a day (TID) | ORAL | Status: DC
  Administered 2017-01-13 – 2017-01-22 (×22): 1 via ORAL

## 2017-01-13 MED ORDER — ONDANSETRON HCL 4 MG PO TABS
4.0000 mg | ORAL_TABLET | Freq: Four times a day (QID) | ORAL | Status: DC | PRN
  Administered 2017-01-22: 4 mg via ORAL
  Filled 2017-01-13: qty 1

## 2017-01-13 MED ORDER — HEPARIN SODIUM (PORCINE) 5000 UNIT/ML IJ SOLN
5000.0000 [IU] | Freq: Three times a day (TID) | INTRAMUSCULAR | Status: DC
  Administered 2017-01-13 (×2): 5000 [IU] via SUBCUTANEOUS
  Filled 2017-01-13 (×2): qty 1

## 2017-01-13 MED ORDER — PHENOL 1.4 % MT LIQD: 1.0000 | OROMUCOSAL | Status: DC | PRN

## 2017-01-13 MED ORDER — LEVOTHYROXINE SODIUM 50 MCG PO TABS
50.0000 ug | ORAL_TABLET | Freq: Every day | ORAL | Status: DC
Start: 2017-01-14 — End: 2017-01-22
  Administered 2017-01-14 – 2017-01-22 (×9): 50 ug via ORAL
  Filled 2017-01-13 (×9): qty 1

## 2017-01-13 MED ORDER — DOCUSATE SODIUM 100 MG PO CAPS
100.0000 mg | ORAL_CAPSULE | Freq: Every day | ORAL | Status: DC
  Administered 2017-01-15 – 2017-01-22 (×8): 100 mg via ORAL
  Filled 2017-01-13 (×9): qty 1

## 2017-01-13 NOTE — Care Management Note (Signed)
Case Management Note Donn PieriniKristi Dezra Mandella RN, BSN Unit 2W-Case Manager [phone]  Patient Details  Name: Alison Lynch MRN: [id number] Date of Birth: 02/02/1961  Subjective/Objective:  Pt admitted with ischemic leg-failed attempts at revascularization-  s/p left AKA                  Action/Plan: PTA pt lived at home, Per PT eval, CIR consulted,   Expected Discharge Date:  01/13/17               Expected Discharge Plan:  IP Rehab Facility  In-House Referral:  Clinical Social Work  Discharge planning Services  CM Consult  Post Acute Care Choice:  NA Choice offered to:  NA  DME Arranged:    DME Agency:     HH Arranged:    HH Agency:     Status of Service:  Completed, signed off  If discussed at [employer] Meetings, dates discussed:    Discharge Disposition: IP rehab- CIR   Additional Comments:  01/13/17- 1430- Donn PieriniKristi Jocob Dambach RN, CM- have spoken with Britt[facility]Barbara [facility]- [facility] have insurance auth and have bed available for today- plan will be to d/c pt to CIR later today.   Darrold SpanWebster, Salisa Broz Hall, RN 01/13/2017, 2:32 PM

## 2017-01-13 NOTE — Progress Notes (Signed)
I have insurance approval and bed to admit pt to inpt rehab today. I contacted pt's spouse by phone and he is in agreement. I met with pt at bedside and she is in agreement. I will make the arrangements to admit today. RN CM and SW to be made aware. [phone]

## 2017-01-13 NOTE — Telephone Encounter (Signed)
-----   Message from Sharee PimpleMarilyn K McChesney, RN sent at 01/13/2017 12:14 PM EST ----- Regarding: 4 weeks AKA   ----- Message ----- From: Raymond GurneyKimberly A Trinh, PA-C Sent: 01/13/2017   7:34 AM To: [facility]vs Charge Pool  S/p left AKA 01/09/17  F/u with Dr. Randie Heinzain in 4 weeks  Thanks Selena BattenKim

## 2017-01-13 NOTE — Progress Notes (Signed)
Report given to [facility] and patient transferred by wheelchair.
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
## 2017-01-13 NOTE — Progress Notes (Signed)
Per order, foley has been dc'd. Will monitor for urine output.

## 2017-01-13 NOTE — Progress Notes (Signed)
Ankit Karis Juba, MD Physician Signed Physical Medicine and Rehabilitation  Consult Note Date of Service: 01/10/2017 6:17 AM  Related encounter: ED to Hosp-Admission (Current) from 01/08/2017 in [facility] 2W CARDIAC UNIT     Expand All Collapse All   [] Hide copied text [] Hover for attribution information      Physical Medicine and Rehabilitation Consult Reason for Consult: Left AKA Referring Physician: Dr. Lemar Livings   HPI: Tyyne Cliett is a 56 y.o. right handed female with history of hypertension, PTSD, chronic kidney disease stage III, cervical myelopathy, long-term tobacco abuse. Per chart review and patient, patient lives with husband and niece. Independent prior to admission. One level home with 1 step to entry. Family can assist. Presented 01/08/2017 with cold and painful left foot. Patient with recent cut to her right hand was having stitches removed when she became nauseated and received a shot of Phenergan. She returned home began having pain in her entire left lower extremity. She returned to [facility] placed on heparin therapy. She was transferred to [facility] for further evaluation. Patient with no palpable pulses in bilateral lower extremities received bedside duplex and did have patent common femoral arteries bilaterally. Vascular surgery consulted finding of chronic aortic occlusion and there was some acute thrombus on the left. Underwent left lower extremity thromboembolectomy/fasciotomy with exposure of dorsalis pedis artery 01/08/2017. Ongoing ischemic left lower extremity with necrotic muscle and elevated CKs and underwent left AKA 01/09/2017 per Dr. Lemar Livings. Hospital course pain management. Elevated creatinine 1.9 from baseline 1.3-1.5. Nephrology services consulted and placed on bicarbonate drip with evaluation ongoing. Currently maintained on Plavix therapy. Physical and occupational therapy evaluations are pending. M.D. has  requested physical medicine rehabilitation consult.   Review of Systems  Constitutional: Negative for chills and fever.  HENT: Negative for hearing loss and tinnitus.   Eyes: Negative for blurred vision and double vision.  Respiratory: Negative for cough.        Shortness of breath with heavy exertion  Cardiovascular: Positive for leg swelling. Negative for chest pain and palpitations.  Gastrointestinal: Positive for constipation. Negative for nausea and vomiting.  Genitourinary: Negative for dysuria, flank pain and hematuria.  Musculoskeletal: Positive for neck pain.  Skin: Negative for rash.  Neurological: Positive for tingling and headaches. Negative for weakness.  Psychiatric/Behavioral: Positive for depression. The patient has insomnia.        Panic attacks, anxiety  All other systems reviewed and are negative.      Past Medical History:  Diagnosis Date  . Cervical myelopathy (HCC)   . CKD (chronic kidney disease) stage 3, GFR 30-59 ml/min   . Depression   . Dysphagia   . Hx of migraine headaches   . Hyperlipidemia   . Hypertension   . Hypothyroid   . Osteoporosis   . Panic attacks   . PTSD (post-traumatic stress disorder)    History reviewed. No pertinent surgical history. History reviewed. No pertinent family history. Social History:  reports that she has been smoking.  She has never used smokeless tobacco. She reports that she does not drink alcohol or use drugs. Allergies: No Known Allergies       Medications Prior to Admission  Medication Sig Dispense Refill  . atorvastatin (LIPITOR) 80 MG tablet Take 80 mg by mouth daily.    . busPIRone (BUSPAR) 10 MG tablet Take 10-15 mg by mouth See admin instructions. Take 1 and 1/2 tablets every morning and take 1 tablet at bedtime    .  clindamycin (CLEOCIN) 300 MG capsule Take 300 mg by mouth 3 (three) times daily.    Marland Kitchen. gabapentin (NEURONTIN) 300 MG capsule Take 300 mg by mouth 3 (three) times daily.     Marland Kitchen. levothyroxine (SYNTHROID, LEVOTHROID) 50 MCG tablet Take 50 mcg by mouth daily before breakfast.    . methocarbamol (ROBAXIN) 750 MG tablet Take 750 mg by mouth every 8 (eight) hours as needed for muscle spasms.    Marland Kitchen. sertraline (ZOLOFT) 50 MG tablet Take 50 mg by mouth daily.      Home: Home Living Living Arrangements: Spouse/significant other, Other relatives  Functional History: Functional Status:  Mobility:  ADL:  Cognition: Cognition Orientation Level: Oriented to person, Oriented to place, Oriented to situation, Disoriented to time  Blood pressure 114/72, pulse 91, temperature 98.9 F (37.2 C), temperature source Oral, resp. rate 16, height 5\' 1"  (1.549 m), weight 52.1 kg (114 lb 13.8 oz), SpO2 99 %. Physical Exam  Vitals reviewed. Constitutional: She is oriented to person, place, and time.  56 year old right-handed female appearing older than stated age.  HENT:  Head: Normocephalic and atraumatic.  Eyes: Conjunctivae and EOM are normal.  Neck: Normal range of motion. Neck supple. No thyromegaly present.  Cardiovascular: Regular rhythm and normal heart sounds.   Tachycardia  Respiratory: Effort normal and breath sounds normal. No respiratory distress.  +[place]  GI: Soft. Bowel sounds are normal. She exhibits no distension. There is no tenderness.  Musculoskeletal: She exhibits edema and tenderness.  Neurological: She is alert and oriented to person, place, and time.  Follow simple commands Motor: B/l UE 4/5 proximal to distal RLE: HF 4-/5, KE 4/5, ADP/PF 5/5 LLE: HF 2/5 (pain inhibition)  Skin: Skin is warm and dry.  Left lower extremity amputation site is dressed appropriately tender  Psychiatric:  Flat affect.  Normal behavior    Lab Results Last 24 Hours       Results for orders placed or performed during the hospital encounter of 01/08/17 (from the past 24 hour(s))  CK     Status: Abnormal   Collection Time: 01/09/17  7:32 AM  Result Value  Ref Range   Total CK 27,243 (H) 38 - 234 U/L  I-STAT 7, (LYTES, BLD GAS, ICA, H+H)     Status: Abnormal   Collection Time: 01/09/17 12:47 PM  Result Value Ref Range   pH, Arterial 7.292 (L) 7.350 - 7.450   pCO2 arterial 34.3 32.0 - 48.0 mmHg   pO2, Arterial 306.0 (H) 83.0 - 108.0 mmHg   Bicarbonate 16.6 (L) 20.0 - 28.0 mmol/L   TCO2 18 0 - 100 mmol/L   O2 Saturation 100.0 %   Acid-base deficit 9.0 (H) 0.0 - 2.0 mmol/L   Sodium 147 (H) 135 - 145 mmol/L   Potassium 4.2 3.5 - 5.1 mmol/L   Calcium, Ion 0.98 (L) 1.15 - 1.40 mmol/L   HCT 28.0 (L) 36.0 - 46.0 %   Hemoglobin 9.5 (L) 12.0 - 15.0 g/dL   Patient temperature HIDE    Sample type ARTERIAL   CBC     Status: Abnormal   Collection Time: 01/09/17  3:29 PM  Result Value Ref Range   WBC 10.2 4.0 - 10.5 K/uL   RBC 3.42 (L) 3.87 - 5.11 MIL/uL   Hemoglobin 10.2 (L) 12.0 - 15.0 g/dL   HCT 16.129.9 (L) 09.636.0 - 04.546.0 %   MCV 87.4 78.0 - 100.0 fL   MCH 29.8 26.0 - 34.0 pg   MCHC 34.1 30.0 -  36.0 g/dL   RDW 40.9 81.1 - 91.4 %   Platelets 62 (L) 150 - 400 K/uL  Basic metabolic panel     Status: Abnormal   Collection Time: 01/09/17  3:29 PM  Result Value Ref Range   Sodium 143 135 - 145 mmol/L   Potassium 4.0 3.5 - 5.1 mmol/L   Chloride 119 (H) 101 - 111 mmol/L   CO2 17 (L) 22 - 32 mmol/L   Glucose, Bld 126 (H) 65 - 99 mg/dL   BUN 27 (H) 6 - 20 mg/dL   Creatinine, Ser 7.82 (H) 0.44 - 1.00 mg/dL   Calcium 6.3 (LL) 8.9 - 10.3 mg/dL   GFR calc non Af Amer 25 (L) >60 mL/min   GFR calc Af Amer 29 (L) >60 mL/min   Anion gap 7 5 - 15  Glucose, capillary     Status: Abnormal   Collection Time: 01/09/17  4:15 PM  Result Value Ref Range   Glucose-Capillary 125 (H) 65 - 99 mg/dL   Comment 1 Capillary Specimen    Comment 2 Notify RN   Comprehensive metabolic panel     Status: Abnormal   Collection Time: 01/10/17  4:18 AM  Result Value Ref Range   Sodium 140 135 - 145 mmol/L   Potassium 3.7 3.5  - 5.1 mmol/L   Chloride 113 (H) 101 - 111 mmol/L   CO2 20 (L) 22 - 32 mmol/L   Glucose, Bld 110 (H) 65 - 99 mg/dL   BUN 25 (H) 6 - 20 mg/dL   Creatinine, Ser 9.56 (H) 0.44 - 1.00 mg/dL   Calcium 6.3 (LL) 8.9 - 10.3 mg/dL   Total Protein 4.1 (L) 6.5 - 8.1 g/dL   Albumin 2.6 (L) 3.5 - 5.0 g/dL   AST 213 (H) 15 - 41 U/L   ALT 45 14 - 54 U/L   Alkaline Phosphatase 39 38 - 126 U/L   Total Bilirubin 0.7 0.3 - 1.2 mg/dL   GFR calc non Af Amer 26 (L) >60 mL/min   GFR calc Af Amer 30 (L) >60 mL/min   Anion gap 7 5 - 15  CBC     Status: Abnormal   Collection Time: 01/10/17  4:18 AM  Result Value Ref Range   WBC 12.7 (H) 4.0 - 10.5 K/uL   RBC 3.34 (L) 3.87 - 5.11 MIL/uL   Hemoglobin 10.1 (L) 12.0 - 15.0 g/dL   HCT 08.6 (L) 57.8 - 46.9 %   MCV 86.5 78.0 - 100.0 fL   MCH 30.2 26.0 - 34.0 pg   MCHC 34.9 30.0 - 36.0 g/dL   RDW 62.9 52.8 - 41.3 %   Platelets 65 (L) 150 - 400 K/uL  Magnesium     Status: None   Collection Time: 01/10/17  4:18 AM  Result Value Ref Range   Magnesium 2.1 1.7 - 2.4 mg/dL     Imaging Results (Last 48 hours)  No results found.    Assessment/Plan: Diagnosis: Left AKA Labs and images independently reviewed.  Records reviewed and summated above. Clean amputation daily with soap and water Monitor incision site for signs of infection or impending skin breakdown. Staples to remain in place for 3-4 weeks Stump shrinker, for edema control when appropriate Scar mobilization massaging to prevent soft tissue adherence Prevent flexion contractures by implementing the following:              Encourage prone lying for 20-30 mins per day BID to avoid hip flexion contractures  if medically appropriate;             Avoid prolonged sitting Post surgical pain control with oral medication Phantom limb pain control with physical modalities including desensitization techniques (gentle self massage to the residual stump,hot packs if sensation intact,  [place]) and mirror therapy, TENS. If ineffective, consider pharmacological treatment for neuropathic pain (e.g gabapentin, pregabalin, amytriptalyine, duloxetine).  Avoid injury to contralateral side   1. Does the need for close, 24 hr/day medical supervision in concert with the patient's rehab needs make it unreasonable for this patient to be served in a less intensive setting? Yes  2. Co-Morbidities requiring supervision/potential complications: HTN (monitor and provide prns in accordance with increased physical exertion and pain), PTSD (cont meds), AKI on chronic kidney disease stage III (avoid nephrotoxic meds), cervical myelopathy, long-term tobacco abuse (counsel), post-op pain management on chronic pain (Biofeedback training with therapies to help reduce reliance on opiate pain medications, particularly PCA, monitor pain control during therapies, and sedation at rest and titrate to maximum efficacy to ensure participation and gains in therapies), leukocytosis (cont to monitor for signs and symptoms of infection, further workup if indicated), Thrombocytopenia (< 60,000/mm3 no resistive exercise) 3. Due to safety, skin/wound care, disease management, pain management and patient education, does the patient require 24 hr/day rehab nursing? Yes 4. Does the patient require coordinated care of a physician, rehab nurse, PT (1-2 hrs/day, 5 days/week) and OT (1-2 hrs/day, 5 days/week) to address physical and functional deficits in the context of the above medical diagnosis(es)? Yes Addressing deficits in the following areas: balance, endurance, locomotion, strength, transferring, bathing, dressing, toileting and psychosocial support 5. Can the patient actively participate in an intensive therapy program of at least 3 hrs of therapy per day at least 5 days per week? Potentially 6. The potential for patient to make measurable gains while on inpatient rehab is excellent 7. Anticipated functional outcomes upon  discharge from inpatient rehab are supervision and min assist  with PT, supervision with OT, n/a with SLP. 8. Estimated rehab length of stay to reach the above functional goals is: 17-20 days. 9. Does the patient have adequate social supports and living environment to accommodate these discharge functional goals? Yes 10. Anticipated D/C setting: Home 11. Anticipated post D/C treatments: HH therapy and Home excercise program 12. Overall Rehab/Functional Prognosis: good  RECOMMENDATIONS: This patient's condition is appropriate for continued rehabilitative care in the following setting: Will need to await PT eval.  Likely recommend CIR once medically stable and able to tolerate 3 hours therapy/day, however, pt prefers to be in [employer] as her first preference. If this is the case, recommend follow up with PM&R. Patient has agreed to participate in recommended program. Potentially Note that insurance prior authorization may be required for reimbursement for recommended care.  Comment: Rehab Admissions Coordinator to follow up.  Maryla Morrow, MD, [place] Dom Charlton Amor., PA-C 01/10/2017    Revision History                        Routing History

## 2017-01-13 NOTE — Care Management Important Message (Signed)
Important Message  Patient Details  Name: Alison Fergusonammy Menna MRN: [id number] Date of Birth: 11/01/1961   Medicare Important Message Given:  Yes    Kyla BalzarineShealy, Tavon Magnussen Abena 01/13/2017, 10:41 AM

## 2017-01-13 NOTE — Progress Notes (Signed)
VASCULAR LAB PRELIMINARY  ARTERIAL  ABI completed:    RIGHT    LEFT    PRESSURE WAVEFORM  PRESSURE WAVEFORM  BRACHIAL 122 Triphasic BRACHIAL 123 Triphasic  AT 118 Triphasic AT AKA   PT 123 Triphasic PT AKA     RIGHT LEFT  ABI 1.0 AKA   Right ABI indicates normal arterial flow at rest post operative.  Alaina Donati, RVS 01/13/2017, 11:33 AM

## 2017-01-13 NOTE — Progress Notes (Signed)
  Progress Note    01/13/2017 7:41 AM 4 Days Post-Op  Subjective:  Pain much improved  Vitals:   01/13/17 0400 01/13/17 0552  BP:  138/80  Pulse:  81  Resp: 17 20  Temp:  97.9 F (36.6 C)    Physical Exam: aaox3 Abdomen is soft Right groin soft, left incision cdi Left aka cdi with staples Right foot dp/pt mulitphasic signals  CBC    Component Value Date/Time   WBC 11.5 (H) 01/12/2017 0758   RBC 3.20 (L) 01/12/2017 0758   HGB 9.7 (L) 01/12/2017 0758   HCT 29.1 (L) 01/12/2017 0758   PLT 121 (L) 01/12/2017 0758   MCV 90.9 01/12/2017 0758   MCH 30.3 01/12/2017 0758   MCHC 33.3 01/12/2017 0758   RDW 14.6 01/12/2017 0758   LYMPHSABS 1.9 01/08/2017 0426   MONOABS 0.6 01/08/2017 0426   EOSABS 0.0 01/08/2017 0426   BASOSABS 0.0 01/08/2017 0426    BMET    Component Value Date/Time   NA 142 01/12/2017 0758   K 3.3 (L) 01/12/2017 0758   CL 108 01/12/2017 0758   CO2 26 01/12/2017 0758   GLUCOSE 100 (H) 01/12/2017 0758   BUN 20 01/12/2017 0758   CREATININE 1.89 (H) 01/12/2017 0758   CALCIUM 7.8 (L) 01/12/2017 0758   GFRNONAA 29 (L) 01/12/2017 0758   GFRAA 34 (L) 01/12/2017 0758    INR    Component Value Date/Time   INR 1.14 01/08/2017 0426     Intake/Output Summary (Last 24 hours) at 01/13/17 0741 Last data filed at 01/12/17 1343  Gross per 24 hour  Intake              120 ml  Output             1500 ml  Net            -1380 ml     Assessment:  56 y.o. female is s/p attempted revascularization of lle now s/p left aka.   Plan: D/c foley D/c pca, po pain meds To CIR when bed available   Alexxus Sobh C. Randie Heinzain, MD [facility] [place] Office: [phone] Pager: [phone]  01/13/2017 7:41 AM
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
## 2017-01-13 NOTE — Telephone Encounter (Signed)
LM w/ family member but wants to have a letter mailed as well for appt on 3/16

## 2017-01-13 NOTE — Discharge Summary (Signed)
[facility] Discharge Summary  Alison Lynch 03/02/1961 56 y.o. female  [id number]  Admission Date: 01/08/2017  Discharge Date: 01/13/17  Physician: Maeola HarmanBrandon Christopher Cain*  Admission Diagnosis: Ischemic leg [I99.8]  HPI:   This is a 56 y.o. female  with past history of anxiety, ckd 3 and a long-term smoker but otherwise without significant past medical history. She had a recent cut to her right hand and was having stitches removed when she became nauseated today and then had a Phenergan shot in her left buttocks. Following that she returned home around noon and began having pain in her entire left lower extremity. She attempted to take a bath and sleep but did not alleviate the pain. Ultimately she cannot move the left lower extremity presented to an outside hospital where we were contacted. Heparin has been started she states this has allowed her to regain some motor function but the foot remains numb and left lower extremity is painful. She has never had anything like this before. States that at baseline she can walk without lower extremity limitation. She does have occasional chest pains is never seen a cardiologist.  Hospital Course:  The patient was admitted to the hospital and taken to the operating room on 01/08/2017 and underwent:  1.  US guided cannulation of Right common femoral artery 2.  Open exposure of Left common femoral and below knee popliteal arteries 3.  Aortogram with bilateral lower extremity runoff 4.  Bilateral common iliac stents with 7mm Icast in kissing fashion and extended on right to level of hypogastric artery 5.  Left iliofemoral embolectomy 6.  Left lower extremity embolectomy 7.  Endarterectomy of left below knee popliteal and tibioperoneal trunk with bovine pericardial patch angioplasty 8.  Stent of left popliteal artery with 5 x 10 viabahn  9.  Left leg 4 compartment fasciotomy with placement of wound   Findings: There is a chronic  aortic occlusion and there was some acute thrombus on the left. The left lower extremity there was substantial acute thrombus removed in a retrograde fashion from the common femoral artery. Below the knee there was chronically occluded tibioperoneal trunk with single-vessel runoff via an anterior tibial artery. We were able to stent the chronically occluded aorta and reestablished flow to the bilateral common femoral arteries. On the right she has dominant runoff via the peroneal artery to the level of the ankle. On the left side we established runoff via the anterior tibial artery to the level of the ankle and it was palpable in the mid calf.  The patient tolerated the procedure well and was transported to the recovery room in stable condition. The patient then had a loss of doppler signals in the recovery room that evening and she was taken back to the operating room and underwent:   1. Left lower extremity thromboembolectomy 2.  Exposure of dorsalis pedis artery 3.  Left lower extremity angiogram  Findings: On angiogram contrast cut off at the level of the stent there was no reconstitution distally. From below knee incision we had adequate inflow and returns significant clot from the anterior tibial artery. The level of the DP we also established good inflow but cannot establish any significant outflow. At completion she had a palpable anterior tibial artery at the level of the ankle with little hope of limb salvage.              The patient tolerated the procedure well and was transported to the PACU in stable condition.  She was given a loading dose of Plavix for her iliac and popliteal stents.   POD 1: The patient's CK was elevated with increased creatinine. Her left leg was not viable. Renal was consulted for AKI who placed patient on bicarb gtt.  Dr. Randie Heinz discussed with patient and family regarding AKA versus BKA to avoid further deterioration of renal function. She was receiving 2 units of prbcs  for ABLA.   She was taken to the operating room on 01/09/2017 and underwent: left above knee amputation.  The patient tolerated the procedure well and was transported to the PACU in stable condition.   The patient had thrombocytopenia (62k)  post-op and a hitt panel was sent. Her heparin products were held. Her hitt panel was negative. She had hypocalcemia that was repleted. Her right foot had audible doppler flow. Her left AKA site was healing well. She was stable for transfer to the floor on POD 1. The patient's renal function recovered to baseline on 2/14. Inpatient rehab was consulted and felt patient was a good candidate. Her platelets steadily increased and was at 121k on 2/14. The remainder of her hospitalization consisted on waiting for insurance approval for CIR.   On POD 4, her PCA is discontinued.  She will transfer to CIR.    CBC    Component Value Date/Time   WBC 11.5 (H) 01/12/2017 0758   RBC 3.20 (L) 01/12/2017 0758   HGB 9.7 (L) 01/12/2017 0758   HCT 29.1 (L) 01/12/2017 0758   PLT 121 (L) 01/12/2017 0758   MCV 90.9 01/12/2017 0758   MCH 30.3 01/12/2017 0758   MCHC 33.3 01/12/2017 0758   RDW 14.6 01/12/2017 0758   LYMPHSABS 1.9 01/08/2017 0426   MONOABS 0.6 01/08/2017 0426   EOSABS 0.0 01/08/2017 0426   BASOSABS 0.0 01/08/2017 0426    BMET    Component Value Date/Time   NA 142 01/12/2017 0758   K 3.3 (L) 01/12/2017 0758   CL 108 01/12/2017 0758   CO2 26 01/12/2017 0758   GLUCOSE 100 (H) 01/12/2017 0758   BUN 20 01/12/2017 0758   CREATININE 1.89 (H) 01/12/2017 0758   CALCIUM 7.8 (L) 01/12/2017 0758   GFRNONAA 29 (L) 01/12/2017 0758   GFRAA 34 (L) 01/12/2017 0758     Discharge Instructions:   The patient is discharged to CIR with extensive instructions on wound care and progressive ambulation.  They are instructed not to drive or perform any heavy lifting until returning to see the physician in his office.  Discharge Instructions    Ambulatory referral to  Physical Medicine Rehab    Complete by:  As directed    AKA follow up   Call MD for:  redness, tenderness, or signs of infection (pain, swelling, bleeding, redness, odor or green/yellow discharge around incision site)    Complete by:  As directed    Call MD for:  severe or increased pain, loss or decreased feeling  in affected limb(s)    Complete by:  As directed    Call MD for:  temperature >100.5    Complete by:  As directed    Discharge wound care:    Complete by:  As directed    Wash left stapled incision daily with soap and water and pat dry. Wrap with kerlix gauze wrap and ACE daily.   Increase activity slowly    Complete by:  As directed    Resume previous diet    Complete by:  As directed  Discharge Diagnosis:  Ischemic leg [I99.8]  Secondary Diagnosis: Patient Active Problem List   Diagnosis Date Noted  . Unilateral AKA, left (HCC)   . Benign essential HTN   . PTSD (post-traumatic stress disorder)   . AKI (acute kidney injury) (HCC)   . Stage 3 chronic kidney disease   . Post-operative pain   . Cervical myelopathy (HCC)   . Chronic pain syndrome   . Leukocytosis   . Thrombocytopenia (HCC)   . Ischemia of lower extremity 01/08/2017  . Ischemia of left lower extremity 01/08/2017   Past Medical History:  Diagnosis Date  . Cervical myelopathy (HCC)   . CKD (chronic kidney disease) stage 3, GFR 30-59 ml/min   . Depression   . Dysphagia   . Hx of migraine headaches   . Hyperlipidemia   . Hypertension   . Hypothyroid   . Osteoporosis   . Panic attacks   . PTSD (post-traumatic stress disorder)      Allergies as of 01/13/2017   No Known Allergies     Medication List    TAKE these medications   atorvastatin 80 MG tablet Commonly known as:  LIPITOR Take 80 mg by mouth daily.   busPIRone 10 MG tablet Commonly known as:  BUSPAR Take 10-15 mg by mouth See admin instructions. Take 1 and 1/2 tablets every morning and take 1 tablet at bedtime     clindamycin 300 MG capsule Commonly known as:  CLEOCIN Take 300 mg by mouth 3 (three) times daily.   clopidogrel 75 MG tablet Commonly known as:  PLAVIX Take 1 tablet (75 mg total) by mouth daily.   gabapentin 300 MG capsule Commonly known as:  NEURONTIN Take 300 mg by mouth 3 (three) times daily.   levothyroxine 50 MCG tablet Commonly known as:  SYNTHROID, LEVOTHROID Take 50 mcg by mouth daily before breakfast.   methocarbamol 750 MG tablet Commonly known as:  ROBAXIN Take 750 mg by mouth every 8 (eight) hours as needed for muscle spasms.   sertraline 50 MG tablet Commonly known as:  ZOLOFT Take 50 mg by mouth daily.       Disposition: CIR  Patient's condition: is Good  Follow up: 1. Dr. Randie Heinz in 4 weeks   Maris Berger, PA-C [facility] [phone] 01/13/2017  7:30 AM  - For VQI Registry use --- Instructions: Press F2 to tab through selections.  Delete question if not applicable.   Post-op:  Wound infection: No   Transfusion: Yes  If yes, 2 units given New Arrhythmia: No D/C Ambulatory Status: Bedridden  Complications: MI: No, [ ]  Troponin only, [ ]  EKG or Clinical CHF: No Resp failure:No, [ ]  Pneumonia, [ ]  Ventilator Chg in renal function: Yes, [ x] Inc. Cr > 0.5, [ ]  Temp. Dialysis, [ ]  Permanent dialysis Stroke: No, [ ]  Minor, [ ]  Major Return to OR: No  Reason for return to OR: [ ]  Bleeding, [ ]  Infection, [ ]  Thrombosis, [ ]  Revision  Discharge medications: Statin use:  yes ASA use:  no Plavix use:  yes Beta blocker use: no Coumadin use: no

## 2017-01-14 ENCOUNTER — Inpatient Hospital Stay (HOSPITAL_COMMUNITY): Payer: Medicare HMO | Admitting: Occupational Therapy

## 2017-01-14 ENCOUNTER — Inpatient Hospital Stay (HOSPITAL_COMMUNITY): Payer: Medicare HMO | Admitting: Physical Therapy

## 2017-01-14 LAB — COMPREHENSIVE METABOLIC PANEL
ALT: 26 U/L (ref 14–54)
AST: 32 U/L (ref 15–41)
Albumin: 2.1 g/dL — ABNORMAL LOW (ref 3.5–5.0)
Alkaline Phosphatase: 68 U/L (ref 38–126)
Anion gap: 9 (ref 5–15)
BUN: 15 mg/dL (ref 6–20)
CO2: 28 mmol/L (ref 22–32)
CREATININE: 1.7 mg/dL — AB (ref 0.44–1.00)
Calcium: 8.3 mg/dL — ABNORMAL LOW (ref 8.9–10.3)
Chloride: 107 mmol/L (ref 101–111)
GFR calc Af Amer: 39 mL/min — ABNORMAL LOW (ref >60)
GFR, EST NON AFRICAN AMERICAN: 33 mL/min — AB (ref >60)
Glucose, Bld: 99 mg/dL (ref 65–99)
Potassium: 2.9 mmol/L — ABNORMAL LOW (ref 3.5–5.1)
Sodium: 144 mmol/L (ref 135–145)
TOTAL PROTEIN: 5.1 g/dL — AB (ref 6.5–8.1)
Total Bilirubin: 1 mg/dL (ref 0.3–1.2)

## 2017-01-14 LAB — CBC WITH DIFFERENTIAL/PLATELET
BASOS ABS: 0 10*3/uL (ref 0.0–0.1)
Basophils Relative: 0 %
Eosinophils Absolute: 0.5 10*3/uL (ref 0.0–0.7)
Eosinophils Relative: 7 %
HCT: 24.5 % — ABNORMAL LOW (ref 36.0–46.0)
Hemoglobin: 8 g/dL — ABNORMAL LOW (ref 12.0–15.0)
LYMPHS ABS: 1.6 10*3/uL (ref 0.7–4.0)
LYMPHS PCT: 22 %
MCH: 30.1 pg (ref 26.0–34.0)
MCHC: 32.7 g/dL (ref 30.0–36.0)
MCV: 92.1 fL (ref 78.0–100.0)
Monocytes Absolute: 0.9 10*3/uL (ref 0.1–1.0)
Monocytes Relative: 12 %
NEUTROS ABS: 4 10*3/uL (ref 1.7–7.7)
Neutrophils Relative %: 59 %
Platelets: 207 10*3/uL (ref 150–400)
RBC: 2.66 MIL/uL — ABNORMAL LOW (ref 3.87–5.11)
RDW: 14.2 % (ref 11.5–15.5)
WBC: 6.9 10*3/uL (ref 4.0–10.5)

## 2017-01-14 MED ORDER — GUAIFENESIN ER 600 MG PO TB12
600.0000 mg | ORAL_TABLET | Freq: Two times a day (BID) | ORAL | Status: DC
  Administered 2017-01-14 – 2017-01-22 (×17): 600 mg via ORAL
  Filled 2017-01-14 (×17): qty 1

## 2017-01-14 MED ORDER — POTASSIUM CHLORIDE CRYS ER 20 MEQ PO TBCR
20.0000 meq | EXTENDED_RELEASE_TABLET | Freq: Two times a day (BID) | ORAL | Status: AC
  Administered 2017-01-14 – 2017-01-16 (×6): 20 meq via ORAL
  Filled 2017-01-14 (×7): qty 1

## 2017-01-14 MED ORDER — HYDROCORTISONE 1 % EX CREA
TOPICAL_CREAM | Freq: Three times a day (TID) | CUTANEOUS | Status: DC | PRN
  Administered 2017-01-14: 22:00:00 via TOPICAL
  Filled 2017-01-14: qty 28

## 2017-01-14 NOTE — Progress Notes (Signed)
Occupational Therapy Session Note  Patient Details  Name: Milton Fergusonammy Tondreau MRN: [id number] Date of Birth: 03/24/1961  Today's Date: 01/14/2017 OT Individual Time: 1350-1450 OT Individual Time Calculation (min): 60 min    Short Term Goals: Week 1:  OT Short Term Goal 1 (Week 1): Pt will complete LB bathing sit to stand with supervision. OT Short Term Goal 2 (Week 1): Pt will complete LB dressing sit to stand with supervision. OT Short Term Goal 3 (Week 1): Pt will complete toilet transfer to the 3:1 with use of the RW with supervision. OT Short Term Goal 4 (Week 1): Pt will complete simulated walk-in shower transfer with use of the RW with supervision.   Skilled Therapeutic Interventions/Progress Updates:   Pt completed wheelchair mobility to the gym with supervision and one rest break.  Once in the gym had her complete stand pivot transfer to the therapy mat with min assist and mod demonstrational cueing for hand placement and technique to complete transfer.  Worked on sit to stand from the therapy mat with min assist, encouraging correct hand placement for sit to stand and stand to sit.  Pt gets confused at times and anxious with this.  Had her work on standing while alternating UE use to stack cups on bedside table.  Min assist for balance while completing task.  Therapist also re-wrapped LLE as gauze and ace bandage both came off with sit to stand transition.  Pt transferred back to the wheelchair with min assist and therapist roller her back to the room.  She completed toilet [facility] elevated toilet with min assist.  Had her ambulate to the bed from the bathroom with initial min assist but required mod assist to complete as she fatigued once she got up to the bed and could not support her weight through the RLE as much as she could initially.  Pt transferred to supine to complete session.  Call button and phone in reach.    Therapy Documentation Precautions:   Precautions Precautions: Fall Restrictions Weight Bearing Restrictions: Yes LLE Weight Bearing: Non weight bearing  Pain: Pain Assessment Pain Assessment: Faces Faces Pain Scale: Hurts a little bit Pain Type: Surgical pain Pain Location: Leg Pain Orientation: Left Pain Onset: With Activity Pain Intervention(s): Repositioned;Emotional support ADL: See Function Navigator for Current Functional Status.   Therapy/Group: Individual Therapy  Caytlyn Evers OTR/L 01/14/2017, 4:48 PM

## 2017-01-14 NOTE — Progress Notes (Signed)
[facility]     PROGRESS NOTE    Subjective/Complaints: Had a reasonable night. Noticing some coughing/wheezing overnight. No sob  ROS: pt denies nausea, vomiting, diarrhea, shortness of breath or chest pain   Objective: Vital Signs: Blood pressure (!) 90/52, pulse 77, temperature 97.6 F (36.4 C), temperature source Oral, resp. rate 16, height 5\' 1"  (1.549 m), weight 60.1 kg (132 lb 9.6 oz), SpO2 93 %. No results found.  Recent Labs  01/13/17 0758 01/14/17 0514  WBC 9.3 6.9  HGB 8.2* 8.0*  HCT 24.6* 24.5*  PLT 142* 207    Recent Labs  01/13/17 0758 01/14/17 0514  NA 144 144  K 3.1* 2.9*  CL 109 107  GLUCOSE 105* 99  BUN 16 15  CREATININE 1.80* 1.70*  CALCIUM 7.7* 8.3*   CBG (last 3)  No results for input(s): GLUCAP in the last 72 hours.  Wt Readings from Last 3 Encounters:  01/14/17 60.1 kg (132 lb 9.6 oz)  01/13/17 56.9 kg (125 lb 7.1 oz)    Physical Exam:  HENT:  Head: Normocephalic.  Eyes: Left eye exhibits no discharge.  Neck: Normal range of motion. Neck supple. No thyromegalypresent.  Cardiovascular:RRR + murmur.  Respiratory: normal effort. No wheezing or rales.  GI: Soft. Bowel sounds are normal. She exhibits no distension.  Musculoskeletal: She exhibits edema(left AKA). Wearing ACE today Psychiatric: She has a normal mood and affect. Her behavior is normal. Thought contentnormal.  Skin. AKA wound clean, mild serosang drainage. Small ischemic areas Neurological: She is alertand oriented to person, place, and time.  Follow simple commands Motor: B/L UE are 4 to 4+/5 proximal to distal.  RLE: HF 3+/5, KE 4/5, ADP/PF 5/5 LLE: HF 2 to 3-/5 continued pain inhibition Decreased sensation in the RLE.  Assessment/Plan: 1. Functional deficits secondary to left AKA which require 3+ hours per day of interdisciplinary therapy in a comprehensive inpatient rehab setting. Physiatrist is providing close team  supervision and 24 hour management of active medical problems listed below. Physiatrist and rehab team continue to assess barriers to discharge/monitor patient progress toward functional and medical goals.  Function:  Bathing Bathing position      Bathing parts      Bathing assist        Upper Body Dressing/Undressing Upper body dressing                    Upper body assist        Lower Body Dressing/Undressing Lower body dressing                                  Lower body assist        Toileting Toileting          Toileting assist     Transfers Chair/bed transfer             Locomotion Ambulation           Wheelchair          Cognition Comprehension Comprehension assist level: Follows complex conversation/direction with extra time/assistive device  Expression Expression assist level: Expresses complex ideas: With extra time/assistive device  Social Interaction Social Interaction assist level: Interacts appropriately with others with medication or extra time (anti-anxiety, antidepressant).  Problem Solving Problem solving assist level: Solves complex problems: Recognizes & self-corrects  Memory Memory assist level: Complete Independence: No helper   Medical Problem  List and Plan: 1. Decreased functional mobilitysecondary to left AKA 01/09/2017 after failed thromboembolectomy/fasciotomy -begin CIR therapies. She is motivated!! 2. DVT Prophylaxis/Anticoagulation: SQ Heparin.Monitor for any bleeding episodes 3. Pain Management: Neurontin 300 mg 3 times a day, Robaxin and Percocet as needed  -continue ACE wrap for edema control/protection 4. Mood: Zoloft 50 mg daily, BuSpar 15 mg every a.m. and 10 mg daily at bedtime, Xanax 0.25 mg 3 times a day as needed 5. Neuropsych: This patient iscapable of making decisions on herown behalf. 6. Skin/Wound Care: Routine skin checks -can continue with ACE for now along  with DSD as long it is able to stay on limb 7. Fluids/Electrolytes/Nutrition:   -I personally reviewed the patient's labs today.   -replete K+  -encourage PO 8.Acute on chronic anemia/thrombocytopenia.   -hgb down to 8.2---recheck on Monday--no signs of major blood loss clinically  -platelets trending up 9.CKD stage III/rhabdo. Baseline creatinine around 1.6 follow-up renal services as needed.  -BUN/Cr decr to 16/1.8 today 10.Hyperlipidemia. Lipitor 11.Hypothyroidism. Synthroid 12. Cough--mucinex  -IS,OOB LOS (Days) 1 A FACE TO FACE EVALUATION WAS PERFORMED  Ranelle Oyster, MD 01/14/2017 10:25 AM
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
## 2017-01-14 NOTE — Progress Notes (Signed)
  Progress Note    01/14/2017 4:01 PM * No surgery found *  Subjective:  Doing well with rehab, pain controlled  Vitals:   01/14/17 0426 01/14/17 1553  BP: (!) 90/52 (!) 122/56  Pulse: 77 88  Resp: 16 18  Temp: 97.6 F (36.4 C) 99.3 F (37.4 C)    Physical Exam: aaox3 Left residual limb with dressing cdi R foot is warm  CBC    Component Value Date/Time   WBC 6.9 01/14/2017 0514   RBC 2.66 (L) 01/14/2017 0514   HGB 8.0 (L) 01/14/2017 0514   HCT 24.5 (L) 01/14/2017 0514   PLT 207 01/14/2017 0514   MCV 92.1 01/14/2017 0514   MCH 30.1 01/14/2017 0514   MCHC 32.7 01/14/2017 0514   RDW 14.2 01/14/2017 0514   LYMPHSABS 1.6 01/14/2017 0514   MONOABS 0.9 01/14/2017 0514   EOSABS 0.5 01/14/2017 0514   BASOSABS 0.0 01/14/2017 0514    BMET    Component Value Date/Time   NA 144 01/14/2017 0514   K 2.9 (L) 01/14/2017 0514   CL 107 01/14/2017 0514   CO2 28 01/14/2017 0514   GLUCOSE 99 01/14/2017 0514   BUN 15 01/14/2017 0514   CREATININE 1.70 (H) 01/14/2017 0514   CALCIUM 8.3 (L) 01/14/2017 0514   GFRNONAA 33 (L) 01/14/2017 0514   GFRAA 39 (L) 01/14/2017 0514    INR    Component Value Date/Time   INR 1.14 01/08/2017 0426     Intake/Output Summary (Last 24 hours) at 01/14/17 1601 Last data filed at 01/14/17 0430  Gross per 24 hour  Intake                0 ml  Output                3 ml  Net               -3 ml     Assessment:  56 y.o. female is s/p left aka now in CIR  Plan: Will f/u in office for staple removal Please call with questions  Aidian Salomon C. Randie Heinzain, MD [facility] Office: [phone] Pager: [phone]  01/14/2017 4:01 PM

## 2017-01-14 NOTE — Care Management Note (Signed)
Inpatient Rehabilitation Center Individual Statement of Services  Patient Name:  Alison Lynch  Date:  01/14/2017  Welcome to the Inpatient Rehabilitation Center.  Our goal is to provide you with an individualized program based on your diagnosis and situation, designed to meet your specific needs.  With this comprehensive rehabilitation program, you will be expected to participate in at least 3 hours of rehabilitation therapies Monday-Friday, with modified therapy programming on the weekends.  Your rehabilitation program will include the following services:  Physical Therapy (PT), Occupational Therapy (OT), 24 hour per day rehabilitation nursing, Therapeutic Recreaction (TR), Neuropsychology, Case Management (Social Worker), Rehabilitation Medicine, Nutrition Services and Pharmacy Services  Weekly team conferences will be held on Wednesday to discuss your progress.  Your Social Worker will talk with you frequently to get your input and to update you on team discussions.  Team conferences with you and your family in attendance may also be held.  Expected length of stay: 10-14 days  Overall anticipated outcome: supervision with cueing  Depending on your progress and recovery, your program may change. Your Social Worker will coordinate services and will keep you informed of any changes. Your Social Worker's name and contact numbers are listed  below.  The following services may also be recommended but are not provided by the Inpatient Rehabilitation Center:    Home Health Rehabiltiation Services  Outpatient Rehabilitation Services    Arrangements will be made to provide these services after discharge if needed.  Arrangements include referral to agencies that provide these services.  Your insurance has been verified to be:  [employer] Medicare & Medicaid Your primary doctor is:  Rondel OhLaura Baxley-NP  Pertinent information will be shared with your doctor and your insurance company.  Social Worker:   Dossie DerBecky Airam Heidecker, SW [phone] or ([phone]) [phone]  Information discussed with and copy given to patient by: Lucy Chrisupree, Jatorian Renault G, 01/14/2017, 10:22 AM
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
## 2017-01-15 ENCOUNTER — Inpatient Hospital Stay (HOSPITAL_COMMUNITY): Payer: Medicare HMO | Admitting: Physical Therapy

## 2017-01-15 ENCOUNTER — Inpatient Hospital Stay (HOSPITAL_COMMUNITY): Payer: Medicare HMO | Admitting: Occupational Therapy

## 2017-01-15 MED ORDER — DIPHENHYDRAMINE HCL 25 MG PO CAPS
25.0000 mg | ORAL_CAPSULE | Freq: Four times a day (QID) | ORAL | Status: DC | PRN
  Administered 2017-01-15: 25 mg via ORAL
  Filled 2017-01-15 (×2): qty 1

## 2017-01-15 MED ORDER — CAMPHOR-MENTHOL 0.5-0.5 % EX LOTN
TOPICAL_LOTION | CUTANEOUS | Status: DC | PRN
  Filled 2017-01-15: qty 222

## 2017-01-15 NOTE — Progress Notes (Signed)
Orthopedic Tech Progress Note Patient Details:  Alison Lynch Frazer 07/06/1961 [id number]  Patient ID: Alison Lynch Damiani, female   DOB: 08/30/1961, 56 y.o.   MRN: [id number]   Nikki DomCrawford, Simone Rodenbeck 01/15/2017, 1:15 PM Called in advanced brace order; spoke with the answering service

## 2017-01-15 NOTE — Progress Notes (Signed)
Occupational Therapy Session Note  Patient Details  Name: Alison Lynch MRN: [id number] Date of Birth: 08/13/1961  Today's Date: 01/15/2017 OT Individual Time: [id number] OT Individual Time Calculation (min): 59 min    Short Term Goals: Week 1:  OT Short Term Goal 1 (Week 1): Pt will complete LB bathing sit to stand with supervision. OT Short Term Goal 2 (Week 1): Pt will complete LB dressing sit to stand with supervision. OT Short Term Goal 3 (Week 1): Pt will complete toilet transfer to the 3:1 with use of the RW with supervision. OT Short Term Goal 4 (Week 1): Pt will complete simulated walk-in shower transfer with use of the RW with supervision.   Skilled Therapeutic Interventions/Progress Updates: Pt was sitting in w/c at time of arrival, agreeable to session. C/o pain with RN notified to provide medication. Tx focus on sit<stand transitions, proper hand placement during those transitions, and standing endurance while completing simulated clothing mgt activities. Pt self propelled to dayroom for UB strengthening. Once in dayroom, practiced sit<stand at supportive table while using theraband to simulate pants. Pt able to interchange unilateral support to lift band over hips. Activity upgraded to have pt complete this  standing with RW. Pt required consistent Min A but improved with carryover of knowledge regarding proper hand placement during transitions. Table washing activity then completed w/c level to improve w/c mobility/safety, and activity tolerance. She then self propelled back to room in manner as written above. Completed stand pivot transfer to toilet with Min A, lowered clothing with Min A for balance. Pt was left on toilet because she wanted to try for BM. Pt reminded to use call button with verbalized understanding. RN and nurse tech made aware of pts position.      Therapy Documentation Precautions:  Precautions Precautions: Fall Restrictions Weight Bearing Restrictions:  Yes LLE Weight Bearing: Weight bearing as tolerated   Pain: Pt medicated prior to session  Pain Assessment Pain Assessment: 0-10 Pain Score: 7  Pain Type: Surgical pain;Phantom pain Pain Location: Leg (AKA) Pain Orientation: Left Pain Descriptors / Indicators: Aching Pain Frequency: Constant Pain Onset: On-going Patients Stated Pain Goal: 0 Pain Intervention(s): Medication (See eMAR) Multiple Pain Sites: No ADL:  :    See Function Navigator for Current Functional Status.   Therapy/Group: Individual Therapy  Lannah Koike A Eartha Vonbehren 01/15/2017, 12:47 PM

## 2017-01-15 NOTE — Progress Notes (Signed)
Physical Therapy Session Note  Patient Details  Name: Alison Lynch MRN: [id number] Date of Birth: 03-29-1961  Today's Date: 01/15/2017 PT Individual Time: 0905-1000 AND 1546-1630 PT Individual Time Calculation (min): 55 min AND 59mn   Short Term Goals: Week 1:  PT Short Term Goal 1 (Week 1): Patient will perform transfers with min assist  PT Short Term Goal 2 (Week 1): Patient will perform WC mobility with supervision assist.  PT Short Term Goal 3 (Week 1): Gait with RW x 22fwith min assist.   Skilled Therapeutic Interventions/Progress Updates:  Session 1:  Pt received supine in bed and agreeable to PT. Supine>sit transfer with supervision assist and min cues for safety.   Dressing EOB with lateral lean technique and Supervision assist for safety with lateral weight shifting.   Squat pivot transfer completed x 5 throughout treatment with min assist and cues for head/hips relationahip and proper UE placement.   Gait training with RW x 2013fnd min assist from PT.   Supine and prone therex including hip extension in prone, glute sets in supine, hip abduction in supine. All complete in 2 bouts of 10.   Rolling R and L to prone with supervision assist with min cues for proper UE placement and minimized WB on residual limb.   Patient returned too room and [facility] reach and all needs met.    Session 2.   Pt received sitting in WC and agreeable to PT  WC mobility to rehab gym x [id number] supervision assist from PT to manage doorways and reduce turning radius.   Squat pivot transfer completed x 2 with min assist progressing to supervision assist and min cues for technique and proper UE placement.   Supine and sidelying therex including SLR, heel slides and Bridges on the RLE and LLE Hip flexion in supine, hip abduction in sidelying, and hip extension in sidelying. PT provided education for need to maintain hip extension ROM to prepare for prosthesis as  well as education for process to obtain prothesis.   [facility] too room and [facility] in reach and all needs met.           Therapy Documentation Precautions:  Precautions Precautions: Fall Restrictions Weight Bearing Restrictions: Yes LLE Weight Bearing: Non weight bearing General:   Vital Signs: Therapy Vitals Temp: 98.1 F (36.7 C) Temp Source: Oral Pulse Rate: 89 Resp: 16 BP: (!) 128/58 Patient Position (if appropriate): Lying Oxygen Therapy SpO2: 96 % O2 Device: Not Delivered Pain: Pain Assessment Pain Assessment: 0-10 Pain Score: 2  Pain Intervention(s): Medication (See eMAR) Mobility:   Locomotion :    Trunk/Postural Assessment :    Balance:   Exercises:   Other Treatments:     See Function Navigator for Current Functional Status.   Therapy/Group: Individual Therapy  AustLorie Phenix7/2018, 10:06 AM

## 2017-01-15 NOTE — Progress Notes (Signed)
[facility] PHYSICAL MEDICINE & REHABILITATION     PROGRESS NOTE    Subjective/Complaints: Had a reasonable night. Noticing some coughing/wheezing overnight. No sob  ROS: pt denies nausea, vomiting, diarrhea, shortness of breath or chest pain   Objective: Vital Signs: Blood pressure (!) 128/58, pulse 89, temperature 98.1 F (36.7 C), temperature source Oral, resp. rate 16, height 5\' 1"  (1.549 m), weight 61 kg (134 lb 7.7 oz), SpO2 96 %. No results found.  Recent Labs  01/13/17 0758 01/14/17 0514  WBC 9.3 6.9  HGB 8.2* 8.0*  HCT 24.6* 24.5*  PLT 142* 207    Recent Labs  01/13/17 0758 01/14/17 0514  NA 144 144  K 3.1* 2.9*  CL 109 107  GLUCOSE 105* 99  BUN 16 15  CREATININE 1.80* 1.70*  CALCIUM 7.7* 8.3*   CBG (last 3)  No results for input(s): GLUCAP in the last 72 hours.  Wt Readings from Last 3 Encounters:  01/15/17 61 kg (134 lb 7.7 oz)  01/13/17 56.9 kg (125 lb 7.1 oz)    Physical Exam:  HENT:  Head: Normocephalic.  Eyes: Left eye exhibits no discharge.  Neck: Normal range of motion. Neck supple. No thyromegalypresent.  Cardiovascular:RRR + murmur.  Respiratory: normal effort. No wheezing or rales.  GI: Soft. Bowel sounds are normal. She exhibits no distension.  Musculoskeletal: She exhibits edema(left AKA). Wearing ACE today Psychiatric: She has a normal mood and affect. Her behavior is normal. Thought contentnormal.  Skin. AKA wound clean, mild serosang drainage. Small ischemic areas Neurological: She is alertand oriented to person, place, and time.  Follow simple commands Motor: B/L UE are 4 to 4+/5 proximal to distal.  RLE: HF 3+/5, KE 4/5, ADP/PF 5/5 LLE: HF 2 to 3-/5 continued pain inhibition Decreased sensation in the RLE.  Assessment/Plan: 1. Functional deficits secondary to left AKA which require 3+ hours per day of interdisciplinary therapy in a comprehensive inpatient rehab setting. Physiatrist is providing close team supervision  and 24 hour management of active medical problems listed below. Physiatrist and rehab team continue to assess barriers to discharge/monitor patient progress toward functional and medical goals.  Function:  Bathing Bathing position   Position: Sitting EOB  Bathing parts Body parts bathed by patient: Right arm, Left arm, Chest, Abdomen, Front perineal area, Buttocks, Right upper leg, Left upper leg, Right lower leg Body parts bathed by helper: Back  Bathing assist Assist Level: Touching or steadying assistance(Pt > 75%)      Upper Body Dressing/Undressing Upper body dressing   What is the patient wearing?: Hospital gown                Upper body assist        Lower Body Dressing/Undressing Lower body dressing   What is the patient wearing?: Hospital Gown, Non-skid slipper socks         Non-skid slipper socks- Performed by patient: Don/doff right sock (left sock NA secondary to amputation)                    Lower body assist Assist for lower body dressing: Supervision or verbal cues      Toileting Toileting   Toileting steps completed by patient: Performs perineal hygiene Toileting steps completed by helper: Adjust clothing prior to toileting, Adjust clothing after toileting    Toileting assist     Transfers Chair/bed transfer   Chair/bed transfer method: Squat pivot Chair/bed transfer assist level: Touching or steadying assistance (Pt > 75%)  Chair/bed transfer assistive device: Armrests     Locomotion Ambulation     Max distance: 5220ft Assist level: Touching or steadying assistance (Pt > 75%)   Wheelchair   Type: Manual Max wheelchair distance: 15350ft  Assist Level: Supervision or verbal cues  Cognition Comprehension Comprehension assist level: Follows basic conversation/direction with no assist  Expression Expression assist level: Expresses basic needs/ideas: With extra time/assistive device  Social Interaction Social Interaction assist level:  Interacts appropriately 90% of the time - Needs monitoring or encouragement for participation or interaction.  Problem Solving Problem solving assist level: Solves basic 75 - 89% of the time/requires cueing 10 - 24% of the time  Memory Memory assist level: Recognizes or recalls 75 - 89% of the time/requires cueing 10 - 24% of the time   Medical Problem List and Plan: 1. Decreased functional mobilitysecondary to left AKA 01/09/2017 after failed thromboembolectomy/fasciotomy -continue CIR therapies  -she is anxious to get home 2. DVT Prophylaxis/Anticoagulation: SQ Heparin.Monitor for any bleeding episodes 3. Pain Management: Neurontin 300 mg 3 times a day, Robaxin and Percocet as needed  -continue ACE wrap for edema control/protection  -will order leg wrap/waist belt per [facility] 4. Mood: Zoloft 50 mg daily, BuSpar 15 mg every a.m. and 10 mg daily at bedtime, Xanax 0.25 mg 3 times a day as needed 5. Neuropsych: This patient iscapable of making decisions on herown behalf. 6. Skin/Wound Care: Routine skin checks -can continue with ACE for now along with DSD as long it is able to stay on limb 7. Fluids/Electrolytes/Nutrition:    .   -repleting K+  -encourage PO 8.Acute on chronic anemia/thrombocytopenia.   -hgb down to 8.2---recheck on Monday--no signs of major blood loss clinically  -platelets trending up 9.CKD stage III/rhabdo. Baseline creatinine around 1.6 follow-up renal services as needed.  -BUN/Cr decr to 16/1.8  10.Hyperlipidemia. Lipitor 11.Hypothyroidism. Synthroid 12. Cough--mucinex  -IS,OOB  LOS (Days) 2 A FACE TO FACE EVALUATION WAS PERFORMED  Ranelle OysterSWARTZ,ZACHARY T, MD 01/15/2017 10:22 AM

## 2017-01-15 NOTE — Progress Notes (Signed)
Occupational Therapy Session Note  Patient Details  Name: Alison Lynch MRN: [id number] Date of Birth: 09/06/1961  Today's Date: 01/15/2017 OT Individual Time: 1500-1530 OT Individual Time Calculation (min): 30 min    Short Term Goals: Week 1:  OT Short Term Goal 1 (Week 1): Pt will complete LB bathing sit to stand with supervision. OT Short Term Goal 2 (Week 1): Pt will complete LB dressing sit to stand with supervision. OT Short Term Goal 3 (Week 1): Pt will complete toilet transfer to the 3:1 with use of the RW with supervision. OT Short Term Goal 4 (Week 1): Pt will complete simulated walk-in shower transfer with use of the RW with supervision.   Skilled Therapeutic Interventions/Progress Updates:    Pt completed transfer to the wheelchair squat pivot with min assist.  [facility] assist using the RW.  She was able to complete toilet hygiene in sitting with supervision and then completed clothing management with min assist.  Pt continues to demonstrate decreased ability to remember hand placement with sit to stand and for stabilizing her balance while attempting to pull pants over the hips.  After washing her hands, took pt down to the laundry room and discussed laundry management from wheelchair when at home.  Pt reports that her spouse is planning on moving things around so she can access the laundry room with her wheelchair.  Min assist for sit to stand and balance when simulating putting clothing in the washing machine.  Finished session with return to the room with pt pushing wheelchair.  Pt left in chair as PT was coming for next session in 15 mins.  Call button in reach.    Therapy Documentation Precautions:  Precautions Precautions: Fall Restrictions Weight Bearing Restrictions: Yes LLE Weight Bearing: Non weight bearing  Pain: Pain Assessment Pain Assessment: Faces Pain Score: 3  Faces Pain Scale: Hurts a little bit Pain  Type: Surgical pain Pain Location: Leg Pain Orientation: Left Pain Descriptors / Indicators: Discomfort Pain Onset: With Activity Pain Intervention(s): Repositioned ADL: See Function Navigator for Current Functional Status.   Therapy/Group: Individual Therapy  Alison Lynch OTR/L 01/15/2017, 4:27 PM

## 2017-01-16 DIAGNOSIS — L270 Generalized skin eruption due to drugs and medicaments taken internally: Secondary | ICD-10-CM

## 2017-01-16 DIAGNOSIS — G8918 Other acute postprocedural pain: Secondary | ICD-10-CM

## 2017-01-16 MED ORDER — HYDROCODONE-ACETAMINOPHEN 5-325 MG PO TABS
1.0000 | ORAL_TABLET | ORAL | Status: DC | PRN
  Administered 2017-01-16 – 2017-01-17 (×4): 1 via ORAL
  Filled 2017-01-16 (×4): qty 1

## 2017-01-16 MED ORDER — PREDNISONE 5 MG PO TABS: 10.0000 mg | ORAL_TABLET | Freq: Two times a day (BID) | ORAL | Status: DC

## 2017-01-16 MED ORDER — PREDNISONE 20 MG PO TABS
20.0000 mg | ORAL_TABLET | Freq: Two times a day (BID) | ORAL | Status: AC
  Administered 2017-01-16 (×2): 20 mg via ORAL
  Filled 2017-01-16 (×2): qty 1

## 2017-01-16 NOTE — IPOC Note (Signed)
Overall Plan of Care [facility](IPOC) Patient Details Name: Alison Lynch MRN: [id number] DOB: 01/03/1961  Admitting Diagnosis: AKA  Hospital Problems: Principal Problem:   Above knee amputation of left lower extremity (HCC) Active Problems:   Benign essential HTN   Stage 3 chronic kidney disease   Cervical myelopathy (HCC)     Functional Problem List: Nursing Medication Management, Skin Integrity, Endurance, Safety, Perception, Nutrition, Pain, Sensory  PT Balance, Endurance, Pain, Safety, Sensory  OT Balance, Endurance, Pain, Safety  SLP    TR         Basic ADL's: OT Grooming, Bathing, Dressing, Toileting     Advanced  ADL's: OT Simple Meal Preparation, Light Housekeeping     Transfers: PT Bed Mobility, Bed to Chair, Car, State Street CorporationFurniture, Civil Service fast [place]erl[place]  OT Toilet, Research scientist (life sciences)Tub/Shower     Locomotion: PT Ambulation, Psychologist, prison and probation servicesWheelchair Mobility, Stairs     Additional Impairments: OT None  SLP        TR      Anticipated Outcomes Item Anticipated Outcome  Self Feeding independent  Swallowing      Basic self-care  supervision  Toileting  supervision   Bathroom Transfers supervision  Bowel/Bladder  Remain continent , no s/s infection, retention.  Transfers  Supervision assist   Locomotion  Supervision assist at [facility] level   Communication     Cognition     Pain  Managed at goal 2/10  Safety/Judgment  Increased safety awareness, no falls, injury this admission.   Therapy Plan: PT Intensity: Minimum of 1-2 x/day ,45 to 90 minutes PT Frequency: 5 out of 7 days PT Duration Estimated Length of Stay: 10-14 days  OT Intensity: Minimum of 1-2 x/day, 45 to 90 minutes OT Frequency: 5 out of 7 days OT Duration/Estimated Length of Stay: 10 days         Team Interventions: Nursing Interventions Patient/Family Education, Disease Management/Prevention, Skin Care/Wound Management, Discharge Planning, Psychosocial Support, Pain Management, Medication Management  PT interventions Ambulation/gait  training, Balance/vestibular training, Cognitive remediation/compensation, Community reintegration, Discharge planning, Disease management/prevention, DME/adaptive equipment instruction, Functional mobility training, Neuromuscular re-education, Pain management, Patient/family education, Psychosocial support, Skin care/wound management, Splinting/orthotics, Stair training, Therapeutic Activities, Therapeutic Exercise, UE/LE Strength taining/ROM, UE/LE Coordination activities, Visual/perceptual remediation/compensation, Wheelchair propulsion/positioning  OT Interventions Warden/rangerBalance/vestibular training, Discharge planning, Functional mobility training, Therapeutic Activities, UE/LE Coordination activities, Therapeutic Exercise, Self Care/advanced ADL retraining, Patient/family education, UE/LE Strength taining/ROM, DME/adaptive equipment instruction, Community reintegration, Cognitive remediation/compensation, Pain management, Wheelchair propulsion/positioning  SLP Interventions    TR Interventions    SW/CM Interventions Discharge Planning, Facilities managersychosocial Support, Patient/Family Education    Team Discharge Planning: Destination: PT-Home ,OT- Home , SLP-  Projected Follow-up: PT-Home health PT, OT-  Home health OT, SLP-  Projected Equipment Needs: PT-Rolling walker with 5" wheels, Wheelchair (measurements), Wheelchair cushion (measurements), OT- To be determined, SLP-  Equipment Details: PT- , OT-  Patient/family involved in discharge planning: PT- Patient,  OT-Patient, SLP-   MD ELOS: 10-14 days Medical Rehab Prognosis:  Excellent Assessment: The patient has been admitted for CIR therapies with the diagnosis of left AKA. The team will be addressing functional mobility, strength, stamina, balance, safety, adaptive techniques and equipment, self-care, bowel and bladder mgt, patient and caregiver education, pain mgt, pre-prosthetic education, ego support, w/c use. Goals have been set at supervision for mobility  and self-care. Struggling with rash/pain currently.    Ranelle OysterZachary T. Acacia Latorre, MD, FAAPMR      See Team Conference Notes for weekly updates to the plan of care

## 2017-01-16 NOTE — Progress Notes (Signed)
[place] PHYSICAL MEDICINE & REHABILITATION     PROGRESS NOTE    Subjective/Complaints: Diffuse rash, itching, uncomfortable. "sock" delivered which actually is shrinker---pt struggling with it as it's quite tight.   ROS: pt denies nausea, vomiting, diarrhea, cough, shortness of breath or chest pain   Objective: Vital Signs: Blood pressure 132/62, pulse 89, temperature 98 F (36.7 C), temperature source Oral, resp. rate 18, height 5\' 1"  (1.549 m), weight 61.8 kg (136 lb 3.9 oz), SpO2 90 %. No results found.  Recent Labs  01/14/17 0514  WBC 6.9  HGB 8.0*  HCT 24.5*  PLT 207    Recent Labs  01/14/17 0514  NA 144  K 2.9*  CL 107  GLUCOSE 99  BUN 15  CREATININE 1.70*  CALCIUM 8.3*   CBG (last 3)  No results for input(s): GLUCAP in the last 72 hours.  Wt Readings from Last 3 Encounters:  01/16/17 61.8 kg (136 lb 3.9 oz)  01/13/17 56.9 kg (125 lb 7.1 oz)    Physical Exam:  HENT:  Head: Normocephalic.  Eyes: Left eye exhibits no discharge.  Neck: Normal range of motion. Neck supple. No thyromegalypresent.  Cardiovascular:RRR + murmur.  Respiratory: normal effort. No wheezing or rales.  GI: Soft. Bowel sounds are normal. She exhibits no distension.  Musculoskeletal: She exhibits edema(left AKA). Wearing ACE today Psychiatric: tearful due to pain and discomfort from rash.  Skin. AKA wound clean, mild serosang drainage. Small areas of incisional necrosis persist  -shrinker on and very tight  -diffuse macular rash on trunk/all 4 limbs Neurological: She is alertand oriented to person, place, and time.  Follow simple commands Motor: B/L UE are 4 to 4+/5 proximal to distal.  RLE: HF 3+/5, KE 4/5, ADP/PF 5/5 LLE: HF 2 to 3-/5 with continued pain inhibition Decreased sensation in the RLE.  Assessment/Plan: 1. Functional deficits secondary to left AKA which require 3+ hours per day of interdisciplinary therapy in a comprehensive inpatient rehab  setting. Physiatrist is providing close team supervision and 24 hour management of active medical problems listed below. Physiatrist and rehab team continue to assess barriers to discharge/monitor patient progress toward functional and medical goals.  Function:  Bathing Bathing position   Position: Sitting EOB  Bathing parts Body parts bathed by patient: Right arm, Left arm, Chest, Abdomen, Front perineal area, Buttocks, Right upper leg, Left upper leg, Right lower leg Body parts bathed by helper: Back  Bathing assist Assist Level: Touching or steadying assistance(Pt > 75%)      Upper Body Dressing/Undressing Upper body dressing   What is the patient wearing?: Hospital gown                Upper body assist        Lower Body Dressing/Undressing Lower body dressing   What is the patient wearing?: Hospital Gown, Non-skid slipper socks         Non-skid slipper socks- Performed by patient: Don/doff right sock (left sock NA secondary to amputation)                    Lower body assist Assist for lower body dressing: Supervision or verbal cues      Toileting Toileting   Toileting steps completed by patient: Adjust clothing prior to toileting Toileting steps completed by helper: Adjust clothing prior to toileting, Adjust clothing after toileting, Performs perineal hygiene    Toileting assist Assist level: Touching or steadying assistance (Pt.75%)   Transfers Chair/bed transfer  Chair/bed transfer method: Stand pivot Chair/bed transfer assist level: Touching or steadying assistance (Pt > 75%) Chair/bed transfer assistive device: Walker, Designer, fashion/clothing     Max distance: 56ft Assist level: Touching or steadying assistance (Pt > 75%)   Wheelchair   Type: Manual Max wheelchair distance: 112ft  Assist Level: Supervision or verbal cues  Cognition Comprehension Comprehension assist level: Follows basic conversation/direction with no assist   Expression Expression assist level: Expresses basic needs/ideas: With extra time/assistive device  Social Interaction Social Interaction assist level: Interacts appropriately 90% of the time - Needs monitoring or encouragement for participation or interaction.  Problem Solving Problem solving assist level: Solves basic 75 - 89% of the time/requires cueing 10 - 24% of the time  Memory Memory assist level: Recognizes or recalls 50 - 74% of the time/requires cueing 25 - 49% of the time   Medical Problem List and Plan: 1. Decreased functional mobilitysecondary to left AKA 01/09/2017 after failed thromboembolectomy/fasciotomy -continue CIR therapies  -remains motivated 2. DVT Prophylaxis/Anticoagulation: SQ Heparin.Monitor for any bleeding episodes 3. Pain Management: Neurontin 300 mg 3 times a day, Robaxin   -changed percocet to hydrocodone due to drug rash  4. Mood: Zoloft 50 mg daily, BuSpar 15 mg every a.m. and 10 mg daily at bedtime, Xanax 0.25 mg 3 times a day as needed 5. Neuropsych: This patient iscapable of making decisions on herown behalf. 6. Skin/Wound Care: Routine skin checks - ordered a stump SOCK with belt (NOT SHRINKER which was delivered)--limb is draining and too tender for shrinker at this time. Can apply shrinker later. May use sock with ACE wrap to help keep ACE dressing in place.  7. Fluids/Electrolytes/Nutrition:    .   -repleting K+  -encourage PO 8.Acute on chronic anemia/thrombocytopenia.   -hgb down to 8.2---recheck on Monday--no signs of major blood loss clinically  -platelets trending up 9.CKD stage III/rhabdo. Baseline creatinine around 1.6 follow-up renal services as needed.  -BUN/Cr decr to 16/1.8  10.Hyperlipidemia. Lipitor 11.Hypothyroidism. Synthroid 12. Cough--mucinex  -improved. Change mucinex to prn 14. Drug rash:  -stopped cleocin and percocet  -prednisone 20mg  x 2  -benadryl prn  LOS (Days) 3 A FACE TO FACE  EVALUATION WAS PERFORMED  Faith Rogue T, MD 01/16/2017 10:00 AM

## 2017-01-16 NOTE — Progress Notes (Signed)
Stump shrinker brought up and applied. Rash all over body, patient reports worse than the day before, started 2 days ago. PO benadryl given at 2045. PRN robaxin given at 2046. Urinary frequency during night.Alison MartinezMurray, [name]

## 2017-01-17 ENCOUNTER — Inpatient Hospital Stay (HOSPITAL_COMMUNITY): Payer: Medicare HMO

## 2017-01-17 ENCOUNTER — Inpatient Hospital Stay (HOSPITAL_COMMUNITY): Payer: Medicare HMO | Admitting: Occupational Therapy

## 2017-01-17 LAB — BASIC METABOLIC PANEL
Anion gap: 11 (ref 5–15)
BUN: 14 mg/dL (ref 6–20)
CO2: 29 mmol/L (ref 22–32)
CREATININE: 1.45 mg/dL — AB (ref 0.44–1.00)
Calcium: 8.7 mg/dL — ABNORMAL LOW (ref 8.9–10.3)
Chloride: 103 mmol/L (ref 101–111)
GFR calc Af Amer: 47 mL/min — ABNORMAL LOW (ref >60)
GFR, EST NON AFRICAN AMERICAN: 40 mL/min — AB (ref >60)
GLUCOSE: 136 mg/dL — AB (ref 65–99)
Potassium: 3.5 mmol/L (ref 3.5–5.1)
SODIUM: 143 mmol/L (ref 135–145)

## 2017-01-17 MED ORDER — HYDROCODONE-ACETAMINOPHEN 5-325 MG PO TABS
1.0000 | ORAL_TABLET | ORAL | Status: DC | PRN
  Administered 2017-01-17 – 2017-01-19 (×8): 2 via ORAL
  Administered 2017-01-19: 1 via ORAL
  Administered 2017-01-19 – 2017-01-22 (×10): 2 via ORAL
  Filled 2017-01-17 (×8): qty 2
  Filled 2017-01-17 (×2): qty 1
  Filled 2017-01-17 (×10): qty 2

## 2017-01-17 MED ORDER — GABAPENTIN 400 MG PO CAPS
400.0000 mg | ORAL_CAPSULE | Freq: Three times a day (TID) | ORAL | Status: DC
  Administered 2017-01-17 – 2017-01-22 (×15): 400 mg via ORAL
  Filled 2017-01-17 (×15): qty 1

## 2017-01-17 MED ORDER — HYDROCODONE-ACETAMINOPHEN 5-325 MG PO TABS
1.0000 | ORAL_TABLET | Freq: Once | ORAL | Status: AC
  Administered 2017-01-17: 1 via ORAL

## 2017-01-17 NOTE — Progress Notes (Signed)
[place] PHYSICAL MEDICINE & REHABILITATION     PROGRESS NOTE    Subjective/Complaints:  No issue overnite, pt is crying because of phantom pain and stump pain  ROS: pt denies nausea, vomiting, diarrhea, shortness of breath or chest pain   Objective: Vital Signs: Blood pressure 138/68, pulse 87, temperature 97.8 F (36.6 C), temperature source Oral, resp. rate 16, height 5\' 1"  (1.549 m), weight 62 kg (136 lb 11 oz), SpO2 99 %. No results found. No results for input(s): WBC, HGB, HCT, PLT in the last 72 hours.  Recent Labs  01/17/17 0708  NA 143  K 3.5  CL 103  GLUCOSE 136*  BUN 14  CREATININE 1.45*  CALCIUM 8.7*   CBG (last 3)  No results for input(s): GLUCAP in the last 72 hours.  Wt Readings from Last 3 Encounters:  01/17/17 62 kg (136 lb 11 oz)  01/13/17 56.9 kg (125 lb 7.1 oz)    Physical Exam:  HENT:  Head: Normocephalic.  Eyes: Left eye exhibits no discharge.  Neck: Normal range of motion. Neck supple. No thyromegalypresent.  Cardiovascular:RRR + murmur.  Respiratory: normal effort. No wheezing or rales.  GI: Soft. Bowel sounds are normal. She exhibits no distension.  Musculoskeletal: She exhibits edema(left AKA). Wearing ACE today Psychiatric: She has a normal mood and affect. Her behavior is normal. Thought contentnormal.  Skin. AKA wound clean, mild serosang drainage. Small ischemic areas Neurological: She is alertand oriented to person, place, and time.  Follow simple commands Motor: B/L UE are 4 to 4+/5 proximal to distal.  RLE: HF 3+/5, KE 4/5, ADP/PF 5/5 LLE: HF 2 to 3-/5 continued pain inhibition Decreased sensation in the RLE.  Assessment/Plan: 1. Functional deficits secondary to left AKA which require 3+ hours per day of interdisciplinary therapy in a comprehensive inpatient rehab setting. Physiatrist is providing close team supervision and 24 hour management of active medical problems listed below. Physiatrist and rehab team  continue to assess barriers to discharge/monitor patient progress toward functional and medical goals.  Function:  Bathing Bathing position   Position: Sitting EOB  Bathing parts Body parts bathed by patient: Right arm, Left arm, Chest, Abdomen, Front perineal area, Buttocks, Right upper leg, Left upper leg, Right lower leg Body parts bathed by helper: Back  Bathing assist Assist Level: Touching or steadying assistance(Pt > 75%)      Upper Body Dressing/Undressing Upper body dressing   What is the patient wearing?: Hospital gown                Upper body assist        Lower Body Dressing/Undressing Lower body dressing   What is the patient wearing?: Hospital Gown, Non-skid slipper socks         Non-skid slipper socks- Performed by patient: Don/doff right sock (left sock NA secondary to amputation)                    Lower body assist Assist for lower body dressing: Supervision or verbal cues      Toileting Toileting   Toileting steps completed by patient: Adjust clothing prior to toileting Toileting steps completed by helper: Adjust clothing prior to toileting, Adjust clothing after toileting, Performs perineal hygiene    Toileting assist Assist level: Touching or steadying assistance (Pt.75%)   Transfers Chair/bed transfer   Chair/bed transfer method: Stand pivot Chair/bed transfer assist level: Touching or steadying assistance (Pt > 75%) Chair/bed transfer assistive device: Walker, Armrests  Locomotion Ambulation     Max distance: 5920ft Assist level: Touching or steadying assistance (Pt > 75%)   Wheelchair   Type: Manual Max wheelchair distance: 16550ft  Assist Level: Supervision or verbal cues  Cognition Comprehension Comprehension assist level: Follows basic conversation/direction with no assist  Expression Expression assist level: Expresses basic needs/ideas: With extra time/assistive device  Social Interaction Social Interaction assist  level: Interacts appropriately 90% of the time - Needs monitoring or encouragement for participation or interaction.  Problem Solving Problem solving assist level: Solves basic 75 - 89% of the time/requires cueing 10 - 24% of the time  Memory Memory assist level: Recognizes or recalls 50 - 74% of the time/requires cueing 25 - 49% of the time   Medical Problem List and Plan: 1. Decreased functional mobilitysecondary to left AKA 01/09/2017 after failed thromboembolectomy/fasciotomy -cont CIR PT,OT,  2. DVT Prophylaxis/Anticoagulation: SQ Heparin.Monitor for any bleeding episodes 3. Pain Management: Phantom pain increase Neurontin 400 mg 3 times a day, Robaxin and increase  hydrocodone as needed  -continue ACE wrap for edema control/protection 4. Mood: Zoloft 50 mg daily, BuSpar 15 mg every a.m. and 10 mg daily at bedtime, Xanax 0.25 mg 3 times a day as needed, sees [facility] as outpt- add neuropsych 5. Neuropsych: This patient iscapable of making decisions on herown behalf. 6. Skin/Wound Care: Routine skin checks -can continue with ACE for now along with DSD as long it is able to stay on limb 7. Fluids/Electrolytes/Nutrition:   -I personally reviewed the patient's labs today.   -replete K+  -encourage PO 8.Acute on chronic anemia/thrombocytopenia.   -hgb down to 8.2---recheck on Monday--no signs of major blood loss clinically  -platelets trending up 9.CKD stage III/rhabdo. Baseline creatinine around 1.6 follow-up renal services as needed.  -BUN/Cr decr to 16/1.8 today 10.Hyperlipidemia. Lipitor 11.Hypothyroidism. Synthroid 12. Cough--mucinex  -IS,OOB            13.  Rash maculopap on back cont local care, off clinda which is most likely cause LOS (Days) 4 A FACE TO FACE EVALUATION WAS PERFORMED  Erick ColaceKIRSTEINS,Bensen Chadderdon E, MD 01/17/2017 8:19 AM

## 2017-01-17 NOTE — Progress Notes (Signed)
Physical Therapy Note  Patient Details  Name: Alison Lynch MRN: [id number] Date of Birth: 04/14/1961 Today's Date: 01/17/2017  [id number], 60 min individual tx Pain: 8/10 surgical pain; premedicated  Pt tearful, relating that she had phantom pain earlier. PT provided emotional support.  Bed mobility with supervision to sit EOB and eat breakfast. PT wrapped residual limb with a 4" and a 6" ACE ( no more 6" available. ).  Pt has shrinker but cannot tolerate it at this time.  Sock with waist belt is on order.  Toilet transfer for continent  B and B.  Pt left resting bed with alarm set, and all needs within reach.  See function navigator for current status.  Avian Konigsberg 01/17/2017, 6:58 AM

## 2017-01-17 NOTE — Progress Notes (Signed)
Occupational Therapy Session Note  Patient Details  Name: Milton Fergusonammy Cheaney MRN: [id number] Date of Birth: 11/30/1960  Today's Date: 01/17/2017 OT Individual Time: [id number] and 1331-1445 OT Individual Time Calculation (min): 63 min and 74 min  Short Term Goals: Week 1:  OT Short Term Goal 1 (Week 1): Pt will complete LB bathing sit to stand with supervision. OT Short Term Goal 2 (Week 1): Pt will complete LB dressing sit to stand with supervision. OT Short Term Goal 3 (Week 1): Pt will complete toilet transfer to the 3:1 with use of the RW with supervision. OT Short Term Goal 4 (Week 1): Pt will complete simulated walk-in shower transfer with use of the RW with supervision.     Skilled Therapeutic Interventions/Progress Updates: Pt was lying in bed at time of arrival, reporting that pain level had improved since this morning and was agreeable to session. Tx focus on ADL retraining, sit<stand transitions and proper hand placement, activity tolerance, and UB strengthening. ADLs completed at EOB with overall Min A for standing balance support when completing pericare/clothing mgt with RW. RN consulted with in regards to sock for residual limb. It was not present in room, and RN reports that it has not yet arrived. Therefore ACE bandages rewrapped for residual limb without sock. Stand pivot transfer<EOB<w/c completed with Min A. Hairwashing then completed w/c level at sink for psychosocial wellness. Afterwards pt engaged in bedmaking task w/c level for bilateral UE strengthening and w/c safety/mobility during IADL tasks. Discussed adaptive techniques she can implement at home for maximizing independence with this. At end of session pt was left in w/c with all needs within reach.    2nd Session 1:1 tx (74 min) Skilled OT session completed with focus on functional w/c mobility in community environment, w/c parts mgt, functional ambulation, sit<stands and UB strengthening. Pt self propelled to gift shop with 2  rest breaks and extra time for UB strengthening. While in gift shop, pt engaged in meaningful IADL of shopping while in w/c, maneuvering down narrow aisles and around tight corners. She stood at display cases with unilateral support on surface while handling jewelry, requiring min guard and cues for hand placement. Pt hopped 40 ft with RW and Min A over carpet, able to change directions without LOB. Afterwards pt self propelled back to room in manner as written above. Stand pivot transfer completed back to bed with Min A. She was left with all needs within reach.      Therapy Documentation Precautions:  Precautions Precautions: Fall Restrictions Weight Bearing Restrictions: Yes LLE Weight Bearing: Non weight bearing   Pain: Pt reported pain to be manageable during sessions, engaged in tapping/massaging residual limb during bouts of phantom pain    ADL:      See Function Navigator for Current Functional Status.   Therapy/Group: Individual Therapy  Kayson Tasker A Malone Admire 01/17/2017, 12:33 PM

## 2017-01-17 NOTE — Progress Notes (Signed)
Orthopedic Tech Progress Note Patient Details:  Alison Lynch 05/18/1961 [id number]  Patient ID: Alison Lynch, female   DOB: 03/07/1961, 56 y.o.   MRN: [id number]   Saul FordyceJennifer C Yiannis Tulloch 01/17/2017, 9:37 [facility] left stump sock with belt.

## 2017-01-18 ENCOUNTER — Inpatient Hospital Stay (HOSPITAL_COMMUNITY): Payer: Medicare HMO | Admitting: Physical Therapy

## 2017-01-18 ENCOUNTER — Inpatient Hospital Stay (HOSPITAL_COMMUNITY): Payer: Medicare HMO | Admitting: Occupational Therapy

## 2017-01-18 NOTE — Plan of Care (Signed)
Problem: RH PAIN MANAGEMENT Goal: RH STG PAIN MANAGED AT OR BELOW PT'S PAIN GOAL < 3 on pain scale  Not progressing

## 2017-01-18 NOTE — Progress Notes (Signed)
Physical Therapy Note  Patient Details  Name: Alison Lynch MRN: [id number] Date of Birth: 06/03/1961 Today's Date: 01/18/2017    Time: 1045-1155 70 minutes  1:1 No c/o pain. Pt states she had pain meds prior to session and feels "much better" than yesterday.  Pt able to propel w/c throughout unit in home and controlled environments with supervision, occasional assist for w/c parts management and cues for safe set up for transfers.  Pt able to stand and move laundry from washer to dryer with supervision.  Supine therex in supine and sidelying for Lt LE strengthening and ROM.  Passive stretching in sidelying for Lt hip flexors with pt able to tolerate low load, long duration stretching.  Seated with 2kg medicine ball without LE support pt performed abdominal PNFs, trunk rotation and chest press all 2 x 15.  Gait with RW with close supervision 4 x 25'.  Pt states she is feeling comfortable with mobility and hopes to go home soon.  Pt given handout of abdominal exercises.   Jiyan Walkowski 01/18/2017, 11:52 AM

## 2017-01-18 NOTE — Progress Notes (Signed)
Occupational Therapy Session Note  Patient Details  Name: Alison Lynch MRN: [id number] Date of Birth: 08/07/1961  Today's Date: 01/18/2017 OT Individual Time: [id number] OT Individual Time Calculation (min): 58 min    Short Term Goals: Week 1:  OT Short Term Goal 1 (Week 1): Pt will complete LB bathing sit to stand with supervision. OT Short Term Goal 2 (Week 1): Pt will complete LB dressing sit to stand with supervision. OT Short Term Goal 3 (Week 1): Pt will complete toilet transfer to the 3:1 with use of the RW with supervision. OT Short Term Goal 4 (Week 1): Pt will complete simulated walk-in shower transfer with use of the RW with supervision.   Skilled Therapeutic Interventions/Progress Updates:    Pt rolled herself down to the ADL apartment with supervision to begin session.  Worked on walk-in shower transfers using a tub bench with min guard assist as well as the RW for stand pivot to the seat.  She was able to then use her wheelchair to maneuver around the kitchen to take items out of the refrigerator as well as cabinets.  Also had her ambulate with the RW to place items back as well with min guard assist.  Pt still needs min assist for hand placement with sit to stand but is doing a better job with this during stand to sit transitions.  Next had her roll to the UE ergonometer and complete 1 set of 5 mins on level 8 resistance.  She was able to maintain RPMs at level 20-25.  Finished session with return to the room and transfer to the bed with min guard assist squat pivot.  Pt needing min instructional cueing for correct setup of wheelchair secondary to not getting close enough to the bed and then not moving the arm rest out of the way.  Call button and phone in reach.    Therapy Documentation Precautions:  Precautions Precautions: Fall Restrictions Weight Bearing Restrictions: Yes LLE Weight Bearing: Non weight bearing  Pain: Pain 2/10 on the faces scale\  ADL: See Function  Navigator for Current Functional Status.   Therapy/Group: Individual Therapy  Francisca Harbuck OTR/L 01/18/2017, 4:36 PM

## 2017-01-18 NOTE — Progress Notes (Signed)
Occupational Therapy Session Note  Patient Details  Name: Alison Lynch MRN: [id number] Date of Birth: 03/24/1961  Today's Date: 01/18/2017 OT Individual Time: [id number] OT Individual Time Calculation (min): 56 min    Short Term Goals: Week 1:  OT Short Term Goal 1 (Week 1): Pt will complete LB bathing sit to stand with supervision. OT Short Term Goal 2 (Week 1): Pt will complete LB dressing sit to stand with supervision. OT Short Term Goal 3 (Week 1): Pt will complete toilet transfer to the 3:1 with use of the RW with supervision. OT Short Term Goal 4 (Week 1): Pt will complete simulated walk-in shower transfer with use of the RW with supervision.   Skilled Therapeutic Interventions/Progress Updates:    Pt completed bathing and dressing during session.  Supervision for supine to sit EOB, with min assist for squat pivot transfer to the wheelchair without assistive device.  She completed UB selfcare with supervision and min assist for LB selfcare sit to stand.  Pt continues to need min assist for sit for standing balance when squeezing out washcloth and for washing peri area.  Mod instructional cueing for pushing up from the wheelchair and not attempting to pull up on the sink.  Donned gowns this session in order to wash her clothing.  She was able to donn her TED stocking on the right leg as well as her gripper sock.  Had pt roll wheelchair to the laundry to place clothing in.  She was able to set the clothing in her lap while she pushed with supervision.  Min assist for sit to stand and standing balance while setting up machine.  Returned back to room at end of session with call button and phone in reach.    Therapy Documentation Precautions:  Precautions Precautions: Fall Restrictions Weight Bearing Restrictions: Yes LLE Weight Bearing: Non weight bearing  Pain: Pain Assessment Pain Assessment: 0-10 Pain Score: 7  Pain Type: Acute pain;Surgical pain Pain Location: Leg Pain  Orientation: Left Pain Onset: On-going Pain Intervention(s): Repositioned;Emotional support Multiple Pain Sites: No ADL: See Function Navigator for Current Functional Status.   Therapy/Group: Individual Therapy  Atlantis Delong OTR/L 01/18/2017, 9:59 AM

## 2017-01-18 NOTE — Progress Notes (Signed)
    Doing well today without significant complaints.  Has f/u in March for staple removal.  Call with questions in the interim.   Meeya Goldin C. Randie Heinzain, MD [facility] Office: [phone] Pager: [phone]

## 2017-01-18 NOTE — Progress Notes (Signed)
PHYSICAL MEDICINE & REHABILITATION     PROGRESS NOTE    Subjective/Complaints: Notes some R foot swelling Discussed with Dr Randie Heinzain, suspect R foot swelling due to revascularization Discussed smoking cessation No severe pain, phantom pain better   ROS: pt denies nausea, vomiting, diarrhea, shortness of breath or chest pain   Objective: Vital Signs: Blood pressure 138/78, pulse 81, temperature 98.2 F (36.8 C), temperature source Oral, resp. rate 18, height 5\' 1"  (1.549 m), weight 61.8 kg (136 lb 4.8 oz), SpO2 95 %. No results found. No results for input(s): WBC, HGB, HCT, PLT in the last 72 hours.  Recent Labs  01/17/17 0708  NA 143  K 3.5  CL 103  GLUCOSE 136*  BUN 14  CREATININE 1.45*  CALCIUM 8.7*   CBG (last 3)  No results for input(s): GLUCAP in the last 72 hours.  Wt Readings from Last 3 Encounters:  01/18/17 61.8 kg (136 lb 4.8 oz)  01/13/17 56.9 kg (125 lb 7.1 oz)    Physical Exam:  HENT:  Head: Normocephalic.  Eyes: Left eye exhibits no discharge.  Neck: Normal range of motion. Neck supple. No thyromegalypresent.  Cardiovascular:RRR + murmur.  Respiratory: normal effort. No wheezing or rales.  GI: Soft. Bowel sounds are normal. She exhibits no distension.  Musculoskeletal: She exhibits edema(left AKA). Wearing ACE today Psychiatric: She has a normal mood and affect. Her behavior is normal. Thought contentnormal.  Skin. AKA wound clean, mild serosang drainage. Small ischemic areas Neurological: She is alertand oriented to person, place, and time.  Follow simple commands Motor: B/L UE are 4 to 4+/5 proximal to distal.  RLE: HF 3+/5, KE 4/5, ADP/PF 5/5 LLE: HF 2 to 3-/5 continued pain inhibition Decreased sensation in the RLE.  Assessment/Plan: 1. Functional deficits secondary to left AKA which require 3+ hours per day of interdisciplinary therapy in a comprehensive inpatient rehab setting. Physiatrist is providing close team  supervision and 24 hour management of active medical problems listed below. Physiatrist and rehab team continue to assess barriers to discharge/monitor patient progress toward functional and medical goals.  Function:  Bathing Bathing position   Position: Sitting EOB  Bathing parts Body parts bathed by patient: Right arm, Left arm, Chest, Abdomen, Front perineal area, Buttocks, Right upper leg, Left upper leg, Right lower leg Body parts bathed by helper: Back  Bathing assist Assist Level: Touching or steadying assistance(Pt > 75%)      Upper Body Dressing/Undressing Upper body dressing   What is the patient wearing?: Button up shirt         Button up shirt - Perfomed by patient: Thread/unthread right sleeve, Thread/unthread left sleeve, Pull shirt around back, Button/unbutton shirt      Upper body assist Assist Level: Set up      Lower Body Dressing/Undressing Lower body dressing   What is the patient wearing?: Underwear, Pants, Non-skid slipper socks, Ted Hose Underwear - Performed by patient: Thread/unthread right underwear leg, Thread/unthread left underwear leg, Pull underwear up/down   Pants- Performed by patient: Thread/unthread right pants leg, Thread/unthread left pants leg, Pull pants up/down   Non-skid slipper socks- Performed by patient: Don/doff right sock                 TED Hose - Performed by helper: Don/doff right TED hose (RN request for OT to don TEDs due to R LE edema)  Lower body assist Assist for lower body dressing: Supervision or verbal cues  Toileting Toileting   Toileting steps completed by patient: Adjust clothing prior to toileting, Performs perineal hygiene, Adjust clothing after toileting Toileting steps completed by helper: Adjust clothing prior to toileting, Adjust clothing after toileting, Performs perineal hygiene Toileting Assistive Devices: Grab bar or rail  Toileting assist Assist level: Touching or steadying assistance  (Pt.75%)   Transfers Chair/bed transfer   Chair/bed transfer method: Stand pivot Chair/bed transfer assist level: Touching or steadying assistance (Pt > 75%) Chair/bed transfer assistive device: Walker, Designer, fashion/clothing     Max distance: 63ft Assist level: Touching or steadying assistance (Pt > 75%)   Wheelchair   Type: Manual Max wheelchair distance: 196ft  Assist Level: Supervision or verbal cues  Cognition Comprehension Comprehension assist level: Follows basic conversation/direction with no assist  Expression Expression assist level: Expresses basic needs/ideas: With extra time/assistive device  Social Interaction Social Interaction assist level: Interacts appropriately 90% of the time - Needs monitoring or encouragement for participation or interaction.  Problem Solving Problem solving assist level: Solves basic 75 - 89% of the time/requires cueing 10 - 24% of the time  Memory Memory assist level: Recognizes or recalls 50 - 74% of the time/requires cueing 25 - 49% of the time   Medical Problem List and Plan: 1. Decreased functional mobilitysecondary to left AKA 01/09/2017 after failed thromboembolectomy/fasciotomy -cont CIR PT,OT, team conf in am 2. DVT Prophylaxis/Anticoagulation: SQ Heparin.Monitor for any bleeding episodes 3. Pain Management: Phantom pain increase Neurontin 400 mg 3 times a day, Robaxin and increase  hydrocodone as needed  -continue ACE wrap for edema control/protection 4. Mood: Zoloft 50 mg daily, BuSpar 15 mg every a.m. and 10 mg daily at bedtime, Xanax 0.25 mg 3 times a day as needed, sees [facility] as outpt- Consult neuropsych 5. Neuropsych: This patient iscapable of making decisions on herown behalf. 6. Skin/Wound Care: Routine skin checks -can continue with ACE for now along with DSD as long it is able to stay on limb 7. Fluids/Electrolytes/Nutrition:   -  -hypoK resolved  -encourage PO 8.Acute on  chronic anemia/thrombocytopenia.   -hgb down to 8.2---recheck on Monday--no signs of major blood loss clinically  -platelets trending up 9.CKD stage III/rhabdo. Baseline creatinine around 1.6 follow-up renal services as needed.  -BUN/Cr decr to 16/1.8 today 10.Hyperlipidemia. Lipitor 11.Hypothyroidism. Synthroid 12. Cough--mucinex  -IS,OOB            13.  Rash maculopap on back cont local care, off clinda which is most likely cause LOS (Days) 5 A FACE TO FACE EVALUATION WAS PERFORMED  Erick Colace, MD 01/18/2017 8:22 AM

## 2017-01-18 NOTE — Progress Notes (Signed)
   01/18/17 1200  Clinical Encounter Type  Visited With Patient  Visit Type Initial;Psychological support;Spiritual support  Referral From Nurse  Consult/Referral To Chaplain  Recommendations (embodied prayer)  Spiritual Encounters  Spiritual Needs Prayer;Emotional  Stress Factors  Patient Stress Factors Family relationships;Health changes;Loss    Chaplain responded to request for chaplain. Patient had concerns post amputation, had phantom leg pains last night and woke up screaming. Chaplain did embodied prayer with patient and patient said that helped and would practice at home. Also suggest support groups provided ministry of support and presence.

## 2017-01-19 ENCOUNTER — Inpatient Hospital Stay (HOSPITAL_COMMUNITY): Payer: Medicare HMO | Admitting: Physical Therapy

## 2017-01-19 ENCOUNTER — Inpatient Hospital Stay (HOSPITAL_COMMUNITY): Payer: Medicare HMO | Admitting: Occupational Therapy

## 2017-01-19 ENCOUNTER — Inpatient Hospital Stay (HOSPITAL_COMMUNITY): Payer: Medicare HMO

## 2017-01-19 MED ORDER — NICOTINE 7 MG/24HR TD PT24
7.0000 mg | MEDICATED_PATCH | Freq: Every day | TRANSDERMAL | Status: DC
  Administered 2017-01-19 – 2017-01-22 (×4): 7 mg via TRANSDERMAL
  Filled 2017-01-19 (×4): qty 1

## 2017-01-19 NOTE — Progress Notes (Signed)
Occupational Therapy Session Note  Patient Details  Name: Alison Lynch MRN: [id number] Date of Birth: 07/12/1961  Today's Date: 01/19/2017 OT Individual Time: 7829-56210916-1030 OT Individual Time Calculation (min): 74 min    Short Term Goals: Week 1:  OT Short Term Goal 1 (Week 1): Pt will complete LB bathing sit to stand with supervision. OT Short Term Goal 2 (Week 1): Pt will complete LB dressing sit to stand with supervision. OT Short Term Goal 3 (Week 1): Pt will complete toilet transfer to the 3:1 with use of the RW with supervision. OT Short Term Goal 4 (Week 1): Pt will complete simulated walk-in shower transfer with use of the RW with supervision.   Skilled Therapeutic Interventions/Progress Updates:    Pt completed transfer from the EOB to the 3:1 over the toilet with close supervision using the RW for support.  She was able to complete all toilet hygiene with supervision as well sit to stand.  She completed washing her hands and brushing her teeth in standing as well with min guard assist.  Completed transfer back to the bed for removal of LB clothing and therapist applying new bandages and ace wrapping to the left residual limb.  She was able to apply LB clothing in long sitting and transfer to the EOB to pull over hips with lateral leans.  Next had pt roll herself down to the therapy gym with modified independence.  She utilized the Ergonometer for 2 sets of 5 mins.  Resistance set on level 10 with pt maintaining RPMs between 22-25 for both sets.  She completed one set peddling forward and then one in reverse.  Finished session with pt returning to the room via wheelchair.  Pt transferred back to the bed with min guard squat pivot to conclude session.  Mod instructional cueing needed for setup of wheelchair and technique to complete transfer.    Therapy Documentation Precautions:  Precautions Precautions: Fall Restrictions Weight Bearing Restrictions: No LLE Weight Bearing: Non weight  bearing  Pain:  Pain 2/10 in the LLE  ADL: See Function Navigator for Current Functional Status.   Therapy/Group: Individual Therapy  Toretto Tingler OTR/L 01/19/2017, 11:00 AM

## 2017-01-19 NOTE — Progress Notes (Signed)
[place] PHYSICAL MEDICINE & REHABILITATION     PROGRESS NOTE    Subjective/Complaints: Working with occupational therapy this morning Discussed smoking cessation, patient would like to try NicoDerm patch    ROS: pt denies nausea, vomiting, diarrhea, shortness of breath or chest pain   Objective: Vital Signs: Blood pressure 134/81, pulse 78, temperature 98.6 F (37 C), temperature source Oral, resp. rate 16, height '5\' 1"'  (1.549 m), weight 62 kg (136 lb 11 oz), SpO2 99 %. No results found. No results for input(s): WBC, HGB, HCT, PLT in the last 72 hours.  Recent Labs  01/17/17 0708  NA 143  K 3.5  CL 103  GLUCOSE 136*  BUN 14  CREATININE 1.45*  CALCIUM 8.7*   CBG (last 3)  No results for input(s): GLUCAP in the last 72 hours.  Wt Readings from Last 3 Encounters:  01/19/17 62 kg (136 lb 11 oz)  01/13/17 56.9 kg (125 lb 7.1 oz)    Physical Exam:  HENT:  Head: Normocephalic.  Eyes: Left eye exhibits no discharge.  Neck: Normal range of motion. Neck supple. No thyromegalypresent.  Cardiovascular:RRR + murmur.  Respiratory: normal effort. No wheezing or rales.  GI: Soft. Bowel sounds are normal. She exhibits no distension.  Musculoskeletal: She exhibits edema(left AKA). Wearing ACE today Psychiatric: She has a normal mood and affect. Her behavior is normal. Thought contentnormal.  Skin. AKA wound clean, mild serosang drainage. Small ischemic areas Neurological: She is alertand oriented to person, place, and time.  Follow simple commands Motor: B/L UE are 4 to 4+/5 proximal to distal.  RLE: HF 3+/5, KE 4/5, ADP/PF 5/5 LLE: HF 4-/5  Decreased sensation in the RLE.  Assessment/Plan: 1. Functional deficits secondary to left AKA which require 3+ hours per day of interdisciplinary therapy in a comprehensive inpatient rehab setting. Physiatrist is providing close team supervision and 24 hour management of active medical problems listed below. Physiatrist and  rehab team continue to assess barriers to discharge/monitor patient progress toward functional and medical goals.  Function:  Bathing Bathing position   Position: Wheelchair/chair at sink  Bathing parts Body parts bathed by patient: Right arm, Left arm, Chest, Abdomen, Front perineal area, Buttocks, Right upper leg, Left upper leg, Right lower leg Body parts bathed by helper: Back  Bathing assist Assist Level: Touching or steadying assistance(Pt > 75%)      Upper Body Dressing/Undressing Upper body dressing   What is the patient wearing?: Pull over shirt/dress     Pull over shirt/dress - Perfomed by patient: Thread/unthread right sleeve, Thread/unthread left sleeve, Put head through opening, Pull shirt over trunk   Button up shirt - Perfomed by patient: Thread/unthread right sleeve, Thread/unthread left sleeve, Pull shirt around back, Button/unbutton shirt      Upper body assist Assist Level: Set up      Lower Body Dressing/Undressing Lower body dressing   What is the patient wearing?: Pants, Ted Hose, Non-skid slipper socks Underwear - Performed by patient: Thread/unthread right underwear leg, Thread/unthread left underwear leg, Pull underwear up/down   Pants- Performed by patient: Thread/unthread right pants leg, Thread/unthread left pants leg, Pull pants up/down   Non-skid slipper socks- Performed by patient: Don/doff right sock               TED Hose - Performed by patient: Don/doff right TED hose TED Hose - Performed by helper: Don/doff right TED hose (RN request for OT to don TEDs due to R LE edema)  Lower body  assist Assist for lower body dressing: Touching or steadying assistance (Pt > 75%)      Toileting Toileting   Toileting steps completed by patient: Adjust clothing prior to toileting, Performs perineal hygiene, Adjust clothing after toileting Toileting steps completed by helper: Adjust clothing prior to toileting, Adjust clothing after toileting, Performs  perineal hygiene Toileting Assistive Devices: Grab bar or rail  Toileting assist Assist level: Supervision or verbal cues   Transfers Chair/bed transfer   Chair/bed transfer method: Squat pivot Chair/bed transfer assist level: Supervision or verbal cues Chair/bed transfer assistive device: Armrests     Locomotion Ambulation     Max distance: 88f Assist level: Supervision or verbal cues   Wheelchair   Type: Manual Max wheelchair distance: 2048f Assist Level: Supervision or verbal cues  Cognition Comprehension Comprehension assist level: Follows basic conversation/direction with no assist  Expression Expression assist level: Expresses basic needs/ideas: With no assist  Social Interaction Social Interaction assist level: Interacts appropriately with others with medication or extra time (anti-anxiety, antidepressant).  Problem Solving Problem solving assist level: Solves complex 90% of the time/cues < 10% of the time  Memory Memory assist level: Recognizes or recalls 90% of the time/requires cueing < 10% of the time   Medical Problem List and Plan: 1. Decreased functional mobilitysecondary to left AKA 01/09/2017 after failed thromboembolectomy/fasciotomy -cont CIR PT,OT, Team conference today please see physician documentation under team conference tab, met with team face-to-face to discuss problems,progress, and goals. Formulized individual treatment plan based on medical history, underlying problem and comorbidities. 2. DVT Prophylaxis/Anticoagulation: SQ Heparin.Monitor for any bleeding episodes 3. Pain Management: Phantom pain increase Neurontin 400 mg 3 times a day, Robaxin and increase  hydrocodone as needed  -continue ACE wrap for edema control/protection 4. Mood: Zoloft 50 mg daily, BuSpar 15 mg every a.m. and 10 mg daily at bedtime, Xanax 0.25 mg 3 times a day as needed, sees [facility] as outpt- neuropsych pnd 5. Neuropsych: This patient iscapable of making  decisions on herown behalf. 6. Skin/Wound Care: Routine skin checks -can continue with ACE for now along with DSD as long it is able to stay on limb 7. Fluids/Electrolytes/Nutrition:   -  -hypoK resolved  -encourage PO 8.Acute on chronic anemia/thrombocytopenia.   -hgb down to 8.2---recheck on Monday--no signs of major blood loss clinically  -platelets trending up 9.CKD stage III/rhabdo. Baseline creatinine around 1.6 follow-up renal services as needed.  -BUN/Cr decr to 16/1.8 today 10.Hyperlipidemia. Lipitor 11.Hypothyroidism. Synthroid 12. Cough--mucinex, resolved  -IS,OOB            13.  Rash maculopap due to clinda, resolved LOS (Days) 6 A FACE TO FACE EVALUATION WAS PERFORMED  KICharlett BlakeMD 01/19/2017 12:14 PM

## 2017-01-19 NOTE — Progress Notes (Addendum)
Physical Therapy Note  Patient Details  Name: Alison Lynch MRN: [id number] Date of Birth: 07/09/1961 Today's Date: 01/19/2017  1333-1430, 57 min individual tx Pain: " a little bit" L residual limb; premedicated  Husband Chanetta MarshallJimmy here for family ed.  He return- demonstrated basic and car transfers, guarding for gait, bumping w/c up/down threshold to enter house, donning gray shrinker sock with attached waist strap, over ACE wrap on residual limb.    Time constraints prevented education regarding ACE wrapping residual limb; he will need to practice this on or before d/c. Chanetta MarshallJimmy does not think he will be coming back before d/c.  He verbalized understanding of desensitization techniques.  See navigator function for current status  Syrina Wake 01/19/2017, 4:19 PM

## 2017-01-19 NOTE — Progress Notes (Signed)
Physical Therapy Session Note  Patient Details  Name: Alison Lynch MRN: [id number] Date of Birth: 09-17-1961  Today's Date: 01/19/2017 PT Individual Time: 0801-0859 PT Individual Time Calculation (min): 58 min   Short Term Goals: Week 1:  PT Short Term Goal 1 (Week 1): Patient will perform transfers with min assist  PT Short Term Goal 2 (Week 1): Patient will perform WC mobility with supervision assist.  PT Short Term Goal 3 (Week 1): Gait with RW x 48f with min assist.   Skilled Therapeutic Interventions/Progress Updates:      Pt received supine in bed and agreeable to PT. Supine>sit transfer without assist or cues from PT.   Sitting EOB to don pants with supervision assist with cues for lateral lean to pull pants to waist.   [facility] treatment. With min cues for set up.   WC mobility in hall 2x 2067fOn various surfaces including tile and carpeted floor as well as additional 70 ft in rehab gym. Supervision assist for turning R and L around obstacles.   Gait training for 5066f 2 with RW. PT provided Supervision assist with min cues for   Standing and sitting therex including LLE hip abduction and extension x 10 each with BUE support on RW as well as press ups from WC arm rests x 10.   Patient returned to room and [facility] and all needs met.      Therapy Documentation Precautions:  Precautions Precautions: Fall Restrictions Weight Bearing Restrictions: No LLE Weight Bearing: Non weight bearing Vital Signs: Therapy Vitals Temp: 98.6 F (37 C) Temp Source: Oral Pulse Rate: 78 Resp: 16 BP: 134/81 Patient Position (if appropriate): Lying Oxygen Therapy SpO2: 99 % O2 Device: Not Delivered Pain: 0/10 at rest.   See Function Navigator for Current Functional Status.   Therapy/Group: Individual Therapy  AusLorie Phenix21/2018, 9:02 AM

## 2017-01-19 NOTE — Progress Notes (Signed)
Social Work Patient ID: Alison Lynch, female   DOB: Aug 22, 1961, 56 y.o.   MRN: [id number]  Met with pt and husband and other friends to discuss team conference goals-supervision level and target discharge date 2/24. All very pleased with her progress and plan to go home. She feels comfortable and husband can provide supervision level. Will work on discharge needs and agreeable to wheelchair and follow up home health.

## 2017-01-20 ENCOUNTER — Inpatient Hospital Stay (HOSPITAL_COMMUNITY): Payer: Medicare HMO

## 2017-01-20 ENCOUNTER — Inpatient Hospital Stay (HOSPITAL_COMMUNITY): Payer: Medicare HMO | Admitting: Occupational Therapy

## 2017-01-20 NOTE — Progress Notes (Signed)
[place] PHYSICAL MEDICINE & REHABILITATION     PROGRESS NOTE    Subjective/Complaints: Discussed diet, as well as supplements    ROS: pt denies nausea, vomiting, diarrhea, shortness of breath or chest pain   Objective: Vital Signs: Blood pressure 115/64, pulse 80, temperature 98.4 F (36.9 C), temperature source Oral, resp. rate 18, height 5\' 1"  (1.549 m), weight 48.5 kg (107 lb), SpO2 93 %. No results found. No results for input(s): WBC, HGB, HCT, PLT in the last 72 hours. No results for input(s): NA, K, CL, GLUCOSE, BUN, CREATININE, CALCIUM in the last 72 hours.  Invalid input(s): CO CBG (last 3)  No results for input(s): GLUCAP in the last 72 hours.  Wt Readings from Last 3 Encounters:  01/20/17 48.5 kg (107 lb)  01/13/17 56.9 kg (125 lb 7.1 oz)    Physical Exam:  HENT:  Head: Normocephalic.  Eyes: Left eye exhibits no discharge.  Neck: Normal range of motion. Neck supple. No thyromegalypresent.  Cardiovascular:RRR + murmur.  Respiratory: normal effort. No wheezing or rales.  GI: Soft. Bowel sounds are normal. She exhibits no distension.  Musculoskeletal: She exhibits edema(left AKA). Wearing ACE today Psychiatric: She has a normal mood and affect. Her behavior is normal. Thought contentnormal.  Skin. AKA wound clean, mild serosang drainage. Small ischemic areas Neurological: She is alertand oriented to person, place, and time.  Follow simple commands Motor: B/L UE are 4 to 4+/5 proximal to distal.  RLE: HF 3+/5, KE 4/5, ADP/PF 5/5 LLE: HF 4-/5  Decreased sensation in the RLE.  Assessment/Plan: 1. Functional deficits secondary to left AKA which require 3+ hours per day of interdisciplinary therapy in a comprehensive inpatient rehab setting. Physiatrist is providing close team supervision and 24 hour management of active medical problems listed below. Physiatrist and rehab team continue to assess barriers to discharge/monitor patient progress toward  functional and medical goals.  Function:  Bathing Bathing position   Position: Wheelchair/chair at sink  Bathing parts Body parts bathed by patient: Right arm, Left arm, Chest, Abdomen, Front perineal area, Buttocks, Right upper leg, Left upper leg, Right lower leg Body parts bathed by helper: Back  Bathing assist Assist Level: Touching or steadying assistance(Pt > 75%)      Upper Body Dressing/Undressing Upper body dressing   What is the patient wearing?: Pull over shirt/dress     Pull over shirt/dress - Perfomed by patient: Thread/unthread right sleeve, Thread/unthread left sleeve, Put head through opening, Pull shirt over trunk   Button up shirt - Perfomed by patient: Thread/unthread right sleeve, Thread/unthread left sleeve, Pull shirt around back, Button/unbutton shirt      Upper body assist Assist Level: Set up      Lower Body Dressing/Undressing Lower body dressing   What is the patient wearing?: Pants, Ted Hose, Non-skid slipper socks Underwear - Performed by patient: Thread/unthread right underwear leg, Thread/unthread left underwear leg, Pull underwear up/down   Pants- Performed by patient: Thread/unthread right pants leg, Thread/unthread left pants leg, Pull pants up/down   Non-skid slipper socks- Performed by patient: Don/doff right sock               TED Hose - Performed by patient: Don/doff right TED hose TED Hose - Performed by helper: Don/doff right TED hose (RN request for OT to don TEDs due to R LE edema)  Lower body assist Assist for lower body dressing: Touching or steadying assistance (Pt > 75%)      LexicographerToileting Toileting   Toileting  steps completed by patient: Adjust clothing prior to toileting, Performs perineal hygiene, Adjust clothing after toileting Toileting steps completed by helper: Adjust clothing prior to toileting, Adjust clothing after toileting, Performs perineal hygiene Toileting Assistive Devices: Grab bar or rail  Toileting assist  Assist level: Supervision or verbal cues   Transfers Chair/bed transfer   Chair/bed transfer method: Squat pivot, Stand pivot Chair/bed transfer assist level: Supervision or verbal cues Chair/bed transfer assistive device: Patent attorney     Max distance: 50 Assist level: Supervision or verbal cues   Wheelchair   Type: Manual Max wheelchair distance: 150 Assist Level: Supervision or verbal cues  Cognition Comprehension Comprehension assist level: Follows basic conversation/direction with extra time/assistive device  Expression Expression assist level: Expresses basic needs/ideas: With no assist  Social Interaction Social Interaction assist level: Interacts appropriately 90% of the time - Needs monitoring or encouragement for participation or interaction.  Problem Solving Problem solving assist level: Solves basic 90% of the time/requires cueing < 10% of the time  Memory Memory assist level: Requires cues to use assistive device, Recognizes or recalls 50 - 74% of the time/requires cueing 25 - 49% of the time   Medical Problem List and Plan: 1. Decreased functional mobilitysecondary to left AKA 01/09/2017 after failed thromboembolectomy/fasciotomy -cont CIR PT,OT, pt progressing well 2. DVT Prophylaxis/Anticoagulation: SQ Heparin.Monitor for any bleeding episodes 3. Pain Management: Phantom pain increase Neurontin 400 mg 3 times a day, Robaxin and increase  hydrocodone as needed  -continue ACE wrap for edema control/protection 4. Mood: Zoloft 50 mg daily, BuSpar 15 mg every a.m. and 10 mg daily at bedtime, Xanax 0.25 mg 3 times a day as needed, sees [facility] as outpt- pt worried about losing RLE, per [facility], no severe vasc disease on R 5. Neuropsych: This patient iscapable of making decisions on herown behalf. 6. Skin/Wound Care: Routine skin checks -AK stump shrinker in place of ACE 7. Fluids/Electrolytes/Nutrition:   -  -hypoK  resolved  -encourage PO 8.Acute on chronic anemia/thrombocytopenia.   -hgb down to 8.2---recheck on Monday--no signs of major blood loss clinically  -platelets trending up 9.CKD stage III/rhabdo. Baseline creatinine around 1.6 follow-up renal services as needed.  -BUN/Cr decr to 16/1.8 today 10.Hyperlipidemia. Lipitor 11.Hypothyroidism. Synthroid            LOS (Days) 7 A FACE TO FACE EVALUATION WAS PERFORMED  Erick Colace, MD 01/20/2017 8:35 AM

## 2017-01-20 NOTE — Progress Notes (Signed)
Social Work Patient ID: Alison Fergusonammy Gervase, female   DOB: 05/27/1961, 56 y.o.   MRN: [id number] Made pt aware the preferred Home Health agency-[facility] can not take her case due to too busy with the ones they have. She voiced no preference now, will look for a home health agency to provide the services she needs.

## 2017-01-20 NOTE — Progress Notes (Signed)
Occupational Therapy Session Note  Patient Details  Name: Alison Lynch MRN: [id number] Date of Birth: 05/01/1961  Today's Date: 01/20/2017 OT Individual Time: 1050-1200 OT Individual Time Calculation (min): 70 min    Short Term Goals: Week 1:  OT Short Term Goal 1 (Week 1): Pt will complete LB bathing sit to stand with supervision. OT Short Term Goal 2 (Week 1): Pt will complete LB dressing sit to stand with supervision. OT Short Term Goal 3 (Week 1): Pt will complete toilet transfer to the 3:1 with use of the RW with supervision. OT Short Term Goal 4 (Week 1): Pt will complete simulated walk-in shower transfer with use of the RW with supervision.   Skilled Therapeutic Interventions/Progress Updates:    Pt completed bathing and dressing during session.  Supervision for transfer to the tub bench with use of the RW and supervision.  She completed all bathing in sitting with lateral leans for washing her peri area.  She was able to complete dressing sit to stand at the sink as well but needed max assist for re-wrapping the left residual limb and donning sock.  Once finished she completed grooming tasks from wheelchair level at the sink before finishing session with wheelchair mobility to and from the ADL apartment with supervision.  Pt left in wheelchair at end of session with call button and phone in reach.    Therapy Documentation Precautions:  Precautions Precautions: Fall Restrictions Weight Bearing Restrictions: Yes LLE Weight Bearing: Non weight bearing  Pain: Pain Assessment Pain Assessment: Faces Pain Score: 7  Faces Pain Scale: Hurts a little bit Pain Type: Acute pain Pain Location: Leg Pain Orientation: Left Pain Descriptors / Indicators: Discomfort Pain Frequency: Intermittent Pain Onset: With Activity Patients Stated Pain Goal: 3 Pain Intervention(s): Repositioned;Emotional support ADL: See Function Navigator for Current Functional Status.   Therapy/Group:  Individual Therapy  Lorinda Copland OTR/L 01/20/2017, 12:29 PM

## 2017-01-20 NOTE — Progress Notes (Signed)
Physical Therapy Note  Patient Details  Name: Alison Lynch MRN: [id number] Date of Birth: 05/02/1961 Today's Date: 01/20/2017  tx 1:  0915-1015, 60 min individual tx Pain: 7/10 Phantom and surgical pain, premedicated  Bed mobility while PT wrapped residual limb with ACE wraps and donned sock with waist strap.  Gait in room with RW to toilet and sink.  Toilet transfer with supervision.  Therapeutic exercise performed with LE to increase strength for functional mobility; seated: 10 x 1 bil hip adduction; standing R hip extension with tactile cues.  Gait with RW x 30' iwht multiple turns, supervision; distance limited by pain.. Pt left resting in w/c with all needs within reach.  Tx2:  1405-1515, 70 min individual tx Pain: 7/10 phantom and surgical pain, premedicated  PT instructed pt in HEP of glut sets, L hip extension with towel roll under residaul limb, R sidelying hip ext, x 10 each, and prone lying for hip flexor stretch x 5 min. Hand out provided; to be performed 2 x /day. Hand outs also provided for desensitization, limb wrapping, donning shrinker sock with waist strap.  Pt pain- free in prone lying.  Squat pivot bed> w/c (personal 16 x 16) with supervision.  Footrest adjusted on new w/c for fit and function.  Pt able to manipulate R foot rest with minimal cues.  Pt left resting in bed with all needs within reach.   See function navigator for current status.  Suraiya Dickerson 01/20/2017, 7:54 AM

## 2017-01-20 NOTE — Patient Care Conference (Signed)
Inpatient RehabilitationTeam Conference and Plan of Care Update Date: 01/19/2017   Time: 10:30 AM    Patient Name: Alison Lynch      Medical Record Number: [id number]  Date of Birth: 05/17/1961 Sex: Female         Room/Bed: 4W26C/4W26C-01 Payor Info: Payor: [employer] MEDICARE / Plan: Monia PouchAETNA MEDICARE HMO/PPO / Product Type: *No Product type* /    Admitting Diagnosis: AKA  Admit Date/Time:  01/13/2017  4:29 PM Admission Comments: No comment available   Primary Diagnosis:  Above knee amputation of left lower extremity (HCC) Principal Problem: Above knee amputation of left lower extremity [facility](HCC)  Patient Active Problem List   Diagnosis Date Noted  . Above knee amputation of left lower extremity (HCC) 01/13/2017  . Benign essential HTN   . PTSD (post-traumatic stress disorder)   . AKI (acute kidney injury) (HCC)   . Stage 3 chronic kidney disease   . Post-operative pain   . Cervical myelopathy (HCC)   . Chronic pain syndrome   . Leukocytosis   . Thrombocytopenia (HCC)   . Ischemia of lower extremity 01/08/2017  . Ischemia of left lower extremity 01/08/2017    Expected Discharge Date: Expected Discharge Date: 01/22/17  Team Members Present: Physician leading conference: Dr. Claudette LawsAndrew Kirsteins Social Worker Present: Dossie DerBecky Raylen Ken, LCSW Nurse Present: Carmie EndAngie Joyce, RN PT Present: Wanda Plumparoline Cook, Antonietta JewelPT;Austin Tucker, PT OT Present: Perrin MalteseJames McGuire, OT SLP Present: Other (comment) (Happi Overton-SP) PPS Coordinator present : Tora DuckMarie Noel, RN, CRRN     Current Status/Progress Goal Weekly Team Focus  Medical   hx anxiety, pain control improving  home with family, pain controlled with oral meds  d/c planning   Bowel/Bladder   Continent of bowel and bladder. LBM 01/17/17  Pt to remain continent of bowel and bladder  Monitor   Swallow/Nutrition/ Hydration             ADL's   Pt currently supervision to min guard assist for functional transfers with and without the RW.  Min guard for toileting as  well as dressing sit to stand and bathing.  supervision overall  selfcare retraining, balance retraining, therapeutic exercise, therapeutic activity, pt/family education   Mobility   Supervision assist with transfers, WC mobility, and Gait for household distances with RW  Supervision assist with RW for gait in house and WC level for community mobility.    balance, strengthening UE, Increased independence with gait and WC.    Communication             Safety/Cognition/ Behavioral Observations            Pain   Vicodin x 2 q 4hrs prn  <3  Monitor for effectiveness   Skin   Surgical incision with ace wrap to area, cdi  No additonal skin breakdown  Assess q shift      *See Care Plan and progress notes for long and short-term goals.  Barriers to Discharge: anxiety and pain    Possible Resolutions to Barriers:  see above    Discharge Planning/Teaching Needs:  Home with disabled husband, who can provide assist. Pt is hoping to go home soon, hard for husband and family to come up her due to no reliable car.      Team Discussion:  Goals supervision level doing well in therapies. Better pain control and able to participate more when pain managed. Wrong stump shrinker delivered to be corrected today. Husband here for education working toward discharge Sat.   Revisions to Treatment  Plan:  DC 2/24   Continued Need for Acute Rehabilitation Level of Care: The patient requires daily medical management by a physician with specialized training in physical medicine and rehabilitation for the following conditions: Daily direction of a multidisciplinary physical rehabilitation program to ensure safe treatment while eliciting the highest outcome that is of practical value to the patient.: Yes Daily medical management of patient stability for increased activity during participation in an intensive rehabilitation regime.: Yes Daily analysis of laboratory values and/or radiology reports with any  subsequent need for medication adjustment of medical intervention for : Post surgical problems  Jerrard Bradburn, Lemar Livings 01/20/2017, 11:56 AM

## 2017-01-21 ENCOUNTER — Inpatient Hospital Stay (HOSPITAL_COMMUNITY): Payer: Medicare HMO | Admitting: Physical Therapy

## 2017-01-21 ENCOUNTER — Inpatient Hospital Stay (HOSPITAL_COMMUNITY): Payer: Medicare HMO | Admitting: Occupational Therapy

## 2017-01-21 MED ORDER — GABAPENTIN 400 MG PO CAPS: 400.0000 mg | ORAL_CAPSULE | Freq: Three times a day (TID) | ORAL | 1 refills | Status: DC

## 2017-01-21 MED ORDER — HYDROCODONE-ACETAMINOPHEN 5-325 MG PO TABS: 1.0000 | ORAL_TABLET | ORAL | 0 refills | Status: DC | PRN

## 2017-01-21 MED ORDER — LEVOTHYROXINE SODIUM 50 MCG PO TABS: 50.0000 ug | ORAL_TABLET | Freq: Every day | ORAL | 1 refills | Status: DC

## 2017-01-21 MED ORDER — METHOCARBAMOL 750 MG PO TABS: 750.0000 mg | ORAL_TABLET | Freq: Three times a day (TID) | ORAL | 0 refills | Status: DC | PRN

## 2017-01-21 MED ORDER — GUAIFENESIN ER 600 MG PO TB12: 600.0000 mg | ORAL_TABLET | Freq: Two times a day (BID) | ORAL | 0 refills | Status: DC

## 2017-01-21 MED ORDER — SERTRALINE HCL 50 MG PO TABS: 50.0000 mg | ORAL_TABLET | Freq: Every day | ORAL | 0 refills | Status: DC

## 2017-01-21 MED ORDER — ATORVASTATIN CALCIUM 80 MG PO TABS: 80.0000 mg | ORAL_TABLET | Freq: Every day | ORAL | 0 refills | Status: DC

## 2017-01-21 MED ORDER — CLOPIDOGREL BISULFATE 75 MG PO TABS: 75.0000 mg | ORAL_TABLET | Freq: Every day | ORAL | 11 refills | Status: DC

## 2017-01-21 MED ORDER — NICOTINE 7 MG/24HR TD PT24: 7.0000 mg | MEDICATED_PATCH | Freq: Every day | TRANSDERMAL | 0 refills | Status: DC

## 2017-01-21 MED ORDER — ALPRAZOLAM 0.25 MG PO TABS: 0.2500 mg | ORAL_TABLET | Freq: Three times a day (TID) | ORAL | 0 refills | Status: DC | PRN

## 2017-01-21 MED ORDER — PANTOPRAZOLE SODIUM 40 MG PO TBEC: 40.0000 mg | DELAYED_RELEASE_TABLET | Freq: Every day | ORAL | 0 refills | Status: DC

## 2017-01-21 NOTE — Progress Notes (Signed)
  Progress Note    01/21/2017 9:08 AM * No surgery found *  Subjective:  No complaints this a.m.  Vitals:   01/20/17 1500 01/21/17 0700  BP: 120/60 (!) 102/57  Pulse: 78 84  Resp: 19 20  Temp: 98.7 F (37.1 C) 98.1 F (36.7 C)    Physical Exam: aaox3 Left aka is dressed at time of exam Right foot is warm  CBC    Component Value Date/Time   WBC 6.9 01/14/2017 0514   RBC 2.66 (L) 01/14/2017 0514   HGB 8.0 (L) 01/14/2017 0514   HCT 24.5 (L) 01/14/2017 0514   PLT 207 01/14/2017 0514   MCV 92.1 01/14/2017 0514   MCH 30.1 01/14/2017 0514   MCHC 32.7 01/14/2017 0514   RDW 14.2 01/14/2017 0514   LYMPHSABS 1.6 01/14/2017 0514   MONOABS 0.9 01/14/2017 0514   EOSABS 0.5 01/14/2017 0514   BASOSABS 0.0 01/14/2017 0514    BMET    Component Value Date/Time   NA 143 01/17/2017 0708   K 3.5 01/17/2017 0708   CL 103 01/17/2017 0708   CO2 29 01/17/2017 0708   GLUCOSE 136 (H) 01/17/2017 0708   BUN 14 01/17/2017 0708   CREATININE 1.45 (H) 01/17/2017 0708   CALCIUM 8.7 (L) 01/17/2017 0708   GFRNONAA 40 (L) 01/17/2017 0708   GFRAA 47 (L) 01/17/2017 0708    INR    Component Value Date/Time   INR 1.14 01/08/2017 0426     Intake/Output Summary (Last 24 hours) at 01/21/17 0908 Last data filed at 01/20/17 1230  Gross per 24 hour  Intake              120 ml  Output                0 ml  Net              120 ml     Assessment:  56 y.o. female is s/p left aka for acute limb ischemia  Plan: F/u in office on 3/16 for staple removal Needs to be on plavix and statin at discharge   Radek Carnero C. Randie Heinzain, MD [facility] Office: [phone] Pager: [phone]  01/21/2017 9:08 AM

## 2017-01-21 NOTE — Progress Notes (Signed)
[place] PHYSICAL MEDICINE & REHABILITATION     PROGRESS NOTE    Subjective/Complaints: No issues overnite.  Pt notes some jerking all 4 ext even prior to admission , no neck pain    ROS: pt denies nausea, vomiting, diarrhea, shortness of breath or chest pain   Objective: Vital Signs: Blood pressure (!) 102/57, pulse 84, temperature 98.1 F (36.7 C), temperature source Oral, resp. rate 20, height 5\' 1"  (1.549 m), weight 50.8 kg (112 lb), SpO2 100 %. No results found. No results for input(s): WBC, HGB, HCT, PLT in the last 72 hours. No results for input(s): NA, K, CL, GLUCOSE, BUN, CREATININE, CALCIUM in the last 72 hours.  Invalid input(s): CO CBG (last 3)  No results for input(s): GLUCAP in the last 72 hours.  Wt Readings from Last 3 Encounters:  01/21/17 50.8 kg (112 lb)  01/13/17 56.9 kg (125 lb 7.1 oz)    Physical Exam:  HENT:  Head: Normocephalic.  Eyes: Left eye exhibits no discharge.  Neck: Normal range of motion. Neck supple. No thyromegalypresent.  Cardiovascular:RRR + murmur.  Respiratory: normal effort. No wheezing or rales.  GI: Soft. Bowel sounds are normal. She exhibits no distension.  Musculoskeletal: She exhibits edema(left AKA). Wearing ACE today Psychiatric: She has a normal mood and affect. Her behavior is normal. Thought contentnormal.  Skin. AKA wound clean, mild serosang drainage. Small ischemic areas Neurological: She is alertand oriented to person, place, and time.  Follow simple commands Motor: B/L UE are 4 to 4+/5 proximal to distal.  RLE: HF 3+/5, KE 4/5, ADP/PF 5/5 LLE: HF 4-/5  Decreased sensation in the RLE. DTRs 3+ in BUE and RLE Assessment/Plan: 1. Functional deficits secondary to left AKA which require 3+ hours per day of interdisciplinary therapy in a comprehensive inpatient rehab setting. Physiatrist is providing close team supervision and 24 hour management of active medical problems listed below. Physiatrist and rehab  team continue to assess barriers to discharge/monitor patient progress toward functional and medical goals.  Function:  Bathing Bathing position   Position: Shower  Bathing parts Body parts bathed by patient: Right arm, Left arm, Chest, Abdomen, Front perineal area, Buttocks, Right upper leg, Left upper leg, Right lower leg Body parts bathed by helper: Back  Bathing assist Assist Level: Supervision or verbal cues      Upper Body Dressing/Undressing Upper body dressing   What is the patient wearing?: Pull over shirt/dress     Pull over shirt/dress - Perfomed by patient: Thread/unthread right sleeve, Thread/unthread left sleeve, Put head through opening, Pull shirt over trunk   Button up shirt - Perfomed by patient: Thread/unthread right sleeve, Thread/unthread left sleeve, Pull shirt around back, Button/unbutton shirt      Upper body assist Assist Level: Set up      Lower Body Dressing/Undressing Lower body dressing   What is the patient wearing?: Pants, Non-skid slipper socks, Underwear Underwear - Performed by patient: Thread/unthread right underwear leg, Thread/unthread left underwear leg, Pull underwear up/down   Pants- Performed by patient: Thread/unthread right pants leg, Thread/unthread left pants leg, Pull pants up/down   Non-skid slipper socks- Performed by patient: Don/doff right sock               TED Hose - Performed by patient: Don/doff right TED hose TED Hose - Performed by helper: Don/doff right TED hose (RN request for OT to don TEDs due to R LE edema)  Lower body assist Assist for lower body dressing: Touching or steadying  assistance (Pt > 75%)      Toileting Toileting   Toileting steps completed by patient: Adjust clothing prior to toileting, Performs perineal hygiene Toileting steps completed by helper: Adjust clothing after toileting (due to shrinker sock waist strap) Toileting Assistive Devices: Grab bar or rail  Toileting assist Assist level:  Touching or steadying assistance (Pt.75%)   Transfers Chair/bed transfer   Chair/bed transfer method: Stand pivot Chair/bed transfer assist level: Supervision or verbal cues Chair/bed transfer assistive device: Walker     LPatent attorneyocomotion Ambulation     Max distance: 30 Assist level: Supervision or verbal cues   Wheelchair   Type: Manual Max wheelchair distance: 150 Assist Level: Supervision or verbal cues  Cognition Comprehension Comprehension assist level: Follows basic conversation/direction with extra time/assistive device  Expression Expression assist level: Expresses basic needs/ideas: With no assist  Social Interaction Social Interaction assist level: Interacts appropriately 90% of the time - Needs monitoring or encouragement for participation or interaction.  Problem Solving Problem solving assist level: Solves basic 90% of the time/requires cueing < 10% of the time  Memory Memory assist level: Requires cues to use assistive device   Medical Problem List and Plan: 1. Decreased functional mobilitysecondary to left AKA 01/09/2017 after failed thromboembolectomy/fasciotomy -cont CIR PT,OT, pt progressing well should be ready for d/c on 2/24                2. DVT Prophylaxis/Anticoagulation: SQ Heparin.Monitor for any bleeding episodes 3. Pain Management: Phantom pain increase Neurontin 400 mg 3 times a day, Robaxin and increase  hydrocodone as needed  -continue ACE wrap for edema control/protection 4. Mood: Zoloft 50 mg daily, BuSpar 15 mg every a.m. and 10 mg daily at bedtime, Xanax 0.25 mg 3 times a day as needed, sees [facility] as outpt- pt worried about losing RLE, per [facility], no severe vasc disease on R 5. Neuropsych: This patient iscapable of making decisions on herown behalf. 6. Skin/Wound Care: Routine skin checks -AK stump shrinker in place of ACE 7. Fluids/Electrolytes/Nutrition:   -  -hypoK resolved  -encourage PO 8.Acute on chronic  anemia/thrombocytopenia.   -hgb down to 8.2---recheck on Monday--no signs of major blood loss clinically  -platelets trending up 9.CKD stage III/rhabdo. Baseline creatinine around 1.6 follow-up renal services as needed.  -BUN/Cr decr to 16/1.8 today 10.Hyperlipidemia. Lipitor 11.Hypothyroidism. Synthroid 12.  Hyperreflexia, no cervical spine sx or UE weakness, suspect meds, SSRI +/- buspirone           LOS (Days) 8 A FACE TO FACE EVALUATION WAS PERFORMED  Erick ColaceKIRSTEINS,Lil Lepage E, MD 01/21/2017 8:19 AM

## 2017-01-21 NOTE — Progress Notes (Signed)
Social Work  Discharge Note  The overall goal for the admission was met for:   Discharge location: Yes-HOME WITH HUSBAND WHO CAN PROVIDE 24 HR SUPERVISION  Length of Stay: Yes-9 DAYS  Discharge activity level: Yes-SUPERVISION LEVEL  Home/community participation: Yes  Services provided included: MD, RD, PT, OT, RN, CM, TR, Pharmacy and SW  Financial Services: Medicaid and Private Insurance: [place]  Follow-up services arranged: Home Health: [facility], DME: [place] and Patient/Family has no preference for HH/DME agencies  Comments (or additional information):PT DID VERY WELL AND IS ADJUSTING TO THE LOSS OF HER LEG. HUSBAND WAS HERE AND WENT THROUGH THERAPIES WITH PT. ALL FEEL COMFORTABLE WITH HER CARE AND PT IS READY TO GO HOME.  Patient/Family verbalized understanding of follow-up arrangements: Yes  Individual responsible for coordination of the follow-up plan: SELF & JIMMY-HUSBAND  Confirmed correct DME delivered: Elease Hashimoto 01/21/2017    Elease Hashimoto

## 2017-01-21 NOTE — Discharge Summary (Signed)
Discharge summary job [id number]

## 2017-01-21 NOTE — Discharge Summary (Signed)
NAMLaverda Lynch:  Kincer, Janye                ACCOUNT NO.:  [id number]  MEDICAL RECORD NO.:  [id number]  LOCATION:                                 FACILITY:  PHYSICIAN:  Erick ColaceAndrew E. Kirsteins, M.D.   DATE OF BIRTH:  DATE OF ADMISSION:  01/13/2017 DATE OF DISCHARGE:  01/22/2017                              DISCHARGE SUMMARY   DISCHARGE DIAGNOSES: 1. Left above-knee amputation secondary to peripheral vascular     disease, after failed thromboembolectomy. 2. Pain management. 3. Depression. 4. Acute on chronic anemia. 5. Chronic kidney disease stage 3. 6. Hyperlipidemia. 7. Hypothyroidism. 8. Decreased nutritional storage.  HISTORY OF PRESENT ILLNESS:  This is a 56 year old right-handed female with history of hypertension, PTSD, chronic kidney disease stage 3, long- term tobacco abuse.  Lives with husband and niece, independent prior to admission.  Presented on January 08, 2017, with a cold, painful left foot, recent cut to her right hand, having stitches removed when she became nauseated, received a shot of Phenergan.  She returned home, began having pain in her entire left lower extremity.  [facility], placed on heparin therapy, [facility].  The patient with no palpable pulses in bilateral lower extremities, received bedside duplex that did have patent common femoral arteries bilaterally.  Vascular Surgery consulted.  Findings of chronic aortic occlusion.  There was some acute thrombus on the left.  Underwent left lower extremity thromboembolectomy, exposure of dorsalis pedis artery on January 08, 2017.  The patient with ongoing ischemic changes of left lower extremity, elevated CKs.  Underwent left AKA on January 09, 2017, per Dr. Lemar LivingsBrandon Cain.  Hospital course, pain management. Elevated creatinine at 2.14 from baseline 1.3-1.5.  Nephrology consulted, felt possibly related to contrast, possible rhabdo with latest creatinine stabilizing 2.00,  placed on Plavix for peripheral vascular disease.  Acute on chronic anemia.  Thrombocytopenia, 62,000. Hemoglobin 9.7, improved to 121,000.  Subcutaneous heparin initially held, later initiated after platelet count improved.  Physical and occupational therapy ongoing.  The patient was admitted for comprehensive rehab program.  PAST MEDICAL HISTORY:  See discharge diagnoses.  SOCIAL HISTORY:  Lives with spouse, independent prior to admission. Functional status upon admission to rehab services was +2 physical assist, sit to stand, mod to max assist 4-feet rolling walker, max total assist activities of daily living.  PHYSICAL EXAMINATION:  VITAL SIGNS:  Blood pressure 96/53, pulse 101, temperature 98, and respirations 16. GENERAL:  This was an alert female, in no acute distress.  Somewhat anxious. HEENT:  Pupils are round and reactive to light.  No JVD. CARDIAC:  Regular rate and rhythm.  No murmur. ABDOMEN:  Soft and nontender.  Good bowel sounds. LUNGS:  Clear to auscultation without wheeze. EXTREMITIES:  AKA site was dressed, appropriately tender.  No odor.  REHABILITATION HOSPITAL COURSE:  The patient was admitted to Inpatient Rehab Services with therapies initiated on a 3-hour daily basis, consisting of physical therapy, occupational therapy, and rehabilitation nursing.  The following issues were addressed during the patient's rehabilitation stay.  Pertaining to Ms. Kevorkian's left AKA, it remained stable.  She would follow up with Vascular Surgery.  She continued on subcutaneous  heparin for DVT prophylaxis.  Pain management with the use of Neurontin, titrated to 400 mg t.i.d. as well as Robaxin and oxycodone as needed.  She continued on Zoloft as well as BuSpar for history of depression.  She was cooperating with her therapies.  Blood pressures remained well controlled.  Acute on chronic anemia, hemoglobin 8.2, platelet count remaining stable.  Monitor closely while on  subcutaneous heparin for DVT prophylaxis.  Followup hemoglobin, stable.  Latest creatinine 1.45, followed by Renal Services which would be ongoing as an outpatient.  She continued on Synthroid for hypothyroidism.  Noted history of tobacco abuse.  Maintained on a NicoDerm patch.  She received full counsel in regard to cessation of nicotine products.  The patient received weekly collaborative interdisciplinary team conferences to discuss estimated length of stay, family teaching, any barriers to her discharge.  She was ambulating 30 feet, supervision, rolling walker, toilet transfers with supervision.  She could ambulate to the bathroom with a rolling walker, stand at the sink for activities of daily living for dressing, grooming, and homemaking.  Overall her strength and endurance continued to greatly improved and she was encouraged with her overall progress.  Family teaching completed and plan discharge to home.  DISCHARGE MEDICATIONS: 1. Lipitor 80 mg p.o. daily. 2. BuSpar 15 mg a.m., 10 mg at bedtime. 3. Plavix 75 mg p.o. daily. 4. Neurontin 400 mg p.o. t.i.d. 5. Synthroid 50 mcg p.o. daily. 6. NicoDerm patch taper as directed. 7. Protonix 40 mg daily. 8. Zoloft 50 mg p.o. daily. 9. Xanax 0.25 mg t.i.d. as needed. 10.Hydrocodone 1-2 tablets every 4 hours as needed pain. 11.Robaxin 750 mg p.o. every 8 hours as needed muscle spasms.  DISCHARGE INSTRUCTIONS:  Her diet was regular.  She would follow up with [facility]; Dr. Lemar Livings, Vascular Surgery, call for appointment; Dr. [name] in 2 weeks, call for appointment; Dr. Syble Creek, medical management.     Mariam Dollar, P.A.   ______________________________ Erick Colace, M.D.    DA/MEDQ  D:  01/21/2017  T:  01/21/2017  Job:  [id number]  cc:   Lemar Livings, MD Maree Krabbe, M.D. Syble Creek, MD Maryla Morrow, MD

## 2017-01-21 NOTE — Discharge Instructions (Signed)
Inpatient Rehab Discharge Instructions  Alison Fergusonammy Mozley Discharge date and time: No discharge date for patient encounter.   Activities/Precautions/ Functional Status: Activity: activity as tolerated Diet: regular diet Wound Care: keep wound clean and dry Functional status:  ___ No restrictions     ___ Walk up steps independently ___ 24/7 supervision/assistance   ___ Walk up steps with assistance ___ Intermittent supervision/assistance  ___ Bathe/dress independently ___ Walk with walker     _x__ Bathe/dress with assistance ___ Walk Independently    ___ Shower independently ___ Walk with assistance    ___ Shower with assistance ___ No alcohol     ___ Return to work/school ________  Special Instructions:  Follow-up with Dr. Lemar LivingsBrandon Cain vascular surgery for removal of staples in approximately 1-2 weeks  No smoking    COMMUNITY REFERRALS UPON DISCHARGE:    Home Health:   PT & RN   Agency:[facility] Phone:[phone]   Date of last service:01/22/2017  Medical Equipment/Items Ordered:WHEELCHAIR  Agency/Supplier:[facility]   [phone]   GENERAL COMMUNITY RESOURCES FOR PATIENT/FAMILY: Support Groups:[facility] SUPPORT GROUP FIRST Tuesday OF EACH MONTH @ 7:00 PM AT [facility] QUESTIONS CONTACT ROBIN @ [phone]  My questions have been answered and I understand these instructions. I will adhere to these goals and the provided educational materials after my discharge from the hospital.  Patient/Caregiver Signature _______________________________ Date __________  Clinician Signature _______________________________________ Date __________  Please bring this form and your medication list with you to all your follow-up doctor's appointments.

## 2017-01-21 NOTE — Progress Notes (Signed)
Physical Therapy Discharge Summary  Patient Details  Name: Alison Lynch MRN: [id number] Date of Birth: June 05, 1961  Today's Date: 01/21/2017 PT Individual Time: 0804-0901 AND 1450-1600 PT Individual Time Calculation (min): 57 min  AND 64mn    Patient has met 11 of 11 long term goals due to improved activity tolerance, improved balance, increased strength and decreased pain.  Patient to discharge at a wheelchair level Supervision.   Patient's care partner is independent to provide the necessary physical assistance at discharge.   Recommendation:  Patient will benefit from ongoing skilled PT services in home health setting to continue to advance safe functional mobility, address ongoing impairments in strength, balance, endurance, safety with gait and transfers, and minimize fall risk.  Equipment: WC  Reasons for discharge: treatment goals met and discharge from hospital  Patient/family agrees with progress made and goals achieved: Yes   PT treatment:  Session 1:   PT instructed patient in grad day assessment to measure progress towards goals. Pt demonstrated need for supervision assist as stated below for all transfers including squat pivot, stand pivot, car transfer, and furniture transfer. Gait instructed by PT with supervision assist as indicated. PT aslo educated patient in D/c recommendations and safety for home environment.  [facility] and all needs met.    Session 2:  Pt received supine in bed and agreeable to PT. Supine>sit transfer without assist or cues.  WC mobility in community environemnt including over uneven cement surface at hospital entrance x [id number] through gift shop x [id number] Supervision assist with min cues for awareness of WC and improved anterior weight shift with propulsion to prevent posterior tipping and increased force of push to ascend hill.   PT instructed patient in WC [facility] through hall in  hospital x [id number] without cues or assistance on level ground in controlled environment.   Nustep strengthening and endurance training x 10 minutes level 3>4 with min cues for proper steps per minute <45.   All squat pivot transfers completed with supervision assist from PT with min cues for proper arm rest positioning to increase safety of transfer.   Patient returned to room and [facility] reach and all needs met.         PT Discharge Precautions/Restrictions Precautions Precautions: Fall Restrictions Weight Bearing Restrictions: Yes LLE Weight Bearing: Non weight bearing Vital Signs Therapy Vitals Temp: 98.1 F (36.7 C) Temp Source: Oral Pulse Rate: 84 Resp: 20 BP: (!) 102/57 Patient Position (if appropriate): Lying Oxygen Therapy SpO2: 100 % O2 Device: Not Delivered Pain Pain Assessment Pain Assessment: 0-10 Pain Score: 6  Pain Type: Surgical pain Pain Location: Leg Pain Orientation: Left Patients Stated Pain Goal: 3 Pain Intervention(s): Medication (See eMAR) Vision/Perception  Perception Comments: WFL  Cognition Orientation Level: Oriented X4 Sustained Attention: Appears intact Selective Attention: Appears intact Memory: Appears intact Awareness: Appears intact Problem Solving: Appears intact Safety/Judgment: Appears intact Sensation Sensation Light Touch: Appears Intact Proprioception: Appears Intact Additional Comments: Phantom sensation in LLE.  Coordination Gross Motor Movements are Fluid and Coordinated: Yes Fine Motor Movements are Fluid and Coordinated: Yes Finger Nose Finger Test: WFL [facility]  Motor Motor: Within Functional Limits Motor - Discharge Observations: weakness secondary to surgery in the LLE   Mobility Bed Mobility Rolling Right: 6: Modified independent (Device/Increase time) Rolling Left: 6: Modified independent (Device/Increase time) Supine to Sit: 6: Modified independent  (  Device/Increase time) Sit to Supine: 6: Modified independent (Device/Increase time) Transfers Transfers: Yes Sit to Stand: 5: Supervision Sit to Stand Details: Verbal cues for technique Stand to Sit: 5: Supervision Stand to Sit Details (indicate cue type and reason): Verbal cues for technique Stand Pivot Transfer Details: Verbal cues for technique Squat Pivot Transfers: 5: Supervision Squat Pivot Transfer Details: Verbal cues for safe use of DME/AE Locomotion  Ambulation Ambulation: Yes Ambulation/Gait Assistance: 5: Supervision Ambulation Distance (Feet): 50 Feet Assistive device: Rolling walker Ambulation/Gait Assistance Details: Verbal cues for gait pattern Gait Gait: Yes Gait Pattern: Impaired Gait Pattern: Step-to pattern Stairs / Additional Locomotion Stairs: Yes Stairs Assistance: 4: Min assist Stairs Assistance Details: Verbal cues for technique;Verbal cues for precautions/safety;Verbal cues for gait pattern Stair Management Technique: Two rails Number of Stairs: 4 Height of Stairs: 3 Wheelchair Mobility Wheelchair Mobility: Yes Wheelchair Assistance: 6: Modified independent (Device/Increase time) Environmental health practitioner: Both upper extremities Wheelchair Parts Management: Independent Distance: 263f   Trunk/Postural Assessment  Cervical Assessment Cervical Assessment: Within Functional Limits Thoracic Assessment Thoracic Assessment: Within Functional Limits Lumbar Assessment Lumbar Assessment: Within Functional Limits Postural Control Postural Control: Within Functional Limits  Balance Balance Balance Assessed: Yes Static Sitting Balance Static Sitting - Level of Assistance: 6: Modified independent (Device/Increase time) Static Standing Balance Static Standing - Level of Assistance: 5: Stand by assistance (with 1 UE support ) Dynamic Standing Balance Dynamic Standing - Level of Assistance: 5: Stand by assistance Dynamic Standing - Comments: with RW support   Extremity Assessment      RLE Assessment RLE Assessment: Within Functional Limits (Grossly 4+/5 throughout LE. ) LLE Assessment LLE Assessment: Exceptions to [facility](Near full range AROM at hip. 4/5 at hip with increased pain in at surgical site with testing. )   See Function Navigator for Current Functional Status.  ALorie Phenix2/23/2018, 8:57 AM

## 2017-01-21 NOTE — Progress Notes (Signed)
Occupational Therapy Discharge Summary  Patient Details  Name: Alison Lynch MRN: [id number] Date of Birth: 28-Jul-1961  Today's Date: 01/21/2017 OT Individual Time: 4098-1191 OT Individual Time Calculation (min): 54 min    Session Note:  Pt completed shower and dressing during session.  Overall supervision for all aspects, including donning shrinker sock.  She was able use the RW for all transfers with supervision throughout session.  Once bathing and dressing was completed she was able to use her wheelchair to maneuver around to clean up items in the room in preparation for discharge.  Pt transferred squat pivot to bed from wheelchair X2 to end session, both with supervision but she needed min instructional cueing to remember to remove the arm rest on the left side.    Patient has met 12 of 12 long term goals due to improved activity tolerance, improved balance and postural control.  Patient to discharge at overall Supervision level.  Patient's care partner is independent to provide the necessary physical and cognitive assistance at discharge.    Reasons goals not met: NA  Recommendation:  Patient will benefit from ongoing skilled OT services in home health setting to continue to advance functional skills in the area of BADL and Reduce care partner burden.  Feel pt is making steady progress but will benefit from continued [place] for safety and progression to modified independent level in familiar setting.  Pt still needs cueing for hand placement and sequencing for shower and toilet transfers, as well as sit to stand.    Equipment: No equipment provided  Reasons for discharge: treatment goals met and discharge from hospital  Patient/family agrees with progress made and goals achieved: Yes  OT Discharge Precautions/Restrictions  Precautions Precautions: Fall Restrictions Weight Bearing Restrictions: Yes LLE Weight Bearing: Non weight bearing  Pain Pain Assessment Pain Assessment:  0-10 Pain Score: 4  Pain Type: Acute pain Pain Location: Leg Pain Orientation: Left Pain Descriptors / Indicators: Aching;Throbbing Pain Frequency: Constant Pain Onset: On-going Patients Stated Pain Goal: 3 Pain Intervention(s): Medication (See eMAR) ADL  See Function Section of chart for details Vision/Perception  Vision- History Baseline Vision/History: Wears glasses Wears Glasses: Reading only Patient Visual Report: No change from baseline Vision- Assessment Vision Assessment?: No apparent visual deficits  Cognition Overall Cognitive Status: History of cognitive impairments - at baseline Arousal/Alertness: Awake/alert Orientation Level: Oriented X4 Attention: Sustained;Selective Sustained Attention: Appears intact Selective Attention: Appears intact Memory: Impaired (pt with decreased carryover at times) Memory Impairment: Decreased recall of new information;Storage deficit Awareness: Appears intact Problem Solving: Appears intact Safety/Judgment: Appears intact Sensation Sensation Light Touch: Appears Intact Stereognosis: Appears Intact Hot/Cold: Appears Intact Proprioception: Appears Intact Coordination Gross Motor Movements are Fluid and Coordinated: Yes Fine Motor Movements are Fluid and Coordinated: Yes Finger Nose Finger Test: [facility] Motor  Motor Motor - Discharge Observations: weakness secondary to surgery in the LLE  Mobility  Bed Mobility Bed Mobility: Rolling Right;Rolling Left;Supine to Sit;Sit to Supine Rolling Right: 6: Modified independent (Device/Increase time) Rolling Right Details: Verbal cues for technique;Verbal cues for precautions/safety Rolling Left: 6: Modified independent (Device/Increase time) Rolling Left Details: Verbal cues for technique;Verbal cues for precautions/safety Supine to Sit: 6: Modified independent (Device/Increase time) Supine to Sit Details: Verbal cues for precautions/safety;Verbal cues for technique Sit to Supine: 6:  Modified independent (Device/Increase time) Sit to Supine - Details: Verbal cues for technique;Verbal cues for precautions/safety Transfers Transfers: Sit to Stand;Stand to Sit Sit to Stand: 5: Supervision;Without upper extremity assist;From chair/3-in-1;With armrests Sit to Stand Details:  Verbal cues for technique Stand to Sit: 5: Supervision;With upper extremity assist;To bed;To chair/3-in-1 Stand to Sit Details (indicate cue type and reason): Verbal cues for technique  Trunk/Postural Assessment  Cervical Assessment Cervical Assessment: Within Functional Limits Thoracic Assessment Thoracic Assessment: Within Functional Limits Lumbar Assessment Lumbar Assessment: Within Functional Limits Postural Control Postural Control: Within Functional Limits  Balance Balance Balance Assessed: Yes Static Sitting Balance Static Sitting - Level of Assistance: 6: Modified independent (Device/Increase time) Dynamic Sitting Balance Dynamic Sitting - Balance Support: During functional activity Dynamic Sitting - Level of Assistance: 6: Modified independent (Device/Increase time) Static Standing Balance Static Standing - Balance Support: During functional activity Static Standing - Level of Assistance: 5: Stand by assistance Dynamic Standing Balance Dynamic Standing - Balance Support: During functional activity;Bilateral upper extremity supported Dynamic Standing - Level of Assistance: 5: Stand by assistance Dynamic Standing - Comments: with RW support  Extremity/Trunk Assessment RUE Assessment RUE Assessment: Within Functional Limits LUE Assessment LUE Assessment: Within Functional Limits   See Function Navigator for Current Functional Status.  Yaelis Scharfenberg OTR/L 01/21/2017, 12:43 PM

## 2017-01-22 NOTE — Progress Notes (Signed)
Patient discharged home.  Left floor via wheelchair, escorted by nursing staff and family.  Patient verbalized understanding of discharge instructions as given by Deatra Inaan Angiulli, PA.  All patient belongings sent with patient, including DME.  Provided patient and family education regarding limb wrapping and incision care.  Patient and family verbalized understanding.  Appears to be in no immediate distress at this time.  Dani Gobbleeardon, Cynara Tatham J, RN

## 2017-01-22 NOTE — Progress Notes (Signed)
  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 She feels well. No pain.   Objective: Vital Signs: Blood pressure (!) 95/52, pulse 89, temperature 98.1 F (36.7 C), temperature source Oral, resp. rate 18, height 5\' 1"  (1.549 m), weight 112 lb (50.8 kg), SpO2 95 %.  Physical Exam:   No acute distress. Chest clear to auscultation. Cardiac exam S1 and S2 are regular abdominal bowel sounds, soft, right lower extremity without edema. Assessment/Plan: 1. Functional deficits secondary to left AKA  Medical Problem List and Plan: OK FOR DC TODAY 1. Decreased functional mobilitysecondary to left AKA 01/09/2017 after failed thromboembolectomy/fasciotomy  2. DVT Prophylaxis/Anticoagulation: 3. Pain Management: improved  -continue ACE wrap for edema control/protection 4. Mood: Zoloft 50 mg daily, BuSpar 15 mg every a.m. and 10 mg daily at bedtime, Xanax 0.25 mg 3 times a day as needed, sees [facility] as outpt- pt worried about losing RLE, per VVS, no severe vasc disease on R 5. Neuropsych: This patient iscapable of making decisions on herown behalf. 6. Skin/Wound Care: Routine skin checks -AK stump shrinker in place of ACE 7. Fluids/Electrolytes/Nutrition:   -  -hypoK resolved 8.Acute on chronic anemia/thrombocytopenia.   -hgb down to 8.2---recheck on Monday--no signs of major blood loss clinically  -platelets trending up 9.CKD stage III/rhabdo. Baseline creatinine around 1.6 follow-up renal services as needed.  -BUN/Cr decr to 16/1.8 today 10.Hyperlipidemia. Lipitor 11.Hypothyroidism. Synthroid           LOS (Days) 9 A FACE TO FACE EVALUATION WAS PERFORMED  Lindley MagnusBruce H Merion Grimaldo, MD 01/22/2017 9:04 AM

## 2017-01-24 ENCOUNTER — Telehealth: Payer: Self-pay | Admitting: *Deleted

## 2017-01-24 NOTE — Telephone Encounter (Signed)
Transitional Care call-I spoke with Alison Lynch(pt)    1. Are you/is patient experiencing any problems since coming home? Are there any questions regarding any aspect of care? No 2. Are there any questions regarding medications administration/dosing? Are meds being taken as prescribed? Patient should review meds with caller to confirm Yes 3. Have there been any falls?No 4. Has Home Health been to the house and/or have they contacted you? If not, have you tried to contact them? Can we help you contact them? They have not called as of yet 5. Are bowels and bladder emptying properly? Are there any unexpected incontinence issues? If applicable, is patient following bowel/bladder programs? No 6. Any fevers, problems with breathing, unexpected pain?No 7. Are there any skin problems or new areas of breakdown? No 8. Has the patient/family member arranged specialty MD follow up (ie cardiology/neurology/renal/surgical/etc)?  Can we help arrange? No 9. Does the patient need any other services or support that we can help arrange? No 10. Are caregivers following through as expected in assisting the patient? Yes 11. Has the patient quit smoking, drinking alcohol, or using drugs as recommended? N/A  Appointment time, Friday 9:30  Arrive by 9:00 to see Dr Wynn BankerKirsteins [phone]

## 2017-01-26 DIAGNOSIS — I129 Hypertensive chronic kidney disease with stage 1 through stage 4 chronic kidney disease, or unspecified chronic kidney disease: Secondary | ICD-10-CM | POA: Diagnosis not present

## 2017-01-26 DIAGNOSIS — Z7901 Long term (current) use of anticoagulants: Secondary | ICD-10-CM | POA: Diagnosis not present

## 2017-01-26 DIAGNOSIS — N183 Chronic kidney disease, stage 3 (moderate): Secondary | ICD-10-CM | POA: Diagnosis not present

## 2017-01-26 DIAGNOSIS — M810 Age-related osteoporosis without current pathological fracture: Secondary | ICD-10-CM | POA: Diagnosis not present

## 2017-01-26 DIAGNOSIS — Z7902 Long term (current) use of antithrombotics/antiplatelets: Secondary | ICD-10-CM | POA: Diagnosis not present

## 2017-01-26 DIAGNOSIS — D649 Anemia, unspecified: Secondary | ICD-10-CM | POA: Diagnosis not present

## 2017-01-26 DIAGNOSIS — E785 Hyperlipidemia, unspecified: Secondary | ICD-10-CM | POA: Diagnosis not present

## 2017-01-26 DIAGNOSIS — Z4781 Encounter for orthopedic aftercare following surgical amputation: Secondary | ICD-10-CM | POA: Diagnosis not present

## 2017-01-26 DIAGNOSIS — M5000 Cervical disc disorder with myelopathy, unspecified cervical region: Secondary | ICD-10-CM | POA: Diagnosis not present

## 2017-01-26 DIAGNOSIS — I739 Peripheral vascular disease, unspecified: Secondary | ICD-10-CM | POA: Diagnosis not present

## 2017-01-26 DIAGNOSIS — R69 Illness, unspecified: Secondary | ICD-10-CM | POA: Diagnosis not present

## 2017-01-28 ENCOUNTER — Encounter: Payer: Medicare HMO | Admitting: Physical Medicine & Rehabilitation

## 2017-01-28 DIAGNOSIS — E785 Hyperlipidemia, unspecified: Secondary | ICD-10-CM | POA: Diagnosis not present

## 2017-01-28 DIAGNOSIS — Z7901 Long term (current) use of anticoagulants: Secondary | ICD-10-CM | POA: Diagnosis not present

## 2017-01-28 DIAGNOSIS — N183 Chronic kidney disease, stage 3 (moderate): Secondary | ICD-10-CM | POA: Diagnosis not present

## 2017-01-28 DIAGNOSIS — I739 Peripheral vascular disease, unspecified: Secondary | ICD-10-CM | POA: Diagnosis not present

## 2017-01-28 DIAGNOSIS — D649 Anemia, unspecified: Secondary | ICD-10-CM | POA: Diagnosis not present

## 2017-01-28 DIAGNOSIS — R69 Illness, unspecified: Secondary | ICD-10-CM | POA: Diagnosis not present

## 2017-01-28 DIAGNOSIS — I129 Hypertensive chronic kidney disease with stage 1 through stage 4 chronic kidney disease, or unspecified chronic kidney disease: Secondary | ICD-10-CM | POA: Diagnosis not present

## 2017-01-28 DIAGNOSIS — M810 Age-related osteoporosis without current pathological fracture: Secondary | ICD-10-CM | POA: Diagnosis not present

## 2017-01-28 DIAGNOSIS — Z4781 Encounter for orthopedic aftercare following surgical amputation: Secondary | ICD-10-CM | POA: Diagnosis not present

## 2017-01-28 DIAGNOSIS — M5000 Cervical disc disorder with myelopathy, unspecified cervical region: Secondary | ICD-10-CM | POA: Diagnosis not present

## 2017-01-31 DIAGNOSIS — M810 Age-related osteoporosis without current pathological fracture: Secondary | ICD-10-CM | POA: Diagnosis not present

## 2017-01-31 DIAGNOSIS — N183 Chronic kidney disease, stage 3 (moderate): Secondary | ICD-10-CM | POA: Diagnosis not present

## 2017-01-31 DIAGNOSIS — E785 Hyperlipidemia, unspecified: Secondary | ICD-10-CM | POA: Diagnosis not present

## 2017-01-31 DIAGNOSIS — I739 Peripheral vascular disease, unspecified: Secondary | ICD-10-CM | POA: Diagnosis not present

## 2017-01-31 DIAGNOSIS — M5000 Cervical disc disorder with myelopathy, unspecified cervical region: Secondary | ICD-10-CM | POA: Diagnosis not present

## 2017-01-31 DIAGNOSIS — Z4781 Encounter for orthopedic aftercare following surgical amputation: Secondary | ICD-10-CM | POA: Diagnosis not present

## 2017-01-31 DIAGNOSIS — I129 Hypertensive chronic kidney disease with stage 1 through stage 4 chronic kidney disease, or unspecified chronic kidney disease: Secondary | ICD-10-CM | POA: Diagnosis not present

## 2017-01-31 DIAGNOSIS — Z7901 Long term (current) use of anticoagulants: Secondary | ICD-10-CM | POA: Diagnosis not present

## 2017-01-31 DIAGNOSIS — R69 Illness, unspecified: Secondary | ICD-10-CM | POA: Diagnosis not present

## 2017-01-31 DIAGNOSIS — D649 Anemia, unspecified: Secondary | ICD-10-CM | POA: Diagnosis not present

## 2017-02-01 DIAGNOSIS — D649 Anemia, unspecified: Secondary | ICD-10-CM | POA: Diagnosis not present

## 2017-02-01 DIAGNOSIS — R69 Illness, unspecified: Secondary | ICD-10-CM | POA: Diagnosis not present

## 2017-02-01 DIAGNOSIS — E785 Hyperlipidemia, unspecified: Secondary | ICD-10-CM | POA: Diagnosis not present

## 2017-02-01 DIAGNOSIS — M5000 Cervical disc disorder with myelopathy, unspecified cervical region: Secondary | ICD-10-CM | POA: Diagnosis not present

## 2017-02-01 DIAGNOSIS — I739 Peripheral vascular disease, unspecified: Secondary | ICD-10-CM | POA: Diagnosis not present

## 2017-02-01 DIAGNOSIS — M810 Age-related osteoporosis without current pathological fracture: Secondary | ICD-10-CM | POA: Diagnosis not present

## 2017-02-01 DIAGNOSIS — Z4781 Encounter for orthopedic aftercare following surgical amputation: Secondary | ICD-10-CM | POA: Diagnosis not present

## 2017-02-01 DIAGNOSIS — N183 Chronic kidney disease, stage 3 (moderate): Secondary | ICD-10-CM | POA: Diagnosis not present

## 2017-02-01 DIAGNOSIS — I129 Hypertensive chronic kidney disease with stage 1 through stage 4 chronic kidney disease, or unspecified chronic kidney disease: Secondary | ICD-10-CM | POA: Diagnosis not present

## 2017-02-01 DIAGNOSIS — Z7901 Long term (current) use of anticoagulants: Secondary | ICD-10-CM | POA: Diagnosis not present

## 2017-02-02 DIAGNOSIS — I739 Peripheral vascular disease, unspecified: Secondary | ICD-10-CM | POA: Diagnosis not present

## 2017-02-02 DIAGNOSIS — Z89612 Acquired absence of left leg above knee: Secondary | ICD-10-CM | POA: Diagnosis not present

## 2017-02-02 DIAGNOSIS — N183 Chronic kidney disease, stage 3 (moderate): Secondary | ICD-10-CM | POA: Diagnosis not present

## 2017-02-02 DIAGNOSIS — R69 Illness, unspecified: Secondary | ICD-10-CM | POA: Diagnosis not present

## 2017-02-02 DIAGNOSIS — E784 Other hyperlipidemia: Secondary | ICD-10-CM | POA: Diagnosis not present

## 2017-02-03 DIAGNOSIS — I739 Peripheral vascular disease, unspecified: Secondary | ICD-10-CM | POA: Diagnosis not present

## 2017-02-03 DIAGNOSIS — E785 Hyperlipidemia, unspecified: Secondary | ICD-10-CM | POA: Diagnosis not present

## 2017-02-03 DIAGNOSIS — M5000 Cervical disc disorder with myelopathy, unspecified cervical region: Secondary | ICD-10-CM | POA: Diagnosis not present

## 2017-02-03 DIAGNOSIS — N183 Chronic kidney disease, stage 3 (moderate): Secondary | ICD-10-CM | POA: Diagnosis not present

## 2017-02-03 DIAGNOSIS — D649 Anemia, unspecified: Secondary | ICD-10-CM | POA: Diagnosis not present

## 2017-02-03 DIAGNOSIS — Z7901 Long term (current) use of anticoagulants: Secondary | ICD-10-CM | POA: Diagnosis not present

## 2017-02-03 DIAGNOSIS — M810 Age-related osteoporosis without current pathological fracture: Secondary | ICD-10-CM | POA: Diagnosis not present

## 2017-02-03 DIAGNOSIS — R69 Illness, unspecified: Secondary | ICD-10-CM | POA: Diagnosis not present

## 2017-02-03 DIAGNOSIS — I129 Hypertensive chronic kidney disease with stage 1 through stage 4 chronic kidney disease, or unspecified chronic kidney disease: Secondary | ICD-10-CM | POA: Diagnosis not present

## 2017-02-03 DIAGNOSIS — Z4781 Encounter for orthopedic aftercare following surgical amputation: Secondary | ICD-10-CM | POA: Diagnosis not present

## 2017-02-04 ENCOUNTER — Encounter: Payer: Self-pay | Admitting: Vascular Surgery

## 2017-02-07 DIAGNOSIS — M810 Age-related osteoporosis without current pathological fracture: Secondary | ICD-10-CM | POA: Diagnosis not present

## 2017-02-07 DIAGNOSIS — E785 Hyperlipidemia, unspecified: Secondary | ICD-10-CM | POA: Diagnosis not present

## 2017-02-07 DIAGNOSIS — D649 Anemia, unspecified: Secondary | ICD-10-CM | POA: Diagnosis not present

## 2017-02-07 DIAGNOSIS — Z4781 Encounter for orthopedic aftercare following surgical amputation: Secondary | ICD-10-CM | POA: Diagnosis not present

## 2017-02-07 DIAGNOSIS — Z7901 Long term (current) use of anticoagulants: Secondary | ICD-10-CM | POA: Diagnosis not present

## 2017-02-07 DIAGNOSIS — M5000 Cervical disc disorder with myelopathy, unspecified cervical region: Secondary | ICD-10-CM | POA: Diagnosis not present

## 2017-02-07 DIAGNOSIS — I739 Peripheral vascular disease, unspecified: Secondary | ICD-10-CM | POA: Diagnosis not present

## 2017-02-07 DIAGNOSIS — R69 Illness, unspecified: Secondary | ICD-10-CM | POA: Diagnosis not present

## 2017-02-07 DIAGNOSIS — N183 Chronic kidney disease, stage 3 (moderate): Secondary | ICD-10-CM | POA: Diagnosis not present

## 2017-02-07 DIAGNOSIS — I129 Hypertensive chronic kidney disease with stage 1 through stage 4 chronic kidney disease, or unspecified chronic kidney disease: Secondary | ICD-10-CM | POA: Diagnosis not present

## 2017-02-08 DIAGNOSIS — M810 Age-related osteoporosis without current pathological fracture: Secondary | ICD-10-CM | POA: Diagnosis not present

## 2017-02-08 DIAGNOSIS — E785 Hyperlipidemia, unspecified: Secondary | ICD-10-CM | POA: Diagnosis not present

## 2017-02-08 DIAGNOSIS — Z4781 Encounter for orthopedic aftercare following surgical amputation: Secondary | ICD-10-CM | POA: Diagnosis not present

## 2017-02-08 DIAGNOSIS — D649 Anemia, unspecified: Secondary | ICD-10-CM | POA: Diagnosis not present

## 2017-02-08 DIAGNOSIS — I129 Hypertensive chronic kidney disease with stage 1 through stage 4 chronic kidney disease, or unspecified chronic kidney disease: Secondary | ICD-10-CM | POA: Diagnosis not present

## 2017-02-08 DIAGNOSIS — I739 Peripheral vascular disease, unspecified: Secondary | ICD-10-CM | POA: Diagnosis not present

## 2017-02-08 DIAGNOSIS — R69 Illness, unspecified: Secondary | ICD-10-CM | POA: Diagnosis not present

## 2017-02-08 DIAGNOSIS — N183 Chronic kidney disease, stage 3 (moderate): Secondary | ICD-10-CM | POA: Diagnosis not present

## 2017-02-08 DIAGNOSIS — M5000 Cervical disc disorder with myelopathy, unspecified cervical region: Secondary | ICD-10-CM | POA: Diagnosis not present

## 2017-02-08 DIAGNOSIS — Z7901 Long term (current) use of anticoagulants: Secondary | ICD-10-CM | POA: Diagnosis not present

## 2017-02-10 DIAGNOSIS — D649 Anemia, unspecified: Secondary | ICD-10-CM | POA: Diagnosis not present

## 2017-02-10 DIAGNOSIS — I739 Peripheral vascular disease, unspecified: Secondary | ICD-10-CM | POA: Diagnosis not present

## 2017-02-10 DIAGNOSIS — Z4781 Encounter for orthopedic aftercare following surgical amputation: Secondary | ICD-10-CM | POA: Diagnosis not present

## 2017-02-10 DIAGNOSIS — E785 Hyperlipidemia, unspecified: Secondary | ICD-10-CM | POA: Diagnosis not present

## 2017-02-10 DIAGNOSIS — M810 Age-related osteoporosis without current pathological fracture: Secondary | ICD-10-CM | POA: Diagnosis not present

## 2017-02-10 DIAGNOSIS — I129 Hypertensive chronic kidney disease with stage 1 through stage 4 chronic kidney disease, or unspecified chronic kidney disease: Secondary | ICD-10-CM | POA: Diagnosis not present

## 2017-02-10 DIAGNOSIS — N183 Chronic kidney disease, stage 3 (moderate): Secondary | ICD-10-CM | POA: Diagnosis not present

## 2017-02-10 DIAGNOSIS — Z7901 Long term (current) use of anticoagulants: Secondary | ICD-10-CM | POA: Diagnosis not present

## 2017-02-10 DIAGNOSIS — R69 Illness, unspecified: Secondary | ICD-10-CM | POA: Diagnosis not present

## 2017-02-10 DIAGNOSIS — M5000 Cervical disc disorder with myelopathy, unspecified cervical region: Secondary | ICD-10-CM | POA: Diagnosis not present

## 2017-02-11 ENCOUNTER — Encounter: Payer: Self-pay | Admitting: Vascular Surgery

## 2017-02-11 ENCOUNTER — Ambulatory Visit (INDEPENDENT_AMBULATORY_CARE_PROVIDER_SITE_OTHER): Payer: Self-pay | Admitting: Vascular Surgery

## 2017-02-11 VITALS — BP 103/67 | HR 92 | Temp 97.1°F | Resp 14 | Ht 60.0 in | Wt 114.0 lb

## 2017-02-11 DIAGNOSIS — Z95828 Presence of other vascular implants and grafts: Secondary | ICD-10-CM

## 2017-02-11 DIAGNOSIS — Z89612 Acquired absence of left leg above knee: Secondary | ICD-10-CM

## 2017-02-11 DIAGNOSIS — I739 Peripheral vascular disease, unspecified: Secondary | ICD-10-CM

## 2017-02-11 NOTE — Progress Notes (Signed)
Subjective:     Patient ID: Alison Lynch, female   DOB: 09/19/1961, 56 y.o.   MRN: [id number]  HPI is Alison Lynch follows up from recent hospitalization that resulted in left above-the-knee amputation. At the time she also had kissing iliac stents. She is maintained on Plavix at this time. She is now at home following rehabilitation. She has persistent pain of her left thigh. She is not shower because of concern for the wound. Staples are in place at this time and are to be removed today. She has not had fevers chills or other constitutional symptoms.   Review of Systems Pain in left residual limb    Objective:   Physical Exam [id number] Palpable right femoral pulse Signals at right dp/pt that are multiphasic Left thigh with staples, minimal necrotic skin removed with staples with 1cm wound medially debrided with scissors    Assessment/plan     56 year old female follows up from recent procedure including left above-the-knee amputation and bilateral common and right external iliac artery stenting. She is to continue Plavix and her statin drug at this time and I have encouraged continued nonsmoking although she was smoking a little bit at this time. She will follow up in 2 months with aortoiliac duplex and ABIs to evaluate her stents. I've instructed her on local wound care which is really just small gauze dressing on the medial aspect of her wound from her above-the-knee amputation site. She demonstrates good understanding was here in 2 months should she not be seen sooner.    Alison Lynch C. Randie Heinzain, MD [facility] Office: [phone] Pager: [phone]

## 2017-02-14 DIAGNOSIS — I129 Hypertensive chronic kidney disease with stage 1 through stage 4 chronic kidney disease, or unspecified chronic kidney disease: Secondary | ICD-10-CM | POA: Diagnosis not present

## 2017-02-14 DIAGNOSIS — M810 Age-related osteoporosis without current pathological fracture: Secondary | ICD-10-CM | POA: Diagnosis not present

## 2017-02-14 DIAGNOSIS — D649 Anemia, unspecified: Secondary | ICD-10-CM | POA: Diagnosis not present

## 2017-02-14 DIAGNOSIS — I739 Peripheral vascular disease, unspecified: Secondary | ICD-10-CM | POA: Diagnosis not present

## 2017-02-14 DIAGNOSIS — Z4781 Encounter for orthopedic aftercare following surgical amputation: Secondary | ICD-10-CM | POA: Diagnosis not present

## 2017-02-14 DIAGNOSIS — M5000 Cervical disc disorder with myelopathy, unspecified cervical region: Secondary | ICD-10-CM | POA: Diagnosis not present

## 2017-02-14 DIAGNOSIS — N183 Chronic kidney disease, stage 3 (moderate): Secondary | ICD-10-CM | POA: Diagnosis not present

## 2017-02-14 DIAGNOSIS — R69 Illness, unspecified: Secondary | ICD-10-CM | POA: Diagnosis not present

## 2017-02-14 DIAGNOSIS — E785 Hyperlipidemia, unspecified: Secondary | ICD-10-CM | POA: Diagnosis not present

## 2017-02-14 DIAGNOSIS — Z7901 Long term (current) use of anticoagulants: Secondary | ICD-10-CM | POA: Diagnosis not present

## 2017-02-14 NOTE — Addendum Note (Signed)
Addended by: Burton ApleyPETTY, Tiny Chaudhary A on: 02/14/2017 10:03 AM   Modules accepted: Orders

## 2017-02-15 DIAGNOSIS — N183 Chronic kidney disease, stage 3 (moderate): Secondary | ICD-10-CM | POA: Diagnosis not present

## 2017-02-15 DIAGNOSIS — M5000 Cervical disc disorder with myelopathy, unspecified cervical region: Secondary | ICD-10-CM | POA: Diagnosis not present

## 2017-02-15 DIAGNOSIS — Z7901 Long term (current) use of anticoagulants: Secondary | ICD-10-CM | POA: Diagnosis not present

## 2017-02-15 DIAGNOSIS — Z4781 Encounter for orthopedic aftercare following surgical amputation: Secondary | ICD-10-CM | POA: Diagnosis not present

## 2017-02-15 DIAGNOSIS — D649 Anemia, unspecified: Secondary | ICD-10-CM | POA: Diagnosis not present

## 2017-02-15 DIAGNOSIS — I129 Hypertensive chronic kidney disease with stage 1 through stage 4 chronic kidney disease, or unspecified chronic kidney disease: Secondary | ICD-10-CM | POA: Diagnosis not present

## 2017-02-15 DIAGNOSIS — I739 Peripheral vascular disease, unspecified: Secondary | ICD-10-CM | POA: Diagnosis not present

## 2017-02-15 DIAGNOSIS — M810 Age-related osteoporosis without current pathological fracture: Secondary | ICD-10-CM | POA: Diagnosis not present

## 2017-02-15 DIAGNOSIS — E785 Hyperlipidemia, unspecified: Secondary | ICD-10-CM | POA: Diagnosis not present

## 2017-02-15 DIAGNOSIS — R69 Illness, unspecified: Secondary | ICD-10-CM | POA: Diagnosis not present

## 2017-02-17 ENCOUNTER — Inpatient Hospital Stay: Payer: Medicare HMO | Admitting: Physical Medicine & Rehabilitation

## 2017-02-17 DIAGNOSIS — M4712 Other spondylosis with myelopathy, cervical region: Secondary | ICD-10-CM | POA: Diagnosis not present

## 2017-02-17 DIAGNOSIS — Z89612 Acquired absence of left leg above knee: Secondary | ICD-10-CM | POA: Diagnosis not present

## 2017-02-18 ENCOUNTER — Encounter: Payer: Self-pay | Admitting: Physical Medicine & Rehabilitation

## 2017-02-18 ENCOUNTER — Ambulatory Visit (HOSPITAL_BASED_OUTPATIENT_CLINIC_OR_DEPARTMENT_OTHER): Payer: Medicare HMO | Admitting: Physical Medicine & Rehabilitation

## 2017-02-18 ENCOUNTER — Encounter: Payer: Medicare HMO | Attending: Physical Medicine & Rehabilitation

## 2017-02-18 VITALS — BP 94/62 | HR 97 | Resp 14

## 2017-02-18 DIAGNOSIS — Z89612 Acquired absence of left leg above knee: Secondary | ICD-10-CM | POA: Insufficient documentation

## 2017-02-18 DIAGNOSIS — M180 Bilateral primary osteoarthritis of first carpometacarpal joints: Secondary | ICD-10-CM | POA: Insufficient documentation

## 2017-02-18 DIAGNOSIS — N183 Chronic kidney disease, stage 3 (moderate): Secondary | ICD-10-CM | POA: Diagnosis not present

## 2017-02-18 DIAGNOSIS — Z79899 Other long term (current) drug therapy: Secondary | ICD-10-CM | POA: Diagnosis not present

## 2017-02-18 DIAGNOSIS — F329 Major depressive disorder, single episode, unspecified: Secondary | ICD-10-CM | POA: Insufficient documentation

## 2017-02-18 DIAGNOSIS — F4310 Post-traumatic stress disorder, unspecified: Secondary | ICD-10-CM | POA: Insufficient documentation

## 2017-02-18 DIAGNOSIS — I739 Peripheral vascular disease, unspecified: Secondary | ICD-10-CM | POA: Diagnosis not present

## 2017-02-18 DIAGNOSIS — E785 Hyperlipidemia, unspecified: Secondary | ICD-10-CM | POA: Insufficient documentation

## 2017-02-18 DIAGNOSIS — R69 Illness, unspecified: Secondary | ICD-10-CM | POA: Diagnosis not present

## 2017-02-18 DIAGNOSIS — I129 Hypertensive chronic kidney disease with stage 1 through stage 4 chronic kidney disease, or unspecified chronic kidney disease: Secondary | ICD-10-CM | POA: Insufficient documentation

## 2017-02-18 DIAGNOSIS — E039 Hypothyroidism, unspecified: Secondary | ICD-10-CM | POA: Insufficient documentation

## 2017-02-18 DIAGNOSIS — Z86718 Personal history of other venous thrombosis and embolism: Secondary | ICD-10-CM | POA: Diagnosis not present

## 2017-02-18 DIAGNOSIS — F17200 Nicotine dependence, unspecified, uncomplicated: Secondary | ICD-10-CM | POA: Insufficient documentation

## 2017-02-18 DIAGNOSIS — R609 Edema, unspecified: Secondary | ICD-10-CM | POA: Insufficient documentation

## 2017-02-18 DIAGNOSIS — M5000 Cervical disc disorder with myelopathy, unspecified cervical region: Secondary | ICD-10-CM | POA: Diagnosis not present

## 2017-02-18 DIAGNOSIS — G546 Phantom limb syndrome with pain: Secondary | ICD-10-CM | POA: Diagnosis not present

## 2017-02-18 DIAGNOSIS — M79605 Pain in left leg: Secondary | ICD-10-CM | POA: Insufficient documentation

## 2017-02-18 DIAGNOSIS — D649 Anemia, unspecified: Secondary | ICD-10-CM | POA: Diagnosis not present

## 2017-02-18 DIAGNOSIS — M810 Age-related osteoporosis without current pathological fracture: Secondary | ICD-10-CM | POA: Diagnosis not present

## 2017-02-18 DIAGNOSIS — F419 Anxiety disorder, unspecified: Secondary | ICD-10-CM | POA: Diagnosis not present

## 2017-02-18 DIAGNOSIS — Z7901 Long term (current) use of anticoagulants: Secondary | ICD-10-CM | POA: Diagnosis not present

## 2017-02-18 DIAGNOSIS — Z4781 Encounter for orthopedic aftercare following surgical amputation: Secondary | ICD-10-CM | POA: Diagnosis not present

## 2017-02-18 DIAGNOSIS — S78112A Complete traumatic amputation at level between left hip and knee, initial encounter: Secondary | ICD-10-CM

## 2017-02-18 MED ORDER — TRAMADOL HCL 50 MG PO TABS: 50.0000 mg | ORAL_TABLET | Freq: Every day | ORAL | 1 refills | Status: DC

## 2017-02-18 NOTE — Patient Instructions (Addendum)
Phantom pain- Take gabapentin one tablet 400mg  during the day and  Two tablet 800mg  at night  Take tramadol one tablet a day for stump pain  Continue therapy  Continue stump stocking  [facility]

## 2017-02-18 NOTE — Progress Notes (Signed)
Subjective:    Patient ID: Alison Lynch, female    DOB: 03/23/1961, 56 y.o.   MRN: [id number]  This is a 56 year old right-handed female with history of hypertension, PTSD, chronic kidney disease stage 3, long- term tobacco abuse.  Lives with husband and niece, independent prior to admission.  Presented on January 08, 2017, with a cold, painful left foot, recent cut to her right hand, having stitches removed when she became nauseated, received a shot of Phenergan.  She returned home, began having pain in her entire left lower extremity.  [facility], placed on heparin therapy, transferred to [facility].  The patient with no palpable pulses in bilateral lower extremities, received bedside duplex that did have patent common femoral arteries bilaterally.  Vascular Surgery consulted.  Findings of chronic aortic occlusion.  There was some acute thrombus on the left.  Underwent left lower extremity thromboembolectomy, exposure of dorsalis pedis artery on January 08, 2017.  The patient with ongoing ischemic changes of left lower extremity, elevated CKs.  Underwent left AKA on January 09, 2017, per Dr. Lemar LivingsBrandon Cain.  Hospital course, pain management. Elevated creatinine at 2.14 from baseline 1.3-1.5.  Nephrology consulted, felt possibly related to contrast, possible rhabdo with latest creatinine stabilizing 2.00, placed on Plavix for peripheral vascular disease.  Acute on chronic anemia.  Thrombocytopenia, 62,000. Hemoglobin 9.7, improved to 121,000.   DATE OF ADMISSION:  01/13/2017 DATE OF DISCHARGE:  01/22/2017 She was ambulating 30 feet, supervision, rolling walker, toilet transfers with supervision  HPI Smokes a couple puffs because of nerves She sees a psychiatrist, but not a psychologist. She takes antianxiety medications. Her main complaint is phantom limb pain which occurs mainly at night. She currently takes gabapentin 400 mg 3 times a day. She gets  stump pain about once a day, currently not taking any medications for this. She is receiving home health PT. She has followed up with her vascular surgeon and additional arterial Dopplers are planned in May. Sutures were removed from the left AKA. Wearing stump shrinker  Pain Inventory Average Pain 7 Pain Right Now 7 My pain is constant, sharp, burning, dull, stabbing, tingling and aching  In the last 24 hours, has pain interfered with the following? General activity 7 Relation with others 8 Enjoyment of life 8 What TIME of day is your pain at its worst? night Sleep (in general) Poor  Pain is worse with: sitting, inactivity and unsure Pain improves with: rest and medication Relief from Meds: 8  Mobility walk with assistance use a walker ability to climb steps?  no do you drive?  no use a wheelchair needs help with transfers  Function disabled: date disabled . I need assistance with the following:  bathing, household duties and shopping Do you have any goals in this area?  yes  Neuro/Psych numbness tingling confusion depression anxiety  Prior Studies hospital foillow up  Physicians involved in your care hospital follow up   History reviewed. No pertinent family history. Social History   Social History  . Marital status: Married    Spouse name: N/A  . Number of children: N/A  . Years of education: N/A   Social History Main Topics  . Smoking status: Current Every Day Smoker  . Smokeless tobacco: Never Used  . Alcohol use No  . Drug use: No  . Sexual activity: Not Asked   Other Topics Concern  . None   Social History Narrative  . None   Past Surgical History:  Procedure Laterality Date  . AMPUTATION Left 01/09/2017   Procedure: AMPUTATION ABOVE KNEE;  Surgeon: Maeola Harman, MD;  [facility];  Service: Vascular;  Laterality: Left;  . AORTOGRAM Left 01/08/2017   Procedure: Ultrasound Guided Cannulation Left Common Femoral Artery;  Aortagram;  Surgeon: Maeola Harman, MD;  [facility];  Service: Vascular;  Laterality: Left;  . ARTERY EXPLORATION Left 01/08/2017   Procedure: Left Common Femoral Artery Exploration;  Surgeon: Maeola Harman, MD;  Location[facility] OR;  Service: Vascular;  Laterality: Left;  . EMBOLECTOMY Left 01/08/2017   Procedure: Left Lower Extremity Thromboembolectomy;  Surgeon: Maeola Harman, MD;  Location: [facility] OR;  Service: Vascular;  Laterality: Left;  . ENDARTERECTOMY POPLITEAL Left 01/08/2017   Procedure: Left Popliteal Endarterectomy;  Surgeon: Maeola Harman, MD;  Location: [facility] OR;  Service: Vascular;  Laterality: Left;  . INSERTION OF ILIAC STENT Bilateral 01/08/2017   Procedure: Bilateral Common Iliac Stent and Left Popliteal Artery Stent;  Surgeon: Maeola Harman, MD;  Location: [facility] OR;  Service: Vascular;  Laterality: Bilateral;  . LOWER EXTREMITY ANGIOGRAM Bilateral 01/08/2017   Procedure: Bilateral Lower Extremity Angiogram;  Surgeon: Maeola Harman, MD;  Location: [facility] OR;  Service: Vascular;  Laterality: Bilateral;  . PATCH ANGIOPLASTY  01/08/2017   Procedure: Patch Angioplasty Left Popliteal Artery ;  Surgeon: Maeola Harman, MD;  Location: [facility] OR;  Service: Vascular;;   Past Medical History:  Diagnosis Date  . Cervical myelopathy (HCC)   . CKD (chronic kidney disease) stage 3, GFR 30-59 ml/min   . Depression   . Dysphagia   . Hx of migraine headaches   . Hyperlipidemia   . Hypertension   . Hypothyroid   . Osteoporosis   . Panic attacks   . PTSD (post-traumatic stress disorder)    BP 94/62   Pulse 97   Resp 14   SpO2 97%   Opioid Risk Score:   Fall Risk Score:  `1  Depression screen PHQ 2/9  No flowsheet data found.  Review of Systems  Constitutional: Negative.   HENT: Negative.   Eyes: Negative.   Respiratory: Negative.   Cardiovascular: Positive for leg swelling.  Gastrointestinal: Positive for diarrhea  and vomiting.  Endocrine: Negative.   Genitourinary: Negative.   Musculoskeletal: Positive for gait problem.  Allergic/Immunologic: Negative.   Neurological: Positive for numbness.       Tingling  Hematological: Negative.   Psychiatric/Behavioral: Positive for confusion and dysphoric mood. The patient is nervous/anxious.   All other systems reviewed and are negative.      Objective:   Physical Exam  Constitutional: She is oriented to person, place, and time. She appears well-developed and well-nourished.  HENT:  Head: Normocephalic and atraumatic.  Eyes: Conjunctivae and EOM are normal. Pupils are equal, round, and reactive to light.  Neck: Normal range of motion.  Neurological: She is alert and oriented to person, place, and time.  Motor strength is 5/5 bilateral deltoid by stress of grip Right hip flexor, knee extensor, dorsiflexor Left hip flexor, left hip adductor, left abductor, left hip extensor, all 4 minus/5.   Psychiatric: She has a normal mood and affect.  Nursing note and vitals reviewed.  Distal end of stump is nontender. Did not unwrap because she does not have her dressing supplies with her       Assessment & Plan:  1. Left AKA having some old healing issues following up with vascular surgery. Do not anticipate she'll be fitted  for prosthesis for the next couple months. Still has some edema  stump pain. This is not a major issue. Start tramadol 50 mg daily as needed  2. Anxiety predated her AKA, but has worsened Referral to Dr. Rometta Emery in [facility] to help with relaxation and pain  3. Phantom limb pain mainly at night, change gabapentin to 400 mg every morning, 800 mg daily at bedtime  Return to clinic in 6 weeks

## 2017-02-23 DIAGNOSIS — M810 Age-related osteoporosis without current pathological fracture: Secondary | ICD-10-CM | POA: Diagnosis not present

## 2017-02-23 DIAGNOSIS — D649 Anemia, unspecified: Secondary | ICD-10-CM | POA: Diagnosis not present

## 2017-02-23 DIAGNOSIS — M5000 Cervical disc disorder with myelopathy, unspecified cervical region: Secondary | ICD-10-CM | POA: Diagnosis not present

## 2017-02-23 DIAGNOSIS — Z7901 Long term (current) use of anticoagulants: Secondary | ICD-10-CM | POA: Diagnosis not present

## 2017-02-23 DIAGNOSIS — Z4781 Encounter for orthopedic aftercare following surgical amputation: Secondary | ICD-10-CM | POA: Diagnosis not present

## 2017-02-23 DIAGNOSIS — I739 Peripheral vascular disease, unspecified: Secondary | ICD-10-CM | POA: Diagnosis not present

## 2017-02-23 DIAGNOSIS — R69 Illness, unspecified: Secondary | ICD-10-CM | POA: Diagnosis not present

## 2017-02-23 DIAGNOSIS — N183 Chronic kidney disease, stage 3 (moderate): Secondary | ICD-10-CM | POA: Diagnosis not present

## 2017-02-23 DIAGNOSIS — E785 Hyperlipidemia, unspecified: Secondary | ICD-10-CM | POA: Diagnosis not present

## 2017-02-23 DIAGNOSIS — I129 Hypertensive chronic kidney disease with stage 1 through stage 4 chronic kidney disease, or unspecified chronic kidney disease: Secondary | ICD-10-CM | POA: Diagnosis not present

## 2017-02-24 DIAGNOSIS — M810 Age-related osteoporosis without current pathological fracture: Secondary | ICD-10-CM | POA: Diagnosis not present

## 2017-02-24 DIAGNOSIS — M5000 Cervical disc disorder with myelopathy, unspecified cervical region: Secondary | ICD-10-CM | POA: Diagnosis not present

## 2017-02-24 DIAGNOSIS — N183 Chronic kidney disease, stage 3 (moderate): Secondary | ICD-10-CM | POA: Diagnosis not present

## 2017-02-24 DIAGNOSIS — I129 Hypertensive chronic kidney disease with stage 1 through stage 4 chronic kidney disease, or unspecified chronic kidney disease: Secondary | ICD-10-CM | POA: Diagnosis not present

## 2017-02-24 DIAGNOSIS — Z7901 Long term (current) use of anticoagulants: Secondary | ICD-10-CM | POA: Diagnosis not present

## 2017-02-24 DIAGNOSIS — R69 Illness, unspecified: Secondary | ICD-10-CM | POA: Diagnosis not present

## 2017-02-24 DIAGNOSIS — D649 Anemia, unspecified: Secondary | ICD-10-CM | POA: Diagnosis not present

## 2017-02-24 DIAGNOSIS — E785 Hyperlipidemia, unspecified: Secondary | ICD-10-CM | POA: Diagnosis not present

## 2017-02-24 DIAGNOSIS — Z4781 Encounter for orthopedic aftercare following surgical amputation: Secondary | ICD-10-CM | POA: Diagnosis not present

## 2017-02-24 DIAGNOSIS — I739 Peripheral vascular disease, unspecified: Secondary | ICD-10-CM | POA: Diagnosis not present

## 2017-02-28 DIAGNOSIS — N183 Chronic kidney disease, stage 3 (moderate): Secondary | ICD-10-CM | POA: Diagnosis not present

## 2017-02-28 DIAGNOSIS — E785 Hyperlipidemia, unspecified: Secondary | ICD-10-CM | POA: Diagnosis not present

## 2017-02-28 DIAGNOSIS — M5000 Cervical disc disorder with myelopathy, unspecified cervical region: Secondary | ICD-10-CM | POA: Diagnosis not present

## 2017-02-28 DIAGNOSIS — R69 Illness, unspecified: Secondary | ICD-10-CM | POA: Diagnosis not present

## 2017-02-28 DIAGNOSIS — I129 Hypertensive chronic kidney disease with stage 1 through stage 4 chronic kidney disease, or unspecified chronic kidney disease: Secondary | ICD-10-CM | POA: Diagnosis not present

## 2017-02-28 DIAGNOSIS — Z7901 Long term (current) use of anticoagulants: Secondary | ICD-10-CM | POA: Diagnosis not present

## 2017-02-28 DIAGNOSIS — D649 Anemia, unspecified: Secondary | ICD-10-CM | POA: Diagnosis not present

## 2017-02-28 DIAGNOSIS — Z4781 Encounter for orthopedic aftercare following surgical amputation: Secondary | ICD-10-CM | POA: Diagnosis not present

## 2017-02-28 DIAGNOSIS — I739 Peripheral vascular disease, unspecified: Secondary | ICD-10-CM | POA: Diagnosis not present

## 2017-02-28 DIAGNOSIS — M810 Age-related osteoporosis without current pathological fracture: Secondary | ICD-10-CM | POA: Diagnosis not present

## 2017-03-01 DIAGNOSIS — M5000 Cervical disc disorder with myelopathy, unspecified cervical region: Secondary | ICD-10-CM | POA: Diagnosis not present

## 2017-03-01 DIAGNOSIS — E785 Hyperlipidemia, unspecified: Secondary | ICD-10-CM | POA: Diagnosis not present

## 2017-03-01 DIAGNOSIS — I129 Hypertensive chronic kidney disease with stage 1 through stage 4 chronic kidney disease, or unspecified chronic kidney disease: Secondary | ICD-10-CM | POA: Diagnosis not present

## 2017-03-01 DIAGNOSIS — Z4781 Encounter for orthopedic aftercare following surgical amputation: Secondary | ICD-10-CM | POA: Diagnosis not present

## 2017-03-01 DIAGNOSIS — N183 Chronic kidney disease, stage 3 (moderate): Secondary | ICD-10-CM | POA: Diagnosis not present

## 2017-03-01 DIAGNOSIS — I739 Peripheral vascular disease, unspecified: Secondary | ICD-10-CM | POA: Diagnosis not present

## 2017-03-01 DIAGNOSIS — Z7901 Long term (current) use of anticoagulants: Secondary | ICD-10-CM | POA: Diagnosis not present

## 2017-03-01 DIAGNOSIS — M810 Age-related osteoporosis without current pathological fracture: Secondary | ICD-10-CM | POA: Diagnosis not present

## 2017-03-01 DIAGNOSIS — D649 Anemia, unspecified: Secondary | ICD-10-CM | POA: Diagnosis not present

## 2017-03-01 DIAGNOSIS — R69 Illness, unspecified: Secondary | ICD-10-CM | POA: Diagnosis not present

## 2017-03-02 DIAGNOSIS — D649 Anemia, unspecified: Secondary | ICD-10-CM | POA: Diagnosis not present

## 2017-03-02 DIAGNOSIS — I129 Hypertensive chronic kidney disease with stage 1 through stage 4 chronic kidney disease, or unspecified chronic kidney disease: Secondary | ICD-10-CM | POA: Diagnosis not present

## 2017-03-02 DIAGNOSIS — Z4781 Encounter for orthopedic aftercare following surgical amputation: Secondary | ICD-10-CM | POA: Diagnosis not present

## 2017-03-02 DIAGNOSIS — M810 Age-related osteoporosis without current pathological fracture: Secondary | ICD-10-CM | POA: Diagnosis not present

## 2017-03-02 DIAGNOSIS — N183 Chronic kidney disease, stage 3 (moderate): Secondary | ICD-10-CM | POA: Diagnosis not present

## 2017-03-02 DIAGNOSIS — Z7901 Long term (current) use of anticoagulants: Secondary | ICD-10-CM | POA: Diagnosis not present

## 2017-03-02 DIAGNOSIS — M5000 Cervical disc disorder with myelopathy, unspecified cervical region: Secondary | ICD-10-CM | POA: Diagnosis not present

## 2017-03-02 DIAGNOSIS — R69 Illness, unspecified: Secondary | ICD-10-CM | POA: Diagnosis not present

## 2017-03-02 DIAGNOSIS — E785 Hyperlipidemia, unspecified: Secondary | ICD-10-CM | POA: Diagnosis not present

## 2017-03-02 DIAGNOSIS — I739 Peripheral vascular disease, unspecified: Secondary | ICD-10-CM | POA: Diagnosis not present

## 2017-03-07 DIAGNOSIS — N183 Chronic kidney disease, stage 3 (moderate): Secondary | ICD-10-CM | POA: Diagnosis not present

## 2017-03-07 DIAGNOSIS — R69 Illness, unspecified: Secondary | ICD-10-CM | POA: Diagnosis not present

## 2017-03-07 DIAGNOSIS — Z7901 Long term (current) use of anticoagulants: Secondary | ICD-10-CM | POA: Diagnosis not present

## 2017-03-07 DIAGNOSIS — E785 Hyperlipidemia, unspecified: Secondary | ICD-10-CM | POA: Diagnosis not present

## 2017-03-07 DIAGNOSIS — M810 Age-related osteoporosis without current pathological fracture: Secondary | ICD-10-CM | POA: Diagnosis not present

## 2017-03-07 DIAGNOSIS — M5000 Cervical disc disorder with myelopathy, unspecified cervical region: Secondary | ICD-10-CM | POA: Diagnosis not present

## 2017-03-07 DIAGNOSIS — I739 Peripheral vascular disease, unspecified: Secondary | ICD-10-CM | POA: Diagnosis not present

## 2017-03-07 DIAGNOSIS — Z4781 Encounter for orthopedic aftercare following surgical amputation: Secondary | ICD-10-CM | POA: Diagnosis not present

## 2017-03-07 DIAGNOSIS — I129 Hypertensive chronic kidney disease with stage 1 through stage 4 chronic kidney disease, or unspecified chronic kidney disease: Secondary | ICD-10-CM | POA: Diagnosis not present

## 2017-03-07 DIAGNOSIS — D649 Anemia, unspecified: Secondary | ICD-10-CM | POA: Diagnosis not present

## 2017-03-09 DIAGNOSIS — I739 Peripheral vascular disease, unspecified: Secondary | ICD-10-CM | POA: Diagnosis not present

## 2017-03-09 DIAGNOSIS — M5000 Cervical disc disorder with myelopathy, unspecified cervical region: Secondary | ICD-10-CM | POA: Diagnosis not present

## 2017-03-09 DIAGNOSIS — D649 Anemia, unspecified: Secondary | ICD-10-CM | POA: Diagnosis not present

## 2017-03-09 DIAGNOSIS — Z7901 Long term (current) use of anticoagulants: Secondary | ICD-10-CM | POA: Diagnosis not present

## 2017-03-09 DIAGNOSIS — N183 Chronic kidney disease, stage 3 (moderate): Secondary | ICD-10-CM | POA: Diagnosis not present

## 2017-03-09 DIAGNOSIS — I129 Hypertensive chronic kidney disease with stage 1 through stage 4 chronic kidney disease, or unspecified chronic kidney disease: Secondary | ICD-10-CM | POA: Diagnosis not present

## 2017-03-09 DIAGNOSIS — R69 Illness, unspecified: Secondary | ICD-10-CM | POA: Diagnosis not present

## 2017-03-09 DIAGNOSIS — E785 Hyperlipidemia, unspecified: Secondary | ICD-10-CM | POA: Diagnosis not present

## 2017-03-09 DIAGNOSIS — Z4781 Encounter for orthopedic aftercare following surgical amputation: Secondary | ICD-10-CM | POA: Diagnosis not present

## 2017-03-09 DIAGNOSIS — M810 Age-related osteoporosis without current pathological fracture: Secondary | ICD-10-CM | POA: Diagnosis not present

## 2017-03-10 DIAGNOSIS — M5000 Cervical disc disorder with myelopathy, unspecified cervical region: Secondary | ICD-10-CM | POA: Diagnosis not present

## 2017-03-10 DIAGNOSIS — N183 Chronic kidney disease, stage 3 (moderate): Secondary | ICD-10-CM | POA: Diagnosis not present

## 2017-03-10 DIAGNOSIS — Z7901 Long term (current) use of anticoagulants: Secondary | ICD-10-CM | POA: Diagnosis not present

## 2017-03-10 DIAGNOSIS — R69 Illness, unspecified: Secondary | ICD-10-CM | POA: Diagnosis not present

## 2017-03-10 DIAGNOSIS — Z4781 Encounter for orthopedic aftercare following surgical amputation: Secondary | ICD-10-CM | POA: Diagnosis not present

## 2017-03-10 DIAGNOSIS — M810 Age-related osteoporosis without current pathological fracture: Secondary | ICD-10-CM | POA: Diagnosis not present

## 2017-03-10 DIAGNOSIS — D649 Anemia, unspecified: Secondary | ICD-10-CM | POA: Diagnosis not present

## 2017-03-10 DIAGNOSIS — I739 Peripheral vascular disease, unspecified: Secondary | ICD-10-CM | POA: Diagnosis not present

## 2017-03-10 DIAGNOSIS — I129 Hypertensive chronic kidney disease with stage 1 through stage 4 chronic kidney disease, or unspecified chronic kidney disease: Secondary | ICD-10-CM | POA: Diagnosis not present

## 2017-03-10 DIAGNOSIS — E785 Hyperlipidemia, unspecified: Secondary | ICD-10-CM | POA: Diagnosis not present

## 2017-03-15 DIAGNOSIS — I129 Hypertensive chronic kidney disease with stage 1 through stage 4 chronic kidney disease, or unspecified chronic kidney disease: Secondary | ICD-10-CM | POA: Diagnosis not present

## 2017-03-15 DIAGNOSIS — I739 Peripheral vascular disease, unspecified: Secondary | ICD-10-CM | POA: Diagnosis not present

## 2017-03-15 DIAGNOSIS — M5000 Cervical disc disorder with myelopathy, unspecified cervical region: Secondary | ICD-10-CM | POA: Diagnosis not present

## 2017-03-15 DIAGNOSIS — N183 Chronic kidney disease, stage 3 (moderate): Secondary | ICD-10-CM | POA: Diagnosis not present

## 2017-03-15 DIAGNOSIS — E785 Hyperlipidemia, unspecified: Secondary | ICD-10-CM | POA: Diagnosis not present

## 2017-03-15 DIAGNOSIS — R69 Illness, unspecified: Secondary | ICD-10-CM | POA: Diagnosis not present

## 2017-03-15 DIAGNOSIS — Z4781 Encounter for orthopedic aftercare following surgical amputation: Secondary | ICD-10-CM | POA: Diagnosis not present

## 2017-03-15 DIAGNOSIS — M810 Age-related osteoporosis without current pathological fracture: Secondary | ICD-10-CM | POA: Diagnosis not present

## 2017-03-15 DIAGNOSIS — D649 Anemia, unspecified: Secondary | ICD-10-CM | POA: Diagnosis not present

## 2017-03-15 DIAGNOSIS — Z7901 Long term (current) use of anticoagulants: Secondary | ICD-10-CM | POA: Diagnosis not present

## 2017-03-16 DIAGNOSIS — R69 Illness, unspecified: Secondary | ICD-10-CM | POA: Diagnosis not present

## 2017-03-17 DIAGNOSIS — I129 Hypertensive chronic kidney disease with stage 1 through stage 4 chronic kidney disease, or unspecified chronic kidney disease: Secondary | ICD-10-CM | POA: Diagnosis not present

## 2017-03-17 DIAGNOSIS — Z7901 Long term (current) use of anticoagulants: Secondary | ICD-10-CM | POA: Diagnosis not present

## 2017-03-17 DIAGNOSIS — M810 Age-related osteoporosis without current pathological fracture: Secondary | ICD-10-CM | POA: Diagnosis not present

## 2017-03-17 DIAGNOSIS — E785 Hyperlipidemia, unspecified: Secondary | ICD-10-CM | POA: Diagnosis not present

## 2017-03-17 DIAGNOSIS — I739 Peripheral vascular disease, unspecified: Secondary | ICD-10-CM | POA: Diagnosis not present

## 2017-03-17 DIAGNOSIS — R69 Illness, unspecified: Secondary | ICD-10-CM | POA: Diagnosis not present

## 2017-03-17 DIAGNOSIS — N183 Chronic kidney disease, stage 3 (moderate): Secondary | ICD-10-CM | POA: Diagnosis not present

## 2017-03-17 DIAGNOSIS — M5000 Cervical disc disorder with myelopathy, unspecified cervical region: Secondary | ICD-10-CM | POA: Diagnosis not present

## 2017-03-17 DIAGNOSIS — Z4781 Encounter for orthopedic aftercare following surgical amputation: Secondary | ICD-10-CM | POA: Diagnosis not present

## 2017-03-17 DIAGNOSIS — D649 Anemia, unspecified: Secondary | ICD-10-CM | POA: Diagnosis not present

## 2017-03-20 DIAGNOSIS — M4712 Other spondylosis with myelopathy, cervical region: Secondary | ICD-10-CM | POA: Diagnosis not present

## 2017-03-20 DIAGNOSIS — Z89612 Acquired absence of left leg above knee: Secondary | ICD-10-CM | POA: Diagnosis not present

## 2017-03-21 DIAGNOSIS — N183 Chronic kidney disease, stage 3 (moderate): Secondary | ICD-10-CM | POA: Diagnosis not present

## 2017-03-21 DIAGNOSIS — I739 Peripheral vascular disease, unspecified: Secondary | ICD-10-CM | POA: Diagnosis not present

## 2017-03-21 DIAGNOSIS — R69 Illness, unspecified: Secondary | ICD-10-CM | POA: Diagnosis not present

## 2017-03-21 DIAGNOSIS — Z89612 Acquired absence of left leg above knee: Secondary | ICD-10-CM | POA: Diagnosis not present

## 2017-03-21 DIAGNOSIS — E784 Other hyperlipidemia: Secondary | ICD-10-CM | POA: Diagnosis not present

## 2017-03-22 DIAGNOSIS — M810 Age-related osteoporosis without current pathological fracture: Secondary | ICD-10-CM | POA: Diagnosis not present

## 2017-03-22 DIAGNOSIS — N183 Chronic kidney disease, stage 3 (moderate): Secondary | ICD-10-CM | POA: Diagnosis not present

## 2017-03-22 DIAGNOSIS — Z4781 Encounter for orthopedic aftercare following surgical amputation: Secondary | ICD-10-CM | POA: Diagnosis not present

## 2017-03-22 DIAGNOSIS — Z7901 Long term (current) use of anticoagulants: Secondary | ICD-10-CM | POA: Diagnosis not present

## 2017-03-22 DIAGNOSIS — D649 Anemia, unspecified: Secondary | ICD-10-CM | POA: Diagnosis not present

## 2017-03-22 DIAGNOSIS — I739 Peripheral vascular disease, unspecified: Secondary | ICD-10-CM | POA: Diagnosis not present

## 2017-03-22 DIAGNOSIS — I129 Hypertensive chronic kidney disease with stage 1 through stage 4 chronic kidney disease, or unspecified chronic kidney disease: Secondary | ICD-10-CM | POA: Diagnosis not present

## 2017-03-22 DIAGNOSIS — M5000 Cervical disc disorder with myelopathy, unspecified cervical region: Secondary | ICD-10-CM | POA: Diagnosis not present

## 2017-03-22 DIAGNOSIS — R69 Illness, unspecified: Secondary | ICD-10-CM | POA: Diagnosis not present

## 2017-03-22 DIAGNOSIS — E785 Hyperlipidemia, unspecified: Secondary | ICD-10-CM | POA: Diagnosis not present

## 2017-03-23 DIAGNOSIS — N183 Chronic kidney disease, stage 3 (moderate): Secondary | ICD-10-CM | POA: Diagnosis not present

## 2017-03-23 DIAGNOSIS — E785 Hyperlipidemia, unspecified: Secondary | ICD-10-CM | POA: Diagnosis not present

## 2017-03-23 DIAGNOSIS — Z7901 Long term (current) use of anticoagulants: Secondary | ICD-10-CM | POA: Diagnosis not present

## 2017-03-23 DIAGNOSIS — D649 Anemia, unspecified: Secondary | ICD-10-CM | POA: Diagnosis not present

## 2017-03-23 DIAGNOSIS — I739 Peripheral vascular disease, unspecified: Secondary | ICD-10-CM | POA: Diagnosis not present

## 2017-03-23 DIAGNOSIS — M5000 Cervical disc disorder with myelopathy, unspecified cervical region: Secondary | ICD-10-CM | POA: Diagnosis not present

## 2017-03-23 DIAGNOSIS — Z4781 Encounter for orthopedic aftercare following surgical amputation: Secondary | ICD-10-CM | POA: Diagnosis not present

## 2017-03-23 DIAGNOSIS — M810 Age-related osteoporosis without current pathological fracture: Secondary | ICD-10-CM | POA: Diagnosis not present

## 2017-03-23 DIAGNOSIS — R69 Illness, unspecified: Secondary | ICD-10-CM | POA: Diagnosis not present

## 2017-03-23 DIAGNOSIS — I129 Hypertensive chronic kidney disease with stage 1 through stage 4 chronic kidney disease, or unspecified chronic kidney disease: Secondary | ICD-10-CM | POA: Diagnosis not present

## 2017-03-30 DIAGNOSIS — R69 Illness, unspecified: Secondary | ICD-10-CM | POA: Diagnosis not present

## 2017-03-31 DIAGNOSIS — Z7901 Long term (current) use of anticoagulants: Secondary | ICD-10-CM | POA: Diagnosis not present

## 2017-03-31 DIAGNOSIS — I739 Peripheral vascular disease, unspecified: Secondary | ICD-10-CM | POA: Diagnosis not present

## 2017-03-31 DIAGNOSIS — Z7902 Long term (current) use of antithrombotics/antiplatelets: Secondary | ICD-10-CM | POA: Diagnosis not present

## 2017-03-31 DIAGNOSIS — D649 Anemia, unspecified: Secondary | ICD-10-CM | POA: Diagnosis not present

## 2017-03-31 DIAGNOSIS — I129 Hypertensive chronic kidney disease with stage 1 through stage 4 chronic kidney disease, or unspecified chronic kidney disease: Secondary | ICD-10-CM | POA: Diagnosis not present

## 2017-03-31 DIAGNOSIS — R69 Illness, unspecified: Secondary | ICD-10-CM | POA: Diagnosis not present

## 2017-03-31 DIAGNOSIS — E785 Hyperlipidemia, unspecified: Secondary | ICD-10-CM | POA: Diagnosis not present

## 2017-03-31 DIAGNOSIS — M5000 Cervical disc disorder with myelopathy, unspecified cervical region: Secondary | ICD-10-CM | POA: Diagnosis not present

## 2017-03-31 DIAGNOSIS — M810 Age-related osteoporosis without current pathological fracture: Secondary | ICD-10-CM | POA: Diagnosis not present

## 2017-03-31 DIAGNOSIS — Z4781 Encounter for orthopedic aftercare following surgical amputation: Secondary | ICD-10-CM | POA: Diagnosis not present

## 2017-03-31 DIAGNOSIS — N183 Chronic kidney disease, stage 3 (moderate): Secondary | ICD-10-CM | POA: Diagnosis not present

## 2017-04-01 ENCOUNTER — Ambulatory Visit: Payer: Medicare HMO | Admitting: Physical Medicine & Rehabilitation

## 2017-04-04 DIAGNOSIS — K029 Dental caries, unspecified: Secondary | ICD-10-CM | POA: Diagnosis not present

## 2017-04-04 DIAGNOSIS — R69 Illness, unspecified: Secondary | ICD-10-CM | POA: Diagnosis not present

## 2017-04-04 DIAGNOSIS — E039 Hypothyroidism, unspecified: Secondary | ICD-10-CM | POA: Diagnosis not present

## 2017-04-04 DIAGNOSIS — Z9181 History of falling: Secondary | ICD-10-CM | POA: Diagnosis not present

## 2017-04-04 DIAGNOSIS — G609 Hereditary and idiopathic neuropathy, unspecified: Secondary | ICD-10-CM | POA: Diagnosis not present

## 2017-04-04 DIAGNOSIS — Z0000 Encounter for general adult medical examination without abnormal findings: Secondary | ICD-10-CM | POA: Diagnosis not present

## 2017-04-04 DIAGNOSIS — G546 Phantom limb syndrome with pain: Secondary | ICD-10-CM | POA: Diagnosis not present

## 2017-04-04 DIAGNOSIS — Z89612 Acquired absence of left leg above knee: Secondary | ICD-10-CM | POA: Diagnosis not present

## 2017-04-04 DIAGNOSIS — G4700 Insomnia, unspecified: Secondary | ICD-10-CM | POA: Diagnosis not present

## 2017-04-04 DIAGNOSIS — E7800 Pure hypercholesterolemia, unspecified: Secondary | ICD-10-CM | POA: Diagnosis not present

## 2017-04-04 DIAGNOSIS — Z993 Dependence on wheelchair: Secondary | ICD-10-CM | POA: Diagnosis not present

## 2017-04-05 ENCOUNTER — Ambulatory Visit (HOSPITAL_BASED_OUTPATIENT_CLINIC_OR_DEPARTMENT_OTHER): Payer: Medicare HMO | Admitting: Physical Medicine & Rehabilitation

## 2017-04-05 ENCOUNTER — Encounter: Payer: Self-pay | Admitting: Physical Medicine & Rehabilitation

## 2017-04-05 ENCOUNTER — Encounter: Payer: Medicare HMO | Attending: Physical Medicine & Rehabilitation

## 2017-04-05 VITALS — BP 105/67 | HR 93

## 2017-04-05 DIAGNOSIS — Z89612 Acquired absence of left leg above knee: Secondary | ICD-10-CM | POA: Diagnosis not present

## 2017-04-05 DIAGNOSIS — R609 Edema, unspecified: Secondary | ICD-10-CM | POA: Insufficient documentation

## 2017-04-05 DIAGNOSIS — F4310 Post-traumatic stress disorder, unspecified: Secondary | ICD-10-CM | POA: Diagnosis not present

## 2017-04-05 DIAGNOSIS — Z86718 Personal history of other venous thrombosis and embolism: Secondary | ICD-10-CM | POA: Diagnosis not present

## 2017-04-05 DIAGNOSIS — G546 Phantom limb syndrome with pain: Secondary | ICD-10-CM | POA: Diagnosis not present

## 2017-04-05 DIAGNOSIS — F17200 Nicotine dependence, unspecified, uncomplicated: Secondary | ICD-10-CM | POA: Diagnosis not present

## 2017-04-05 DIAGNOSIS — F329 Major depressive disorder, single episode, unspecified: Secondary | ICD-10-CM | POA: Insufficient documentation

## 2017-04-05 DIAGNOSIS — F419 Anxiety disorder, unspecified: Secondary | ICD-10-CM | POA: Insufficient documentation

## 2017-04-05 DIAGNOSIS — N183 Chronic kidney disease, stage 3 (moderate): Secondary | ICD-10-CM | POA: Insufficient documentation

## 2017-04-05 DIAGNOSIS — M180 Bilateral primary osteoarthritis of first carpometacarpal joints: Secondary | ICD-10-CM | POA: Insufficient documentation

## 2017-04-05 DIAGNOSIS — S78112A Complete traumatic amputation at level between left hip and knee, initial encounter: Secondary | ICD-10-CM

## 2017-04-05 DIAGNOSIS — I129 Hypertensive chronic kidney disease with stage 1 through stage 4 chronic kidney disease, or unspecified chronic kidney disease: Secondary | ICD-10-CM | POA: Diagnosis not present

## 2017-04-05 DIAGNOSIS — E785 Hyperlipidemia, unspecified: Secondary | ICD-10-CM | POA: Diagnosis not present

## 2017-04-05 DIAGNOSIS — Z79899 Other long term (current) drug therapy: Secondary | ICD-10-CM | POA: Insufficient documentation

## 2017-04-05 DIAGNOSIS — M79605 Pain in left leg: Secondary | ICD-10-CM | POA: Insufficient documentation

## 2017-04-05 DIAGNOSIS — E039 Hypothyroidism, unspecified: Secondary | ICD-10-CM | POA: Insufficient documentation

## 2017-04-05 DIAGNOSIS — R69 Illness, unspecified: Secondary | ICD-10-CM | POA: Diagnosis not present

## 2017-04-05 MED ORDER — TRAMADOL HCL 50 MG PO TABS: 50.0000 mg | ORAL_TABLET | Freq: Every day | ORAL | 5 refills | Status: DC

## 2017-04-05 MED ORDER — GABAPENTIN 400 MG PO CAPS: 400.0000 mg | ORAL_CAPSULE | Freq: Three times a day (TID) | ORAL | 5 refills | Status: DC

## 2017-04-05 NOTE — Patient Instructions (Signed)
Gabapentin is for phantom pain Tramadol is for stump pain 6. Month's supply of each If you need more after the supply runs out, she will need to see your family doctor

## 2017-04-05 NOTE — Progress Notes (Signed)
Subjective:    Patient ID: Alison Lynch, female    DOB: 04/23/1961, 56 y.o.   MRN: [id number]  HPI Left AKA healing ok, still has phantom pain and some phantom sensation.  Gabapentin increase was helpful. Larey SeatFell while trying to stand on left  Amb with walker with PT Using tramadol for stump pain  Didn't see psych for anxiety transportation an issue  Scheduled to see [facility] Friday including arterial doppler Pain Inventory Average Pain 8 Pain Right Now 4 My pain is sharp, burning, dull, stabbing, tingling and aching  In the last 24 hours, has pain interfered with the following? General activity 8 Relation with others 8 Enjoyment of life 8 What TIME of day is your pain at its worst? evening Sleep (in general) Poor  Pain is worse with: sitting Pain improves with: rest Relief from Meds: .  Mobility use a walker ability to climb steps?  no do you drive?  no use a wheelchair needs help with transfers  Function disabled: date disabled . Do you have any goals in this area?  yes  Neuro/Psych tingling trouble walking spasms confusion depression anxiety  Prior Studies Any changes since last visit?  no  Physicians involved in your care Any changes since last visit?  no   No family history on file. Social History   Social History  . Marital status: Married    Spouse name: N/A  . Number of children: N/A  . Years of education: N/A   Social History Main Topics  . Smoking status: Current Every Day Smoker  . Smokeless tobacco: Never Used  . Alcohol use No  . Drug use: No  . Sexual activity: Not on file   Other Topics Concern  . Not on file   Social History Narrative  . No narrative on file   Past Surgical History:  Procedure Laterality Date  . AMPUTATION Left 01/09/2017   Procedure: AMPUTATION ABOVE KNEE;  Surgeon: Maeola HarmanBrandon Christopher Cain, MD;  [facility];  Service: Vascular;  Laterality: Left;  . AORTOGRAM Left 01/08/2017   Procedure: Ultrasound Guided  Cannulation Left Common Femoral Artery; Aortagram;  Surgeon: Maeola HarmanBrandon Christopher Cain, MD;  [facility];  Service: Vascular;  Laterality: Left;  . ARTERY EXPLORATION Left 01/08/2017   Procedure: Left Common Femoral Artery Exploration;  Surgeon: Maeola HarmanBrandon Christopher Cain, MD;  [facility];  Service: Vascular;  Laterality: Left;  . EMBOLECTOMY Left 01/08/2017   Procedure: Left Lower Extremity Thromboembolectomy;  Surgeon: Maeola HarmanBrandon Christopher Cain, MD;  [facility];  Service: Vascular;  Laterality: Left;  . ENDARTERECTOMY POPLITEAL Left 01/08/2017   Procedure: Left Popliteal Endarterectomy;  Surgeon: Maeola HarmanBrandon Christopher Cain, MD;  [facility];  Service: Vascular;  Laterality: Left;  . INSERTION OF ILIAC STENT Bilateral 01/08/2017   Procedure: Bilateral Common Iliac Stent and Left Popliteal Artery Stent;  Surgeon: Maeola HarmanBrandon Christopher Cain, MD;  [facility];  Service: Vascular;  Laterality: Bilateral;  . LOWER EXTREMITY ANGIOGRAM Bilateral 01/08/2017   Procedure: Bilateral Lower Extremity Angiogram;  Surgeon: Maeola HarmanBrandon Christopher Cain, MD;  [facility];  Service: Vascular;  Laterality: Bilateral;  . PATCH ANGIOPLASTY  01/08/2017   Procedure: Patch Angioplasty Left Popliteal Artery ;  Surgeon: Maeola HarmanBrandon Christopher Cain, MD;  [facility];  Service: Vascular;;   Past Medical History:  Diagnosis Date  . Cervical myelopathy (HCC)   . CKD (chronic kidney disease) stage 3, GFR 30-59 ml/min   . Depression   . Dysphagia   . Hx of migraine  headaches   . Hyperlipidemia   . Hypertension   . Hypothyroid   . Osteoporosis   . Panic attacks   . PTSD (post-traumatic stress disorder)    There were no vitals taken for this visit.  Opioid Risk Score:   Fall Risk Score:  `1  Depression screen PHQ 2/9  Depression screen PHQ 2/9 02/18/2017  Decreased Interest 0  Down, Depressed, Hopeless 0  PHQ - 2 Score 0  Altered sleeping 3  Tired, decreased energy 1  Change in appetite 0  Feeling  bad or failure about yourself  1  Trouble concentrating 0  Moving slowly or fidgety/restless 0  Suicidal thoughts 0  PHQ-9 Score 5  Difficult doing work/chores Not difficult at all    Review of Systems  Constitutional: Positive for diaphoresis and unexpected weight change.  HENT: Negative.   Eyes: Negative.   Respiratory: Negative.   Cardiovascular: Negative.   Gastrointestinal: Negative.   Endocrine: Negative.   Genitourinary: Negative.   Musculoskeletal: Negative.   Skin: Negative.   Allergic/Immunologic: Negative.   Neurological: Negative.   Hematological: Bruises/bleeds easily.  Psychiatric/Behavioral: Negative.   All other systems reviewed and are negative.      Objective:   Physical Exam  Constitutional: She appears well-developed and well-nourished.  Skin: Skin is warm and dry.  Psychiatric: She has a normal mood and affect.  Nursing note and vitals reviewed.  Stump healing well non tender , No drainage, no erythema, edema is mild Right foot clean and dry Motor strength is 5/5 bilateral deltoid by stress or grip as well as right hip flexor, knee extensor, ankle dorsiflexor     Assessment & Plan:  1. Status post left AKA with phantom limb pain which has persisted. We'll continue gabapentin 400 mg in the morning and 800 daily at bedtime Still has some mild residual and occasional stump pain. We'll continue tramadol 50 mg per day as needed. Have given her 6 month supply. Follow up with physical medicine and rehabilitation on an as-needed basis. If she needs to stay on these medications on a more chronic basis. We could either see her back or she can ask her primary care physician to prescribe  Patient will follow up with vascular surgery for repeat arterial Dopplers as well as stump check, they will also order the prosthetic

## 2017-04-06 DIAGNOSIS — R69 Illness, unspecified: Secondary | ICD-10-CM | POA: Diagnosis not present

## 2017-04-06 DIAGNOSIS — Z4781 Encounter for orthopedic aftercare following surgical amputation: Secondary | ICD-10-CM | POA: Diagnosis not present

## 2017-04-06 DIAGNOSIS — N183 Chronic kidney disease, stage 3 (moderate): Secondary | ICD-10-CM | POA: Diagnosis not present

## 2017-04-06 DIAGNOSIS — M810 Age-related osteoporosis without current pathological fracture: Secondary | ICD-10-CM | POA: Diagnosis not present

## 2017-04-06 DIAGNOSIS — D649 Anemia, unspecified: Secondary | ICD-10-CM | POA: Diagnosis not present

## 2017-04-06 DIAGNOSIS — M5000 Cervical disc disorder with myelopathy, unspecified cervical region: Secondary | ICD-10-CM | POA: Diagnosis not present

## 2017-04-06 DIAGNOSIS — I739 Peripheral vascular disease, unspecified: Secondary | ICD-10-CM | POA: Diagnosis not present

## 2017-04-06 DIAGNOSIS — I129 Hypertensive chronic kidney disease with stage 1 through stage 4 chronic kidney disease, or unspecified chronic kidney disease: Secondary | ICD-10-CM | POA: Diagnosis not present

## 2017-04-06 DIAGNOSIS — Z7902 Long term (current) use of antithrombotics/antiplatelets: Secondary | ICD-10-CM | POA: Diagnosis not present

## 2017-04-06 DIAGNOSIS — E785 Hyperlipidemia, unspecified: Secondary | ICD-10-CM | POA: Diagnosis not present

## 2017-04-07 ENCOUNTER — Ambulatory Visit: Payer: Medicare HMO | Admitting: Physical Medicine & Rehabilitation

## 2017-04-08 DIAGNOSIS — I129 Hypertensive chronic kidney disease with stage 1 through stage 4 chronic kidney disease, or unspecified chronic kidney disease: Secondary | ICD-10-CM | POA: Diagnosis not present

## 2017-04-08 DIAGNOSIS — M810 Age-related osteoporosis without current pathological fracture: Secondary | ICD-10-CM | POA: Diagnosis not present

## 2017-04-08 DIAGNOSIS — E785 Hyperlipidemia, unspecified: Secondary | ICD-10-CM | POA: Diagnosis not present

## 2017-04-08 DIAGNOSIS — D649 Anemia, unspecified: Secondary | ICD-10-CM | POA: Diagnosis not present

## 2017-04-08 DIAGNOSIS — M5000 Cervical disc disorder with myelopathy, unspecified cervical region: Secondary | ICD-10-CM | POA: Diagnosis not present

## 2017-04-08 DIAGNOSIS — I739 Peripheral vascular disease, unspecified: Secondary | ICD-10-CM | POA: Diagnosis not present

## 2017-04-08 DIAGNOSIS — Z4781 Encounter for orthopedic aftercare following surgical amputation: Secondary | ICD-10-CM | POA: Diagnosis not present

## 2017-04-08 DIAGNOSIS — N183 Chronic kidney disease, stage 3 (moderate): Secondary | ICD-10-CM | POA: Diagnosis not present

## 2017-04-08 DIAGNOSIS — Z7902 Long term (current) use of antithrombotics/antiplatelets: Secondary | ICD-10-CM | POA: Diagnosis not present

## 2017-04-08 DIAGNOSIS — R69 Illness, unspecified: Secondary | ICD-10-CM | POA: Diagnosis not present

## 2017-04-12 DIAGNOSIS — I739 Peripheral vascular disease, unspecified: Secondary | ICD-10-CM | POA: Diagnosis not present

## 2017-04-12 DIAGNOSIS — R69 Illness, unspecified: Secondary | ICD-10-CM | POA: Diagnosis not present

## 2017-04-12 DIAGNOSIS — E785 Hyperlipidemia, unspecified: Secondary | ICD-10-CM | POA: Diagnosis not present

## 2017-04-12 DIAGNOSIS — Z4781 Encounter for orthopedic aftercare following surgical amputation: Secondary | ICD-10-CM | POA: Diagnosis not present

## 2017-04-12 DIAGNOSIS — Z7902 Long term (current) use of antithrombotics/antiplatelets: Secondary | ICD-10-CM | POA: Diagnosis not present

## 2017-04-12 DIAGNOSIS — I129 Hypertensive chronic kidney disease with stage 1 through stage 4 chronic kidney disease, or unspecified chronic kidney disease: Secondary | ICD-10-CM | POA: Diagnosis not present

## 2017-04-12 DIAGNOSIS — N183 Chronic kidney disease, stage 3 (moderate): Secondary | ICD-10-CM | POA: Diagnosis not present

## 2017-04-12 DIAGNOSIS — D649 Anemia, unspecified: Secondary | ICD-10-CM | POA: Diagnosis not present

## 2017-04-12 DIAGNOSIS — M810 Age-related osteoporosis without current pathological fracture: Secondary | ICD-10-CM | POA: Diagnosis not present

## 2017-04-12 DIAGNOSIS — M5000 Cervical disc disorder with myelopathy, unspecified cervical region: Secondary | ICD-10-CM | POA: Diagnosis not present

## 2017-04-14 DIAGNOSIS — Z7902 Long term (current) use of antithrombotics/antiplatelets: Secondary | ICD-10-CM | POA: Diagnosis not present

## 2017-04-14 DIAGNOSIS — R69 Illness, unspecified: Secondary | ICD-10-CM | POA: Diagnosis not present

## 2017-04-14 DIAGNOSIS — Z4781 Encounter for orthopedic aftercare following surgical amputation: Secondary | ICD-10-CM | POA: Diagnosis not present

## 2017-04-14 DIAGNOSIS — E785 Hyperlipidemia, unspecified: Secondary | ICD-10-CM | POA: Diagnosis not present

## 2017-04-14 DIAGNOSIS — I739 Peripheral vascular disease, unspecified: Secondary | ICD-10-CM | POA: Diagnosis not present

## 2017-04-14 DIAGNOSIS — M5000 Cervical disc disorder with myelopathy, unspecified cervical region: Secondary | ICD-10-CM | POA: Diagnosis not present

## 2017-04-14 DIAGNOSIS — I129 Hypertensive chronic kidney disease with stage 1 through stage 4 chronic kidney disease, or unspecified chronic kidney disease: Secondary | ICD-10-CM | POA: Diagnosis not present

## 2017-04-14 DIAGNOSIS — N183 Chronic kidney disease, stage 3 (moderate): Secondary | ICD-10-CM | POA: Diagnosis not present

## 2017-04-14 DIAGNOSIS — D649 Anemia, unspecified: Secondary | ICD-10-CM | POA: Diagnosis not present

## 2017-04-14 DIAGNOSIS — M810 Age-related osteoporosis without current pathological fracture: Secondary | ICD-10-CM | POA: Diagnosis not present

## 2017-04-15 ENCOUNTER — Encounter: Payer: Self-pay | Admitting: Vascular Surgery

## 2017-04-18 DIAGNOSIS — R69 Illness, unspecified: Secondary | ICD-10-CM | POA: Diagnosis not present

## 2017-04-18 DIAGNOSIS — E039 Hypothyroidism, unspecified: Secondary | ICD-10-CM | POA: Diagnosis not present

## 2017-04-18 DIAGNOSIS — I739 Peripheral vascular disease, unspecified: Secondary | ICD-10-CM | POA: Diagnosis not present

## 2017-04-18 DIAGNOSIS — Z89612 Acquired absence of left leg above knee: Secondary | ICD-10-CM | POA: Diagnosis not present

## 2017-04-18 DIAGNOSIS — N183 Chronic kidney disease, stage 3 (moderate): Secondary | ICD-10-CM | POA: Diagnosis not present

## 2017-04-18 DIAGNOSIS — E785 Hyperlipidemia, unspecified: Secondary | ICD-10-CM | POA: Diagnosis not present

## 2017-04-19 DIAGNOSIS — Z89612 Acquired absence of left leg above knee: Secondary | ICD-10-CM | POA: Diagnosis not present

## 2017-04-19 DIAGNOSIS — R69 Illness, unspecified: Secondary | ICD-10-CM | POA: Diagnosis not present

## 2017-04-19 DIAGNOSIS — I739 Peripheral vascular disease, unspecified: Secondary | ICD-10-CM | POA: Diagnosis not present

## 2017-04-19 DIAGNOSIS — Z7902 Long term (current) use of antithrombotics/antiplatelets: Secondary | ICD-10-CM | POA: Diagnosis not present

## 2017-04-19 DIAGNOSIS — M810 Age-related osteoporosis without current pathological fracture: Secondary | ICD-10-CM | POA: Diagnosis not present

## 2017-04-19 DIAGNOSIS — D649 Anemia, unspecified: Secondary | ICD-10-CM | POA: Diagnosis not present

## 2017-04-19 DIAGNOSIS — N183 Chronic kidney disease, stage 3 (moderate): Secondary | ICD-10-CM | POA: Diagnosis not present

## 2017-04-19 DIAGNOSIS — E785 Hyperlipidemia, unspecified: Secondary | ICD-10-CM | POA: Diagnosis not present

## 2017-04-19 DIAGNOSIS — Z4781 Encounter for orthopedic aftercare following surgical amputation: Secondary | ICD-10-CM | POA: Diagnosis not present

## 2017-04-19 DIAGNOSIS — M5000 Cervical disc disorder with myelopathy, unspecified cervical region: Secondary | ICD-10-CM | POA: Diagnosis not present

## 2017-04-19 DIAGNOSIS — M4712 Other spondylosis with myelopathy, cervical region: Secondary | ICD-10-CM | POA: Diagnosis not present

## 2017-04-19 DIAGNOSIS — I129 Hypertensive chronic kidney disease with stage 1 through stage 4 chronic kidney disease, or unspecified chronic kidney disease: Secondary | ICD-10-CM | POA: Diagnosis not present

## 2017-04-22 ENCOUNTER — Encounter: Payer: Self-pay | Admitting: Vascular Surgery

## 2017-04-22 ENCOUNTER — Ambulatory Visit (INDEPENDENT_AMBULATORY_CARE_PROVIDER_SITE_OTHER)
Admission: RE | Admit: 2017-04-22 | Discharge: 2017-04-22 | Disposition: A | Discharge status: Home / Self Care | Payer: Medicare HMO | Source: Ambulatory Visit | Attending: Vascular Surgery | Admitting: Vascular Surgery

## 2017-04-22 ENCOUNTER — Ambulatory Visit (HOSPITAL_COMMUNITY)
Admission: RE | Admit: 2017-04-22 | Discharge: 2017-04-22 | Disposition: A | Discharge status: Home / Self Care | Payer: Medicare HMO | Source: Ambulatory Visit | Attending: Vascular Surgery | Admitting: Vascular Surgery

## 2017-04-22 ENCOUNTER — Ambulatory Visit (INDEPENDENT_AMBULATORY_CARE_PROVIDER_SITE_OTHER): Payer: Medicare HMO | Admitting: Vascular Surgery

## 2017-04-22 ENCOUNTER — Encounter: Payer: Self-pay | Admitting: *Deleted

## 2017-04-22 ENCOUNTER — Other Ambulatory Visit: Payer: Self-pay | Admitting: *Deleted

## 2017-04-22 VITALS — BP 84/56 | HR 98 | Temp 98.7°F | Resp 18 | Ht 60.0 in | Wt 114.0 lb

## 2017-04-22 DIAGNOSIS — Z95828 Presence of other vascular implants and grafts: Secondary | ICD-10-CM | POA: Diagnosis not present

## 2017-04-22 DIAGNOSIS — Z89612 Acquired absence of left leg above knee: Secondary | ICD-10-CM | POA: Diagnosis not present

## 2017-04-22 DIAGNOSIS — I739 Peripheral vascular disease, unspecified: Secondary | ICD-10-CM | POA: Diagnosis not present

## 2017-04-22 NOTE — Progress Notes (Signed)
Patient ID: Alison Lynch, female   DOB: 10/07/1961, 56 y.o.   MRN: [id number]  Reason for Consult: Re-evaluation (2 month f/u )   Referred by Hal MoralesGunter, Tara G, NP  Subjective:     HPI:  Alison Lynch is a 56 y.o. female is here for follow-up of her previous bilateral iliac artery stenting as well as left above-knee amputation for acute left lower extremity ischemia. She is progressing well and is now healed her bony of dictation the left. She does complain of coolness to her right foot. She has not had any wounds or rest pain to the right foot. She is hopeful to get a prosthesis soon. She does continue to smoke unfortunately does not take any antiplatelet or anticoagulants at this time. She is on statin drug. She does not have issues related to today's visit.  Past Medical History:  Diagnosis Date  . Cervical myelopathy (HCC)   . CKD (chronic kidney disease) stage 3, GFR 30-59 ml/min   . Depression   . Dysphagia   . Hx of migraine headaches   . Hyperlipidemia   . Hypertension   . Hypothyroid   . Osteoporosis   . Panic attacks   . PTSD (post-traumatic stress disorder)    History reviewed. No pertinent family history. Past Surgical History:  Procedure Laterality Date  . AMPUTATION Left 01/09/2017   Procedure: AMPUTATION ABOVE KNEE;  Surgeon: Maeola HarmanBrandon Christopher Alara Daniel, MD;  [facility];  Service: Vascular;  Laterality: Left;  . AORTOGRAM Left 01/08/2017   Procedure: Ultrasound Guided Cannulation Left Common Femoral Artery; Aortagram;  Surgeon: Maeola HarmanBrandon Christopher Song Myre, MD;  [facility];  Service: Vascular;  Laterality: Left;  . ARTERY EXPLORATION Left 01/08/2017   Procedure: Left Common Femoral Artery Exploration;  Surgeon: Maeola HarmanBrandon Christopher Roneka Gilpin, MD;  [facility];  Service: Vascular;  Laterality: Left;  . EMBOLECTOMY Left 01/08/2017   Procedure: Left Lower Extremity Thromboembolectomy;  Surgeon: Maeola HarmanBrandon Christopher Alaster Asfaw, MD;  [facility];  Service: Vascular;  Laterality:  Left;  . ENDARTERECTOMY POPLITEAL Left 01/08/2017   Procedure: Left Popliteal Endarterectomy;  Surgeon: Maeola HarmanBrandon Christopher Buel Molder, MD;  [facility];  Service: Vascular;  Laterality: Left;  . INSERTION OF ILIAC STENT Bilateral 01/08/2017   Procedure: Bilateral Common Iliac Stent and Left Popliteal Artery Stent;  Surgeon: Maeola HarmanBrandon Christopher Sante Biedermann, MD;  [facility];  Service: Vascular;  Laterality: Bilateral;  . LOWER EXTREMITY ANGIOGRAM Bilateral 01/08/2017   Procedure: Bilateral Lower Extremity Angiogram;  Surgeon: Maeola HarmanBrandon Christopher Haze Antillon, MD;  [facility];  Service: Vascular;  Laterality: Bilateral;  . PATCH ANGIOPLASTY  01/08/2017   Procedure: Patch Angioplasty Left Popliteal Artery ;  Surgeon: Maeola HarmanBrandon Christopher Maisen Schmit, MD;  [facility];  Service: Vascular;;    Short Social History:  Social History  Substance Use Topics  . Smoking status: Current Every Day Smoker  . Smokeless tobacco: Never Used  . Alcohol use No    Allergies  Allergen Reactions  . Clindamycin/Lincomycin Rash    Current Outpatient Prescriptions  Medication Sig Dispense Refill  . atorvastatin (LIPITOR) 80 MG tablet Take 1 tablet (80 mg total) by mouth daily. 30 tablet 0  . busPIRone (BUSPAR) 10 MG tablet Take 10-15 mg by mouth See admin instructions. Take 1 and 1/2 tablets every morning and take 1 tablet at bedtime    . gabapentin (NEURONTIN) 400 MG capsule Take 1 capsule (400 mg total) by mouth 3 (three) times daily. 90 capsule 5  . mirtazapine (REMERON) 15 MG tablet  Take 15 mg by mouth at bedtime.    . nicotine (NICODERM CQ - DOSED IN MG/24 HR) 7 mg/24hr patch Place 1 patch (7 mg total) onto the skin daily. 28 patch 0  . sertraline (ZOLOFT) 50 MG tablet Take 1 tablet (50 mg total) by mouth daily. 30 tablet 0  . traMADol (ULTRAM) 50 MG tablet Take 1 tablet (50 mg total) by mouth daily. 30 tablet 5  . ALPRAZolam (XANAX) 0.25 MG tablet Take 1 tablet (0.25 mg total) by mouth 3 (three) times daily as needed  for anxiety. (Patient not taking: Reported on 04/22/2017) 30 tablet 0  . clopidogrel (PLAVIX) 75 MG tablet Take 1 tablet (75 mg total) by mouth daily. 30 tablet 11  . guaiFENesin (MUCINEX) 600 MG 12 hr tablet Take 1 tablet (600 mg total) by mouth 2 (two) times daily. (Patient not taking: Reported on 04/22/2017) 30 tablet 0  . HYDROcodone-acetaminophen (NORCO/VICODIN) 5-325 MG tablet Take 1-2 tablets by mouth every 4 (four) hours as needed for severe pain. (Patient not taking: Reported on 04/22/2017) 30 tablet 0  . levothyroxine (SYNTHROID, LEVOTHROID) 50 MCG tablet Take 1 tablet (50 mcg total) by mouth daily before breakfast. (Patient not taking: Reported on 04/22/2017) 30 tablet 1  . methocarbamol (ROBAXIN) 750 MG tablet Take 1 tablet (750 mg total) by mouth every 8 (eight) hours as needed for muscle spasms. (Patient not taking: Reported on 04/22/2017) 60 tablet 0  . pantoprazole (PROTONIX) 40 MG tablet Take 1 tablet (40 mg total) by mouth daily. (Patient not taking: Reported on 04/22/2017) 30 tablet 0   No current facility-administered medications for this visit.     Review of Systems  Constitutional: Positive for chills, diaphoresis and fever.  HENT: HENT negative.  Eyes: Eyes negative.  Respiratory: Respiratory negative.  Cardiovascular: Cardiovascular negative.  GI: Gastrointestinal negative.  Musculoskeletal: Positive for leg pain.  Neurological: Neurological negative. Hematologic: Hematologic/lymphatic negative.  Psychiatric: Positive for depressed mood.        Objective:  Objective   Vitals:   04/22/17 1039  BP: (!) 84/56  Pulse: 98  Resp: 18  Temp: 98.7 F (37.1 C)  TempSrc: Oral  SpO2: 98%  Weight: 114 lb (51.7 kg)  Height: 5' (1.524 m)   Body mass index is 22.26 kg/m.  Physical Exam  Constitutional: She appears well-developed.  HENT:  Head: Normocephalic.  Eyes: Pupils are equal, round, and reactive to light.  Neck: Normal range of motion.  Cardiovascular: Normal  rate.   Venous signals in right foot  Pulmonary/Chest: Effort normal.  Abdominal: Soft. She exhibits no mass.  Musculoskeletal:  Well healed left aka  Neurological: She is alert.  Skin: Skin is warm.  Psychiatric: She has a normal mood and affect. Her behavior is normal. Judgment and thought content normal.    Data: I have independently reviewed her duplex of her bilateral common iliac artery stents. The right-sided stent demonstrates no flow monophasic common femoral artery flow system with occlusion. Her ABI is absent on the right.     Assessment/Plan:    56yo wf with history of kissing iliac stents with left lower extremity embolectomy that resulted in left above-the-knee amputation that is well healed now. She does have coolness occasional pain in the right foot has evidence of an occluded stents and very minimal blood flow to her foot. From the standpoint we will proceed with aortogram possible redo bilateral iliac artery stenting possible right lower extremity intervention. I discussed with her the possible need for  open abdominal surgery and she is hoping to avoid this. We discussed the risk and benefits and she is willing to proceed with aortogram possible intervention.     Maeola Harman MD [facility]

## 2017-04-25 DIAGNOSIS — R69 Illness, unspecified: Secondary | ICD-10-CM | POA: Diagnosis not present

## 2017-04-25 DIAGNOSIS — Z7902 Long term (current) use of antithrombotics/antiplatelets: Secondary | ICD-10-CM | POA: Diagnosis not present

## 2017-04-25 DIAGNOSIS — I129 Hypertensive chronic kidney disease with stage 1 through stage 4 chronic kidney disease, or unspecified chronic kidney disease: Secondary | ICD-10-CM | POA: Diagnosis not present

## 2017-04-25 DIAGNOSIS — N183 Chronic kidney disease, stage 3 (moderate): Secondary | ICD-10-CM | POA: Diagnosis not present

## 2017-04-25 DIAGNOSIS — M810 Age-related osteoporosis without current pathological fracture: Secondary | ICD-10-CM | POA: Diagnosis not present

## 2017-04-25 DIAGNOSIS — I739 Peripheral vascular disease, unspecified: Secondary | ICD-10-CM | POA: Diagnosis not present

## 2017-04-25 DIAGNOSIS — Z4781 Encounter for orthopedic aftercare following surgical amputation: Secondary | ICD-10-CM | POA: Diagnosis not present

## 2017-04-25 DIAGNOSIS — E785 Hyperlipidemia, unspecified: Secondary | ICD-10-CM | POA: Diagnosis not present

## 2017-04-25 DIAGNOSIS — M5000 Cervical disc disorder with myelopathy, unspecified cervical region: Secondary | ICD-10-CM | POA: Diagnosis not present

## 2017-04-25 DIAGNOSIS — D649 Anemia, unspecified: Secondary | ICD-10-CM | POA: Diagnosis not present

## 2017-04-27 ENCOUNTER — Other Ambulatory Visit: Payer: Self-pay | Admitting: *Deleted

## 2017-04-27 ENCOUNTER — Encounter (HOSPITAL_COMMUNITY): Payer: Self-pay | Admitting: Vascular Surgery

## 2017-04-27 ENCOUNTER — Ambulatory Visit (HOSPITAL_COMMUNITY)
Admission: RE | Disposition: A | Discharge status: Home / Self Care | Payer: Self-pay | Source: Ambulatory Visit | Attending: Vascular Surgery

## 2017-04-27 ENCOUNTER — Telehealth: Payer: Self-pay | Admitting: Vascular Surgery

## 2017-04-27 ENCOUNTER — Ambulatory Visit (HOSPITAL_COMMUNITY)
Admission: RE | Admit: 2017-04-27 | Discharge: 2017-04-27 | Disposition: A | Discharge status: Home / Self Care | Payer: Medicare HMO | Source: Ambulatory Visit | Attending: Vascular Surgery | Admitting: Vascular Surgery

## 2017-04-27 DIAGNOSIS — F4310 Post-traumatic stress disorder, unspecified: Secondary | ICD-10-CM | POA: Insufficient documentation

## 2017-04-27 DIAGNOSIS — N183 Chronic kidney disease, stage 3 (moderate): Secondary | ICD-10-CM | POA: Insufficient documentation

## 2017-04-27 DIAGNOSIS — Z89612 Acquired absence of left leg above knee: Secondary | ICD-10-CM | POA: Insufficient documentation

## 2017-04-27 DIAGNOSIS — M810 Age-related osteoporosis without current pathological fracture: Secondary | ICD-10-CM | POA: Diagnosis not present

## 2017-04-27 DIAGNOSIS — T82856A Stenosis of peripheral vascular stent, initial encounter: Secondary | ICD-10-CM | POA: Diagnosis not present

## 2017-04-27 DIAGNOSIS — I998 Other disorder of circulatory system: Secondary | ICD-10-CM | POA: Insufficient documentation

## 2017-04-27 DIAGNOSIS — Z01810 Encounter for preprocedural cardiovascular examination: Secondary | ICD-10-CM

## 2017-04-27 DIAGNOSIS — F410 Panic disorder [episodic paroxysmal anxiety] without agoraphobia: Secondary | ICD-10-CM | POA: Diagnosis not present

## 2017-04-27 DIAGNOSIS — Z7902 Long term (current) use of antithrombotics/antiplatelets: Secondary | ICD-10-CM | POA: Insufficient documentation

## 2017-04-27 DIAGNOSIS — R69 Illness, unspecified: Secondary | ICD-10-CM | POA: Diagnosis not present

## 2017-04-27 DIAGNOSIS — E039 Hypothyroidism, unspecified: Secondary | ICD-10-CM | POA: Diagnosis not present

## 2017-04-27 DIAGNOSIS — Z888 Allergy status to other drugs, medicaments and biological substances status: Secondary | ICD-10-CM | POA: Insufficient documentation

## 2017-04-27 DIAGNOSIS — Y713 Surgical instruments, materials and cardiovascular devices (including sutures) associated with adverse incidents: Secondary | ICD-10-CM | POA: Diagnosis not present

## 2017-04-27 DIAGNOSIS — I129 Hypertensive chronic kidney disease with stage 1 through stage 4 chronic kidney disease, or unspecified chronic kidney disease: Secondary | ICD-10-CM | POA: Insufficient documentation

## 2017-04-27 DIAGNOSIS — Z79899 Other long term (current) drug therapy: Secondary | ICD-10-CM | POA: Diagnosis not present

## 2017-04-27 DIAGNOSIS — E785 Hyperlipidemia, unspecified: Secondary | ICD-10-CM | POA: Diagnosis not present

## 2017-04-27 DIAGNOSIS — F329 Major depressive disorder, single episode, unspecified: Secondary | ICD-10-CM | POA: Insufficient documentation

## 2017-04-27 HISTORY — PX: ABDOMINAL AORTOGRAM W/LOWER EXTREMITY: CATH118223

## 2017-04-27 LAB — POCT I-STAT, CHEM 8
BUN: 16 mg/dL (ref 6–20)
CALCIUM ION: 1.16 mmol/L (ref 1.15–1.40)
CHLORIDE: 103 mmol/L (ref 101–111)
Creatinine, Ser: 1.1 mg/dL — ABNORMAL HIGH (ref 0.44–1.00)
Glucose, Bld: 96 mg/dL (ref 65–99)
HCT: 37 % (ref 36.0–46.0)
Hemoglobin: 12.6 g/dL (ref 12.0–15.0)
POTASSIUM: 4.6 mmol/L (ref 3.5–5.1)
Sodium: 139 mmol/L (ref 135–145)
TCO2: 31 mmol/L (ref 0–100)

## 2017-04-27 LAB — POCT ACTIVATED CLOTTING TIME: Activated Clotting Time: 175 seconds

## 2017-04-27 SURGERY — ABDOMINAL AORTOGRAM W/LOWER EXTREMITY
Anesthesia: LOCAL | Laterality: Right

## 2017-04-27 MED ORDER — FENTANYL CITRATE (PF) 100 MCG/2ML IJ SOLN
INTRAMUSCULAR | Status: AC
  Filled 2017-04-27: qty 2

## 2017-04-27 MED ORDER — HEPARIN SODIUM (PORCINE) 1000 UNIT/ML IJ SOLN
INTRAMUSCULAR | Status: AC
  Filled 2017-04-27: qty 1

## 2017-04-27 MED ORDER — HEPARIN SODIUM (PORCINE) 1000 UNIT/ML IJ SOLN
INTRAMUSCULAR | Status: DC | PRN
  Administered 2017-04-27: 4000 [IU] via INTRAVENOUS

## 2017-04-27 MED ORDER — MIDAZOLAM HCL 2 MG/2ML IJ SOLN
INTRAMUSCULAR | Status: AC
  Filled 2017-04-27: qty 2

## 2017-04-27 MED ORDER — MIDAZOLAM HCL 2 MG/2ML IJ SOLN
INTRAMUSCULAR | Status: DC | PRN
  Administered 2017-04-27: 0.5 mg via INTRAVENOUS

## 2017-04-27 MED ORDER — IODIXANOL 320 MG/ML IV SOLN
INTRAVENOUS | Status: DC | PRN
  Administered 2017-04-27: 65 mL via INTRA_ARTERIAL

## 2017-04-27 MED ORDER — HEPARIN (PORCINE) IN NACL 2-0.9 UNIT/ML-% IJ SOLN
INTRAMUSCULAR | Status: AC | PRN
  Administered 2017-04-27: 1000 mL

## 2017-04-27 MED ORDER — SODIUM CHLORIDE 0.9 % IV SOLN
INTRAVENOUS | Status: DC
  Administered 2017-04-27: 08:00:00 via INTRAVENOUS

## 2017-04-27 MED ORDER — LIDOCAINE HCL 1 % IJ SOLN
INTRAMUSCULAR | Status: AC
  Filled 2017-04-27: qty 20

## 2017-04-27 MED ORDER — SODIUM CHLORIDE 0.9 % IV SOLN: 1.0000 mL/kg/h | INTRAVENOUS | Status: DC

## 2017-04-27 MED ORDER — OXYCODONE-ACETAMINOPHEN 5-325 MG PO TABS
1.0000 | ORAL_TABLET | ORAL | Status: DC | PRN
  Administered 2017-04-27: 1 via ORAL

## 2017-04-27 MED ORDER — FENTANYL CITRATE (PF) 100 MCG/2ML IJ SOLN
INTRAMUSCULAR | Status: DC | PRN
Start: 2017-04-27 — End: 2017-04-27
  Administered 2017-04-27: 50 ug via INTRAVENOUS

## 2017-04-27 MED ORDER — OXYCODONE-ACETAMINOPHEN 5-325 MG PO TABS
ORAL_TABLET | ORAL | Status: AC
  Filled 2017-04-27: qty 1

## 2017-04-27 SURGICAL SUPPLY — 14 items
CATH ANGIO 5F BER2 65CM (CATHETERS) ×2 IMPLANT
CATH OMNI FLUSH 5F 65CM (CATHETERS) ×2 IMPLANT
COVER PRB 48X5XTLSCP FOLD TPE (BAG) ×1 IMPLANT
COVER PROBE 5X48 (BAG) ×1
DEVICE TORQUE .025-.038 (MISCELLANEOUS) ×2 IMPLANT
GUIDEWIRE ANGLED .035X150CM (WIRE) ×2 IMPLANT
KIT MICROINTRODUCER STIFF 5F (SHEATH) ×2 IMPLANT
KIT PV (KITS) ×2 IMPLANT
SHEATH PINNACLE 5F 10CM (SHEATH) ×2 IMPLANT
SYR MEDRAD MARK V 150ML (SYRINGE) ×2 IMPLANT
TRANSDUCER W/STOPCOCK (MISCELLANEOUS) ×2 IMPLANT
TRAY PV CATH (CUSTOM PROCEDURE TRAY) ×2 IMPLANT
TUBING CIL FLEX 10 FLL-RA (TUBING) ×2 IMPLANT
WIRE BENTSON .035X145CM (WIRE) ×2 IMPLANT

## 2017-04-27 NOTE — Discharge Instructions (Signed)
Angiogram, Care After °This sheet gives you information about how to care for yourself after your procedure. Your health care provider may also give you more specific instructions. If you have problems or questions, contact your health care provider. °What can I expect after the procedure? °After the procedure, it is common to have bruising and tenderness at the catheter insertion area. °Follow these instructions at home: °Insertion site care  °· Follow instructions from your health care provider about how to take care of your insertion site. Make sure you: °¨ Wash your hands with soap and water before you change your bandage (dressing). If soap and water are not available, use hand sanitizer. °¨ Change your dressing as told by your health care provider. °¨ Leave stitches (sutures), skin glue, or adhesive strips in place. These skin closures may need to stay in place for 2 weeks or longer. If adhesive strip edges start to loosen and curl up, you may trim the loose edges. Do not remove adhesive strips completely unless your health care provider tells you to do that. °· Do not take baths, swim, or use a hot tub until your health care provider approves. °· You may shower 24-48 hours after the procedure or as told by your health care provider. °¨ Gently wash the site with plain soap and water. °¨ Pat the area dry with a clean towel. °¨ Do not rub the site. This may cause bleeding. °· Do not apply powder or lotion to the site. Keep the site clean and dry. °· Check your insertion site every day for signs of infection. Check for: °¨ Redness, swelling, or pain. °¨ Fluid or blood. °¨ Warmth. °¨ Pus or a bad smell. °Activity  °· Rest as told by your health care provider, usually for 1-2 days. °· Do not lift anything that is heavier than 10 lbs. (4.5 kg) or as told by your health care provider. °· Do not drive for 24 hours if you were given a medicine to help you relax (sedative). °· Do not drive or use heavy machinery while  taking prescription pain medicine. °General instructions  °· Return to your normal activities as told by your health care provider, usually in about a week. Ask your health care provider what activities are safe for you. °· If the catheter site starts bleeding, lie flat and put pressure on the site. If the bleeding does not stop, get help right away. This is a medical emergency. °· Drink enough fluid to keep your urine clear or pale yellow. This helps flush the contrast dye from your body. °· Take over-the-counter and prescription medicines only as told by your health care provider. °· Keep all follow-up visits as told by your health care provider. This is important. °Contact a health care provider if: °· You have a fever or chills. °· You have redness, swelling, or pain around your insertion site. °· You have fluid or blood coming from your insertion site. °· The insertion site feels warm to the touch. °· You have pus or a bad smell coming from your insertion site. °· You have bruising around the insertion site. °· You notice blood collecting in the tissue around the catheter site (hematoma). The hematoma may be painful to the touch. °Get help right away if: °· You have severe pain at the catheter insertion area. °· The catheter insertion area swells very fast. °· The catheter insertion area is bleeding, and the bleeding does not stop when you hold steady pressure on   the area. °· The area near or just beyond the catheter insertion site becomes pale, cool, tingly, or numb. °These symptoms may represent a serious problem that is an emergency. Do not wait to see if the symptoms will go away. Get medical help right away. Call your local emergency services (911 in the [place] Do not drive yourself to the hospital. °Summary °· After the procedure, it is common to have bruising and tenderness at the catheter insertion area. °· After the procedure, it is important to rest and drink plenty of fluids. °· Do not take baths,  swim, or use a hot tub until your health care provider says it is okay to do so. You may shower 24-48 hours after the procedure or as told by your health care provider. °· If the catheter site starts bleeding, lie flat and put pressure on the site. If the bleeding does not stop, get help right away. This is a medical emergency. °This information is not intended to replace advice given to you by your health care provider. Make sure you discuss any questions you have with your health care provider. °Document Released: 06/03/2005 Document Revised: 10/20/2016 Document Reviewed: 10/20/2016 °Elsevier Interactive Patient Education © 2017 [employer] ° °

## 2017-04-27 NOTE — Op Note (Signed)
    Patient name: Alison Lynch MRN: [id number] DOB: 05/02/1961 Sex: female  04/27/2017 Pre-operative Diagnosis: critical right lower extremity ischemia Post-operative diagnosis:  Same Surgeon:  Apolinar JunesBrandon C. Randie Heinzain, MD Procedure Performed: 1.  US guided cannulation of right common femoral artery 2.  Aortogram with right lower extremity runoff 3.  Moderate sedation with fentanyl and versed for 24 minutes  Indications:  The 56 year old female history bilateral iliac artery stenting when she presented with cold legs. She is subsequently undergone left above-knee amputation now has a pulseless right lower extremity with rest pain. She is indicated for evaluation with possible redo stenting.  Findings: Bilateral iliac artery stents are occluded. We were able to get through the right and  perform aortogram which demonstrates patent renal arteries bilaterally as well as SMA. The bilateral hypogastric arteries are patent and there is no evidence of reconstitution of common femoral arteries. Right lower extremity angiogram shows was runoff below the knee the contrast washes out after that. Ultrasound demonstrates patency of the bilateral common femoral arteries. Patient will be scheduled for aortobifemoral bypass.   Procedure:  The patient was identified in the holding area and taken to room 8.  The patient was then placed supine on the table and prepped and draped in the usual sterile fashion.  A time out was called.  Ultrasound was used to evaluate the right common femoral artery.  It was patent .  A digital ultrasound image was acquired.  A micropuncture needle was used to access the right common femoral artery under ultrasound guidance.  An 018 wire was advanced without resistance and a micropuncture sheath was placed.  The 018 wire was removed and a benson wire was placed.  The micropuncture sheath was exchanged for a 5 french sheath. Using BER catheter Glidewire were able to traverse the occluded right iliac  artery stents and get into the true lumen of the aorta and perform aortogram with the above findings. We do not see reconstitution of the left common femoral artery but did confirm this with ultrasound. We then removed our Omni Flush catheter performed right lower extremity angiogram through our sheath which demonstrated runoff to at least the level of the knee but knowing she presented had a palpable pulse she probably has tibial arteries are patent as well. This patient will need aortobifemoral bypass following cardiac clearance.   Contrast: 65cc  Aodhan Scheidt C. Randie Heinzain, MD [facility] Office: [phone] Pager: [phone]

## 2017-04-27 NOTE — Telephone Encounter (Signed)
Sched cardio clearance 05/09/17 at 3:00 with Dr. Herbie BaltimoreHarding at the [facility] office. They will contact pt.

## 2017-04-27 NOTE — Progress Notes (Signed)
505fr sheath removed from right femoral artery. Manual pressure held 15 minutes. No bleeding and no complications. Post instructions given to the patient.

## 2017-04-27 NOTE — H&P (Signed)
   History and Physical Update  The patient was interviewed and re-examined.  The patient's previous History and Physical has been reviewed and is unchanged from recent office visit. Plan for aortogram with possible bilateral iliac artery stenting.    Alison Cronk C. Randie Heinzain, MD [facility] Office: [phone] Pager: [phone]  04/27/2017, 8:19 AM

## 2017-04-27 NOTE — Telephone Encounter (Signed)
-----   Message from Sharee PimpleMarilyn K McChesney, RN sent at 04/27/2017 11:14 AM EDT ----- Regarding: cardiac clearance asap Referral in EPIC, need asap  ----- Message ----- From: Maeola Harmanain, Brandon Christopher, MD Sent: 04/27/2017  10:38 AM To: [facility] Charge Pool  Alison Fergusonammy Cong [id number] 06/01/1961  04/27/2017 Pre-operative Diagnosis: critical right lower extremity ischemia  Surgeon:  Apolinar JunesBrandon C. Randie Heinzain, MD  Procedure Performed: 1.  US guided cannulation of right common femoral artery 2.  Aortogram with right lower extremity runoff 3.  Moderate sedation with fentanyl and versed for 24 minutes  She needs cardiac clearance and aortobifemoral bypass when I am back from vacation.

## 2017-04-28 MED FILL — Lidocaine HCl Local Inj 1%: INTRAMUSCULAR | Qty: 20 | Status: AC

## 2017-05-02 DIAGNOSIS — E781 Pure hyperglyceridemia: Secondary | ICD-10-CM | POA: Diagnosis not present

## 2017-05-02 DIAGNOSIS — N183 Chronic kidney disease, stage 3 (moderate): Secondary | ICD-10-CM | POA: Diagnosis not present

## 2017-05-02 NOTE — Anesthesia Postprocedure Evaluation (Signed)
Anesthesia Post Note  Patient: Alison Lynch  Procedure(s) Performed: Procedure(s) (LRB): LOWER EXTREMITY ANGIOGRAM; Exposure of Left Dorsal Pedialis (Left)     Anesthesia Post Evaluation  Last Vitals:  Vitals:   01/13/17 0552 01/13/17 1334  BP: 138/80 (!) 114/51  Pulse: 81 96  Resp: 20 17  Temp: 36.6 C 36.7 C    Last Pain:  Vitals:   01/13/17 1616  TempSrc:   PainSc: 7                  Batu Cassin S

## 2017-05-02 NOTE — Addendum Note (Signed)
Addendum  created 05/02/17 1105 by Loretha Ure, MD   Sign clinical note    

## 2017-05-02 NOTE — Anesthesia Postprocedure Evaluation (Signed)
Anesthesia Post Note  Patient: Alison Lynch  Procedure(s) Performed: Procedure(s) (LRB): Ultrasound Guided Cannulation Left Common Femoral Artery; Aortagram (Left) Bilateral Lower Extremity Angiogram (Bilateral) Bilateral Common Iliac Stent and Left Popliteal Artery Stent (Bilateral) Left Common Femoral Artery Exploration (Left) Left Lower Extremity Thromboembolectomy (Left) Left Popliteal Endarterectomy (Left) Patch Angioplasty Left Popliteal Artery      Anesthesia Post Evaluation  Last Vitals:  Vitals:   01/13/17 0552 01/13/17 1334  BP: 138/80 (!) 114/51  Pulse: 81 96  Resp: 20 17  Temp: 36.6 C 36.7 C    Last Pain:  Vitals:   01/13/17 1616  TempSrc:   PainSc: 7                  [name] S

## 2017-05-02 NOTE — Addendum Note (Signed)
Addendum  created 05/02/17 1105 by Kairav Russomanno, MD   Sign clinical note    

## 2017-05-05 DIAGNOSIS — R69 Illness, unspecified: Secondary | ICD-10-CM | POA: Diagnosis not present

## 2017-05-05 DIAGNOSIS — Z7902 Long term (current) use of antithrombotics/antiplatelets: Secondary | ICD-10-CM | POA: Diagnosis not present

## 2017-05-05 DIAGNOSIS — E785 Hyperlipidemia, unspecified: Secondary | ICD-10-CM | POA: Diagnosis not present

## 2017-05-05 DIAGNOSIS — M5000 Cervical disc disorder with myelopathy, unspecified cervical region: Secondary | ICD-10-CM | POA: Diagnosis not present

## 2017-05-05 DIAGNOSIS — D649 Anemia, unspecified: Secondary | ICD-10-CM | POA: Diagnosis not present

## 2017-05-05 DIAGNOSIS — I129 Hypertensive chronic kidney disease with stage 1 through stage 4 chronic kidney disease, or unspecified chronic kidney disease: Secondary | ICD-10-CM | POA: Diagnosis not present

## 2017-05-05 DIAGNOSIS — I739 Peripheral vascular disease, unspecified: Secondary | ICD-10-CM | POA: Diagnosis not present

## 2017-05-05 DIAGNOSIS — N183 Chronic kidney disease, stage 3 (moderate): Secondary | ICD-10-CM | POA: Diagnosis not present

## 2017-05-05 DIAGNOSIS — Z4781 Encounter for orthopedic aftercare following surgical amputation: Secondary | ICD-10-CM | POA: Diagnosis not present

## 2017-05-05 DIAGNOSIS — M810 Age-related osteoporosis without current pathological fracture: Secondary | ICD-10-CM | POA: Diagnosis not present

## 2017-05-09 ENCOUNTER — Ambulatory Visit: Payer: Medicare HMO | Admitting: Cardiology

## 2017-05-10 ENCOUNTER — Telehealth: Payer: Self-pay | Admitting: Vascular Surgery

## 2017-05-10 ENCOUNTER — Telehealth: Payer: Self-pay | Admitting: *Deleted

## 2017-05-10 NOTE — Telephone Encounter (Signed)
Her new appt is 05/12/17 at 11:00. I spoke to her parents to let them know.

## 2017-05-10 NOTE — Telephone Encounter (Signed)
LEFT MESSAGE THAT PATIENT DID SHOW FOR PRE- OP (CARDIAC )CLEARANCE ON 05/09/17.

## 2017-05-10 NOTE — Telephone Encounter (Signed)
-----   Message from Phillips Odorarol S Pullins, RN sent at 05/09/2017  5:57 PM EDT ----- Regarding: needs cardiology appt. rescheduled Contact: [phone] The above phone # is pt's. mother.  The pt's phone is not working  [phone])  Please notify Dr. Elissa HeftyHarding's office that the mother was not aware of pt's appt. today, and request another appt. ASAP; cardiac clearance for Aortobifemoral Bypass. (I will have to cancel her surgery on 6/14, as she will not be ready)  Let me know please when the Cardiology appt. is rescheduled. Thank you.

## 2017-05-12 ENCOUNTER — Ambulatory Visit (INDEPENDENT_AMBULATORY_CARE_PROVIDER_SITE_OTHER): Payer: Medicare HMO | Admitting: Cardiology

## 2017-05-12 ENCOUNTER — Telehealth: Payer: Self-pay | Admitting: Cardiology

## 2017-05-12 ENCOUNTER — Encounter: Payer: Self-pay | Admitting: Cardiology

## 2017-05-12 VITALS — BP 118/78 | HR 90 | Ht 60.0 in | Wt 90.0 lb

## 2017-05-12 DIAGNOSIS — I10 Essential (primary) hypertension: Secondary | ICD-10-CM | POA: Diagnosis not present

## 2017-05-12 DIAGNOSIS — E785 Hyperlipidemia, unspecified: Secondary | ICD-10-CM

## 2017-05-12 DIAGNOSIS — I739 Peripheral vascular disease, unspecified: Secondary | ICD-10-CM

## 2017-05-12 DIAGNOSIS — Z01810 Encounter for preprocedural cardiovascular examination: Secondary | ICD-10-CM | POA: Insufficient documentation

## 2017-05-12 DIAGNOSIS — I998 Other disorder of circulatory system: Secondary | ICD-10-CM | POA: Diagnosis not present

## 2017-05-12 HISTORY — DX: Encounter for preprocedural cardiovascular examination: Z01.810

## 2017-05-12 HISTORY — DX: Peripheral vascular disease, unspecified: I73.9

## 2017-05-12 HISTORY — DX: Hyperlipidemia, unspecified: E78.5

## 2017-05-12 NOTE — Patient Instructions (Addendum)
   [facility] [place] Your physician has requested that you have a lexiscan myoview. For further information please visit [email or web address] Please follow instruction sheet, as given.  IF ABNORMAL WILL DO CATH. WILL CALL WITH RESULTS   Your physician recommends that you schedule a follow-up appointment 2 MONTHS WITH DR Herbie BaltimoreHARDING

## 2017-05-12 NOTE — Telephone Encounter (Signed)
Called the patient and left a VM for patient to call me back to schedule her stress test next week.

## 2017-05-12 NOTE — Progress Notes (Signed)
PCP: Hal Morales, NP  Clinic Note: Chief Complaint  Patient presents with  . New Patient (Initial Visit)    pre-op clearance  . PAD    Chronically occluded aorta; severe bilateral lower shoulder pain. Status post left AKA    HPI: Manaal Helzer is a 56 y.o. female who is being seen today for pre-operatice evaluation at the request of Lemar Livings Christoph*.([facility])  Esmerelda Finnigan was initially seen by Dr. Randie Heinz in February 2018 after she presented to the hospital on February 10 with acute ischemic left lower extremity.  She presented outside hospital was brought emergently to [facility] for vascular surgery evaluation. At that time apparently she had noted occasional chest pain episodes. She has never been seen by cardiologist. --> She was found to have absent peripheral pulses bilateral lower extremities. She underwent lower extremity angiogram was found to have chronic aortic occlusion with acute thrombus in the left iliac arteries. She underwent emergent left lower extremity thromboembolectomy, but proceeded to get worse. She eventually underwent left AKA on February 11.  She has subsequently had re-occlusion of bilateral iliac arteries & is being scheduled for urgent Aortobifem Bypass Surgery.  She is now referred for cardiology "clearance" for this surgery given her previous complaints of chest pain. (Prior to hospitalization, her only medical history was panic attacks and PTSD as well as heavy smoking history - she ended up being diagnosed with AKI and PAD prior to discharge) .  Recent Hospitalizations:   2/10-15/2018: Critical Limb Ischemia L>R --> eventually LLE AKA; followed by roughly a week in acute rehabilitation  Relook PV Angio 5/30 for R Leg pain --> B Iliac A stents occluded --> Plan AoBifem Bypass.  Studies Personally Reviewed - (if available, images/films reviewed: From Epic Chart or Care Everywhere)  2 Dec Feb 2018 -> EF 60-65%, otherwise mostly normal.   PV  Angio 01/08/17:  Chronic aortic occlusion with some acute thrombus on the left lower extremity --> substantial thromboembolectomy removed from the common femoral artery. Below the knee, there was chronically occluded tibial peroneal trunk with one vessel runoff via ATA.    Occluded aorta was stented (with stents into B Common Iliacs - Kissing Stents), reestablishing flow in the bilateral CFA.  L iliofemoral & fem-pop embolectomy; Endarterectomy of L BK Pop & TP trunk -- L Pop A covered stent  R LE - dominant runoff via Peroneal A to ankle.   L LE - able to establish runoff via ATA to ankle   Compartment fasciotomy of LLE with wound vac.  Rescue PV Procedure 01/08/17: (loss of distal pulse signal in LLE immediately following initial procedure  Contrast cutoff @ level of Pop A stent without distal flow reconstitution --> L DPA exposure revealed significant thrombus in ATA, little hope of Limb Salvage  L AKA 01/09/2017 (the patient initially refused amputation, but was noted to have necrotic muscle with continued pain and therefore with nonviable tissue was forced to undergo AKA)  PV Angio 5/302018 for pulseless right lower extremity and resting pain.  Bilateral iliac stents occluded; bilateral hypogastric arteries and renal arteries patent. No reconstitution of both common femoral arteries;   RLE Angio runoff BK with distal washout.  B CFA patent by [place] --> plan AortoBifem Bypass.   Interval History: Patient presents today a little bit confused because she thought she was post be undergoing her surgery today, but is here now for preoperative evaluation. Obviously since February, she has not been able to do any  activity or exertion, but prior to her acute critical limb ischemia event back in February, she had noticed some exertional dyspnea and chest discomfort.  Currently she denies any resting chest discomfort or dyspnea. No heart failure symptoms of PND, orthopnea or edema.  No palpitations,  lightheadedness, dizziness, weakness or syncope/near syncope. No TIA/amaurosis fugax symptoms.  ROS: A comprehensive was performed. Review of Systems  Constitutional: Positive for chills and fever. Negative for weight loss (Hasn't really been eating or drinking well since her first surgery).  HENT: Negative for congestion, hearing loss and nosebleeds.   Respiratory: Positive for cough and wheezing. Negative for shortness of breath.   Cardiovascular: Positive for claudication (Right leg pain).  Gastrointestinal: Negative for blood in stool, heartburn and melena.  Genitourinary: Negative for hematuria.  Musculoskeletal: Negative for falls and joint pain.  Neurological: Positive for dizziness (She is dizzy if she doesn't eat).  Endo/Heme/Allergies: Negative for environmental allergies.  Psychiatric/Behavioral: Positive for depression. Negative for memory loss. The patient is not nervous/anxious and does not have insomnia.    I have reviewed and (if needed) personally updated the patient's problem list, medications, allergies, past medical and surgical history, social and family history.   Past Medical History:  Diagnosis Date  . Cervical myelopathy (HCC)   . 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 Depression   . Dysphagia   . Hx of migraine headaches   . Hyperlipidemia   . Hypothyroid   . Leriche syndrome (HCC) 12/2016   Chronically occluded aorta  . Osteoporosis   . PAD (peripheral artery disease) (HCC)    Status post left AKA for extensive thromboembolism, right leg has in-line flow through popliteal artery only.  Bilateral iliac stents occluded  . Panic attacks   . PTSD (post-traumatic stress disorder)     Past Surgical History:  Procedure Laterality Date  . ABDOMINAL AORTOGRAM W/LOWER EXTREMITY Right 04/27/2017   Procedure: Abdominal Aortogram w/Lower Extremity;   Surgeon: Maeola Harman, MD;  Location: [facility] INVASIVE CV LAB;  Service: Cardiovascular;  Laterality: Right: Bilateral iliac stents occluded. Normal aortic flow with patent hypogastric and renal arteries. Femoral arteries patent by ultrasound; right leg and showed below-knee runoff -> plan Aortobifem Bypass  . AMPUTATION Left 01/09/2017   Procedure: AMPUTATION ABOVE KNEE;  Surgeon: Maeola Harman, MD;  Location: [facility] OR;  Service: Vascular;  Laterality: Left;  . AORTOGRAM Left 01/08/2017   Procedure: Ultrasound Guided Cannulation Left Common Femoral Artery; Aortagram;  Surgeon: Maeola Harman, MD;  Location: [facility] OR;  Service: Vascular;  Laterality: Left;  . ARTERY EXPLORATION Left 01/08/2017   Procedure: Left Common Femoral Artery Exploration;  Surgeon: Maeola Harman, MD;  Location: [facility] OR;  Service: Vascular;  Laterality: Left;  . EMBOLECTOMY Left 01/08/2017   Procedure: Left Lower Extremity Thromboembolectomy;  Surgeon: Maeola Harman, MD;  Location: [facility] OR;  Service: Vascular;  Laterality: Left;  . ENDARTERECTOMY POPLITEAL Left 01/08/2017   Procedure: Left Popliteal Endarterectomy;  Surgeon: Maeola Harman, MD;  Location: [facility] OR;  Service: Vascular;  Laterality: Left;  . INSERTION OF ILIAC STENT Bilateral 01/08/2017   Procedure: Bilateral Common Iliac Stent and Left Popliteal Artery Stent;  Surgeon: Maeola Harman, MD;  Location: [facility] OR;  Service: Vascular;  Laterality: Bilateral;  . LOWER EXTREMITY ANGIOGRAM Bilateral 01/08/2017   Procedure: Bilateral Lower Extremity Angiogram;  Surgeon: Maeola Harman, MD;  Location: [facility] OR;  Service: Vascular;  Laterality: Bilateral;  . PATCH ANGIOPLASTY  01/08/2017   Procedure: Patch Angioplasty Left Popliteal Artery ;  Surgeon: Maeola Harman, MD;  Location: [facility] OR;  Service: Vascular;;    Current Meds  Medication Sig  . aspirin EC 81 MG tablet Take 81 mg by mouth daily.  Marland Kitchen  atorvastatin (LIPITOR) 80 MG tablet Take 1 tablet (80 mg total) by mouth daily.  . busPIRone (BUSPAR) 10 MG tablet Take 10-15 mg by mouth See admin instructions. Take 1 and 1/2 tablets every morning and take 1 tablet at bedtime  . gabapentin (NEURONTIN) 400 MG capsule Take 1 capsule (400 mg total) by mouth 3 (three) times daily.  . mirtazapine (REMERON) 15 MG tablet Take 15 mg by mouth at bedtime.  . nicotine (NICODERM CQ - DOSED IN MG/24 HR) 7 mg/24hr patch Place 1 patch (7 mg total) onto the skin daily.  . sertraline (ZOLOFT) 50 MG tablet Take 1 tablet (50 mg total) by mouth daily.  . traMADol (ULTRAM) 50 MG tablet Take 1 tablet (50 mg total) by mouth daily.    Allergies  Allergen Reactions  . Clindamycin/Lincomycin Rash    Social History   Social History  . Marital status: Married    Spouse name: N/A  . Number of children: N/A  . Years of education: N/A   Social History Main Topics  . Smoking status: Current Every Day Smoker  . Smokeless tobacco: Never Used  . Alcohol use No  . Drug use: No  . Sexual activity: Not Asked   Other Topics Concern  . None   Social History Narrative   She is married. No kids. Currently disabled. No longer working. She still smokes at least a pack a day, but is hoping to quit with patches. Does not drink alcohol or use illicit drugs    family history includes Cancer in her maternal grandmother; Heart disease in her maternal grandfather.  Wt Readings from Last 3 Encounters:  05/12/17 90 lb (40.8 kg)  04/27/17 114 lb (51.7 kg)  04/22/17 114 lb (51.7 kg)    PHYSICAL EXAM BP 118/78 (BP Location: Left Arm, Patient Position: Sitting, Cuff Size: Small)   Pulse 90   Ht 5' (1.524 m)   Wt 90 lb (40.8 kg)   BMI 17.58 kg/m  General appearance: Ill-appearing woman who appears much labeled older than her stated age. She is in a wheelchair following her recent left leg amputation. She is in no acute distress. HEENT: [place]/AT, EOMI, MMM, anicteric  sclera Neck: no adenopathy, no carotid bruit and no JVD Lungs: clear to auscultation bilaterally, normal percussion bilaterally and non-labored Heart: regular rate and rhythm, S1 &S2 normal, no murmur, click, rub or gallop; nondisplaced PMI Abdomen: soft, non-tender; bowel sounds normal; no masses,  no organomegaly; no HJR Extremities: Well-healed left AKA scar. Right leg is still relatively warm to touch. No edema. Pulses: 2+ and symmetric radial, but significantly diminished right pedal pulses, PT pulses not palpable Skin: mobility and turgor normal, no evidence of bleeding or bruising and no lesions noted  Neurologic: Mental status: Alert & oriented x 3, thought content appropriate; non-focal exam.  Pleasant mood & affect. Cranial nerves: normal (II-XII grossly intact); overall, she is not a very good historian    Adult ECG Report  Rate: 56 ;  Rhythm: sinus bradycardia;   Narrative Interpretation: otherwise normal   Other studies Reviewed: Additional studies/ records that were reviewed today include:  Recent Labs:  Lab Results  Component Value Date   CREATININE 1.10 (H) 04/27/2017   BUN 16 04/27/2017   NA 139 04/27/2017   K 4.6 04/27/2017   CL 103 04/27/2017   CO2 29 01/17/2017   No results found for: CHOL, HDL, LDLCALC, LDLDIRECT, TRIG, CHOLHDL   ASSESSMENT / PLAN: Problem List Items Addressed This Visit    Benign essential HTN (Chronic)    Listed as a diagnosis, but I do not think she is hypertensive. She is on no medications and was borderline hypotensive when she saw Dr. Randie Heinz. Her blood pressure looks great today.      Relevant Orders   EKG 12-Lead (Completed)   NM Myocar Multi W/Spect W/Wall Motion / EF   Myocardial Perfusion Imaging   Hyperlipidemia LDL goal <70    With the extent of PAD, she needs aggressive lipid management. She is on atorvastatin 80 mg labs have not been rechecked.  Ischemic time postop      Ischemia of lower extremity    Status post  left AKA, now with left lower extremity ischemia. Pending aortobifem bypass. The urgent procedure.      Peripheral artery disease (HCC)   Relevant Orders   EKG 12-Lead (Completed)   NM Myocar Multi W/Spect W/Wall Motion / EF   Myocardial Perfusion Imaging   Pre-operative cardiovascular examination - Primary    Difficult situation, she certainly needs her surgery done and if this needs to be done in an urgent fashion, I would recommend proceeding with her surgery without undergoing cardiac evaluation. However note some exertional dyspnea and chest pain prior to having her rehabilitation in February. The fact that she tolerated those procedures well knees that she should do okay with this upcoming surgery. The main difference is that this upcoming surgery now is a abdominal aortic surgery, which changed it to a high-risk surgery from a cardiac standpoint.  She otherwise does not have any significant Peri-op CV RFs - no "active CAD Sx" (although, she is now wheelchair bound & this cannot be adequately assessed), No CHF (normal Echo), she did have AKI during her index hospital stay (related to [facility] & contrast / shock) that is now resolved, No Diabetes.  Given the extent of PAD, think it is reasonable to exclude severe CAD. --> otherwise she would still be a low risk patient for high risk surgery.  Plan: Lexiscan Myoview. If low or intermediate risk, would proceed with surgery, however if high risk, we will schedule cardiac catheterization.         Relevant Orders   EKG 12-Lead (Completed)   NM Myocar Multi W/Spect W/Wall Motion / EF   Myocardial Perfusion Imaging      Extensive chart review to understand the extent of her PAD hospitalizations. Multiple vascular surgery procedure reviewed along with echocardiogram.  > 30 min of chart review + 45 min with direct patient contact.  > 50% was in direct counseling & discussion.  Current medicines are reviewed at length with the patient today.  (+/- concerns) None The following changes have been made: None  Patient Instructions    SCHEDULE  AT [facility] Your physician has requested that you have a lexiscan myoview. For further information please visit [email or web address] Please follow instruction sheet, as given.  IF ABNORMAL WILL DO CATH. WILL CALL WITH RESULTS   Your physician recommends that you schedule a follow-up appointment 2 MONTHS WITH DR Herbie Baltimore    Studies Ordered:   Orders Placed This Encounter  Procedures  .  NM Myocar Multi W/Spect W/Wall Motion / EF  . Myocardial Perfusion Imaging  . EKG 12-Lead      Bryan Lemmaavid Jasmynn Pfalzgraf, M.D., M.S. Interventional Cardiologist   Pager # [phone] Phone # [phone] [address] [place], [place] [address]

## 2017-05-14 ENCOUNTER — Encounter: Payer: Self-pay | Admitting: Cardiology

## 2017-05-14 NOTE — Assessment & Plan Note (Signed)
Listed as a diagnosis, but I do not think she is hypertensive. She is on no medications and was borderline hypotensive when she saw Dr. Randie Heinzain. Her blood pressure looks great today.

## 2017-05-14 NOTE — Assessment & Plan Note (Signed)
Difficult situation, she certainly needs her surgery done and if this needs to be done in an urgent fashion, I would recommend proceeding with her surgery without undergoing cardiac evaluation. However note some exertional dyspnea and chest pain prior to having her rehabilitation in February. The fact that she tolerated those procedures well knees that she should do okay with this upcoming surgery. The main difference is that this upcoming surgery now is a abdominal aortic surgery, which changed it to a high-risk surgery from a cardiac standpoint.  She otherwise does not have any significant Peri-op CV RFs - no "active CAD Sx" (although, she is now wheelchair bound & this cannot be adequately assessed), No CHF (normal Echo), she did have AKI during her index [facility]) that is now resolved, No Diabetes.  Given the extent of PAD, think it is reasonable to exclude severe CAD. --> otherwise she would still be a low risk patient for high risk surgery.  Plan: Lexiscan Myoview. If low or intermediate risk, would proceed with surgery, however if high risk, we will schedule cardiac catheterization.

## 2017-05-14 NOTE — Assessment & Plan Note (Signed)
With the extent of PAD, she needs aggressive lipid management. She is on atorvastatin 80 mg labs have not been rechecked.  Ischemic time postop

## 2017-05-14 NOTE — Assessment & Plan Note (Signed)
Status post left AKA, now with left lower extremity ischemia. Pending aortobifem bypass. The urgent procedure.

## 2017-05-16 ENCOUNTER — Telehealth (HOSPITAL_COMMUNITY): Payer: Self-pay | Admitting: *Deleted

## 2017-05-16 NOTE — Telephone Encounter (Signed)
Patient given detailed instructions per Myocardial Perfusion Study Information Sheet for the test on 05/17/17. Patient notified to arrive 15 minutes early and that it is imperative to arrive on time for appointment to keep from having the test rescheduled.  If you need to cancel or reschedule your appointment, please call the office within 24 hours of your appointment. . Patient verbalized understanding. Laylia Mui Jacqueline     

## 2017-05-17 ENCOUNTER — Ambulatory Visit (HOSPITAL_COMMUNITY): Payer: Medicare HMO | Attending: Cardiovascular Disease

## 2017-05-17 DIAGNOSIS — Z01810 Encounter for preprocedural cardiovascular examination: Secondary | ICD-10-CM | POA: Diagnosis not present

## 2017-05-17 DIAGNOSIS — I739 Peripheral vascular disease, unspecified: Secondary | ICD-10-CM | POA: Insufficient documentation

## 2017-05-17 DIAGNOSIS — I10 Essential (primary) hypertension: Secondary | ICD-10-CM | POA: Diagnosis not present

## 2017-05-17 LAB — MYOCARDIAL PERFUSION IMAGING
CHL CUP NUCLEAR SRS: 1
CHL CUP RESTING HR STRESS: 64 {beats}/min
LV sys vol: 10 mL
LVDIAVOL: 38 mL (ref 46–106)
NUC STRESS TID: 0.85
Peak HR: 121 {beats}/min
RATE: 0.32
SDS: 3
SSS: 4

## 2017-05-17 MED ORDER — TECHNETIUM TC 99M TETROFOSMIN IV KIT
32.5000 | PACK | Freq: Once | INTRAVENOUS | Status: AC | PRN
  Administered 2017-05-17: 32.5 via INTRAVENOUS
  Filled 2017-05-17: qty 33

## 2017-05-17 MED ORDER — REGADENOSON 0.4 MG/5ML IV SOLN
0.4000 mg | Freq: Once | INTRAVENOUS | Status: AC
  Administered 2017-05-17: 0.4 mg via INTRAVENOUS

## 2017-05-17 MED ORDER — TECHNETIUM TC 99M TETROFOSMIN IV KIT
11.0000 | PACK | Freq: Once | INTRAVENOUS | Status: AC | PRN
  Administered 2017-05-17: 11 via INTRAVENOUS
  Filled 2017-05-17: qty 11

## 2017-05-19 ENCOUNTER — Ambulatory Visit: Payer: Medicare HMO | Admitting: Cardiology

## 2017-05-20 ENCOUNTER — Telehealth: Payer: Self-pay | Admitting: *Deleted

## 2017-05-20 ENCOUNTER — Other Ambulatory Visit: Payer: Self-pay

## 2017-05-20 DIAGNOSIS — M4712 Other spondylosis with myelopathy, cervical region: Secondary | ICD-10-CM | POA: Diagnosis not present

## 2017-05-20 DIAGNOSIS — Z89612 Acquired absence of left leg above knee: Secondary | ICD-10-CM | POA: Diagnosis not present

## 2017-05-20 NOTE — Telephone Encounter (Signed)
Spoke to patient. Result given . Verbalized understanding  aware she has clearance for surgery

## 2017-05-20 NOTE — Telephone Encounter (Signed)
-----   Message from Marykay Lexavid W Harding, MD sent at 05/19/2017  5:09 PM EDT ----- Good News - Low Risk Nuclear ST; Normal pump function. No sign of heart attack.  Will forward to Dr. Randie Heinzain.  OK for Surgery.  Bryan Lemmaavid Harding, MD   Pls forward to PCP [place] Creekara Gunter, NP

## 2017-05-24 ENCOUNTER — Encounter (HOSPITAL_COMMUNITY): Payer: Self-pay

## 2017-05-24 ENCOUNTER — Encounter (HOSPITAL_COMMUNITY)
Admission: RE | Admit: 2017-05-24 | Discharge: 2017-05-24 | Disposition: A | Discharge status: Home / Self Care | Payer: Medicare HMO | Source: Ambulatory Visit | Attending: Vascular Surgery | Admitting: Vascular Surgery

## 2017-05-24 DIAGNOSIS — Z01812 Encounter for preprocedural laboratory examination: Secondary | ICD-10-CM | POA: Insufficient documentation

## 2017-05-24 DIAGNOSIS — I998 Other disorder of circulatory system: Secondary | ICD-10-CM | POA: Insufficient documentation

## 2017-05-24 DIAGNOSIS — I7789 Other specified disorders of arteries and arterioles: Secondary | ICD-10-CM | POA: Insufficient documentation

## 2017-05-24 LAB — COMPREHENSIVE METABOLIC PANEL
ALK PHOS: 94 U/L (ref 38–126)
ALT: 28 U/L (ref 14–54)
ANION GAP: 8 (ref 5–15)
AST: 25 U/L (ref 15–41)
Albumin: 3.8 g/dL (ref 3.5–5.0)
BILIRUBIN TOTAL: 0.4 mg/dL (ref 0.3–1.2)
BUN: 7 mg/dL (ref 6–20)
CO2: 25 mmol/L (ref 22–32)
CREATININE: 1.01 mg/dL — AB (ref 0.44–1.00)
Calcium: 9.3 mg/dL (ref 8.9–10.3)
Chloride: 107 mmol/L (ref 101–111)
Glucose, Bld: 67 mg/dL (ref 65–99)
Potassium: 3.9 mmol/L (ref 3.5–5.1)
SODIUM: 140 mmol/L (ref 135–145)
TOTAL PROTEIN: 6.9 g/dL (ref 6.5–8.1)

## 2017-05-24 LAB — CBC
HCT: 39.3 % (ref 36.0–46.0)
HEMOGLOBIN: 12.5 g/dL (ref 12.0–15.0)
MCH: 29.6 pg (ref 26.0–34.0)
MCHC: 31.8 g/dL (ref 30.0–36.0)
MCV: 92.9 fL (ref 78.0–100.0)
Platelets: 320 10*3/uL (ref 150–400)
RBC: 4.23 MIL/uL (ref 3.87–5.11)
RDW: 14.2 % (ref 11.5–15.5)
WBC: 10.3 10*3/uL (ref 4.0–10.5)

## 2017-05-24 LAB — URINALYSIS, ROUTINE W REFLEX MICROSCOPIC
Bacteria, UA: NONE SEEN
Bilirubin Urine: NEGATIVE
Glucose, UA: NEGATIVE mg/dL
Ketones, ur: NEGATIVE mg/dL
Leukocytes, UA: NEGATIVE
Nitrite: NEGATIVE
Protein, ur: NEGATIVE mg/dL
SPECIFIC GRAVITY, URINE: 1.003 — AB (ref 1.005–1.030)
pH: 5 (ref 5.0–8.0)

## 2017-05-24 LAB — PROTIME-INR
INR: 0.93
Prothrombin Time: 12.4 seconds (ref 11.4–15.2)

## 2017-05-24 LAB — SURGICAL PCR SCREEN
MRSA, PCR: NEGATIVE
Staphylococcus aureus: NEGATIVE

## 2017-05-24 LAB — APTT: aPTT: 25 seconds (ref 24–36)

## 2017-05-24 MED ORDER — CHLORHEXIDINE GLUCONATE 4 % EX LIQD: 60.0000 mL | Freq: Once | CUTANEOUS | Status: DC

## 2017-05-24 NOTE — Pre-Procedure Instructions (Signed)
Alison Lynch  05/24/2017      [facility] - [place], [place] - [address] [place] [address] Phone: [phone] Fax: [phone]    Your procedure is scheduled on June 29  [facility] at 0530 A.M.  Call this number if you have problems the morning of surgery:  [phone]   Remember:  Do not eat food or drink liquids after midnight.   Take these medicines the morning of surgery with A SIP OF WATER busPIRone (BUSPAR),  gabapentin (NEURONTIN) , sertraline (ZOLOFT) , traMADol (ULTRAM)  7 days prior to surgery STOP taking any Aspirin, Aleve, Naproxen, Ibuprofen, Motrin, Advil, Goody's, BC's, all herbal medications, fish oil, and all vitamins    Do not wear jewelry, make-up or nail polish.  Do not wear lotions, powders, or perfumes, or deoderant.  Do not shave 48 hours prior to surgery.    Do not bring valuables to the hospital.  [facility] is not responsible for any belongings or valuables.  Contacts, dentures or bridgework may not be worn into surgery.  Leave your suitcase in the car.  After surgery it may be brought to your room.  For patients admitted to the hospital, discharge time will be determined by your treatment team.  Patients discharged the day of surgery will not be allowed to drive home.   Special instructions:   [place]- Preparing For Surgery  Before surgery, you can play an important role. Because skin is not sterile, your skin needs to be as free of germs as possible. You can reduce the number of germs on your skin by washing with CHG (chlorahexidine gluconate) Soap before surgery.  CHG is an antiseptic cleaner which kills germs and bonds with the skin to continue killing germs even after washing.  Please do not use if you have an allergy to CHG or antibacterial soaps. If your skin becomes reddened/irritated stop using the CHG.  Do not shave (including legs and underarms) for at least 48  hours prior to first CHG shower. It is OK to shave your face.  Please follow these instructions carefully.   1. Shower the NIGHT BEFORE SURGERY and the MORNING OF SURGERY with CHG.   2. If you chose to wash your hair, wash your hair first as usual with your normal shampoo.  3. After you shampoo, rinse your hair and body thoroughly to remove the shampoo.  4. Use CHG as you would any other liquid soap. You can apply CHG directly to the skin and wash gently with a scrungie or a clean washcloth.   5. Apply the CHG Soap to your body ONLY FROM THE NECK DOWN.  Do not use on open wounds or open sores. Avoid contact with your eyes, ears, mouth and genitals (private parts). Wash genitals (private parts) with your normal soap.  6. Wash thoroughly, paying special attention to the area where your surgery will be performed.  7. Thoroughly rinse your body with warm water from the neck down.  8. DO NOT shower/wash with your normal soap after using and rinsing off the CHG Soap.  9. Pat yourself dry with a CLEAN TOWEL.   10. Wear CLEAN PAJAMAS   11. Place CLEAN SHEETS on your bed the night of your first shower and DO NOT SLEEP WITH PETS.    Day of Surgery: Do not apply any deodorants/lotions. Please wear clean clothes to the hospital/surgery center.  Please read over the following fact sheets that you were given.

## 2017-05-30 ENCOUNTER — Inpatient Hospital Stay (HOSPITAL_COMMUNITY): Payer: Medicare HMO | Admitting: Certified Registered Nurse Anesthetist

## 2017-05-30 ENCOUNTER — Encounter (HOSPITAL_COMMUNITY)
Admission: RE | Disposition: A | Discharge status: Home Health | Payer: Self-pay | Source: Ambulatory Visit | Attending: Vascular Surgery

## 2017-05-30 ENCOUNTER — Encounter (HOSPITAL_COMMUNITY): Payer: Self-pay | Admitting: Certified Registered Nurse Anesthetist

## 2017-05-30 ENCOUNTER — Inpatient Hospital Stay (HOSPITAL_COMMUNITY): Payer: Medicare HMO

## 2017-05-30 ENCOUNTER — Inpatient Hospital Stay (HOSPITAL_COMMUNITY)
Admission: RE | Admit: 2017-05-30 | Discharge: 2017-06-06 | DRG: 271 | Disposition: A | Discharge status: Home Health | Payer: Medicare HMO | Source: Ambulatory Visit | Attending: Vascular Surgery | Admitting: Vascular Surgery

## 2017-05-30 DIAGNOSIS — S3633XA Laceration of stomach, initial encounter: Secondary | ICD-10-CM | POA: Diagnosis not present

## 2017-05-30 DIAGNOSIS — Z95828 Presence of other vascular implants and grafts: Secondary | ICD-10-CM

## 2017-05-30 DIAGNOSIS — K9172 Accidental puncture and laceration of a digestive system organ or structure during other procedure: Secondary | ICD-10-CM | POA: Diagnosis not present

## 2017-05-30 DIAGNOSIS — M62838 Other muscle spasm: Secondary | ICD-10-CM | POA: Diagnosis not present

## 2017-05-30 DIAGNOSIS — I129 Hypertensive chronic kidney disease with stage 1 through stage 4 chronic kidney disease, or unspecified chronic kidney disease: Secondary | ICD-10-CM | POA: Diagnosis present

## 2017-05-30 DIAGNOSIS — E785 Hyperlipidemia, unspecified: Secondary | ICD-10-CM | POA: Diagnosis not present

## 2017-05-30 DIAGNOSIS — I70221 Atherosclerosis of native arteries of extremities with rest pain, right leg: Secondary | ICD-10-CM | POA: Diagnosis present

## 2017-05-30 DIAGNOSIS — I998 Other disorder of circulatory system: Secondary | ICD-10-CM | POA: Diagnosis present

## 2017-05-30 DIAGNOSIS — I959 Hypotension, unspecified: Secondary | ICD-10-CM | POA: Diagnosis not present

## 2017-05-30 DIAGNOSIS — Z95820 Peripheral vascular angioplasty status with implants and grafts: Secondary | ICD-10-CM | POA: Diagnosis not present

## 2017-05-30 DIAGNOSIS — M810 Age-related osteoporosis without current pathological fracture: Secondary | ICD-10-CM | POA: Diagnosis present

## 2017-05-30 DIAGNOSIS — I739 Peripheral vascular disease, unspecified: Secondary | ICD-10-CM | POA: Diagnosis not present

## 2017-05-30 DIAGNOSIS — G546 Phantom limb syndrome with pain: Secondary | ICD-10-CM | POA: Diagnosis present

## 2017-05-30 DIAGNOSIS — Z79891 Long term (current) use of opiate analgesic: Secondary | ICD-10-CM

## 2017-05-30 DIAGNOSIS — Y832 Surgical operation with anastomosis, bypass or graft as the cause of abnormal reaction of the patient, or of later complication, without mention of misadventure at the time of the procedure: Secondary | ICD-10-CM | POA: Diagnosis not present

## 2017-05-30 DIAGNOSIS — Z79899 Other long term (current) drug therapy: Secondary | ICD-10-CM

## 2017-05-30 DIAGNOSIS — Z881 Allergy status to other antibiotic agents status: Secondary | ICD-10-CM | POA: Diagnosis not present

## 2017-05-30 DIAGNOSIS — F17210 Nicotine dependence, cigarettes, uncomplicated: Secondary | ICD-10-CM | POA: Diagnosis present

## 2017-05-30 DIAGNOSIS — Z7902 Long term (current) use of antithrombotics/antiplatelets: Secondary | ICD-10-CM

## 2017-05-30 DIAGNOSIS — I7409 Other arterial embolism and thrombosis of abdominal aorta: Secondary | ICD-10-CM | POA: Diagnosis not present

## 2017-05-30 DIAGNOSIS — E039 Hypothyroidism, unspecified: Secondary | ICD-10-CM | POA: Diagnosis present

## 2017-05-30 DIAGNOSIS — I745 Embolism and thrombosis of iliac artery: Secondary | ICD-10-CM | POA: Diagnosis not present

## 2017-05-30 DIAGNOSIS — N183 Chronic kidney disease, stage 3 (moderate): Secondary | ICD-10-CM | POA: Diagnosis not present

## 2017-05-30 DIAGNOSIS — F410 Panic disorder [episodic paroxysmal anxiety] without agoraphobia: Secondary | ICD-10-CM | POA: Diagnosis present

## 2017-05-30 DIAGNOSIS — F4310 Post-traumatic stress disorder, unspecified: Secondary | ICD-10-CM | POA: Diagnosis present

## 2017-05-30 DIAGNOSIS — Z89612 Acquired absence of left leg above knee: Secondary | ICD-10-CM

## 2017-05-30 DIAGNOSIS — F329 Major depressive disorder, single episode, unspecified: Secondary | ICD-10-CM | POA: Diagnosis present

## 2017-05-30 DIAGNOSIS — R69 Illness, unspecified: Secondary | ICD-10-CM | POA: Diagnosis not present

## 2017-05-30 HISTORY — PX: AORTA - BILATERAL FEMORAL ARTERY BYPASS GRAFT: SHX1175

## 2017-05-30 HISTORY — DX: Other arterial embolism and thrombosis of abdominal aorta: I74.09

## 2017-05-30 HISTORY — DX: Embolism and thrombosis of iliac artery: I74.5

## 2017-05-30 LAB — POCT I-STAT 7, (LYTES, BLD GAS, ICA,H+H)
Acid-base deficit: 1 mmol/L (ref 0.0–2.0)
Acid-base deficit: 1 mmol/L (ref 0.0–2.0)
Bicarbonate: 23.7 mmol/L (ref 20.0–28.0)
Bicarbonate: 25.5 mmol/L (ref 20.0–28.0)
Calcium, Ion: 1.02 mmol/L — ABNORMAL LOW (ref 1.15–1.40)
Calcium, Ion: 1.1 mmol/L — ABNORMAL LOW (ref 1.15–1.40)
HCT: 25 % — ABNORMAL LOW (ref 36.0–46.0)
HEMATOCRIT: 20 % — AB (ref 36.0–46.0)
HEMOGLOBIN: 6.8 g/dL — AB (ref 12.0–15.0)
Hemoglobin: 8.5 g/dL — ABNORMAL LOW (ref 12.0–15.0)
O2 SAT: 100 %
O2 Saturation: 100 %
PCO2 ART: 48.6 mmHg — AB (ref 32.0–48.0)
PH ART: 7.403 (ref 7.350–7.450)
PO2 ART: 471 mmHg — AB (ref 83.0–108.0)
Patient temperature: 36.7
Potassium: 3.3 mmol/L — ABNORMAL LOW (ref 3.5–5.1)
Potassium: 3.9 mmol/L (ref 3.5–5.1)
SODIUM: 144 mmol/L (ref 135–145)
Sodium: 144 mmol/L (ref 135–145)
TCO2: 25 mmol/L (ref 0–100)
TCO2: 27 mmol/L (ref 0–100)
pCO2 arterial: 38 mmHg (ref 32.0–48.0)
pH, Arterial: 7.326 — ABNORMAL LOW (ref 7.350–7.450)
pO2, Arterial: 260 mmHg — ABNORMAL HIGH (ref 83.0–108.0)

## 2017-05-30 LAB — CBC
HEMATOCRIT: 24.1 % — AB (ref 36.0–46.0)
Hemoglobin: 7.6 g/dL — ABNORMAL LOW (ref 12.0–15.0)
MCH: 29 pg (ref 26.0–34.0)
MCHC: 31.5 g/dL (ref 30.0–36.0)
MCV: 92 fL (ref 78.0–100.0)
Platelets: 197 10*3/uL (ref 150–400)
RBC: 2.62 MIL/uL — ABNORMAL LOW (ref 3.87–5.11)
RDW: 14.6 % (ref 11.5–15.5)
WBC: 12.4 10*3/uL — AB (ref 4.0–10.5)

## 2017-05-30 LAB — BASIC METABOLIC PANEL
Anion gap: 9 (ref 5–15)
BUN: 7 mg/dL (ref 6–20)
CO2: 21 mmol/L — AB (ref 22–32)
Calcium: 7.8 mg/dL — ABNORMAL LOW (ref 8.9–10.3)
Chloride: 110 mmol/L (ref 101–111)
Creatinine, Ser: 1.04 mg/dL — ABNORMAL HIGH (ref 0.44–1.00)
GFR calc Af Amer: >60 mL/min (ref >60)
GFR calc non Af Amer: 60 mL/min — ABNORMAL LOW (ref >60)
GLUCOSE: 140 mg/dL — AB (ref 65–99)
POTASSIUM: 3.4 mmol/L — AB (ref 3.5–5.1)
Sodium: 140 mmol/L (ref 135–145)

## 2017-05-30 LAB — BLOOD GAS, ARTERIAL
ACID-BASE DEFICIT: 3.9 mmol/L — AB (ref 0.0–2.0)
Bicarbonate: 21.2 mmol/L (ref 20.0–28.0)
DRAWN BY: 12971
O2 CONTENT: 2 L/min
O2 SAT: 98.6 %
PATIENT TEMPERATURE: 98.6
pCO2 arterial: 42.5 mmHg (ref 32.0–48.0)
pH, Arterial: 7.319 — ABNORMAL LOW (ref 7.350–7.450)
pO2, Arterial: 128 mmHg — ABNORMAL HIGH (ref 83.0–108.0)

## 2017-05-30 LAB — APTT: aPTT: 27 seconds (ref 24–36)

## 2017-05-30 LAB — PREPARE RBC (CROSSMATCH)

## 2017-05-30 LAB — PROTIME-INR
INR: 1.17
Prothrombin Time: 15 seconds (ref 11.4–15.2)

## 2017-05-30 LAB — MAGNESIUM: MAGNESIUM: 1.2 mg/dL — AB (ref 1.7–2.4)

## 2017-05-30 SURGERY — CREATION, BYPASS, ARTERIAL, AORTA TO FEMORAL, BILATERAL, USING GRAFT
Anesthesia: General

## 2017-05-30 MED ORDER — DEXAMETHASONE SODIUM PHOSPHATE 10 MG/ML IJ SOLN
INTRAMUSCULAR | Status: AC
  Filled 2017-05-30: qty 1

## 2017-05-30 MED ORDER — HYDROMORPHONE HCL 1 MG/ML IJ SOLN
INTRAMUSCULAR | Status: AC
  Filled 2017-05-30: qty 0.5

## 2017-05-30 MED ORDER — GUAIFENESIN-DM 100-10 MG/5ML PO SYRP
15.0000 mL | ORAL_SOLUTION | ORAL | Status: DC | PRN
  Filled 2017-05-30: qty 15

## 2017-05-30 MED ORDER — SODIUM CHLORIDE 0.9 % IV SOLN: Freq: Once | INTRAVENOUS | Status: DC

## 2017-05-30 MED ORDER — ROCURONIUM BROMIDE 10 MG/ML (PF) SYRINGE
PREFILLED_SYRINGE | INTRAVENOUS | Status: DC | PRN
  Administered 2017-05-30: 30 mg via INTRAVENOUS
  Administered 2017-05-30: 50 mg via INTRAVENOUS

## 2017-05-30 MED ORDER — PHENYLEPHRINE HCL 10 MG/ML IJ SOLN
INTRAVENOUS | Status: DC | PRN
  Administered 2017-05-30: 25 ug/min via INTRAVENOUS
  Administered 2017-05-30: 50 ug/min via INTRAVENOUS

## 2017-05-30 MED ORDER — ROCURONIUM BROMIDE 10 MG/ML (PF) SYRINGE
PREFILLED_SYRINGE | INTRAVENOUS | Status: AC
  Filled 2017-05-30: qty 5

## 2017-05-30 MED ORDER — HYDRALAZINE HCL 20 MG/ML IJ SOLN: 5.0000 mg | INTRAMUSCULAR | Status: DC | PRN

## 2017-05-30 MED ORDER — EPINEPHRINE PF 1 MG/10ML IJ SOSY
PREFILLED_SYRINGE | INTRAMUSCULAR | Status: AC
  Filled 2017-05-30: qty 10

## 2017-05-30 MED ORDER — DEXAMETHASONE SODIUM PHOSPHATE 10 MG/ML IJ SOLN
INTRAMUSCULAR | Status: DC | PRN
  Administered 2017-05-30: 10 mg via INTRAVENOUS

## 2017-05-30 MED ORDER — ONDANSETRON HCL 4 MG/2ML IJ SOLN
4.0000 mg | Freq: Four times a day (QID) | INTRAMUSCULAR | Status: DC | PRN
  Administered 2017-05-30 – 2017-06-03 (×3): 4 mg via INTRAVENOUS
  Filled 2017-05-30 (×3): qty 2

## 2017-05-30 MED ORDER — METOPROLOL TARTRATE 5 MG/5ML IV SOLN: 2.0000 mg | INTRAVENOUS | Status: DC | PRN

## 2017-05-30 MED ORDER — PROTAMINE SULFATE 10 MG/ML IV SOLN
INTRAVENOUS | Status: AC
  Filled 2017-05-30: qty 5

## 2017-05-30 MED ORDER — ONDANSETRON HCL 4 MG/2ML IJ SOLN
INTRAMUSCULAR | Status: AC
  Filled 2017-05-30: qty 2

## 2017-05-30 MED ORDER — BISACODYL 10 MG RE SUPP
10.0000 mg | Freq: Every day | RECTAL | Status: DC | PRN
  Administered 2017-06-01: 10 mg via RECTAL
  Filled 2017-05-30: qty 1

## 2017-05-30 MED ORDER — FENTANYL CITRATE (PF) 100 MCG/2ML IJ SOLN
INTRAMUSCULAR | Status: DC | PRN
  Administered 2017-05-30: 50 ug via INTRAVENOUS
  Administered 2017-05-30: 25 ug via INTRAVENOUS
  Administered 2017-05-30 (×5): 50 ug via INTRAVENOUS
  Administered 2017-05-30: 25 ug via INTRAVENOUS
  Administered 2017-05-30: 50 ug via INTRAVENOUS

## 2017-05-30 MED ORDER — SODIUM CHLORIDE 0.9 % IV SOLN: INTRAVENOUS | Status: DC

## 2017-05-30 MED ORDER — KCL IN DEXTROSE-NACL 20-5-0.45 MEQ/L-%-% IV SOLN
INTRAVENOUS | Status: DC
  Administered 2017-05-30 – 2017-06-02 (×5): via INTRAVENOUS
  Filled 2017-05-30 (×7): qty 1000

## 2017-05-30 MED ORDER — HYDROMORPHONE HCL 1 MG/ML IJ SOLN
0.2500 mg | INTRAMUSCULAR | Status: DC | PRN
  Administered 2017-05-30 (×2): 0.5 mg via INTRAVENOUS

## 2017-05-30 MED ORDER — PROMETHAZINE HCL 25 MG/ML IJ SOLN
6.2500 mg | INTRAMUSCULAR | Status: DC | PRN
  Administered 2017-05-30: 6.25 mg via INTRAVENOUS

## 2017-05-30 MED ORDER — ALBUMIN HUMAN 5 % IV SOLN: INTRAVENOUS | Status: DC | PRN

## 2017-05-30 MED ORDER — FENTANYL CITRATE (PF) 250 MCG/5ML IJ SOLN
INTRAMUSCULAR | Status: AC
  Filled 2017-05-30: qty 5

## 2017-05-30 MED ORDER — SODIUM CHLORIDE 0.9 % IV SOLN
INTRAVENOUS | Status: DC | PRN
  Administered 2017-05-30: 11:00:00 via INTRAVENOUS

## 2017-05-30 MED ORDER — ENOXAPARIN SODIUM 30 MG/0.3ML ~~LOC~~ SOLN
30.0000 mg | SUBCUTANEOUS | Status: DC
  Administered 2017-05-31 – 2017-06-05 (×6): 30 mg via SUBCUTANEOUS
  Filled 2017-05-30 (×6): qty 0.3

## 2017-05-30 MED ORDER — PROPOFOL 10 MG/ML IV BOLUS
INTRAVENOUS | Status: DC | PRN
  Administered 2017-05-30: 100 mg via INTRAVENOUS

## 2017-05-30 MED ORDER — LIDOCAINE 2% (20 MG/ML) 5 ML SYRINGE
INTRAMUSCULAR | Status: AC
  Filled 2017-05-30: qty 5

## 2017-05-30 MED ORDER — 0.9 % SODIUM CHLORIDE (POUR BTL) OPTIME
TOPICAL | Status: DC | PRN
  Administered 2017-05-30: 3000 mL

## 2017-05-30 MED ORDER — NALOXONE HCL 0.4 MG/ML IJ SOLN: 0.4000 mg | INTRAMUSCULAR | Status: DC | PRN

## 2017-05-30 MED ORDER — SODIUM CHLORIDE 0.9% FLUSH
9.0000 mL | INTRAVENOUS | Status: DC | PRN
  Administered 2017-06-02: 10 mL via INTRAVENOUS
  Filled 2017-05-30: qty 9

## 2017-05-30 MED ORDER — ORAL CARE MOUTH RINSE
15.0000 mL | Freq: Two times a day (BID) | OROMUCOSAL | Status: DC
  Administered 2017-05-30: 15 mL via OROMUCOSAL

## 2017-05-30 MED ORDER — DEXTROSE 5 % IV SOLN
1.5000 g | Freq: Two times a day (BID) | INTRAVENOUS | Status: AC
  Administered 2017-05-30 – 2017-05-31 (×2): 1.5 g via INTRAVENOUS
  Filled 2017-05-30 (×2): qty 1.5

## 2017-05-30 MED ORDER — MORPHINE SULFATE 2 MG/ML IV SOLN
INTRAVENOUS | Status: DC
  Administered 2017-05-30: 10.5 mg via INTRAVENOUS
  Administered 2017-05-30: 15:00:00 via INTRAVENOUS
  Administered 2017-05-30: 9 mg via INTRAVENOUS
  Administered 2017-05-31: 25.5 mg via INTRAVENOUS
  Administered 2017-05-31: 22:00:00 via INTRAVENOUS
  Administered 2017-05-31: 9 mg via INTRAVENOUS
  Administered 2017-05-31: 3 mg via INTRAVENOUS
  Administered 2017-05-31: 16.5 mg via INTRAVENOUS
  Administered 2017-05-31: 3 mg via INTRAVENOUS
  Administered 2017-05-31 – 2017-06-01 (×2): 10.5 mg via INTRAVENOUS
  Administered 2017-06-01: 2 mg via INTRAVENOUS
  Administered 2017-06-01: 16.5 mg via INTRAVENOUS
  Administered 2017-06-01: 6 mg via INTRAVENOUS
  Administered 2017-06-01: 16:00:00 via INTRAVENOUS
  Administered 2017-06-01: 12 mg via INTRAVENOUS
  Administered 2017-06-02: 9 mg via INTRAVENOUS
  Administered 2017-06-02: 10.5 mL via INTRAVENOUS
  Administered 2017-06-02: 18 mg via INTRAVENOUS
  Administered 2017-06-02: 13.5 mg via INTRAVENOUS
  Administered 2017-06-02: 07:00:00 via INTRAVENOUS
  Administered 2017-06-03: 0 mg via INTRAVENOUS
  Administered 2017-06-03: 13.5 mg via INTRAVENOUS
  Administered 2017-06-03: 11:00:00 via INTRAVENOUS
  Filled 2017-05-30 (×2): qty 25
  Filled 2017-05-30: qty 30
  Filled 2017-05-30 (×3): qty 25
  Filled 2017-05-30: qty 30
  Filled 2017-05-30 (×3): qty 25

## 2017-05-30 MED ORDER — SODIUM CHLORIDE 0.9 % IJ SOLN
INTRAMUSCULAR | Status: AC
  Filled 2017-05-30: qty 20

## 2017-05-30 MED ORDER — HYDROMORPHONE HCL 1 MG/ML IJ SOLN
INTRAMUSCULAR | Status: AC
  Administered 2017-05-30: 0.5 mg via INTRAVENOUS
  Filled 2017-05-30: qty 0.5

## 2017-05-30 MED ORDER — PANTOPRAZOLE SODIUM 40 MG PO TBEC: 40.0000 mg | DELAYED_RELEASE_TABLET | Freq: Every day | ORAL | Status: DC

## 2017-05-30 MED ORDER — CEFUROXIME SODIUM 1.5 G IV SOLR
1.5000 g | INTRAVENOUS | Status: AC
  Administered 2017-05-30: 1.5 g via INTRAVENOUS
  Filled 2017-05-30: qty 1.5

## 2017-05-30 MED ORDER — ALUM & MAG HYDROXIDE-SIMETH 200-200-20 MG/5ML PO SUSP: 15.0000 mL | ORAL | Status: DC | PRN

## 2017-05-30 MED ORDER — LABETALOL HCL 5 MG/ML IV SOLN: 10.0000 mg | INTRAVENOUS | Status: DC | PRN

## 2017-05-30 MED ORDER — DIPHENHYDRAMINE HCL 12.5 MG/5ML PO ELIX: 12.5000 mg | ORAL_SOLUTION | Freq: Four times a day (QID) | ORAL | Status: DC | PRN

## 2017-05-30 MED ORDER — DOCUSATE SODIUM 100 MG PO CAPS
100.0000 mg | ORAL_CAPSULE | Freq: Every day | ORAL | Status: DC
  Administered 2017-06-03 – 2017-06-06 (×3): 100 mg via ORAL
  Filled 2017-05-30 (×5): qty 1

## 2017-05-30 MED ORDER — PROMETHAZINE HCL 25 MG/ML IJ SOLN
INTRAMUSCULAR | Status: AC
  Administered 2017-05-30: 6.25 mg via INTRAVENOUS
  Filled 2017-05-30: qty 1

## 2017-05-30 MED ORDER — SUGAMMADEX SODIUM 200 MG/2ML IV SOLN
INTRAVENOUS | Status: DC | PRN
  Administered 2017-05-30: 100 mg via INTRAVENOUS

## 2017-05-30 MED ORDER — SUGAMMADEX SODIUM 200 MG/2ML IV SOLN
INTRAVENOUS | Status: AC
  Filled 2017-05-30: qty 2

## 2017-05-30 MED ORDER — ACETAMINOPHEN 325 MG RE SUPP
325.0000 mg | RECTAL | Status: DC | PRN
  Filled 2017-05-30: qty 2

## 2017-05-30 MED ORDER — SODIUM CHLORIDE 0.9 % IV SOLN
INTRAVENOUS | Status: DC | PRN
  Administered 2017-05-30: 500 mL

## 2017-05-30 MED ORDER — DIPHENHYDRAMINE HCL 50 MG/ML IJ SOLN
12.5000 mg | Freq: Four times a day (QID) | INTRAMUSCULAR | Status: DC | PRN
  Administered 2017-06-03: 12.5 mg via INTRAVENOUS
  Filled 2017-05-30: qty 1

## 2017-05-30 MED ORDER — HEPARIN SODIUM (PORCINE) 1000 UNIT/ML IJ SOLN
INTRAMUSCULAR | Status: AC
  Filled 2017-05-30: qty 1

## 2017-05-30 MED ORDER — LACTATED RINGERS IV SOLN
INTRAVENOUS | Status: DC | PRN
  Administered 2017-05-30 (×2): via INTRAVENOUS

## 2017-05-30 MED ORDER — NICOTINE 7 MG/24HR TD PT24
7.0000 mg | MEDICATED_PATCH | Freq: Every day | TRANSDERMAL | Status: DC
  Administered 2017-05-30 – 2017-06-06 (×8): 7 mg via TRANSDERMAL
  Filled 2017-05-30 (×8): qty 1

## 2017-05-30 MED ORDER — SODIUM CHLORIDE 0.9 % IV SOLN: 500.0000 mL | Freq: Once | INTRAVENOUS | Status: DC | PRN

## 2017-05-30 MED ORDER — EPINEPHRINE PF 1 MG/10ML IJ SOSY
PREFILLED_SYRINGE | INTRAMUSCULAR | Status: DC | PRN
  Administered 2017-05-30: 30 ug via INTRAVENOUS
  Administered 2017-05-30: 10 ug via INTRAVENOUS

## 2017-05-30 MED ORDER — PANTOPRAZOLE SODIUM 40 MG IV SOLR
40.0000 mg | Freq: Every day | INTRAVENOUS | Status: DC
  Administered 2017-05-30 – 2017-06-03 (×5): 40 mg via INTRAVENOUS
  Filled 2017-05-30 (×5): qty 40

## 2017-05-30 MED ORDER — ESMOLOL HCL 100 MG/10ML IV SOLN
INTRAVENOUS | Status: DC | PRN
  Administered 2017-05-30: 30 mg via INTRAVENOUS

## 2017-05-30 MED ORDER — MAGNESIUM SULFATE 2 GM/50ML IV SOLN
2.0000 g | Freq: Once | INTRAVENOUS | Status: AC | PRN
  Administered 2017-05-30: 2 g via INTRAVENOUS
  Filled 2017-05-30: qty 50

## 2017-05-30 MED ORDER — ALBUMIN HUMAN 5 % IV SOLN
INTRAVENOUS | Status: DC | PRN
  Administered 2017-05-30 (×3): via INTRAVENOUS

## 2017-05-30 MED ORDER — PROPOFOL 10 MG/ML IV BOLUS
INTRAVENOUS | Status: AC
  Filled 2017-05-30: qty 20

## 2017-05-30 MED ORDER — MIDAZOLAM HCL 5 MG/5ML IJ SOLN
INTRAMUSCULAR | Status: DC | PRN
  Administered 2017-05-30: 2 mg via INTRAVENOUS

## 2017-05-30 MED ORDER — ONDANSETRON HCL 4 MG/2ML IJ SOLN
INTRAMUSCULAR | Status: DC | PRN
  Administered 2017-05-30: 4 mg via INTRAVENOUS

## 2017-05-30 MED ORDER — ACETAMINOPHEN 325 MG PO TABS
325.0000 mg | ORAL_TABLET | ORAL | Status: DC | PRN
  Filled 2017-05-30: qty 2

## 2017-05-30 MED ORDER — PHENOL 1.4 % MT LIQD
1.0000 | OROMUCOSAL | Status: DC | PRN
  Administered 2017-06-01: 1 via OROMUCOSAL
  Filled 2017-05-30: qty 177

## 2017-05-30 MED ORDER — LIDOCAINE 2% (20 MG/ML) 5 ML SYRINGE
INTRAMUSCULAR | Status: DC | PRN
  Administered 2017-05-30: 60 mg via INTRAVENOUS

## 2017-05-30 MED ORDER — POTASSIUM CHLORIDE CRYS ER 20 MEQ PO TBCR
20.0000 meq | EXTENDED_RELEASE_TABLET | Freq: Once | ORAL | Status: AC | PRN
  Administered 2017-05-30: 40 meq via ORAL
  Filled 2017-05-30: qty 2

## 2017-05-30 MED ORDER — MIDAZOLAM HCL 2 MG/2ML IJ SOLN
INTRAMUSCULAR | Status: AC
  Filled 2017-05-30: qty 2

## 2017-05-30 MED ORDER — LACTATED RINGERS IV SOLN
INTRAVENOUS | Status: DC | PRN
  Administered 2017-05-30: 07:00:00 via INTRAVENOUS

## 2017-05-30 MED ORDER — PROTAMINE SULFATE 10 MG/ML IV SOLN
INTRAVENOUS | Status: DC | PRN
  Administered 2017-05-30: 25 mg via INTRAVENOUS

## 2017-05-30 MED ORDER — HEPARIN SODIUM (PORCINE) 1000 UNIT/ML IJ SOLN
INTRAMUSCULAR | Status: DC | PRN
  Administered 2017-05-30: 5000 [IU] via INTRAVENOUS

## 2017-05-30 SURGICAL SUPPLY — 49 items
CANISTER SUCT 3000ML PPV (MISCELLANEOUS) ×2 IMPLANT
CLIP TI MEDIUM 24 (CLIP) ×2 IMPLANT
CLIP TI WIDE RED SMALL 24 (CLIP) ×2 IMPLANT
DERMABOND ADVANCED (GAUZE/BANDAGES/DRESSINGS) ×3
DERMABOND ADVANCED .7 DNX12 (GAUZE/BANDAGES/DRESSINGS) ×3 IMPLANT
ELECT BLADE 4.0 EZ CLEAN MEGAD (MISCELLANEOUS) ×2
ELECT REM PT RETURN 9FT ADLT (ELECTROSURGICAL) ×2
ELECTRODE BLDE 4.0 EZ CLN MEGD (MISCELLANEOUS) ×1 IMPLANT
ELECTRODE REM PT RTRN 9FT ADLT (ELECTROSURGICAL) ×1 IMPLANT
GLOVE BIO SURGEON STRL SZ 6.5 (GLOVE) ×4 IMPLANT
GLOVE BIO SURGEON STRL SZ7.5 (GLOVE) ×4 IMPLANT
GLOVE BIOGEL PI IND STRL 6.5 (GLOVE) ×1 IMPLANT
GLOVE BIOGEL PI INDICATOR 6.5 (GLOVE) ×1
GLOVE ECLIPSE 6.5 STRL STRAW (GLOVE) ×2 IMPLANT
GOWN STRL REUS W/ TWL LRG LVL3 (GOWN DISPOSABLE) ×2 IMPLANT
GOWN STRL REUS W/ TWL XL LVL3 (GOWN DISPOSABLE) ×1 IMPLANT
GOWN STRL REUS W/TWL LRG LVL3 (GOWN DISPOSABLE) ×2
GOWN STRL REUS W/TWL XL LVL3 (GOWN DISPOSABLE) ×1
GRAFT HEMASHIELD 14X7MM (Vascular Products) ×2 IMPLANT
INSERT FOGARTY 61MM (MISCELLANEOUS) ×2 IMPLANT
INSERT FOGARTY SM (MISCELLANEOUS) ×4 IMPLANT
KIT BASIN OR (CUSTOM PROCEDURE TRAY) ×2 IMPLANT
KIT ROOM TURNOVER OR (KITS) ×2 IMPLANT
LOOP VESSEL MINI RED (MISCELLANEOUS) ×2 IMPLANT
NS IRRIG 1000ML POUR BTL (IV SOLUTION) ×6 IMPLANT
PACK AORTA (CUSTOM PROCEDURE TRAY) ×2 IMPLANT
PAD ARMBOARD 7.5X6 YLW CONV (MISCELLANEOUS) ×4 IMPLANT
SUT MNCRL AB 4-0 PS2 18 (SUTURE) ×4 IMPLANT
SUT PDS AB 1 TP1 54 (SUTURE) ×4 IMPLANT
SUT PROLENE 3 0 SH 48 (SUTURE) ×4 IMPLANT
SUT PROLENE 3 0 SH DA (SUTURE) ×2 IMPLANT
SUT PROLENE 5 0 C 1 24 (SUTURE) ×4 IMPLANT
SUT PROLENE 6 0 CC (SUTURE) ×2 IMPLANT
SUT SILK 2 0 (SUTURE) ×1
SUT SILK 2 0 TIES 17X18 (SUTURE) ×1
SUT SILK 2 0SH CR/8 30 (SUTURE) ×2 IMPLANT
SUT SILK 2-0 18XBRD TIE 12 (SUTURE) ×1 IMPLANT
SUT SILK 2-0 18XBRD TIE BLK (SUTURE) ×1 IMPLANT
SUT SILK 3 0 (SUTURE) ×1
SUT SILK 3 0 TIES 17X18 (SUTURE) ×1
SUT SILK 3-0 18XBRD TIE 12 (SUTURE) ×1 IMPLANT
SUT SILK 3-0 18XBRD TIE BLK (SUTURE) ×1 IMPLANT
SUT VIC AB 2-0 CT1 27 (SUTURE) ×4
SUT VIC AB 2-0 CT1 TAPERPNT 27 (SUTURE) ×4 IMPLANT
SUT VIC AB 3-0 SH 27 (SUTURE) ×3
SUT VIC AB 3-0 SH 27X BRD (SUTURE) ×3 IMPLANT
TOWEL BLUE STERILE X RAY DET (MISCELLANEOUS) ×4 IMPLANT
TRAY FOLEY W/METER SILVER 16FR (SET/KITS/TRAYS/PACK) ×2 IMPLANT
WATER STERILE IRR 1000ML POUR (IV SOLUTION) ×4 IMPLANT

## 2017-05-30 NOTE — H&P (Signed)
Patient ID: Alison Lynch, female   DOB: 03/07/1961, 56 y.o.   MRN: [id number]  Reason for Consult: Re-evaluation (2 month f/u )   Referred by Hal MoralesGunter, Tara G, NP  Subjective:     HPI:  Alison Lynch is a 56 y.o. female is here for follow-up of her previous bilateral iliac artery stenting as well as left above-knee amputation for acute left lower extremity ischemia. She is progressing well and is now healed her bony of dictation the left. She does complain of coolness to her right foot. She has not had any wounds or rest pain to the right foot. She is hopeful to get a prosthesis soon. She does continue to smoke unfortunately does not take any antiplatelet or anticoagulants at this time. She is on statin drug.       Past Medical History:  Diagnosis Date  . Cervical myelopathy (HCC)   . CKD (chronic kidney disease) stage 3, GFR 30-59 ml/min   . Depression   . Dysphagia   . Hx of migraine headaches   . Hyperlipidemia   . Hypertension   . Hypothyroid   . Osteoporosis   . Panic attacks   . PTSD (post-traumatic stress disorder)    History reviewed. No pertinent family history.      Past Surgical History:  Procedure Laterality Date  . AMPUTATION Left 01/09/2017   Procedure: AMPUTATION ABOVE KNEE;  Surgeon: Maeola HarmanBrandon Christopher Mahum Betten, MD;  [facility];  Service: Vascular;  Laterality: Left;  . AORTOGRAM Left 01/08/2017   Procedure: Ultrasound Guided Cannulation Left Common Femoral Artery; Aortagram;  Surgeon: Maeola HarmanBrandon Christopher Drago Hammonds, MD;  [facility];  Service: Vascular;  Laterality: Left;  . ARTERY EXPLORATION Left 01/08/2017   Procedure: Left Common Femoral Artery Exploration;  Surgeon: Maeola HarmanBrandon Christopher Gopal Malter, MD;  [facility];  Service: Vascular;  Laterality: Left;  . EMBOLECTOMY Left 01/08/2017   Procedure: Left Lower Extremity Thromboembolectomy;  Surgeon: Maeola HarmanBrandon Christopher Forest Pruden, MD;  [facility];  Service: Vascular;  Laterality: Left;  .  ENDARTERECTOMY POPLITEAL Left 01/08/2017   Procedure: Left Popliteal Endarterectomy;  Surgeon: Maeola HarmanBrandon Christopher Mikai Meints, MD;  [facility];  Service: Vascular;  Laterality: Left;  . INSERTION OF ILIAC STENT Bilateral 01/08/2017   Procedure: Bilateral Common Iliac Stent and Left Popliteal Artery Stent;  Surgeon: Maeola HarmanBrandon Christopher Eual Lindstrom, MD;  [facility];  Service: Vascular;  Laterality: Bilateral;  . LOWER EXTREMITY ANGIOGRAM Bilateral 01/08/2017   Procedure: Bilateral Lower Extremity Angiogram;  Surgeon: Maeola HarmanBrandon Christopher Persia Lintner, MD;  [facility];  Service: Vascular;  Laterality: Bilateral;  . PATCH ANGIOPLASTY  01/08/2017   Procedure: Patch Angioplasty Left Popliteal Artery ;  Surgeon: Maeola HarmanBrandon Christopher Aadvika Konen, MD;  [facility];  Service: Vascular;;    Short Social History:      Social History  Substance Use Topics  . Smoking status: Current Every Day Smoker  . Smokeless tobacco: Never Used  . Alcohol use No        Allergies  Allergen Reactions  . Clindamycin/Lincomycin Rash          Current Outpatient Prescriptions  Medication Sig Dispense Refill  . atorvastatin (LIPITOR) 80 MG tablet Take 1 tablet (80 mg total) by mouth daily. 30 tablet 0  . busPIRone (BUSPAR) 10 MG tablet Take 10-15 mg by mouth See admin instructions. Take 1 and 1/2 tablets every morning and take 1 tablet at bedtime    . gabapentin (NEURONTIN) 400 MG capsule Take 1 capsule (400 mg total) by  mouth 3 (three) times daily. 90 capsule 5  . mirtazapine (REMERON) 15 MG tablet Take 15 mg by mouth at bedtime.    . nicotine (NICODERM CQ - DOSED IN MG/24 HR) 7 mg/24hr patch Place 1 patch (7 mg total) onto the skin daily. 28 patch 0  . sertraline (ZOLOFT) 50 MG tablet Take 1 tablet (50 mg total) by mouth daily. 30 tablet 0  . traMADol (ULTRAM) 50 MG tablet Take 1 tablet (50 mg total) by mouth daily. 30 tablet 5  . ALPRAZolam (XANAX) 0.25 MG tablet Take 1 tablet (0.25 mg total) by mouth 3 (three)  times daily as needed for anxiety. (Patient not taking: Reported on 04/22/2017) 30 tablet 0  . clopidogrel (PLAVIX) 75 MG tablet Take 1 tablet (75 mg total) by mouth daily. 30 tablet 11  . guaiFENesin (MUCINEX) 600 MG 12 hr tablet Take 1 tablet (600 mg total) by mouth 2 (two) times daily. (Patient not taking: Reported on 04/22/2017) 30 tablet 0  . HYDROcodone-acetaminophen (NORCO/VICODIN) 5-325 MG tablet Take 1-2 tablets by mouth every 4 (four) hours as needed for severe pain. (Patient not taking: Reported on 04/22/2017) 30 tablet 0  . levothyroxine (SYNTHROID, LEVOTHROID) 50 MCG tablet Take 1 tablet (50 mcg total) by mouth daily before breakfast. (Patient not taking: Reported on 04/22/2017) 30 tablet 1  . methocarbamol (ROBAXIN) 750 MG tablet Take 1 tablet (750 mg total) by mouth every 8 (eight) hours as needed for muscle spasms. (Patient not taking: Reported on 04/22/2017) 60 tablet 0  . pantoprazole (PROTONIX) 40 MG tablet Take 1 tablet (40 mg total) by mouth daily. (Patient not taking: Reported on 04/22/2017) 30 tablet 0   No current facility-administered medications for this visit.     Review of Systems  Constitutional: Positive for chills, diaphoresis and fever.  HENT: HENT negative.  Eyes: Eyes negative.  Respiratory: Respiratory negative.  Cardiovascular: Cardiovascular negative.  GI: Gastrointestinal negative.  Musculoskeletal: Positive for leg pain.  Neurological: Neurological negative. Hematologic: Hematologic/lymphatic negative.  Psychiatric: Positive for depressed mood.        Objective:  Objective      Vitals:   04/22/17 1039  BP: (!) 84/56  Pulse: 98  Resp: 18  Temp: 98.7 F (37.1 C)  TempSrc: Oral  SpO2: 98%  Weight: 114 lb (51.7 kg)  Height: 5' (1.524 m)   Body mass index is 22.26 kg/m.  Physical Exam  Constitutional: She appears well-developed.  HENT:  Head: Normocephalic.  Eyes: Pupils are equal, round, and reactive to light.  Neck: Normal range  of motion.  Cardiovascular: Normal rate.   Venous signals in right foot  Pulmonary/Chest: Effort normal.  Abdominal: Soft. She exhibits no mass.  Musculoskeletal:  Well healed left aka  Neurological: She is alert.  Skin: Skin is warm.  Psychiatric: She has a normal mood and affect. Her behavior is normal. Judgment and thought content normal.    Data: I have independently reviewed her duplex of her bilateral common iliac artery stents. The right-sided stent demonstrates no flow monophasic common femoral artery flow system with occlusion. Her ABI is absent on the right.     Assessment/Plan:  56yo wf with history of kissing iliac stents with left lower extremity embolectomy that resulted in left above-the-knee amputation that is well healed now. She underwent attempted angiogram with occluded distal aorta now indicated for Aortobifemoral bypass for rle limb salvage.   Maeola Harman MD [facility]

## 2017-05-30 NOTE — Op Note (Signed)
Patient name: Alison Lynch MRN: [id number] DOB: 05/07/1961 Sex: female  05/30/2017 Pre-operative Diagnosis: occluded aorta, critical right lower extremity ischemia Post-operative diagnosis:  Same Surgeon:  Luanna SalkBrandon C. Randie Heinzain, MD Assistant: Doreatha MassedSamantha Rhyne, PA Procedure Performed: Aortobifemoral bypass grafting with 14-727mm dacron  Indications:  56 year old female has a history of bilateral common iliac artery stenting that are now both occluded. She also has a left above-the-knee amputation has rest pain in the right lower extremity. She is now indicated for aortobifemoral bypass grafting.  Findings: Aorta was diminutive measured 14 mm. There was minimal flow her bilateral common femoral arteries prior to grafting. The graft was configured end to end anastomosis. At completion there was a multiphasic flow in the bilateral superficial femoral and profunda femoris arteries and a right dorsalis pedis and posterior tibial artery signal.   Procedure:  The patient was identified in the holding area and taken to the operating room where she was placed supine on the operating table general endotracheal anesthesia and monitoring lines were placed and she was given antibiotics. She was then sterilely prepped and draped in the usual fashion for aortobifemoral bypass grafting. Following this we began with incision in the left groin to side of her previous exposure. Dissected through significant scar tissue until identified the common femoral artery on the left. We then dissected out the external iliac artery dividing the crossing vein. We then tediously dissected out the profunda femoris superficial femoral arteries as well as multiple branch vessels and placed vessel loops around this. Turned our attention to the right groin made a mirror image incision was longitudinal. We dissected down and encircle the common femoral artery followed by profunda femoris superficial femoral arteries with vessel loops. We did divide  the crossing vein above the external iliac artery. We then made a midline incision the abdomen that was periumbilical and dissected down through skin spontaneous tissue to the fashion and divided this. We then protected the intestine throughout his course I extended our incision caudally followed by cephalad where she had a previous incision. We did have to take the stomach down from the anterior wall the site of her previous incision protected this. Judgment to put one figure-of-eight Vicryl stitch where there was a serosal tear in the stomach. We then confirmed NG tube replacement. The Omni retractor was put in place the small test was reflected to the right colon and stomach were retracted cephalad. Then divided the retroperitoneum over our aorta identified our renal vein as well as our bilateral renal arteries. We then dissected down onto the bifurcation of the aorta protected our nerves to the patient's left. We used a right angle retractor placed an umbilical tape around the proximal aorta where was pulsatile just distal to our renal arteries. Tunnels were then created between the bilateral groins and retroperitoneum. This time the patient was heparinized with 5000 units of heparin. We measured the neck aorta beat 14 mm we then brought a 14 x 7 mm graft table. The aorta was clamped just below the right renal artery and transected. We did have adequate backbleeding we did trim some of our aorta out and oversewed the distal aorta with 3-0 Prolene suture. We then beveled our dacryon graft and sewed it end-to-end with 3-0 Prolene suture. Prior to completing the anastomosis we did make sure it was tight we then completed the anastomosis clamped both limbs of the graft and released our proximal clamp. No repair sutures were needed. We then re-clamped our graft proximal aspect  flushed both limbs which were then tunneled to each groin. The bowel retractors were relaxed. We began with the right groin were reopened the  common femoral artery trimmed our graft to size and sewn end to side with 5-0 Prolene suture. Prior to completing we did flush through both the graft and allowed backbleeding from our leg. We then completed our anastomosis and notified anesthesia prior to opening the clamps. She did tolerate the unclamping fine. Internal attention to the left where similarly we clamped proximally and distally opened our common femoral trimmed our graft to size and sewed end to side with again 5-0 Prolene suture. We did have adequate backbleeding from the profunda femoris artery. Then completed the anastomosis after flushing through our graft. Doppler confirmed strong signals in our bilateral profunda femoris superficial femoral arteries. Satisfied with this we rechecked our proximal aortic anastomosis and inspected all of our intestine including the sigmoid colon all appeared to be perfused well. Patient was then given 30 mg protamine we closed the retroperitoneum over the bypass graft and then closed the fascia with #1 PDS suture after relying our omentum below the incision. The skin of the abdominal incision was then closed with 4-0 Monocryl. Attention was then turned to bilateral groins which were irrigated and closed concomitantly with 2-0 Vicryl, 3-0 Vicryl and 4-0 Monocryl. Signals of the right foot were checked and noted to be DP and PT patient was then allowed awaken from anesthesia having tolerated the procedure well. All counts were correct at completion.    250 mL blood loss.    Ignacio Lowder C. Randie Heinz, MD [facility] [place] Office: [phone] Pager: [phone]

## 2017-05-30 NOTE — Transfer of Care (Signed)
Immediate Anesthesia Transfer of Care Note  Patient: Alison Lynch  Procedure(s) Performed: Procedure(s): AORTA BIFEMORAL BYPASS GRAFT USING 14X7MMX40CM HEMASHIELD GOLD GRAFT (N/A)  Patient Location: PACU  Anesthesia Type:General  Level of Consciousness: awake, alert  and oriented  Airway & Oxygen Therapy: Patient Spontanous Breathing and Patient connected to nasal cannula oxygen  Post-op Assessment: Report given to RN and Post -op Vital signs reviewed and stable  Post vital signs: Reviewed and stable  Last Vitals:  Vitals:   05/30/17 0559  BP: (!) 109/44  Pulse: 72  Resp: 20  Temp: 36.8 C   H 89, bP 124/74, RR 19, sats 100 Last Pain:  Vitals:   05/30/17 0642  TempSrc:   PainSc: 7       Patients Stated Pain Goal: 3 (05/30/17 [id number])  Complications: No apparent anesthesia complications

## 2017-05-30 NOTE — Anesthesia Procedure Notes (Signed)
Arterial Line Insertion Start/End7/03/2017 7:19 AM, 06/12/2017 7:19 AM Performed by: Waynard EdwardsSMITH, Pranit Owensby A, CRNA  Patient location: Pre-op. Preanesthetic checklist: patient identified, IV checked, site marked, risks and benefits discussed, surgical consent, monitors and equipment checked and pre-op evaluation Lidocaine 1% used for infiltration radial was placed Catheter size: 20 G Hand hygiene performed  and maximum sterile barriers used  Allen's test indicative of satisfactory collateral circulation Attempts: 1 Procedure performed without using ultrasound guided technique. Following insertion, dressing applied and Biopatch. Post procedure assessment: normal and unchanged  Patient tolerated the procedure well with no immediate complications.

## 2017-05-30 NOTE — Anesthesia Postprocedure Evaluation (Signed)
Anesthesia Post Note  Patient: Alison Lynch  Procedure(s) Performed: Procedure(s) (LRB): AORTA BIFEMORAL BYPASS GRAFT USING 14X7MMX40CM HEMASHIELD GOLD GRAFT (N/A)     Patient location during evaluation: PACU Anesthesia Type: General Level of consciousness: awake and alert Pain management: pain level controlled Vital Signs Assessment: post-procedure vital signs reviewed and stable Respiratory status: spontaneous breathing, nonlabored ventilation, respiratory function stable and patient connected to nasal cannula oxygen Cardiovascular status: blood pressure returned to baseline and stable Postop Assessment: no signs of nausea or vomiting Anesthetic complications: no    Last Vitals:  Vitals:   05/30/17 0559 05/30/17 1148  BP: (!) 109/44   Pulse: 72   Resp: 20   Temp: 36.8 C 36.6 C    Last Pain:  Vitals:   05/30/17 1148  TempSrc:   PainSc: 7                  Brytni Dray S

## 2017-05-30 NOTE — Anesthesia Preprocedure Evaluation (Signed)
Anesthesia Evaluation  Patient identified by MRN, date of birth, ID band Patient awake    Reviewed: Allergy & Precautions, NPO status , Patient's Chart, lab work & pertinent test results  Airway Mallampati: II  TM Distance: >3 FB Neck ROM: Full    Dental no notable dental hx.    Pulmonary neg pulmonary ROS, Current Smoker,    Pulmonary exam normal breath sounds clear to auscultation       Cardiovascular hypertension, + Peripheral Vascular Disease  Normal cardiovascular exam Rhythm:Regular Rate:Normal     Neuro/Psych negative neurological ROS  negative psych ROS   GI/Hepatic negative GI ROS, Neg liver ROS,   Endo/Other  Hypothyroidism   Renal/GU negative Renal ROS  negative genitourinary   Musculoskeletal negative musculoskeletal ROS (+)   Abdominal   Peds negative pediatric ROS (+)  Hematology negative hematology ROS (+)   Anesthesia Other Findings   Reproductive/Obstetrics negative OB ROS                             Anesthesia Physical Anesthesia Plan  ASA: III  Anesthesia Plan: General   Post-op Pain Management:    Induction: Intravenous  PONV Risk Score and Plan: 2 and Ondansetron, Dexamethasone and Propofol  Airway Management Planned: Oral ETT  Additional Equipment: Arterial line and CVP  Intra-op Plan:   Post-operative Plan: Possible Post-op intubation/ventilation  Informed Consent: I have reviewed the patients History and Physical, chart, labs and discussed the procedure including the risks, benefits and alternatives for the proposed anesthesia with the patient or authorized representative who has indicated his/her understanding and acceptance.   Dental advisory given  Plan Discussed with: CRNA and Surgeon  Anesthesia Plan Comments:         Anesthesia Quick Evaluation

## 2017-05-30 NOTE — Plan of Care (Signed)
Problem: Bowel/Gastric: Goal: Will not experience complications related to bowel motility Outcome: Not Progressing Patient experiencing nausea

## 2017-05-30 NOTE — Progress Notes (Signed)
  Day of Surgery Note    Subjective:  "I'm thirsty"   Vitals:   05/30/17 0559 05/30/17 1148  BP: (!) 109/44   Pulse: 72   Resp: 20   Temp: 98.2 F (36.8 C) 97.8 F (36.6 C)    Incisions:   All incisions are clean and dry Extremities:  + doppler signals right PT>DP Cardiac:  regular Lungs:  Non labored Abdomen:  Mildly distended (same as when she was in OR)   Assessment/Plan:  This is a 56 y.o. female who is s/p aortobifemoral bypass grafting   -pt with good doppler flow to right foot -continue strict npo -BP normal at this point-if she becomes more hypertensive, will start scheduled IV metoprolol -Morphine PCA for pain control -to 2H later today   Doreatha MassedSamantha Vashaun Osmon, PA-C 05/30/2017 11:58 AM [phone]

## 2017-05-30 NOTE — Anesthesia Procedure Notes (Signed)
Central Venous Catheter Insertion Performed by: Eilene GhaziOSE, Asti Mackley, anesthesiologist Start/End7/12/2016 7:11 AM, 05/30/2017 7:24 AM Patient location: Pre-op. Preanesthetic checklist: patient identified, IV checked, site marked, risks and benefits discussed, surgical consent, monitors and equipment checked, pre-op evaluation, timeout performed and anesthesia consent Position: Trendelenburg Lidocaine 1% used for infiltration and patient sedated Hand hygiene performed , maximum sterile barriers used  and Seldinger technique used Catheter size: 8.5 Fr Total catheter length 10. Central line was placed.Sheath introducer Swan type:thermodilution PA Cath depth:50 Procedure performed using ultrasound guided technique. Ultrasound Notes:anatomy identified, needle tip was noted to be adjacent to the nerve/plexus identified, no ultrasound evidence of intravascular and/or intraneural injection and image(s) printed for medical record Attempts: 1 Following insertion, line sutured and dressing applied. Post procedure assessment: blood return through all ports, free fluid flow and no air  Patient tolerated the procedure well with no immediate complications. Additional procedure comments: Attempt x 1 on R, very small vessel, unable to pass guidewire.

## 2017-05-30 NOTE — Anesthesia Procedure Notes (Signed)
Procedure Name: Intubation Date/Time: 05/30/2017 7:48 AM Performed by: Annabelle HarmanSMITH, Parv Manthey A Pre-anesthesia Checklist: Patient identified, Emergency Drugs available, Suction available and Patient being monitored Patient Re-evaluated:Patient Re-evaluated prior to inductionOxygen Delivery Method: Circle system utilized Preoxygenation: Pre-oxygenation with 100% oxygen Intubation Type: IV induction Ventilation: Mask ventilation without difficulty Laryngoscope Size: Glidescope and 3 Grade View: Grade I Tube type: Subglottic suction tube Tube size: 7.0 mm Number of attempts: 1 Airway Equipment and Method: Rigid stylet and Video-laryngoscopy Placement Confirmation: ETT inserted through vocal cords under direct vision,  positive ETCO2 and breath sounds checked- equal and bilateral Secured at: 21 cm Tube secured with: Tape Dental Injury: Teeth and Oropharynx as per pre-operative assessment  Difficulty Due To: Difficulty was anticipated, Difficult Airway- due to reduced neck mobility and Difficult Airway- due to dentition Comments: DL x 1 with glidescope 3.  Grade 1 view.  EBBS and VSS.  Decision made to proceed with glidescope intubation from previous anesthetic records.

## 2017-05-31 ENCOUNTER — Encounter (HOSPITAL_COMMUNITY): Payer: Self-pay | Admitting: Vascular Surgery

## 2017-05-31 ENCOUNTER — Inpatient Hospital Stay (HOSPITAL_COMMUNITY): Payer: Medicare HMO

## 2017-05-31 LAB — BPAM RBC
BLOOD PRODUCT EXPIRATION DATE: 201807112359
Blood Product Expiration Date: 201807112359
ISSUE DATE / TIME: 201807021055
ISSUE DATE / TIME: 201807021256
UNIT TYPE AND RH: 6200
Unit Type and Rh: 6200

## 2017-05-31 LAB — TYPE AND SCREEN
ABO/RH(D): A POS
Antibody Screen: NEGATIVE
UNIT DIVISION: 0
Unit division: 0

## 2017-05-31 LAB — COMPREHENSIVE METABOLIC PANEL
ALBUMIN: 3.3 g/dL — AB (ref 3.5–5.0)
ALK PHOS: 42 U/L (ref 38–126)
ALT: 21 U/L (ref 14–54)
ALT: 22 U/L (ref 14–54)
AST: 34 U/L (ref 15–41)
AST: 31 U/L (ref 15–41)
Albumin: 3.1 g/dL — ABNORMAL LOW (ref 3.5–5.0)
Alkaline Phosphatase: 45 U/L (ref 38–126)
Anion gap: 9 (ref 5–15)
Anion gap: 9 (ref 5–15)
BILIRUBIN TOTAL: 0.8 mg/dL (ref 0.3–1.2)
BUN: 11 mg/dL (ref 6–20)
BUN: 13 mg/dL (ref 6–20)
CHLORIDE: 105 mmol/L (ref 101–111)
CO2: 21 mmol/L — ABNORMAL LOW (ref 22–32)
CO2: 20 mmol/L — ABNORMAL LOW (ref 22–32)
Calcium: 7.8 mg/dL — ABNORMAL LOW (ref 8.9–10.3)
Calcium: 7.6 mg/dL — ABNORMAL LOW (ref 8.9–10.3)
Chloride: 105 mmol/L (ref 101–111)
Creatinine, Ser: 1.07 mg/dL — ABNORMAL HIGH (ref 0.44–1.00)
Creatinine, Ser: 1.24 mg/dL — ABNORMAL HIGH (ref 0.44–1.00)
GFR calc Af Amer: >60 mL/min (ref >60)
GFR calc non Af Amer: 58 mL/min — ABNORMAL LOW (ref >60)
GFR, EST AFRICAN AMERICAN: 56 mL/min — AB (ref >60)
GFR, EST NON AFRICAN AMERICAN: 48 mL/min — AB (ref >60)
Glucose, Bld: 170 mg/dL — ABNORMAL HIGH (ref 65–99)
Glucose, Bld: 180 mg/dL — ABNORMAL HIGH (ref 65–99)
POTASSIUM: 5.6 mmol/L — AB (ref 3.5–5.1)
Potassium: 5.8 mmol/L — ABNORMAL HIGH (ref 3.5–5.1)
Sodium: 135 mmol/L (ref 135–145)
Sodium: 134 mmol/L — ABNORMAL LOW (ref 135–145)
TOTAL PROTEIN: 5.3 g/dL — AB (ref 6.5–8.1)
Total Bilirubin: 0.8 mg/dL (ref 0.3–1.2)
Total Protein: 5.3 g/dL — ABNORMAL LOW (ref 6.5–8.1)

## 2017-05-31 LAB — BASIC METABOLIC PANEL
Anion gap: 13 (ref 5–15)
Anion gap: 9 (ref 5–15)
BUN: 16 mg/dL (ref 6–20)
BUN: 14 mg/dL (ref 6–20)
CALCIUM: 7.6 mg/dL — AB (ref 8.9–10.3)
CALCIUM: 7 mg/dL — AB (ref 8.9–10.3)
CHLORIDE: 105 mmol/L (ref 101–111)
CO2: 19 mmol/L — ABNORMAL LOW (ref 22–32)
CO2: 20 mmol/L — AB (ref 22–32)
CREATININE: 1.72 mg/dL — AB (ref 0.44–1.00)
CREATININE: 1.45 mg/dL — AB (ref 0.44–1.00)
Chloride: 103 mmol/L (ref 101–111)
GFR calc Af Amer: 46 mL/min — ABNORMAL LOW (ref >60)
GFR calc non Af Amer: 33 mL/min — ABNORMAL LOW (ref >60)
GFR calc non Af Amer: 40 mL/min — ABNORMAL LOW (ref >60)
GFR, EST AFRICAN AMERICAN: 38 mL/min — AB (ref >60)
GLUCOSE: 136 mg/dL — AB (ref 65–99)
Glucose, Bld: 175 mg/dL — ABNORMAL HIGH (ref 65–99)
Potassium: 4.6 mmol/L (ref 3.5–5.1)
Potassium: 4.9 mmol/L (ref 3.5–5.1)
SODIUM: 135 mmol/L (ref 135–145)
Sodium: 134 mmol/L — ABNORMAL LOW (ref 135–145)

## 2017-05-31 LAB — CBC
HCT: 43.8 % (ref 36.0–46.0)
HCT: 41.7 % (ref 36.0–46.0)
HEMOGLOBIN: 14.5 g/dL (ref 12.0–15.0)
Hemoglobin: 13.7 g/dL (ref 12.0–15.0)
MCH: 29 pg (ref 26.0–34.0)
MCH: 29.1 pg (ref 26.0–34.0)
MCHC: 33.1 g/dL (ref 30.0–36.0)
MCHC: 32.9 g/dL (ref 30.0–36.0)
MCV: 87.6 fL (ref 78.0–100.0)
MCV: 88.5 fL (ref 78.0–100.0)
PLATELETS: 167 10*3/uL (ref 150–400)
Platelets: 177 10*3/uL (ref 150–400)
RBC: 5 MIL/uL (ref 3.87–5.11)
RBC: 4.71 MIL/uL (ref 3.87–5.11)
RDW: 17.5 % — ABNORMAL HIGH (ref 11.5–15.5)
RDW: 17.6 % — ABNORMAL HIGH (ref 11.5–15.5)
WBC: 10.8 10*3/uL — ABNORMAL HIGH (ref 4.0–10.5)
WBC: 12.9 10*3/uL — ABNORMAL HIGH (ref 4.0–10.5)

## 2017-05-31 LAB — AMYLASE: Amylase: 674 U/L — ABNORMAL HIGH (ref 28–100)

## 2017-05-31 LAB — MAGNESIUM: Magnesium: 2 mg/dL (ref 1.7–2.4)

## 2017-05-31 MED ORDER — ORAL CARE MOUTH RINSE
15.0000 mL | Freq: Two times a day (BID) | OROMUCOSAL | Status: DC
  Administered 2017-05-31 – 2017-06-05 (×10): 15 mL via OROMUCOSAL

## 2017-05-31 MED ORDER — LORAZEPAM 2 MG/ML IJ SOLN
1.0000 mg | Freq: Four times a day (QID) | INTRAMUSCULAR | Status: DC | PRN
  Administered 2017-05-31 – 2017-06-04 (×7): 1 mg via INTRAVENOUS
  Filled 2017-05-31 (×7): qty 1

## 2017-05-31 MED ORDER — CHLORHEXIDINE GLUCONATE CLOTH 2 % EX PADS: 6.0000 | MEDICATED_PAD | Freq: Every day | CUTANEOUS | Status: DC

## 2017-05-31 MED ORDER — SODIUM CHLORIDE 0.9 % IV SOLN
500.0000 mL | Freq: Once | INTRAVENOUS | Status: AC
  Administered 2017-05-31: 500 mL via INTRAVENOUS

## 2017-05-31 MED ORDER — SODIUM CHLORIDE 0.9% FLUSH: 10.0000 mL | Freq: Two times a day (BID) | INTRAVENOUS | Status: DC

## 2017-05-31 MED ORDER — LORAZEPAM 2 MG/ML IJ SOLN: 1.0000 mg | Freq: Four times a day (QID) | INTRAMUSCULAR | Status: DC

## 2017-05-31 MED ORDER — CHLORHEXIDINE GLUCONATE 0.12 % MT SOLN
15.0000 mL | Freq: Two times a day (BID) | OROMUCOSAL | Status: DC
  Administered 2017-05-31 – 2017-06-05 (×12): 15 mL via OROMUCOSAL
  Filled 2017-05-31 (×10): qty 15

## 2017-05-31 MED ORDER — SODIUM CHLORIDE 0.9% FLUSH: 10.0000 mL | INTRAVENOUS | Status: DC | PRN

## 2017-05-31 MED FILL — Heparin Sodium (Porcine) Inj 1000 Unit/ML: INTRAMUSCULAR | Qty: 30 | Status: AC

## 2017-05-31 MED FILL — Sodium Chloride IV Soln 0.9%: INTRAVENOUS | Qty: 1000 | Status: AC

## 2017-05-31 NOTE — Care Management Note (Addendum)
Case Management Note Donn PieriniKristi Monigue Spraggins RN, BSN Unit 2W-Case Manager-- 2H coverage [phone]  Patient Details  Name: Milton Fergusonammy Coryell MRN: [id number] Date of Birth: 01/16/1961  Subjective/Objective:  Pt admitted s/p  Aortobifemoral bypass grafting on 05/30/17                  Action/Plan: PTA pt lived at home with spouse- anticipate return home- CM to follow post op progress for d/c needs-- PT/OT evals pending  Expected Discharge Date:                  Expected Discharge Plan:  Home/Self Care  In-House Referral:     Discharge planning Services  CM Consult  Post Acute Care Choice:    Choice offered to:     DME Arranged:    DME Agency:     HH Arranged:    HH Agency:     Status of Service:  In process, will continue to follow  If discussed at Long Length of Stay Meetings, dates discussed:    Discharge Disposition:   Additional Comments:  Darrold SpanWebster, Dmya Long Hall, RN 05/31/2017, 11:46 AM

## 2017-05-31 NOTE — Progress Notes (Signed)
  Progress Note    05/31/2017 8:08 AM 1 Day Post-Op  Subjective:  Having abdominal pain  Vitals:   05/31/17 0600 05/31/17 0700  BP: 93/61 95/69  Pulse: 92 95  Resp: (!) 26 (!) 26  Temp:  98.3 F (36.8 C)    Physical Exam: aaox3 Abdomen is appropriately ttp Strong right dp/pt signals Incisions cdi  CBC    Component Value Date/Time   WBC 12.4 (H) 05/30/2017 1225   RBC 2.62 (L) 05/30/2017 1225   HGB 7.6 (L) 05/30/2017 1225   HCT 24.1 (L) 05/30/2017 1225   PLT 197 05/30/2017 1225   MCV 92.0 05/30/2017 1225   MCH 29.0 05/30/2017 1225   MCHC 31.5 05/30/2017 1225   RDW 14.6 05/30/2017 1225   LYMPHSABS 1.6 01/14/2017 0514   MONOABS 0.9 01/14/2017 0514   EOSABS 0.5 01/14/2017 0514   BASOSABS 0.0 01/14/2017 0514    BMET    Component Value Date/Time   NA 135 05/31/2017 0413   K 5.6 (H) 05/31/2017 0413   CL 105 05/31/2017 0413   CO2 21 (L) 05/31/2017 0413   GLUCOSE 170 (H) 05/31/2017 0413   BUN 11 05/31/2017 0413   CREATININE 1.07 (H) 05/31/2017 0413   CALCIUM 7.8 (L) 05/31/2017 0413   GFRNONAA 58 (L) 05/31/2017 0413   GFRAA >60 05/31/2017 0413    INR    Component Value Date/Time   INR 1.17 05/30/2017 1225     Intake/Output Summary (Last 24 hours) at 05/31/17 [id number] Last data filed at 05/31/17 0700  Gross per 24 hour  Intake          5020.75 ml  Output             1940 ml  Net          3080.75 ml     Assessment:  56 y.o. female is pod#1 AoBF  Plan: Ok for transfer to 4 east Recheck bmp Out of bed today subq heparin and scd for ppx   KemahBrandon C. Randie Heinzain, MD [facility] [place] Office: [phone] Pager: [phone]  05/31/2017 8:08 AM

## 2017-05-31 NOTE — Progress Notes (Signed)
Dr. Randie Heinzain notified of lab results and low urine output ( 10-15 cc per hour).  Will continue to monitor pt closely.

## 2017-05-31 NOTE — Evaluation (Signed)
Physical Therapy Evaluation Patient Details Name: Alison Lynch MRN: [id number] DOB: [birth year] Today's Date: 05/31/2017   History of Present Illness   56 year old female has a history of bilateral common iliac artery stenting that are now both occluded. She also has a left above-the-knee amputation has rest pain in the right lower extremity. She was indicated for aortobifemoral bypass grafting of which has been completed. PMH:  depression, Migraines, HTN, PTSD, Bil iliac stents.    Clinical Impression  Pt admitted with above diagnosis. Pt currently with functional limitations due to the deficits listed below (see PT Problem List). Pt was able to transfer to chair with +2 mod assist squat pivot.  Independent with wheelchair transfers PTA.  She states husband can assist at home.  Will follow acutely.  Pt will benefit from skilled PT to increase their independence and safety with mobility to allow discharge to the venue listed below.      Follow Up Recommendations Supervision/Assistance - 24 hour;Home health PT    Equipment Recommendations  Other (comment) (TBA)    Recommendations for Other Services       Precautions / Restrictions Precautions Precautions: Fall Restrictions Weight Bearing Restrictions: No      Mobility  Bed Mobility Overal bed mobility: Needs Assistance Bed Mobility: Supine to Sit     Supine to sit: Mod assist;+2 for physical assistance     General bed mobility comments: Pt was able to come to EOB but needed mod assist due to pain and weakness.  Used pad to scoot pt to EOB.   Transfers Overall transfer level: Needs assistance Equipment used: None Transfers: Sit to/from [employer] Sit to Stand: Mod assist;Min assist;+2 safety/equipment   Squat pivot transfers: Mod assist;Min assist;+2 safety/equipment     General transfer comment: Pt was able to squat pivot to chair with assist to power up.  Pt held onto PT and tech's UEs to squat pivot around  to chair.    Ambulation/Gait             General Gait Details: unable at this time  Stairs            Wheelchair Mobility    Modified Rankin (Stroke Patients Only)       Balance Overall balance assessment: Needs assistance;History of Falls Sitting-balance support: No upper extremity supported;Feet supported Sitting balance-Leahy Scale: Fair     Standing balance support: Bilateral upper extremity supported;During functional activity Standing balance-Leahy Scale: Poor Standing balance comment: relies on Bil UE support and could not stand fully upright.                             Pertinent Vitals/Pain Pain Assessment: Faces Faces Pain Scale: Hurts whole lot Pain Location: groin area bilaterally Pain Descriptors / Indicators: Aching;Grimacing;Guarding;Sore Pain Intervention(s): Limited activity within patient's tolerance;Monitored during session;Premedicated before session;Repositioned  97-101 bpm, 93% RA, 95/46  Home Living Family/patient expects to be discharged to:: Private residence Living Arrangements: Spouse/significant other Available Help at Discharge: Family;Available 24 hours/day Type of Home: House Home Access: Level entry     Home Layout: One level Home Equipment: Grab bars - tub/shower;Cane - single point;Walker - 2 wheels;Wheelchair - manual;Shower seat Additional Comments: Should be eligible for prosthesis soon.     Prior Function Level of Independence: Independent with assistive device(s)         Comments: Pt states she was independent with wheelchair transfers but then says husband stood by for safety.  She also states she could bathe and dress self but that husband was close by.  She did admit she slid off the bed at home once since the left AKA.      Hand Dominance   Dominant Hand: Right    Extremity/Trunk Assessment   Upper Extremity Assessment Upper Extremity Assessment: Defer to OT evaluation    Lower Extremity  Assessment Lower Extremity Assessment: RLE deficits/detail;LLE deficits/detail RLE Deficits / Details: grossly 2+/5 LLE Deficits / Details: grossly 2+/5 hip    Cervical / Trunk Assessment Cervical / Trunk Assessment: Normal  Communication   Communication: No difficulties  Cognition Arousal/Alertness: Awake/alert Behavior During Therapy: Anxious Overall Cognitive Status: Within Functional Limits for tasks assessed                                        General Comments      Exercises General Exercises - Lower Extremity Ankle Circles/Pumps: AROM;Right;5 reps;Seated Long Arc Quad: AROM;Right;5 reps;Seated Amputee Exercises Hip Flexion/Marching: AROM;Left;5 reps;Seated   Assessment/Plan    PT Assessment Patient needs continued PT services  PT Problem List Decreased strength;Decreased range of motion;Decreased activity tolerance;Decreased balance;Decreased mobility;Decreased safety awareness;Decreased knowledge of use of DME;Decreased knowledge of precautions;Pain       PT Treatment Interventions DME instruction;Gait training;Functional mobility training;Therapeutic activities;Therapeutic exercise;Balance training;Patient/family education    PT Goals (Current goals can be found in the Care Plan section)  Acute Rehab PT Goals Patient Stated Goal: to get better PT Goal Formulation: With patient Time For Goal Achievement: 06/14/17 Potential to Achieve Goals: Good    Frequency Min 3X/week   Barriers to discharge        Co-evaluation               AM-PAC PT "6 Clicks" Daily Activity  Outcome Measure Difficulty turning over in bed (including adjusting bedclothes, sheets and blankets)?: Total Difficulty moving from lying on back to sitting on the side of the bed? : Total Difficulty sitting down on and standing up from a chair with arms (e.g., wheelchair, bedside commode, etc,.)?: A Lot Help needed moving to and from a bed to chair (including a  wheelchair)?: A Lot Help needed walking in hospital room?: Total Help needed climbing 3-5 steps with a railing? : Total 6 Click Score: 8    End of Session Equipment Utilized During Treatment: Gait belt Activity Tolerance: Patient limited by fatigue;Patient limited by pain Patient left: in chair;with call bell/phone within reach;with chair alarm set Nurse Communication: Mobility status;Patient requests pain meds PT Visit Diagnosis: Unsteadiness on feet (R26.81);Muscle weakness (generalized) (M62.81);History of falling (Z91.81);Pain Pain - Right/Left: Right Pain - part of body: Hip (groin, abdomen)    Time: [id number] PT Time Calculation (min) (ACUTE ONLY): 14 min   Charges:   PT Evaluation $PT Eval Moderate Complexity: 1 Procedure     PT G Codes:        Aarik Blank,PT Acute Rehabilitation [phone] (pager)   Berline Lopesawn F Travaris Kosh 05/31/2017, 12:08 PM

## 2017-06-01 NOTE — Progress Notes (Signed)
Pt transferred to 2W room 31. Family made aware.

## 2017-06-01 NOTE — Progress Notes (Addendum)
   VASCULAR SURGERY ASSESSMENT & PLAN:   2 Days Post-Op s/p: Aortobifemoral bypass graft. Good Doppler signals right foot.  GI: NG tube with 300 cc of drainage last shift. States that she has had a bowel movement. She is passing flatus. She's had some nausea but also states that she is hungry. I will remove her NG tube and keep her nothing by mouth for now.  RENAL: Urine output is marginal. Creatinine yesterday was 1.4. Will gently hydrate and follow up B met tomorrow.  Increase activity. (has a left AKA)  Transfer to 2 W.  IS  On Lovenox for DVT prophylaxis.  Ween PCA and start po pain meds tomorrow.    SUBJECTIVE:   Passing flatus. Some nausea. But also states that she is hungry. He has been out of bed to the chair.  PHYSICAL EXAM:   Vitals:   06/01/17 0100 06/01/17 0255 06/01/17 0700 06/01/17 0800  BP:  100/62 (!) 100/48   Pulse:  (!) 120 (!) 114   Resp: 18 (!) '23 15 19  '$ Temp:  98.5 F (36.9 C) 98.4 F (36.9 C)   TempSrc:  Oral Oral   SpO2: 96% 94% 96% 96%  Weight:      Height:       Brisk Doppler signals right foot. Incisions look fine.  LABS:   Lab Results  Component Value Date   WBC 12.9 (H) 05/31/2017   HGB 13.7 05/31/2017   HCT 41.7 05/31/2017   MCV 88.5 05/31/2017   PLT 167 05/31/2017   Lab Results  Component Value Date   CREATININE 1.45 (H) 05/31/2017   Lab Results  Component Value Date   INR 1.17 05/30/2017    PROBLEM LIST:    Active Problems:   Aortoiliac occlusive disease (HCC)   CURRENT MEDS:   . chlorhexidine  15 mL Mouth Rinse BID  . docusate sodium  100 mg Oral Daily  . enoxaparin (LOVENOX) injection  30 mg Subcutaneous Q24H  . mouth rinse  15 mL Mouth Rinse q12n4p  . morphine   Intravenous Q4H  . nicotine  7 mg Transdermal Daily  . pantoprazole (PROTONIX) IV  40 mg Intravenous QHS    Gae Gallop Beeper: [phone] Office: [phone] 06/01/2017

## 2017-06-02 LAB — BASIC METABOLIC PANEL
Anion gap: 6 (ref 5–15)
BUN: 15 mg/dL (ref 6–20)
CO2: 20 mmol/L — ABNORMAL LOW (ref 22–32)
Calcium: 7.1 mg/dL — ABNORMAL LOW (ref 8.9–10.3)
Chloride: 108 mmol/L (ref 101–111)
Creatinine, Ser: 1.04 mg/dL — ABNORMAL HIGH (ref 0.44–1.00)
GFR calc Af Amer: >60 mL/min (ref >60)
GFR calc non Af Amer: 60 mL/min — ABNORMAL LOW (ref >60)
Glucose, Bld: 115 mg/dL — ABNORMAL HIGH (ref 65–99)
POTASSIUM: 5.1 mmol/L (ref 3.5–5.1)
Sodium: 134 mmol/L — ABNORMAL LOW (ref 135–145)

## 2017-06-02 MED ORDER — DEXTROSE-NACL 5-0.45 % IV SOLN
INTRAVENOUS | Status: DC
  Administered 2017-06-02 – 2017-06-03 (×4): via INTRAVENOUS

## 2017-06-02 MED ORDER — SODIUM CHLORIDE 0.9 % IV BOLUS (SEPSIS)
500.0000 mL | Freq: Once | INTRAVENOUS | Status: AC
  Administered 2017-06-02: 500 mL via INTRAVENOUS

## 2017-06-02 NOTE — Progress Notes (Signed)
Physical Therapy Treatment Patient Details Name: Alison Lynch MRN: [id number] DOB: [birth year] Today's Date: 06/02/2017    History of Present Illness  56 year old female has a history of bilateral common iliac artery stenting that are now both occluded. She also has a left above-the-knee amputation has rest pain in the right lower extremity. She is now indicated for aortobifemoral bypass grafting. PMH:  depression, Migraines, HTN, PTSD, Bil iliac stents.      PT Comments    Patient limited by lethargy and falling in and out of sleep throughout session however was eager to mobilize. Pt required mod A for all mobility. PT will continues to follow acutely for mobility progression and d/c planning. Suspect pt will need less assistance when more alert.    Follow Up Recommendations  Supervision/Assistance - 24 hour;Home health PT     Equipment Recommendations  Other (comment) (TBA)    Recommendations for Other Services       Precautions / Restrictions Precautions Precautions: Fall Restrictions Weight Bearing Restrictions: No    Mobility  Bed Mobility Overal bed mobility: Needs Assistance Bed Mobility: Rolling;Sidelying to Sit Rolling: Min assist Sidelying to sit: Mod assist       General bed mobility comments: assist at bilat LE and trunk; cues for sequnecing and use of bed rail  Transfers Overall transfer level: Needs assistance Equipment used: Rolling walker (2 wheeled) Transfers: Sit to/from [employer] Sit to Stand: Mod assist   Squat pivot transfers: Mod assist     General transfer comment: assist to power up into standing then for balance and management of RW during pivot transfer; cues for safe hand placement/use of RW and sequencing; pt unable to achieve full upright posture and required guidance of hips to surface  Ambulation/Gait             General Gait Details: unable at this time   Stairs            Wheelchair Mobility     Modified Rankin (Stroke Patients Only)       Balance Overall balance assessment: Needs assistance;History of Falls Sitting-balance support: Feet supported;Bilateral upper extremity supported Sitting balance-Leahy Scale: Fair     Standing balance support: Bilateral upper extremity supported;During functional activity Standing balance-Leahy Scale: Poor Standing balance comment: relies on Bil UE support and could not stand fully upright.                            Cognition Arousal/Alertness: Lethargic;Suspect due to medications Behavior During Therapy: Flat affect Overall Cognitive Status: Difficult to assess                                 General Comments: pt eager to get OOB and in and out of falling asleep during session      Exercises      General Comments        Pertinent Vitals/Pain Pain Assessment: Faces Faces Pain Scale: Hurts little more Pain Location: chest Pain Descriptors / Indicators: Grimacing;Guarding;Sore Pain Intervention(s): Limited activity within patient's tolerance;Premedicated before session;Monitored during session;Repositioned    Home Living                      Prior Function            PT Goals (current goals can now be found in the care plan section) Acute Rehab PT Goals  Patient Stated Goal: to get better PT Goal Formulation: With patient Time For Goal Achievement: 06/14/17 Potential to Achieve Goals: Good Progress towards PT goals: Progressing toward goals    Frequency    Min 3X/week      PT Plan Current plan remains appropriate    Co-evaluation              AM-PAC PT "6 Clicks" Daily Activity  Outcome Measure  Difficulty turning over in bed (including adjusting bedclothes, sheets and blankets)?: Total Difficulty moving from lying on back to sitting on the side of the bed? : Total Difficulty sitting down on and standing up from a chair with arms (e.g., wheelchair, bedside  commode, etc,.)?: Total Help needed moving to and from a bed to chair (including a wheelchair)?: A Lot Help needed walking in hospital room?: Total Help needed climbing 3-5 steps with a railing? : Total 6 Click Score: 7    End of Session Equipment Utilized During Treatment: Gait belt Activity Tolerance: Patient limited by lethargy Patient left: in chair;with call bell/phone within reach;with chair alarm set Nurse Communication: Mobility status PT Visit Diagnosis: Unsteadiness on feet (R26.81);Muscle weakness (generalized) (M62.81);History of falling (Z91.81);Pain Pain - Right/Left: Right Pain - part of body: Hip (groin, abdomen)     Time: [id number] PT Time Calculation (min) (ACUTE ONLY): 36 min  Charges:  $Therapeutic Activity: 23-37 mins                    G Codes:       Alison Lynch, PTA Pager: [phone]     Alison Lynch 06/02/2017, 4:28 PM

## 2017-06-02 NOTE — Progress Notes (Signed)
PT Cancellation Note  Patient Details Name: Alison Lynch MRN: [id number] DOB: 07/08/1961   Cancelled Treatment:    Reason Eval/Treat Not Completed: Fatigue/lethargy limiting ability to participate Pt too lethargic to participate at this time-suspect due to medication. PT will check on pt later as time allows.    Derek MoundKellyn R Cherlyn Syring Felissa Blouch, PTA Pager: [phone]   06/02/2017, 10:08 AM

## 2017-06-02 NOTE — Progress Notes (Signed)
Pt 's line was leaking when flushed. IV team notified, When assesced, no blood return noted. IV team changed the dressing and clamp the line. New peripheral line started

## 2017-06-02 NOTE — Progress Notes (Addendum)
  AAA Progress Note    06/02/2017 7:36 AM 3 Days Post-Op  Subjective:  Says she is still having pain.  No more nausea  afebrile Systolic BP this am in the 70's HR 110's ST 100% 2.5LO2NC  Vitals:   06/02/17 0400 06/02/17 0435  BP:  (!) 78/56  Pulse:  (!) 114  Resp: 18 20  Temp:  98.4 F (36.9 C)    Physical Exam: Cardiac:  tachy Lungs:  Non labored Abdomen:  Soft; NT; +flatus; +BM Incisions:  All are healing nicely Extremities:  Brisk DP/PT doppler signals  CBC    Component Value Date/Time   WBC 12.9 (H) 05/31/2017 1602   RBC 4.71 05/31/2017 1602   HGB 13.7 05/31/2017 1602   HCT 41.7 05/31/2017 1602   PLT 167 05/31/2017 1602   MCV 88.5 05/31/2017 1602   MCH 29.1 05/31/2017 1602   MCHC 32.9 05/31/2017 1602   RDW 17.6 (H) 05/31/2017 1602   LYMPHSABS 1.6 01/14/2017 0514   MONOABS 0.9 01/14/2017 0514   EOSABS 0.5 01/14/2017 0514   BASOSABS 0.0 01/14/2017 0514    BMET    Component Value Date/Time   NA 134 (L) 06/02/2017 0333   K 5.1 06/02/2017 0333   CL 108 06/02/2017 0333   CO2 20 (L) 06/02/2017 0333   GLUCOSE 115 (H) 06/02/2017 0333   BUN 15 06/02/2017 0333   CREATININE 1.04 (H) 06/02/2017 0333   CALCIUM 7.1 (L) 06/02/2017 0333   GFRNONAA 60 (L) 06/02/2017 0333   GFRAA >60 06/02/2017 0333    INR    Component Value Date/Time   INR 1.17 05/30/2017 1225     Intake/Output Summary (Last 24 hours) at 06/02/17 0736 Last data filed at 06/02/17 0407  Gross per 24 hour  Intake             1711 ml  Output             1052 ml  Net              659 ml     Assessment/Plan:  56 y.o. female is s/p  Aortobifemoral bypass grafting 3 Days Post-Op  -pt hypotensive this am-will give 500L fluid bolus -dc central line after fluid bolus -no further nausea-will adv diet to clear liquids -continue to mobilize.  PT recommending 24 hr supervision/assistance with HHPT.  Will order CM consult to arrange    Doreatha MassedSamantha Rhyne, PA-C [facility] [phone] 06/02/2017 7:36 AM   I have independently interviewed patient and agree with PA assessment and plan above. Continues to progress with asymptomatic hypotension this a.m.Marland Kitchen. Renal function at baseline and ok for foley to be removed. Continue to mobilize and advance diet as tolerates.   Eilish Mcdaniel C. Randie Heinzain, MD [facility] [place] Office: [phone] Pager: [phone]

## 2017-06-02 NOTE — Progress Notes (Signed)
Paged Rhyne, PA to find out indication for urinary catheter, if not needed will ask if we can discontinue it. Awaiting call back.

## 2017-06-03 LAB — CBC WITH DIFFERENTIAL/PLATELET
Basophils Absolute: 0 10*3/uL (ref 0.0–0.1)
Basophils Relative: 0 %
Eosinophils Absolute: 0 10*3/uL (ref 0.0–0.7)
Eosinophils Relative: 0 %
HEMATOCRIT: 35.5 % — AB (ref 36.0–46.0)
Hemoglobin: 11.5 g/dL — ABNORMAL LOW (ref 12.0–15.0)
Lymphocytes Relative: 7 %
Lymphs Abs: 0.8 10*3/uL (ref 0.7–4.0)
MCH: 28.8 pg (ref 26.0–34.0)
MCHC: 32.4 g/dL (ref 30.0–36.0)
MCV: 88.8 fL (ref 78.0–100.0)
MONO ABS: 0.9 10*3/uL (ref 0.1–1.0)
Monocytes Relative: 8 %
NEUTROS PCT: 85 %
Neutro Abs: 10.1 10*3/uL — ABNORMAL HIGH (ref 1.7–7.7)
PLATELETS: 176 10*3/uL (ref 150–400)
RBC: 4 MIL/uL (ref 3.87–5.11)
RDW: 15.8 % — ABNORMAL HIGH (ref 11.5–15.5)
WBC: 11.8 10*3/uL — ABNORMAL HIGH (ref 4.0–10.5)

## 2017-06-03 LAB — GLUCOSE, CAPILLARY
GLUCOSE-CAPILLARY: 111 mg/dL — AB (ref 65–99)
Glucose-Capillary: 145 mg/dL — ABNORMAL HIGH (ref 65–99)
Glucose-Capillary: 124 mg/dL — ABNORMAL HIGH (ref 65–99)

## 2017-06-03 LAB — BASIC METABOLIC PANEL
ANION GAP: 8 (ref 5–15)
BUN: 8 mg/dL (ref 6–20)
CO2: 22 mmol/L (ref 22–32)
Calcium: 7.7 mg/dL — ABNORMAL LOW (ref 8.9–10.3)
Chloride: 104 mmol/L (ref 101–111)
Creatinine, Ser: 0.85 mg/dL (ref 0.44–1.00)
GFR calc Af Amer: >60 mL/min (ref >60)
GFR calc non Af Amer: >60 mL/min (ref >60)
Glucose, Bld: 122 mg/dL — ABNORMAL HIGH (ref 65–99)
POTASSIUM: 3.9 mmol/L (ref 3.5–5.1)
Sodium: 134 mmol/L — ABNORMAL LOW (ref 135–145)

## 2017-06-03 MED ORDER — METHOCARBAMOL 1000 MG/10ML IJ SOLN
500.0000 mg | Freq: Three times a day (TID) | INTRAVENOUS | Status: DC
  Administered 2017-06-03 – 2017-06-04 (×4): 500 mg via INTRAVENOUS
  Filled 2017-06-03 (×8): qty 5

## 2017-06-03 MED ORDER — OXYCODONE-ACETAMINOPHEN 5-325 MG PO TABS
1.0000 | ORAL_TABLET | ORAL | Status: DC | PRN
  Administered 2017-06-03: 1 via ORAL
  Administered 2017-06-04: 2 via ORAL
  Administered 2017-06-04 – 2017-06-05 (×2): 1 via ORAL
  Administered 2017-06-05 (×2): 2 via ORAL
  Filled 2017-06-03 (×2): qty 1
  Filled 2017-06-03 (×4): qty 2

## 2017-06-03 MED ORDER — GABAPENTIN 400 MG PO CAPS
400.0000 mg | ORAL_CAPSULE | Freq: Three times a day (TID) | ORAL | Status: DC
  Administered 2017-06-03 – 2017-06-06 (×10): 400 mg via ORAL
  Filled 2017-06-03 (×10): qty 1

## 2017-06-03 MED ORDER — SERTRALINE HCL 50 MG PO TABS
50.0000 mg | ORAL_TABLET | Freq: Every day | ORAL | Status: DC
  Administered 2017-06-03 – 2017-06-06 (×4): 50 mg via ORAL
  Filled 2017-06-03 (×5): qty 1

## 2017-06-03 NOTE — Progress Notes (Signed)
OT Cancellation Note  Patient Details Name: Alison Lynch MRN: [id number] DOB: 08/31/1961   Cancelled Treatment:    Reason Eval/Treat Not Completed: Pain limiting ability to participate.  Pt up in chair by nursing and having back spasms.  ADL already completed this am. Will check back later as schedule permits.  [name] 06/03/2017, 7:24 AM  [name], OTR/L [phone] 06/03/2017

## 2017-06-03 NOTE — Progress Notes (Signed)
Dr. Randie Heinzain aware of muscle spasms and although not trying to get OOB or be impulsive, has been saying some things that don't make sense in conversation. Will be in soon and will assess her then. Sitting in chair with chair alarm on.

## 2017-06-03 NOTE — Progress Notes (Signed)
  Progress Note    06/03/2017 8:16 AM 4 Days Post-Op  Subjective:  Complains of back spasms  Vitals:   06/03/17 0445 06/03/17 0530  BP: (!) 112/53   Pulse: (!) 113   Resp: 19 20  Temp: 98.5 F (36.9 C)     Physical Exam: Oriented x 3 Abdomen is soft Incisions cdi Right foot is warm  CBC    Component Value Date/Time   WBC 11.8 (H) 06/03/2017 0244   RBC 4.00 06/03/2017 0244   HGB 11.5 (L) 06/03/2017 0244   HCT 35.5 (L) 06/03/2017 0244   PLT 176 06/03/2017 0244   MCV 88.8 06/03/2017 0244   MCH 28.8 06/03/2017 0244   MCHC 32.4 06/03/2017 0244   RDW 15.8 (H) 06/03/2017 0244   LYMPHSABS 0.8 06/03/2017 0244   MONOABS 0.9 06/03/2017 0244   EOSABS 0.0 06/03/2017 0244   BASOSABS 0.0 06/03/2017 0244    BMET    Component Value Date/Time   NA 134 (L) 06/03/2017 0244   K 3.9 06/03/2017 0244   CL 104 06/03/2017 0244   CO2 22 06/03/2017 0244   GLUCOSE 122 (H) 06/03/2017 0244   BUN 8 06/03/2017 0244   CREATININE 0.85 06/03/2017 0244   CALCIUM 7.7 (L) 06/03/2017 0244   GFRNONAA >60 06/03/2017 0244   GFRAA >60 06/03/2017 0244    INR    Component Value Date/Time   INR 1.17 05/30/2017 1225     Intake/Output Summary (Last 24 hours) at 06/03/17 0816 Last data filed at 06/03/17 0527  Gross per 24 hour  Intake             1613 ml  Output             1000 ml  Net              613 ml     Assessment:  56 y.o. female is pod#4 Aortobifemoral bypass. Progressing as expected.   Plan: Po pain meds with attempt to decrease pca use Robaxin for muscle spasms Minimize ivf Continue clear liquids Restart home meds Pt/ot lovenox for dvt ppx   Diane Mochizuki C. Randie Heinzain, MD [facility] [place] Office: [phone] Pager: [phone]  06/03/2017 8:16 AM

## 2017-06-03 NOTE — Care Management Important Message (Signed)
Important Message  Patient Details  Name: Alison Lynch MRN: [id number] Date of Birth: 07/26/1961   Medicare Important Message Given:  Yes    Lenward Able 06/03/2017, 12:01 PM

## 2017-06-03 NOTE — Progress Notes (Signed)
Physical Therapy Treatment Patient Details Name: Alison Lynch MRN: [id number] DOB: Jan 20, 1961 Today's Date: 06/03/2017    History of Present Illness  56 year old female has a history of bilateral common iliac artery stenting that are now both occluded. She also has a left above-the-knee amputation has rest pain in the right lower extremity. She is now indicated for aortobifemoral bypass grafting. PMH:  depression, Migraines, HTN, PTSD, Bil iliac stents.      PT Comments    Pt tolerance limited this session secondary to back spasms and lethargy. Pt requiring mod A +2 for safety during transfer this session. Multiple safety cues required for use of RW. Pt falling asleep in and out throughout mobility. Will continue to follow acutely to ensure safety with mobility prior to return home. Will need to ensure husband can provide assist she needs. Anticipate pt will progress well as lethargy improves.   Follow Up Recommendations  Supervision/Assistance - 24 hour;Home health PT     Equipment Recommendations  Other (comment) (TBA)    Recommendations for Other Services       Precautions / Restrictions Precautions Precautions: Fall Precaution Comments: sitter present in room; L AKA  Restrictions Weight Bearing Restrictions: No LLE Weight Bearing: Non weight bearing Other Position/Activity Restrictions: L AKA     Mobility  Bed Mobility Overal bed mobility: Needs Assistance Bed Mobility: Sit to Supine       Sit to supine: Mod assist   General bed mobility comments: Mod A for BLE lift assist. Pt falling asleep immediately upon return to bed. Easily arousable, but in and out of sleep throughout remainder of session.   Transfers Overall transfer level: Needs assistance Equipment used: Rolling walker (2 wheeled) Transfers: Sit to/from [employer] Sit to Stand: Mod assist Stand pivot transfers: Mod assist;+2 safety/equipment       General transfer comment: Mod A for lift  assist to standing. Verbal cues for upright posture and for safe use of RW. Pt grasping onto PT arm with one hand and would not move it down to walker. Assist needed for RW management. Required guidance of hips to surface.   Ambulation/Gait             General Gait Details: NT   Stairs            Wheelchair Mobility    Modified Rankin (Stroke Patients Only)       Balance Overall balance assessment: Needs assistance;History of Falls Sitting-balance support: Feet supported;Bilateral upper extremity supported Sitting balance-Leahy Scale: Fair     Standing balance support: Bilateral upper extremity supported;During functional activity Standing balance-Leahy Scale: Poor Standing balance comment: Reliant on BUE for balance                             Cognition Arousal/Alertness: Lethargic;Suspect due to medications Behavior During Therapy: Flat affect Overall Cognitive Status: Difficult to assess                                 General Comments: Pt ready to get back in bed secondary to pain; falling asleep during session and requiring multiple safety cues.       Exercises      General Comments General comments (skin integrity, edema, etc.): Pt very lethargic throughout. Tolerance limited secondary to lethargy and back pain. Educated about RLE exercise program.       Pertinent Vitals/Pain  Pain Assessment: Faces Faces Pain Scale: Hurts even more Pain Location: back  Pain Descriptors / Indicators: Grimacing;Guarding;Sore Pain Intervention(s): Limited activity within patient's tolerance;Monitored during session;Repositioned    Home Living                      Prior Function            PT Goals (current goals can now be found in the care plan section) Acute Rehab PT Goals Patient Stated Goal: to get better PT Goal Formulation: With patient Time For Goal Achievement: 06/14/17 Potential to Achieve Goals: Fair Progress  towards PT goals: Progressing toward goals    Frequency    Min 3X/week      PT Plan Current plan remains appropriate    Co-evaluation              AM-PAC PT "6 Clicks" Daily Activity  Outcome Measure  Difficulty turning over in bed (including adjusting bedclothes, sheets and blankets)?: Total Difficulty moving from lying on back to sitting on the side of the bed? : Total Difficulty sitting down on and standing up from a chair with arms (e.g., wheelchair, bedside commode, etc,.)?: Total Help needed moving to and from a bed to chair (including a wheelchair)?: A Lot Help needed walking in hospital room?: Total Help needed climbing 3-5 steps with a railing? : Total 6 Click Score: 7    End of Session Equipment Utilized During Treatment: Gait belt;Oxygen Activity Tolerance: Patient limited by lethargy Patient left: in bed;with call bell/phone within reach;with nursing/sitter in room Nurse Communication: Mobility status PT Visit Diagnosis: Unsteadiness on feet (R26.81);Muscle weakness (generalized) (M62.81);History of falling (Z91.81);Pain Pain - part of body:  (back )     Time: [id number] PT Time Calculation (min) (ACUTE ONLY): 12 min  Charges:  $Therapeutic Activity: 8-22 mins                    G Codes:       Gladys DammeBrittany Mallary Kreger, PT, DPT  Acute Rehabilitation Services  Pager: [phone]    Lehman PromBrittany S Isabela Nardelli 06/03/2017, 12:39 PM

## 2017-06-03 NOTE — Progress Notes (Signed)
Handoff report given to Charge Nurse, Sabino SnipesKaitlen. Patient is currently resting in bed, no s/sx of distress. Sitter at bedside.

## 2017-06-03 NOTE — Progress Notes (Signed)
Foley removed without difficulty. Leaking noted prior to removing. External catheter applied due to high probability pt will be incontinent. Barrier cream applied to buttock for redness between cheeks.

## 2017-06-04 LAB — GLUCOSE, CAPILLARY
GLUCOSE-CAPILLARY: 110 mg/dL — AB (ref 65–99)
Glucose-Capillary: 110 mg/dL — ABNORMAL HIGH (ref 65–99)
Glucose-Capillary: 149 mg/dL — ABNORMAL HIGH (ref 65–99)
Glucose-Capillary: 134 mg/dL — ABNORMAL HIGH (ref 65–99)

## 2017-06-04 MED ORDER — PANTOPRAZOLE SODIUM 40 MG PO TBEC
40.0000 mg | DELAYED_RELEASE_TABLET | Freq: Every day | ORAL | Status: DC
  Administered 2017-06-04 – 2017-06-05 (×2): 40 mg via ORAL
  Filled 2017-06-04 (×2): qty 1

## 2017-06-04 NOTE — Progress Notes (Addendum)
[facility]  Subjective  - Tolerating liquids no N/V.   Objective (!) 125/37 (!) 108 98 F (36.7 C) (Oral) 20 100%  Intake/Output Summary (Last 24 hours) at 06/04/17 0811 Last data filed at 06/04/17 0653  Gross per 24 hour  Intake             1370 ml  Output                0 ml  Net             1370 ml    Right LE warm with sensation and active range of motion intact Abdomin soft with positive BS, incision healing well Groin incisions soft without hematoma   Assessment/Planning: POD # 5 Aortobifemoral bypass.  Tolerated liquids with BS, will advance diet as tolerates D/C PCA now on PO pain medication Wean off O2 Lovenox foe DVT PPX Saline lock IV  COLLINS, Alison Lynch 06/04/2017 8:11 AM -- Advance diet D/c pca Saline lock Right foot warm Incisions clean  D/c home tomorrow  Fabienne Brunsharles Fields, MD [facility] [place] Office: [phone] Pager: [phone]  Laboratory Lab Results:  Recent Labs  06/03/17 0244  WBC 11.8*  HGB 11.5*  HCT 35.5*  PLT 176   BMET  Recent Labs  06/02/17 0333 06/03/17 0244  NA 134* 134*  K 5.1 3.9  CL 108 104  CO2 20* 22  GLUCOSE 115* 122*  BUN 15 8  CREATININE 1.04* 0.85  CALCIUM 7.1* 7.7*    COAG Lab Results  Component Value Date   INR 1.17 05/30/2017   INR 0.93 05/24/2017   INR 1.14 01/08/2017   No results found for: PTT

## 2017-06-05 LAB — GLUCOSE, CAPILLARY
GLUCOSE-CAPILLARY: 112 mg/dL — AB (ref 65–99)
Glucose-Capillary: 99 mg/dL (ref 65–99)
Glucose-Capillary: 87 mg/dL (ref 65–99)
Glucose-Capillary: 87 mg/dL (ref 65–99)

## 2017-06-05 MED ORDER — LORAZEPAM 1 MG PO TABS
1.0000 mg | ORAL_TABLET | Freq: Four times a day (QID) | ORAL | Status: DC | PRN
  Administered 2017-06-05: 1 mg via ORAL
  Filled 2017-06-05: qty 1

## 2017-06-05 MED ORDER — METHOCARBAMOL 500 MG PO TABS: 500.0000 mg | ORAL_TABLET | Freq: Four times a day (QID) | ORAL | Status: DC | PRN

## 2017-06-05 NOTE — Progress Notes (Addendum)
[facility]  Subjective  - Confused still.  Had BM. Tolerated PO regular diet.   Objective (!) 117/56 (!) 102 (!) 97.4 F (36.3 C) (Axillary) 17 95%  Intake/Output Summary (Last 24 hours) at 06/05/17 0842 Last data filed at 06/04/17 1950  Gross per 24 hour  Intake                0 ml  Output              550 ml  Net             -550 ml   Right LE warm with sensation and active range of motion intact Abdomin soft with positive BS, incision healing well Groin incisions soft without hematoma Lungs non labored breathing   Assessment/Planning: POD # 6 Aortobifemoral bypass.  Patient will go home tomorrow with her mother as her care giver.   Lovenox foe DVT PPX Ambulate Tolerating PO's, had BM.   Clinton GallantCOLLINS, Alison [facility] 06/05/2017 8:42 AM -- Agree with above.  Incisions healing. Dispo plan evolving. Tolerating diet. PO pain meds  Alison Brunsharles Staton [name], MD [facility] [place] Office: [phone] Pager: [phone]   Laboratory Lab Results:  Recent Labs  06/03/17 0244  WBC 11.8*  HGB 11.5*  HCT 35.5*  PLT 176   BMET  Recent Labs  06/03/17 0244  NA 134*  K 3.9  CL 104  CO2 22  GLUCOSE 122*  BUN 8  CREATININE 0.85  CALCIUM 7.7*    COAG Lab Results  Component Value Date   INR 1.17 05/30/2017   INR 0.93 05/24/2017   INR 1.14 01/08/2017   No results found for: PTT

## 2017-06-06 ENCOUNTER — Telehealth: Payer: Self-pay | Admitting: Vascular Surgery

## 2017-06-06 LAB — CREATININE, SERUM
Creatinine, Ser: 0.9 mg/dL (ref 0.44–1.00)
GFR calc Af Amer: >60 mL/min (ref >60)
GFR calc non Af Amer: >60 mL/min (ref >60)

## 2017-06-06 LAB — GLUCOSE, CAPILLARY: GLUCOSE-CAPILLARY: 102 mg/dL — AB (ref 65–99)

## 2017-06-06 MED ORDER — OXYCODONE-ACETAMINOPHEN 5-325 MG PO TABS: 1.0000 | ORAL_TABLET | ORAL | 0 refills | Status: DC | PRN

## 2017-06-06 NOTE — Care Management Note (Addendum)
Case Management Note Alison PieriniKristi Jadi Deyarmin RN, BSN Unit 2W-Case Manager-- 2H coverage [phone]  Patient Details  Name: Alison Lynch MRN: [id number] Date of Birth: 06/17/1961  Subjective/Objective:  Pt admitted s/p  Aortobifemoral bypass grafting on 05/30/17                  Action/Plan: PTA pt lived at home with spouse- anticipate return home- CM to follow post op progress for d/c needs-- PT/OT evals pending  Expected Discharge Date:  06/06/17               Expected Discharge Plan:  Home w Home Health Services  In-House Referral:     Discharge planning Services  CM Consult  Post Acute Care Choice:  Home Health Choice offered to:  Patient  DME Arranged:  N/A DME Agency:  NA  HH Arranged:  PT HH Agency:  [facility]  Status of Service:  Completed, signed off  If discussed at Long Length of Stay Meetings, dates discussed:    Discharge Disposition: home/home health   Additional Comments:  06/06/17- 0945- Alison PieriniKristi Dalia Jollie RN, CM- pt for d/c home today- order placed for HHPT- spoke with pt at bedside- choice offered for HHPT services per pt [facility] and would [facility] them [facility]- for HHPT- [facility]- per pt she has needed DME that includes RW, w/c, and shower chair-  1024- update-received return call [facility] [place] [facility]- referral confirmed- accepted-they will call pt tomorrow to set up home visits.   Alison Lynch, Alison Sorenson Hall, RN 06/06/2017, 10:01 AM

## 2017-06-06 NOTE — Telephone Encounter (Signed)
-----   Message from Marilyn K McChesney, RN sent at 06/06/2017  8:47 AM EDT ----- °Regarding: 2 weeks  ° ° °----- Message ----- °From: Trinh, Kimberly A, PA-C °Sent: 06/06/2017   7:31 AM °To: Vvs Charge Pool ° °S/p aortobifemoral bypass 05/30/17 ° °F/u with Dr. Cain 2 weeks from now.  ° °Thanks °Kim °

## 2017-06-06 NOTE — Discharge Summary (Signed)
[facility] Discharge Summary  Alison Lynch 04/13/1961 56 y.o. female  [id number]  Admission Date: 05/30/2017  Discharge Date: 06/06/2017  Physician: Lemar LivingsBrandon Cain, MD  Admission Diagnosis: Aortoiliac occlusive disease (HCC) [I74.09]  HPI:   This is a 56 y.o. female who presented for follow-up of her previous bilateral iliac artery stenting as well as left above-knee amputation for acute left lower extremity ischemia. She is progressing well and is now healed her bony of dictation the left. She does complain of coolness to her right foot. She has not had any wounds or rest pain to the right foot. She is hopeful to get a prosthesis soon. She does continue to smoke unfortunately does not take any antiplatelet or anticoagulants at this time. She is on statin drug.   Hospital Course:  The patient was admitted to the hospital and taken to the operating room on 05/30/2017 and underwent: aortobifemoral bypass  The patient tolerated the procedure well and was transported to the PACU in stable condition.   She was extubated in the OR. She was started on a morphine PCA for pain control.   She had strong right PT and DP doppler signals on post-op. She was transferred out of the ICU to stepdown on POD 1. Her incisions were clean, dry and intact. Her creatinine was 1.4 on POD 1 with marginal urinal output. She was gently hydrated. Her NG tube was removed on POD 2. She was transferred to the floor on POD 2. Her renal function was back at baseline on POD 3. She had asymptomatic hypotension on POD 3 and was given a fluid bolus. Her diet was advanced on POD 3.   The remainder of her hospitalization consisted of advancing diet, increasing mobilization and weaning off of IV pain meds. By POD 7, she was tolerating a regular diet and had bowel movements. Her pain was well controlled and the patient felt she was able to go home safely. She was discharged home on POD 7 in good condition.   CBC   Component Value Date/Time   WBC 11.8 (H) 06/03/2017 0244   RBC 4.00 06/03/2017 0244   HGB 11.5 (L) 06/03/2017 0244   HCT 35.5 (L) 06/03/2017 0244   PLT 176 06/03/2017 0244   MCV 88.8 06/03/2017 0244   MCH 28.8 06/03/2017 0244   MCHC 32.4 06/03/2017 0244   RDW 15.8 (H) 06/03/2017 0244   LYMPHSABS 0.8 06/03/2017 0244   MONOABS 0.9 06/03/2017 0244   EOSABS 0.0 06/03/2017 0244   BASOSABS 0.0 06/03/2017 0244    BMET    Component Value Date/Time   NA 134 (L) 06/03/2017 0244   K 3.9 06/03/2017 0244   CL 104 06/03/2017 0244   CO2 22 06/03/2017 0244   GLUCOSE 122 (H) 06/03/2017 0244   BUN 8 06/03/2017 0244   CREATININE 0.90 06/06/2017 0434   CALCIUM 7.7 (L) 06/03/2017 0244   GFRNONAA >60 06/06/2017 0434   GFRAA >60 06/06/2017 0434     Discharge Instructions:   The patient is discharged to home with extensive instructions on wound care and progressive ambulation. They are instructed not to drive or perform any heavy lifting until returning to see the physician in his office.  Discharge Instructions    Call MD for:  redness, tenderness, or signs of infection (pain, swelling, bleeding, redness, odor or green/yellow discharge around incision site)    Complete by:  As directed    Call MD for:  severe or increased pain, loss or decreased  feeling  in affected limb(s)    Complete by:  As directed    Call MD for:  temperature >100.5    Complete by:  As directed    Discharge wound care:    Complete by:  As directed    Wash the groin wounds with soap and water daily and pat dry. (No tub bath-only shower)  Then put a dry gauze or washcloth there to keep this area dry daily and as needed.  Do not use Vaseline or neosporin on your incisions.  Only use soap and water on your incisions and then protect and keep dry.  Wash abdominal wound daily with soap and water and pat dry.   Driving Restrictions    Complete by:  As directed    No driving for 4 weeks   Increase activity slowly     Complete by:  As directed    Walk with assistance use walker or cane as needed   Lifting restrictions    Complete by:  As directed    No lifting for 2 weeks   Resume previous diet    Complete by:  As directed       Discharge Diagnosis:  Aortoiliac occlusive disease (HCC) [I74.09]  Secondary Diagnosis: Patient Active Problem List   Diagnosis Date Noted  . Aortoiliac occlusive disease (HCC) 05/30/2017  . Peripheral artery disease (HCC) 05/12/2017  . Pre-operative cardiovascular examination 05/12/2017  . Hyperlipidemia LDL goal <70 05/12/2017  . Above knee amputation of left lower extremity (HCC) 01/13/2017  . Benign essential HTN   . PTSD (post-traumatic stress disorder)   . AKI (acute kidney injury) (HCC)   . Stage 3 chronic kidney disease   . Phantom limb syndrome with pain (HCC)   . Cervical myelopathy (HCC)   . Chronic pain syndrome   . Leukocytosis   . Thrombocytopenia (HCC)   . Ischemia of lower extremity 01/08/2017  . Ischemia of left lower extremity 01/08/2017   Past Medical History:  Diagnosis Date  . Cervical myelopathy (HCC)   . 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 Depression   . Dysphagia   . Hx of migraine headaches   . Hyperlipidemia   . Hypothyroid   . Leriche syndrome (HCC) 12/2016   Chronically occluded aorta  . Osteoporosis   . PAD (peripheral artery disease) (HCC)    Status post left AKA for extensive thromboembolism, right leg has in-line flow through popliteal artery only.  Bilateral iliac stents occluded  . Panic attacks   . PTSD (post-traumatic stress disorder)      Allergies as of 06/06/2017      Reactions   Clindamycin/lincomycin Rash      Medication List    TAKE these medications   aspirin EC 81 MG tablet Take 81 mg by mouth daily.   atorvastatin 80 MG tablet Commonly known as:  LIPITOR Take 1 tablet (80 mg  total) by mouth daily.   busPIRone 10 MG tablet Commonly known as:  BUSPAR Take 10-15 mg by mouth See admin instructions. Take 15 mg every morning and take 10 mg at bedtime   gabapentin 400 MG capsule Commonly known as:  NEURONTIN Take 1 capsule (400 mg total) by mouth 3 (three) times daily.   mirtazapine 15 MG tablet Commonly known as:  REMERON Take 15 mg by mouth at bedtime.   nicotine 7 mg/24hr patch Commonly known  as:  NICODERM CQ - dosed in mg/24 hr Place 1 patch (7 mg total) onto the skin daily.   oxyCODONE-acetaminophen 5-325 MG tablet Commonly known as:  PERCOCET/ROXICET Take 1-2 tablets by mouth every 4 (four) hours as needed for moderate pain.   sertraline 50 MG tablet Commonly known as:  ZOLOFT Take 1 tablet (50 mg total) by mouth daily.   traMADol 50 MG tablet Commonly known as:  ULTRAM Take 1 tablet (50 mg total) by mouth daily.       Percocet #30 No Refill  Disposition: Home  Patient's condition: is Good  Follow up: 1. Dr. Randie Heinz in 2 weeks   Maris Berger, PA-C [facility] [phone] 06/06/2017  1:49 PM   - For VQI Registry use ---   Post-op:  Time to Extubation: [ x] In OR, [ ]  < 12 hrs, [ ]  12-24 hrs, [ ]  >=24 hrs Vasopressors Req. Post-op: No ICU Stay: 1 days Transfusion: No   MI: No, [ ]  Troponin only, [ ]  EKG or Clinical New Arrhythmia: No  Complications: CHF: No Resp failure: No, [ ]  Pneumonia, [ ]  Ventilator Chg in renal function: Yes, [ x] Inc. Cr > 0.5, [ ]  Temp. Dialysis, [ ]  Permanent dialysis Leg ischemia: No, no Surgery needed, [ ]  Yes, Surgery needed, [ ]  Amputation Bowel ischemia: No, [ ]  Medical Rx, [ ]  Surgical Rx Wound complication: No, [ ]  Superficial separation/infection, [ ]  Return to OR Return to OR: No  Return to OR for bleeding: No Stroke: No, [ ]  Minor, [ ]  Major  Discharge medications: Statin use:  Yes If No: [ ]  For Medical reasons, [ ]  Non-compliant ASA use:  Yes  If No: [ ]  For  Medical reasons, [ ]  Non-compliant Plavix use:  No If No: [ ]  For Medical reasons, [ ]  Non-compliant,  [x]  not indicated  Beta blocker use:  No If No: [ ]  For Medical reasons, [ ]  Non-compliant,  [x]  not indicated

## 2017-06-06 NOTE — Telephone Encounter (Signed)
Sched appt 06/29/17 at 9:30. Ph# not accepting calls, mailed appt letter.

## 2017-06-06 NOTE — Progress Notes (Addendum)
  Progress Note  SUBJECTIVE:    POD #7  Had some nausea yesterday. Abdomen hurts some. Had BM yesterday.   OBJECTIVE:   Vitals:   06/05/17 2027 06/06/17 0622  BP: 133/68 122/60  Pulse: 92 91  Resp: 18 18  Temp: 98.6 F (37 C) 98.4 F (36.9 C)    Intake/Output Summary (Last 24 hours) at 06/06/17 0727 Last data filed at 06/05/17 1641  Gross per 24 hour  Intake              478 ml  Output                0 ml  Net              478 ml   Abdomen soft and non-tender. Incisions clean, dry and intact.  Monophasic right DP and PT signals.  ASSESSMENT/PLAN:   56 y.o. female is s/p: aortobifemoral bypass 7 Days Post-Op   Has been having abdominal pain but is tolerating diet. Having BMs.  Wants to go home. Does not feel that she needs rehab.  D/c home today.   Raymond GurneyKimberly A Trinh 06/06/2017 7:27 AM -- LABS:   CBC    Component Value Date/Time   WBC 11.8 (H) 06/03/2017 0244   HGB 11.5 (L) 06/03/2017 0244   HCT 35.5 (L) 06/03/2017 0244   PLT 176 06/03/2017 0244    BMET    Component Value Date/Time   NA 134 (L) 06/03/2017 0244   K 3.9 06/03/2017 0244   CL 104 06/03/2017 0244   CO2 22 06/03/2017 0244   GLUCOSE 122 (H) 06/03/2017 0244   BUN 8 06/03/2017 0244   CREATININE 0.90 06/06/2017 0434   CALCIUM 7.7 (L) 06/03/2017 0244   GFRNONAA >60 06/06/2017 0434   GFRAA >60 06/06/2017 0434    COAG Lab Results  Component Value Date   INR 1.17 05/30/2017   INR 0.93 05/24/2017   INR 1.14 01/08/2017   No results found for: PTT  ANTIBIOTICS:   Anti-infectives    Start     Dose/Rate Route Frequency Ordered Stop   05/30/17 2000  cefUROXime (ZINACEF) 1.5 g in dextrose 5 % 50 mL IVPB     1.5 g 100 mL/hr over 30 Minutes Intravenous Every 12 hours 05/30/17 1456 05/31/17 0856   05/30/17 0618  cefUROXime (ZINACEF) 1.5 g in dextrose 5 % 50 mL IVPB     1.5 g 100 mL/hr over 30 Minutes Intravenous 30 min pre-op 05/30/17 0618 05/30/17 0749       Maris BergerKimberly Trinh,  PA-C [facility] Office: [phone] Pager: [phone] 06/06/2017 7:27 AM   I have independently interviewed patient and agree with PA assessment and plan above. Offered snf vs rehab but patient desires to go home. Ok for discharge today.  Canaan Prue C. Randie Heinzain, MD [facility] [place] Office: [phone] Pager: [phone]

## 2017-06-06 NOTE — Telephone Encounter (Signed)
-----   Message from Sharee PimpleMarilyn K McChesney, RN sent at 06/06/2017  8:47 AM EDT ----- Regarding: 2 weeks    ----- Message ----- From: Raymond Gurneyrinh, Kimberly A, PA-C Sent: 06/06/2017   7:31 AM To: Vvs Charge Pool  S/p aortobifemoral bypass 05/30/17  F/u with Dr. Randie Heinzain 2 weeks from now.   Thanks Selena BattenKim

## 2017-06-08 DIAGNOSIS — M6281 Muscle weakness (generalized): Secondary | ICD-10-CM | POA: Diagnosis not present

## 2017-06-08 DIAGNOSIS — I739 Peripheral vascular disease, unspecified: Secondary | ICD-10-CM | POA: Diagnosis not present

## 2017-06-08 DIAGNOSIS — Z48812 Encounter for surgical aftercare following surgery on the circulatory system: Secondary | ICD-10-CM | POA: Diagnosis not present

## 2017-06-08 DIAGNOSIS — D649 Anemia, unspecified: Secondary | ICD-10-CM | POA: Diagnosis not present

## 2017-06-08 DIAGNOSIS — N183 Chronic kidney disease, stage 3 (moderate): Secondary | ICD-10-CM | POA: Diagnosis not present

## 2017-06-08 DIAGNOSIS — I129 Hypertensive chronic kidney disease with stage 1 through stage 4 chronic kidney disease, or unspecified chronic kidney disease: Secondary | ICD-10-CM | POA: Diagnosis not present

## 2017-06-08 DIAGNOSIS — E785 Hyperlipidemia, unspecified: Secondary | ICD-10-CM | POA: Diagnosis not present

## 2017-06-08 DIAGNOSIS — M5001 Cervical disc disorder with myelopathy,  high cervical region: Secondary | ICD-10-CM | POA: Diagnosis not present

## 2017-06-08 DIAGNOSIS — R2689 Other abnormalities of gait and mobility: Secondary | ICD-10-CM | POA: Diagnosis not present

## 2017-06-08 DIAGNOSIS — M810 Age-related osteoporosis without current pathological fracture: Secondary | ICD-10-CM | POA: Diagnosis not present

## 2017-06-16 DIAGNOSIS — M5001 Cervical disc disorder with myelopathy,  high cervical region: Secondary | ICD-10-CM | POA: Diagnosis not present

## 2017-06-16 DIAGNOSIS — D649 Anemia, unspecified: Secondary | ICD-10-CM | POA: Diagnosis not present

## 2017-06-16 DIAGNOSIS — R2689 Other abnormalities of gait and mobility: Secondary | ICD-10-CM | POA: Diagnosis not present

## 2017-06-16 DIAGNOSIS — M810 Age-related osteoporosis without current pathological fracture: Secondary | ICD-10-CM | POA: Diagnosis not present

## 2017-06-16 DIAGNOSIS — I129 Hypertensive chronic kidney disease with stage 1 through stage 4 chronic kidney disease, or unspecified chronic kidney disease: Secondary | ICD-10-CM | POA: Diagnosis not present

## 2017-06-16 DIAGNOSIS — Z48812 Encounter for surgical aftercare following surgery on the circulatory system: Secondary | ICD-10-CM | POA: Diagnosis not present

## 2017-06-16 DIAGNOSIS — N183 Chronic kidney disease, stage 3 (moderate): Secondary | ICD-10-CM | POA: Diagnosis not present

## 2017-06-16 DIAGNOSIS — M6281 Muscle weakness (generalized): Secondary | ICD-10-CM | POA: Diagnosis not present

## 2017-06-16 DIAGNOSIS — I739 Peripheral vascular disease, unspecified: Secondary | ICD-10-CM | POA: Diagnosis not present

## 2017-06-16 DIAGNOSIS — E785 Hyperlipidemia, unspecified: Secondary | ICD-10-CM | POA: Diagnosis not present

## 2017-06-19 DIAGNOSIS — Z89612 Acquired absence of left leg above knee: Secondary | ICD-10-CM | POA: Diagnosis not present

## 2017-06-19 DIAGNOSIS — M4712 Other spondylosis with myelopathy, cervical region: Secondary | ICD-10-CM | POA: Diagnosis not present

## 2017-06-20 ENCOUNTER — Encounter: Payer: Self-pay | Admitting: Vascular Surgery

## 2017-06-20 DIAGNOSIS — M810 Age-related osteoporosis without current pathological fracture: Secondary | ICD-10-CM | POA: Diagnosis not present

## 2017-06-20 DIAGNOSIS — I739 Peripheral vascular disease, unspecified: Secondary | ICD-10-CM | POA: Diagnosis not present

## 2017-06-20 DIAGNOSIS — M6281 Muscle weakness (generalized): Secondary | ICD-10-CM | POA: Diagnosis not present

## 2017-06-20 DIAGNOSIS — N183 Chronic kidney disease, stage 3 (moderate): Secondary | ICD-10-CM | POA: Diagnosis not present

## 2017-06-20 DIAGNOSIS — M5001 Cervical disc disorder with myelopathy,  high cervical region: Secondary | ICD-10-CM | POA: Diagnosis not present

## 2017-06-20 DIAGNOSIS — R2689 Other abnormalities of gait and mobility: Secondary | ICD-10-CM | POA: Diagnosis not present

## 2017-06-20 DIAGNOSIS — E785 Hyperlipidemia, unspecified: Secondary | ICD-10-CM | POA: Diagnosis not present

## 2017-06-20 DIAGNOSIS — D649 Anemia, unspecified: Secondary | ICD-10-CM | POA: Diagnosis not present

## 2017-06-20 DIAGNOSIS — Z48812 Encounter for surgical aftercare following surgery on the circulatory system: Secondary | ICD-10-CM | POA: Diagnosis not present

## 2017-06-20 DIAGNOSIS — I129 Hypertensive chronic kidney disease with stage 1 through stage 4 chronic kidney disease, or unspecified chronic kidney disease: Secondary | ICD-10-CM | POA: Diagnosis not present

## 2017-06-24 DIAGNOSIS — I739 Peripheral vascular disease, unspecified: Secondary | ICD-10-CM | POA: Diagnosis not present

## 2017-06-24 DIAGNOSIS — D649 Anemia, unspecified: Secondary | ICD-10-CM | POA: Diagnosis not present

## 2017-06-24 DIAGNOSIS — M5001 Cervical disc disorder with myelopathy,  high cervical region: Secondary | ICD-10-CM | POA: Diagnosis not present

## 2017-06-24 DIAGNOSIS — M810 Age-related osteoporosis without current pathological fracture: Secondary | ICD-10-CM | POA: Diagnosis not present

## 2017-06-24 DIAGNOSIS — E785 Hyperlipidemia, unspecified: Secondary | ICD-10-CM | POA: Diagnosis not present

## 2017-06-24 DIAGNOSIS — I129 Hypertensive chronic kidney disease with stage 1 through stage 4 chronic kidney disease, or unspecified chronic kidney disease: Secondary | ICD-10-CM | POA: Diagnosis not present

## 2017-06-24 DIAGNOSIS — Z48812 Encounter for surgical aftercare following surgery on the circulatory system: Secondary | ICD-10-CM | POA: Diagnosis not present

## 2017-06-24 DIAGNOSIS — R2689 Other abnormalities of gait and mobility: Secondary | ICD-10-CM | POA: Diagnosis not present

## 2017-06-24 DIAGNOSIS — N183 Chronic kidney disease, stage 3 (moderate): Secondary | ICD-10-CM | POA: Diagnosis not present

## 2017-06-24 DIAGNOSIS — M6281 Muscle weakness (generalized): Secondary | ICD-10-CM | POA: Diagnosis not present

## 2017-06-27 DIAGNOSIS — M5001 Cervical disc disorder with myelopathy,  high cervical region: Secondary | ICD-10-CM | POA: Diagnosis not present

## 2017-06-27 DIAGNOSIS — R2689 Other abnormalities of gait and mobility: Secondary | ICD-10-CM | POA: Diagnosis not present

## 2017-06-27 DIAGNOSIS — M6281 Muscle weakness (generalized): Secondary | ICD-10-CM | POA: Diagnosis not present

## 2017-06-27 DIAGNOSIS — N183 Chronic kidney disease, stage 3 (moderate): Secondary | ICD-10-CM | POA: Diagnosis not present

## 2017-06-27 DIAGNOSIS — D649 Anemia, unspecified: Secondary | ICD-10-CM | POA: Diagnosis not present

## 2017-06-27 DIAGNOSIS — I129 Hypertensive chronic kidney disease with stage 1 through stage 4 chronic kidney disease, or unspecified chronic kidney disease: Secondary | ICD-10-CM | POA: Diagnosis not present

## 2017-06-27 DIAGNOSIS — Z48812 Encounter for surgical aftercare following surgery on the circulatory system: Secondary | ICD-10-CM | POA: Diagnosis not present

## 2017-06-27 DIAGNOSIS — M810 Age-related osteoporosis without current pathological fracture: Secondary | ICD-10-CM | POA: Diagnosis not present

## 2017-06-27 DIAGNOSIS — I739 Peripheral vascular disease, unspecified: Secondary | ICD-10-CM | POA: Diagnosis not present

## 2017-06-27 DIAGNOSIS — E785 Hyperlipidemia, unspecified: Secondary | ICD-10-CM | POA: Diagnosis not present

## 2017-06-28 ENCOUNTER — Encounter: Payer: Self-pay | Admitting: Vascular Surgery

## 2017-06-28 DIAGNOSIS — M5001 Cervical disc disorder with myelopathy,  high cervical region: Secondary | ICD-10-CM | POA: Diagnosis not present

## 2017-06-28 DIAGNOSIS — Z48812 Encounter for surgical aftercare following surgery on the circulatory system: Secondary | ICD-10-CM | POA: Diagnosis not present

## 2017-06-28 DIAGNOSIS — N183 Chronic kidney disease, stage 3 (moderate): Secondary | ICD-10-CM | POA: Diagnosis not present

## 2017-06-28 DIAGNOSIS — I739 Peripheral vascular disease, unspecified: Secondary | ICD-10-CM | POA: Diagnosis not present

## 2017-06-28 DIAGNOSIS — I129 Hypertensive chronic kidney disease with stage 1 through stage 4 chronic kidney disease, or unspecified chronic kidney disease: Secondary | ICD-10-CM | POA: Diagnosis not present

## 2017-06-28 DIAGNOSIS — M810 Age-related osteoporosis without current pathological fracture: Secondary | ICD-10-CM | POA: Diagnosis not present

## 2017-06-28 DIAGNOSIS — R2689 Other abnormalities of gait and mobility: Secondary | ICD-10-CM | POA: Diagnosis not present

## 2017-06-28 DIAGNOSIS — M6281 Muscle weakness (generalized): Secondary | ICD-10-CM | POA: Diagnosis not present

## 2017-06-28 DIAGNOSIS — E785 Hyperlipidemia, unspecified: Secondary | ICD-10-CM | POA: Diagnosis not present

## 2017-06-28 DIAGNOSIS — D649 Anemia, unspecified: Secondary | ICD-10-CM | POA: Diagnosis not present

## 2017-06-29 ENCOUNTER — Encounter: Payer: Self-pay | Admitting: Vascular Surgery

## 2017-06-29 ENCOUNTER — Ambulatory Visit (INDEPENDENT_AMBULATORY_CARE_PROVIDER_SITE_OTHER): Payer: Self-pay | Admitting: Vascular Surgery

## 2017-06-29 VITALS — BP 104/64 | HR 80 | Temp 98.2°F | Resp 16 | Ht 60.0 in

## 2017-06-29 DIAGNOSIS — D649 Anemia, unspecified: Secondary | ICD-10-CM | POA: Diagnosis not present

## 2017-06-29 DIAGNOSIS — M810 Age-related osteoporosis without current pathological fracture: Secondary | ICD-10-CM | POA: Diagnosis not present

## 2017-06-29 DIAGNOSIS — I129 Hypertensive chronic kidney disease with stage 1 through stage 4 chronic kidney disease, or unspecified chronic kidney disease: Secondary | ICD-10-CM | POA: Diagnosis not present

## 2017-06-29 DIAGNOSIS — E785 Hyperlipidemia, unspecified: Secondary | ICD-10-CM | POA: Diagnosis not present

## 2017-06-29 DIAGNOSIS — R2689 Other abnormalities of gait and mobility: Secondary | ICD-10-CM | POA: Diagnosis not present

## 2017-06-29 DIAGNOSIS — M5001 Cervical disc disorder with myelopathy,  high cervical region: Secondary | ICD-10-CM | POA: Diagnosis not present

## 2017-06-29 DIAGNOSIS — I739 Peripheral vascular disease, unspecified: Secondary | ICD-10-CM | POA: Diagnosis not present

## 2017-06-29 DIAGNOSIS — N183 Chronic kidney disease, stage 3 (moderate): Secondary | ICD-10-CM | POA: Diagnosis not present

## 2017-06-29 DIAGNOSIS — Z48812 Encounter for surgical aftercare following surgery on the circulatory system: Secondary | ICD-10-CM | POA: Diagnosis not present

## 2017-06-29 DIAGNOSIS — M6281 Muscle weakness (generalized): Secondary | ICD-10-CM | POA: Diagnosis not present

## 2017-06-29 NOTE — Progress Notes (Signed)
Subjective:     Patient ID: Alison Lynch, female   DOB: 06/01/1961, 56 y.o.   MRN: [id number]  HPI 56 year old female follows up 1 month after aortobifemoral bypass grafting. She has done very well and has been at home. She has lost some weight but states she is eating. She also states she has mostly quit smoking which I congratulated on her on today. She is not having any other issues related to her recent surgery or left above-the-knee of dictation and is ready for prosthesis.   Review of Systems Agitation Weight loss    Objective:   Physical Exam aaox3 Well healed incisions Palpable right popliteal artery pulse Well healed left aka Strong right at/pt signals    Assessment/plan     [age] female status post aortobifemoral bypass grafting. Her right leg is now feeling much better and she has strong signals at the level of the ankle as well as a palpable popliteal pulse. Her left above-knee amputation is well-healed. We will get her referred for a biotech for possible prosthesis. I've congratulated her on her near smoking cessation and have discussed with her the need for nutrition. She demonstrates good understanding. We will have her follow-up in 6 months with ABI.  Javyn Havlin C. Randie Heinzain, MD [facility] [place] Office: [phone] Pager: [phone]

## 2017-06-30 DIAGNOSIS — R2689 Other abnormalities of gait and mobility: Secondary | ICD-10-CM | POA: Diagnosis not present

## 2017-06-30 DIAGNOSIS — M5001 Cervical disc disorder with myelopathy,  high cervical region: Secondary | ICD-10-CM | POA: Diagnosis not present

## 2017-06-30 DIAGNOSIS — M6281 Muscle weakness (generalized): Secondary | ICD-10-CM | POA: Diagnosis not present

## 2017-06-30 DIAGNOSIS — M810 Age-related osteoporosis without current pathological fracture: Secondary | ICD-10-CM | POA: Diagnosis not present

## 2017-06-30 DIAGNOSIS — D649 Anemia, unspecified: Secondary | ICD-10-CM | POA: Diagnosis not present

## 2017-06-30 DIAGNOSIS — I129 Hypertensive chronic kidney disease with stage 1 through stage 4 chronic kidney disease, or unspecified chronic kidney disease: Secondary | ICD-10-CM | POA: Diagnosis not present

## 2017-06-30 DIAGNOSIS — Z48812 Encounter for surgical aftercare following surgery on the circulatory system: Secondary | ICD-10-CM | POA: Diagnosis not present

## 2017-06-30 DIAGNOSIS — N183 Chronic kidney disease, stage 3 (moderate): Secondary | ICD-10-CM | POA: Diagnosis not present

## 2017-06-30 DIAGNOSIS — I739 Peripheral vascular disease, unspecified: Secondary | ICD-10-CM | POA: Diagnosis not present

## 2017-06-30 DIAGNOSIS — E785 Hyperlipidemia, unspecified: Secondary | ICD-10-CM | POA: Diagnosis not present

## 2017-07-01 DIAGNOSIS — R69 Illness, unspecified: Secondary | ICD-10-CM | POA: Diagnosis not present

## 2017-07-03 DIAGNOSIS — Z87891 Personal history of nicotine dependence: Secondary | ICD-10-CM | POA: Insufficient documentation

## 2017-07-03 HISTORY — DX: Personal history of nicotine dependence: Z87.891

## 2017-07-04 DIAGNOSIS — E785 Hyperlipidemia, unspecified: Secondary | ICD-10-CM | POA: Diagnosis not present

## 2017-07-04 DIAGNOSIS — R2689 Other abnormalities of gait and mobility: Secondary | ICD-10-CM | POA: Diagnosis not present

## 2017-07-04 DIAGNOSIS — M810 Age-related osteoporosis without current pathological fracture: Secondary | ICD-10-CM | POA: Diagnosis not present

## 2017-07-04 DIAGNOSIS — I129 Hypertensive chronic kidney disease with stage 1 through stage 4 chronic kidney disease, or unspecified chronic kidney disease: Secondary | ICD-10-CM | POA: Diagnosis not present

## 2017-07-04 DIAGNOSIS — M6281 Muscle weakness (generalized): Secondary | ICD-10-CM | POA: Diagnosis not present

## 2017-07-04 DIAGNOSIS — I739 Peripheral vascular disease, unspecified: Secondary | ICD-10-CM | POA: Diagnosis not present

## 2017-07-04 DIAGNOSIS — D649 Anemia, unspecified: Secondary | ICD-10-CM | POA: Diagnosis not present

## 2017-07-04 DIAGNOSIS — N183 Chronic kidney disease, stage 3 (moderate): Secondary | ICD-10-CM | POA: Diagnosis not present

## 2017-07-04 DIAGNOSIS — M5001 Cervical disc disorder with myelopathy,  high cervical region: Secondary | ICD-10-CM | POA: Diagnosis not present

## 2017-07-04 DIAGNOSIS — Z48812 Encounter for surgical aftercare following surgery on the circulatory system: Secondary | ICD-10-CM | POA: Diagnosis not present

## 2017-07-05 DIAGNOSIS — D649 Anemia, unspecified: Secondary | ICD-10-CM

## 2017-07-05 DIAGNOSIS — Z89612 Acquired absence of left leg above knee: Secondary | ICD-10-CM | POA: Diagnosis not present

## 2017-07-05 DIAGNOSIS — R69 Illness, unspecified: Secondary | ICD-10-CM | POA: Diagnosis not present

## 2017-07-05 DIAGNOSIS — I129 Hypertensive chronic kidney disease with stage 1 through stage 4 chronic kidney disease, or unspecified chronic kidney disease: Secondary | ICD-10-CM | POA: Diagnosis not present

## 2017-07-05 DIAGNOSIS — Z993 Dependence on wheelchair: Secondary | ICD-10-CM | POA: Diagnosis not present

## 2017-07-05 DIAGNOSIS — E785 Hyperlipidemia, unspecified: Secondary | ICD-10-CM

## 2017-07-05 DIAGNOSIS — Z4781 Encounter for orthopedic aftercare following surgical amputation: Secondary | ICD-10-CM | POA: Diagnosis not present

## 2017-07-05 DIAGNOSIS — R2689 Other abnormalities of gait and mobility: Secondary | ICD-10-CM | POA: Diagnosis not present

## 2017-07-05 DIAGNOSIS — M5000 Cervical disc disorder with myelopathy, unspecified cervical region: Secondary | ICD-10-CM | POA: Diagnosis not present

## 2017-07-05 DIAGNOSIS — Z48812 Encounter for surgical aftercare following surgery on the circulatory system: Secondary | ICD-10-CM | POA: Diagnosis not present

## 2017-07-05 DIAGNOSIS — N183 Chronic kidney disease, stage 3 (moderate): Secondary | ICD-10-CM | POA: Diagnosis not present

## 2017-07-05 DIAGNOSIS — Z7901 Long term (current) use of anticoagulants: Secondary | ICD-10-CM | POA: Diagnosis not present

## 2017-07-05 DIAGNOSIS — M810 Age-related osteoporosis without current pathological fracture: Secondary | ICD-10-CM

## 2017-07-05 DIAGNOSIS — Z9181 History of falling: Secondary | ICD-10-CM

## 2017-07-05 DIAGNOSIS — M5001 Cervical disc disorder with myelopathy,  high cervical region: Secondary | ICD-10-CM | POA: Diagnosis not present

## 2017-07-05 DIAGNOSIS — F329 Major depressive disorder, single episode, unspecified: Secondary | ICD-10-CM | POA: Diagnosis not present

## 2017-07-05 DIAGNOSIS — I739 Peripheral vascular disease, unspecified: Secondary | ICD-10-CM | POA: Diagnosis not present

## 2017-07-05 DIAGNOSIS — M6281 Muscle weakness (generalized): Secondary | ICD-10-CM | POA: Diagnosis not present

## 2017-07-06 DIAGNOSIS — M810 Age-related osteoporosis without current pathological fracture: Secondary | ICD-10-CM | POA: Diagnosis not present

## 2017-07-06 DIAGNOSIS — M5001 Cervical disc disorder with myelopathy,  high cervical region: Secondary | ICD-10-CM | POA: Diagnosis not present

## 2017-07-06 DIAGNOSIS — D649 Anemia, unspecified: Secondary | ICD-10-CM | POA: Diagnosis not present

## 2017-07-06 DIAGNOSIS — E785 Hyperlipidemia, unspecified: Secondary | ICD-10-CM | POA: Diagnosis not present

## 2017-07-06 DIAGNOSIS — N183 Chronic kidney disease, stage 3 (moderate): Secondary | ICD-10-CM | POA: Diagnosis not present

## 2017-07-06 DIAGNOSIS — R2689 Other abnormalities of gait and mobility: Secondary | ICD-10-CM | POA: Diagnosis not present

## 2017-07-06 DIAGNOSIS — M6281 Muscle weakness (generalized): Secondary | ICD-10-CM | POA: Diagnosis not present

## 2017-07-06 DIAGNOSIS — I739 Peripheral vascular disease, unspecified: Secondary | ICD-10-CM | POA: Diagnosis not present

## 2017-07-06 DIAGNOSIS — I129 Hypertensive chronic kidney disease with stage 1 through stage 4 chronic kidney disease, or unspecified chronic kidney disease: Secondary | ICD-10-CM | POA: Diagnosis not present

## 2017-07-06 DIAGNOSIS — Z48812 Encounter for surgical aftercare following surgery on the circulatory system: Secondary | ICD-10-CM | POA: Diagnosis not present

## 2017-07-07 DIAGNOSIS — I739 Peripheral vascular disease, unspecified: Secondary | ICD-10-CM | POA: Diagnosis not present

## 2017-07-07 DIAGNOSIS — E785 Hyperlipidemia, unspecified: Secondary | ICD-10-CM | POA: Diagnosis not present

## 2017-07-07 DIAGNOSIS — Z48812 Encounter for surgical aftercare following surgery on the circulatory system: Secondary | ICD-10-CM | POA: Diagnosis not present

## 2017-07-07 DIAGNOSIS — N183 Chronic kidney disease, stage 3 (moderate): Secondary | ICD-10-CM | POA: Diagnosis not present

## 2017-07-07 DIAGNOSIS — M810 Age-related osteoporosis without current pathological fracture: Secondary | ICD-10-CM | POA: Diagnosis not present

## 2017-07-07 DIAGNOSIS — I129 Hypertensive chronic kidney disease with stage 1 through stage 4 chronic kidney disease, or unspecified chronic kidney disease: Secondary | ICD-10-CM | POA: Diagnosis not present

## 2017-07-07 DIAGNOSIS — M5001 Cervical disc disorder with myelopathy,  high cervical region: Secondary | ICD-10-CM | POA: Diagnosis not present

## 2017-07-07 DIAGNOSIS — D649 Anemia, unspecified: Secondary | ICD-10-CM | POA: Diagnosis not present

## 2017-07-07 DIAGNOSIS — M6281 Muscle weakness (generalized): Secondary | ICD-10-CM | POA: Diagnosis not present

## 2017-07-07 DIAGNOSIS — R2689 Other abnormalities of gait and mobility: Secondary | ICD-10-CM | POA: Diagnosis not present

## 2017-07-11 DIAGNOSIS — M6281 Muscle weakness (generalized): Secondary | ICD-10-CM | POA: Diagnosis not present

## 2017-07-11 DIAGNOSIS — E785 Hyperlipidemia, unspecified: Secondary | ICD-10-CM | POA: Diagnosis not present

## 2017-07-11 DIAGNOSIS — M5001 Cervical disc disorder with myelopathy,  high cervical region: Secondary | ICD-10-CM | POA: Diagnosis not present

## 2017-07-11 DIAGNOSIS — I739 Peripheral vascular disease, unspecified: Secondary | ICD-10-CM | POA: Diagnosis not present

## 2017-07-11 DIAGNOSIS — D649 Anemia, unspecified: Secondary | ICD-10-CM | POA: Diagnosis not present

## 2017-07-11 DIAGNOSIS — I129 Hypertensive chronic kidney disease with stage 1 through stage 4 chronic kidney disease, or unspecified chronic kidney disease: Secondary | ICD-10-CM | POA: Diagnosis not present

## 2017-07-11 DIAGNOSIS — R2689 Other abnormalities of gait and mobility: Secondary | ICD-10-CM | POA: Diagnosis not present

## 2017-07-11 DIAGNOSIS — M810 Age-related osteoporosis without current pathological fracture: Secondary | ICD-10-CM | POA: Diagnosis not present

## 2017-07-11 DIAGNOSIS — N183 Chronic kidney disease, stage 3 (moderate): Secondary | ICD-10-CM | POA: Diagnosis not present

## 2017-07-11 DIAGNOSIS — Z48812 Encounter for surgical aftercare following surgery on the circulatory system: Secondary | ICD-10-CM | POA: Diagnosis not present

## 2017-07-15 DIAGNOSIS — R2689 Other abnormalities of gait and mobility: Secondary | ICD-10-CM | POA: Diagnosis not present

## 2017-07-15 DIAGNOSIS — E785 Hyperlipidemia, unspecified: Secondary | ICD-10-CM | POA: Diagnosis not present

## 2017-07-15 DIAGNOSIS — D649 Anemia, unspecified: Secondary | ICD-10-CM | POA: Diagnosis not present

## 2017-07-15 DIAGNOSIS — M810 Age-related osteoporosis without current pathological fracture: Secondary | ICD-10-CM | POA: Diagnosis not present

## 2017-07-15 DIAGNOSIS — Z48812 Encounter for surgical aftercare following surgery on the circulatory system: Secondary | ICD-10-CM | POA: Diagnosis not present

## 2017-07-15 DIAGNOSIS — I129 Hypertensive chronic kidney disease with stage 1 through stage 4 chronic kidney disease, or unspecified chronic kidney disease: Secondary | ICD-10-CM | POA: Diagnosis not present

## 2017-07-15 DIAGNOSIS — M6281 Muscle weakness (generalized): Secondary | ICD-10-CM | POA: Diagnosis not present

## 2017-07-15 DIAGNOSIS — N183 Chronic kidney disease, stage 3 (moderate): Secondary | ICD-10-CM | POA: Diagnosis not present

## 2017-07-15 DIAGNOSIS — M5001 Cervical disc disorder with myelopathy,  high cervical region: Secondary | ICD-10-CM | POA: Diagnosis not present

## 2017-07-15 DIAGNOSIS — I739 Peripheral vascular disease, unspecified: Secondary | ICD-10-CM | POA: Diagnosis not present

## 2017-07-15 NOTE — Addendum Note (Signed)
Addended by: Burton Apley A on: 07/15/2017 04:14 PM   Modules accepted: Orders

## 2017-07-18 DIAGNOSIS — R2689 Other abnormalities of gait and mobility: Secondary | ICD-10-CM | POA: Diagnosis not present

## 2017-07-18 DIAGNOSIS — I129 Hypertensive chronic kidney disease with stage 1 through stage 4 chronic kidney disease, or unspecified chronic kidney disease: Secondary | ICD-10-CM | POA: Diagnosis not present

## 2017-07-18 DIAGNOSIS — I739 Peripheral vascular disease, unspecified: Secondary | ICD-10-CM | POA: Diagnosis not present

## 2017-07-18 DIAGNOSIS — N183 Chronic kidney disease, stage 3 (moderate): Secondary | ICD-10-CM | POA: Diagnosis not present

## 2017-07-18 DIAGNOSIS — M6281 Muscle weakness (generalized): Secondary | ICD-10-CM | POA: Diagnosis not present

## 2017-07-18 DIAGNOSIS — E785 Hyperlipidemia, unspecified: Secondary | ICD-10-CM | POA: Diagnosis not present

## 2017-07-18 DIAGNOSIS — M810 Age-related osteoporosis without current pathological fracture: Secondary | ICD-10-CM | POA: Diagnosis not present

## 2017-07-18 DIAGNOSIS — M5001 Cervical disc disorder with myelopathy,  high cervical region: Secondary | ICD-10-CM | POA: Diagnosis not present

## 2017-07-18 DIAGNOSIS — Z48812 Encounter for surgical aftercare following surgery on the circulatory system: Secondary | ICD-10-CM | POA: Diagnosis not present

## 2017-07-18 DIAGNOSIS — D649 Anemia, unspecified: Secondary | ICD-10-CM | POA: Diagnosis not present

## 2017-07-20 DIAGNOSIS — Z89612 Acquired absence of left leg above knee: Secondary | ICD-10-CM | POA: Diagnosis not present

## 2017-07-20 DIAGNOSIS — M4712 Other spondylosis with myelopathy, cervical region: Secondary | ICD-10-CM | POA: Diagnosis not present

## 2017-07-21 NOTE — Addendum Note (Signed)
Addendum  created 07/21/17 1055 by Kipp Brood, MD   Sign clinical note

## 2017-07-22 DIAGNOSIS — N183 Chronic kidney disease, stage 3 (moderate): Secondary | ICD-10-CM | POA: Diagnosis not present

## 2017-07-22 DIAGNOSIS — I129 Hypertensive chronic kidney disease with stage 1 through stage 4 chronic kidney disease, or unspecified chronic kidney disease: Secondary | ICD-10-CM | POA: Diagnosis not present

## 2017-07-22 DIAGNOSIS — Z48812 Encounter for surgical aftercare following surgery on the circulatory system: Secondary | ICD-10-CM | POA: Diagnosis not present

## 2017-07-22 DIAGNOSIS — M6281 Muscle weakness (generalized): Secondary | ICD-10-CM | POA: Diagnosis not present

## 2017-07-22 DIAGNOSIS — R2689 Other abnormalities of gait and mobility: Secondary | ICD-10-CM | POA: Diagnosis not present

## 2017-07-22 DIAGNOSIS — D649 Anemia, unspecified: Secondary | ICD-10-CM | POA: Diagnosis not present

## 2017-07-22 DIAGNOSIS — M5001 Cervical disc disorder with myelopathy,  high cervical region: Secondary | ICD-10-CM | POA: Diagnosis not present

## 2017-07-22 DIAGNOSIS — E785 Hyperlipidemia, unspecified: Secondary | ICD-10-CM | POA: Diagnosis not present

## 2017-07-22 DIAGNOSIS — M810 Age-related osteoporosis without current pathological fracture: Secondary | ICD-10-CM | POA: Diagnosis not present

## 2017-07-22 DIAGNOSIS — I739 Peripheral vascular disease, unspecified: Secondary | ICD-10-CM | POA: Diagnosis not present

## 2017-07-28 DIAGNOSIS — I739 Peripheral vascular disease, unspecified: Secondary | ICD-10-CM | POA: Diagnosis not present

## 2017-07-28 DIAGNOSIS — I129 Hypertensive chronic kidney disease with stage 1 through stage 4 chronic kidney disease, or unspecified chronic kidney disease: Secondary | ICD-10-CM | POA: Diagnosis not present

## 2017-07-28 DIAGNOSIS — M810 Age-related osteoporosis without current pathological fracture: Secondary | ICD-10-CM | POA: Diagnosis not present

## 2017-07-28 DIAGNOSIS — R2689 Other abnormalities of gait and mobility: Secondary | ICD-10-CM | POA: Diagnosis not present

## 2017-07-28 DIAGNOSIS — D649 Anemia, unspecified: Secondary | ICD-10-CM | POA: Diagnosis not present

## 2017-07-28 DIAGNOSIS — M6281 Muscle weakness (generalized): Secondary | ICD-10-CM | POA: Diagnosis not present

## 2017-07-28 DIAGNOSIS — M5001 Cervical disc disorder with myelopathy,  high cervical region: Secondary | ICD-10-CM | POA: Diagnosis not present

## 2017-07-28 DIAGNOSIS — N183 Chronic kidney disease, stage 3 (moderate): Secondary | ICD-10-CM | POA: Diagnosis not present

## 2017-07-28 DIAGNOSIS — E785 Hyperlipidemia, unspecified: Secondary | ICD-10-CM | POA: Diagnosis not present

## 2017-07-28 DIAGNOSIS — Z48812 Encounter for surgical aftercare following surgery on the circulatory system: Secondary | ICD-10-CM | POA: Diagnosis not present

## 2017-08-02 DIAGNOSIS — I129 Hypertensive chronic kidney disease with stage 1 through stage 4 chronic kidney disease, or unspecified chronic kidney disease: Secondary | ICD-10-CM | POA: Diagnosis not present

## 2017-08-02 DIAGNOSIS — N183 Chronic kidney disease, stage 3 (moderate): Secondary | ICD-10-CM | POA: Diagnosis not present

## 2017-08-02 DIAGNOSIS — Z48812 Encounter for surgical aftercare following surgery on the circulatory system: Secondary | ICD-10-CM | POA: Diagnosis not present

## 2017-08-02 DIAGNOSIS — M5001 Cervical disc disorder with myelopathy,  high cervical region: Secondary | ICD-10-CM | POA: Diagnosis not present

## 2017-08-02 DIAGNOSIS — R2689 Other abnormalities of gait and mobility: Secondary | ICD-10-CM | POA: Diagnosis not present

## 2017-08-02 DIAGNOSIS — M810 Age-related osteoporosis without current pathological fracture: Secondary | ICD-10-CM | POA: Diagnosis not present

## 2017-08-02 DIAGNOSIS — M6281 Muscle weakness (generalized): Secondary | ICD-10-CM | POA: Diagnosis not present

## 2017-08-02 DIAGNOSIS — D649 Anemia, unspecified: Secondary | ICD-10-CM | POA: Diagnosis not present

## 2017-08-02 DIAGNOSIS — I739 Peripheral vascular disease, unspecified: Secondary | ICD-10-CM | POA: Diagnosis not present

## 2017-08-02 DIAGNOSIS — E785 Hyperlipidemia, unspecified: Secondary | ICD-10-CM | POA: Diagnosis not present

## 2017-08-04 DIAGNOSIS — M810 Age-related osteoporosis without current pathological fracture: Secondary | ICD-10-CM | POA: Diagnosis not present

## 2017-08-04 DIAGNOSIS — Z48812 Encounter for surgical aftercare following surgery on the circulatory system: Secondary | ICD-10-CM | POA: Diagnosis not present

## 2017-08-04 DIAGNOSIS — I739 Peripheral vascular disease, unspecified: Secondary | ICD-10-CM | POA: Diagnosis not present

## 2017-08-04 DIAGNOSIS — R2689 Other abnormalities of gait and mobility: Secondary | ICD-10-CM | POA: Diagnosis not present

## 2017-08-04 DIAGNOSIS — E785 Hyperlipidemia, unspecified: Secondary | ICD-10-CM | POA: Diagnosis not present

## 2017-08-04 DIAGNOSIS — N183 Chronic kidney disease, stage 3 (moderate): Secondary | ICD-10-CM | POA: Diagnosis not present

## 2017-08-04 DIAGNOSIS — D649 Anemia, unspecified: Secondary | ICD-10-CM | POA: Diagnosis not present

## 2017-08-04 DIAGNOSIS — M6281 Muscle weakness (generalized): Secondary | ICD-10-CM | POA: Diagnosis not present

## 2017-08-04 DIAGNOSIS — M5001 Cervical disc disorder with myelopathy,  high cervical region: Secondary | ICD-10-CM | POA: Diagnosis not present

## 2017-08-04 DIAGNOSIS — I129 Hypertensive chronic kidney disease with stage 1 through stage 4 chronic kidney disease, or unspecified chronic kidney disease: Secondary | ICD-10-CM | POA: Diagnosis not present

## 2017-08-10 DIAGNOSIS — S78112A Complete traumatic amputation at level between left hip and knee, initial encounter: Secondary | ICD-10-CM | POA: Diagnosis not present

## 2017-08-11 DIAGNOSIS — N183 Chronic kidney disease, stage 3 (moderate): Secondary | ICD-10-CM | POA: Diagnosis not present

## 2017-08-11 DIAGNOSIS — E785 Hyperlipidemia, unspecified: Secondary | ICD-10-CM | POA: Diagnosis not present

## 2017-08-11 DIAGNOSIS — Z681 Body mass index (BMI) 19 or less, adult: Secondary | ICD-10-CM | POA: Diagnosis not present

## 2017-08-11 DIAGNOSIS — R69 Illness, unspecified: Secondary | ICD-10-CM | POA: Diagnosis not present

## 2017-08-11 DIAGNOSIS — Z89612 Acquired absence of left leg above knee: Secondary | ICD-10-CM | POA: Diagnosis not present

## 2017-08-11 DIAGNOSIS — I739 Peripheral vascular disease, unspecified: Secondary | ICD-10-CM | POA: Diagnosis not present

## 2017-08-11 DIAGNOSIS — E039 Hypothyroidism, unspecified: Secondary | ICD-10-CM | POA: Diagnosis not present

## 2017-08-11 DIAGNOSIS — R7303 Prediabetes: Secondary | ICD-10-CM | POA: Diagnosis not present

## 2017-08-16 DIAGNOSIS — M6281 Muscle weakness (generalized): Secondary | ICD-10-CM | POA: Diagnosis not present

## 2017-08-16 DIAGNOSIS — G546 Phantom limb syndrome with pain: Secondary | ICD-10-CM | POA: Diagnosis not present

## 2017-08-16 DIAGNOSIS — M25661 Stiffness of right knee, not elsewhere classified: Secondary | ICD-10-CM | POA: Diagnosis not present

## 2017-08-16 DIAGNOSIS — M25652 Stiffness of left hip, not elsewhere classified: Secondary | ICD-10-CM | POA: Diagnosis not present

## 2017-08-16 DIAGNOSIS — Z89612 Acquired absence of left leg above knee: Secondary | ICD-10-CM | POA: Diagnosis not present

## 2017-08-16 DIAGNOSIS — R2681 Unsteadiness on feet: Secondary | ICD-10-CM | POA: Diagnosis not present

## 2017-08-16 DIAGNOSIS — R262 Difficulty in walking, not elsewhere classified: Secondary | ICD-10-CM | POA: Diagnosis not present

## 2017-08-20 DIAGNOSIS — M4712 Other spondylosis with myelopathy, cervical region: Secondary | ICD-10-CM | POA: Diagnosis not present

## 2017-08-20 DIAGNOSIS — Z89612 Acquired absence of left leg above knee: Secondary | ICD-10-CM | POA: Diagnosis not present

## 2017-08-31 DIAGNOSIS — M25661 Stiffness of right knee, not elsewhere classified: Secondary | ICD-10-CM | POA: Diagnosis not present

## 2017-08-31 DIAGNOSIS — R2681 Unsteadiness on feet: Secondary | ICD-10-CM | POA: Diagnosis not present

## 2017-08-31 DIAGNOSIS — G546 Phantom limb syndrome with pain: Secondary | ICD-10-CM | POA: Diagnosis not present

## 2017-08-31 DIAGNOSIS — R262 Difficulty in walking, not elsewhere classified: Secondary | ICD-10-CM | POA: Diagnosis not present

## 2017-08-31 DIAGNOSIS — M6281 Muscle weakness (generalized): Secondary | ICD-10-CM | POA: Diagnosis not present

## 2017-08-31 DIAGNOSIS — Z89612 Acquired absence of left leg above knee: Secondary | ICD-10-CM | POA: Diagnosis not present

## 2017-08-31 DIAGNOSIS — M25652 Stiffness of left hip, not elsewhere classified: Secondary | ICD-10-CM | POA: Diagnosis not present

## 2017-09-01 DIAGNOSIS — Z993 Dependence on wheelchair: Secondary | ICD-10-CM

## 2017-09-01 DIAGNOSIS — I739 Peripheral vascular disease, unspecified: Secondary | ICD-10-CM

## 2017-09-01 DIAGNOSIS — M5000 Cervical disc disorder with myelopathy, unspecified cervical region: Secondary | ICD-10-CM

## 2017-09-01 DIAGNOSIS — Z9181 History of falling: Secondary | ICD-10-CM

## 2017-09-01 DIAGNOSIS — D649 Anemia, unspecified: Secondary | ICD-10-CM

## 2017-09-01 DIAGNOSIS — Z4781 Encounter for orthopedic aftercare following surgical amputation: Secondary | ICD-10-CM

## 2017-09-01 DIAGNOSIS — M810 Age-related osteoporosis without current pathological fracture: Secondary | ICD-10-CM

## 2017-09-01 DIAGNOSIS — E785 Hyperlipidemia, unspecified: Secondary | ICD-10-CM

## 2017-09-01 DIAGNOSIS — R69 Illness, unspecified: Secondary | ICD-10-CM | POA: Diagnosis not present

## 2017-09-01 DIAGNOSIS — F329 Major depressive disorder, single episode, unspecified: Secondary | ICD-10-CM

## 2017-09-01 DIAGNOSIS — I129 Hypertensive chronic kidney disease with stage 1 through stage 4 chronic kidney disease, or unspecified chronic kidney disease: Secondary | ICD-10-CM

## 2017-09-01 DIAGNOSIS — Z7901 Long term (current) use of anticoagulants: Secondary | ICD-10-CM

## 2017-09-01 DIAGNOSIS — Z89612 Acquired absence of left leg above knee: Secondary | ICD-10-CM

## 2017-09-01 DIAGNOSIS — N183 Chronic kidney disease, stage 3 (moderate): Secondary | ICD-10-CM

## 2017-09-05 DIAGNOSIS — I739 Peripheral vascular disease, unspecified: Secondary | ICD-10-CM | POA: Diagnosis not present

## 2017-09-05 DIAGNOSIS — E7800 Pure hypercholesterolemia, unspecified: Secondary | ICD-10-CM | POA: Diagnosis not present

## 2017-09-05 DIAGNOSIS — E781 Pure hyperglyceridemia: Secondary | ICD-10-CM | POA: Diagnosis not present

## 2017-09-05 DIAGNOSIS — I700 Atherosclerosis of aorta: Secondary | ICD-10-CM | POA: Diagnosis not present

## 2017-09-05 DIAGNOSIS — Z89612 Acquired absence of left leg above knee: Secondary | ICD-10-CM | POA: Diagnosis not present

## 2017-09-05 DIAGNOSIS — N182 Chronic kidney disease, stage 2 (mild): Secondary | ICD-10-CM | POA: Diagnosis not present

## 2017-09-19 DIAGNOSIS — M4712 Other spondylosis with myelopathy, cervical region: Secondary | ICD-10-CM | POA: Diagnosis not present

## 2017-09-19 DIAGNOSIS — Z89612 Acquired absence of left leg above knee: Secondary | ICD-10-CM | POA: Diagnosis not present

## 2017-09-26 DIAGNOSIS — R69 Illness, unspecified: Secondary | ICD-10-CM | POA: Diagnosis not present

## 2017-09-30 DIAGNOSIS — R262 Difficulty in walking, not elsewhere classified: Secondary | ICD-10-CM | POA: Diagnosis not present

## 2017-09-30 DIAGNOSIS — M6281 Muscle weakness (generalized): Secondary | ICD-10-CM | POA: Diagnosis not present

## 2017-09-30 DIAGNOSIS — G546 Phantom limb syndrome with pain: Secondary | ICD-10-CM | POA: Diagnosis not present

## 2017-09-30 DIAGNOSIS — M25652 Stiffness of left hip, not elsewhere classified: Secondary | ICD-10-CM | POA: Diagnosis not present

## 2017-09-30 DIAGNOSIS — Z89612 Acquired absence of left leg above knee: Secondary | ICD-10-CM | POA: Diagnosis not present

## 2017-09-30 DIAGNOSIS — M25661 Stiffness of right knee, not elsewhere classified: Secondary | ICD-10-CM | POA: Diagnosis not present

## 2017-09-30 DIAGNOSIS — R2681 Unsteadiness on feet: Secondary | ICD-10-CM | POA: Diagnosis not present

## 2017-10-13 ENCOUNTER — Ambulatory Visit: Payer: Medicare HMO | Admitting: Cardiology

## 2017-10-20 DIAGNOSIS — Z89612 Acquired absence of left leg above knee: Secondary | ICD-10-CM | POA: Diagnosis not present

## 2017-10-20 DIAGNOSIS — M4712 Other spondylosis with myelopathy, cervical region: Secondary | ICD-10-CM | POA: Diagnosis not present

## 2017-10-24 DIAGNOSIS — R69 Illness, unspecified: Secondary | ICD-10-CM | POA: Diagnosis not present

## 2017-10-31 ENCOUNTER — Ambulatory Visit (INDEPENDENT_AMBULATORY_CARE_PROVIDER_SITE_OTHER): Payer: Medicare HMO | Admitting: Cardiology

## 2017-10-31 ENCOUNTER — Encounter: Payer: Self-pay | Admitting: Cardiology

## 2017-10-31 VITALS — BP 100/42 | HR 68 | Ht 61.0 in | Wt 95.8 lb

## 2017-10-31 DIAGNOSIS — E785 Hyperlipidemia, unspecified: Secondary | ICD-10-CM

## 2017-10-31 DIAGNOSIS — Z87891 Personal history of nicotine dependence: Secondary | ICD-10-CM | POA: Diagnosis not present

## 2017-10-31 DIAGNOSIS — I10 Essential (primary) hypertension: Secondary | ICD-10-CM | POA: Diagnosis not present

## 2017-10-31 DIAGNOSIS — I739 Peripheral vascular disease, unspecified: Secondary | ICD-10-CM

## 2017-10-31 DIAGNOSIS — I7409 Other arterial embolism and thrombosis of abdominal aorta: Secondary | ICD-10-CM | POA: Diagnosis not present

## 2017-10-31 NOTE — Patient Instructions (Addendum)
No change with current medications    Your physician wants you to follow-up in 12 months with Doctor's in the [place]. You will receive a reminder letter in the mail two months in advance. If you don't receive a letter, please call our office to schedule the follow-up appointment.

## 2017-10-31 NOTE — Progress Notes (Signed)
PCP: Hal Morales, NP  Clinic Note: Chief Complaint  Patient presents with  . Follow-up    Postop aortobifem bypass    HPI: Alison Lynch is a 56 y.o. female who is being seen today for pre-operatice evaluation at the request of Hal Morales, NP.([facility])  Alison Lynch was initially seen back in June 2018 for preoperative risk evaluation for additional peripheral vascular procedures after emergency procedures from February.  Recent Hospitalizations:   July 2018 for aortobifem bypass --was doing well having followed up with Dr. Randie Heinz.   She indicated that she had stopped smoking at that visit.  Studies Personally Reviewed - (if available, images/films reviewed: From Epic Chart or Care Everywhere)  Myoview stress test June 2018: Low risk normal EF.  No ischemia or infarction.  Interval History: Patient returns today for postop follow-up in great spirits.  She as usual is very comical and jovial.  She is giggling.  She is happy to have a prosthesis, even though it is only a metal prosthesis at this point.  She is at least able to try to start walking.  Hoping to get a better prosthesis in the future. She has not had any chest tightness or pressure at rest or exertion.  She seems to be in better spirits dealing with the amputation, but still says that she is quite frustrated because she cannot do what she wants to do. She denies any PND, orthopnea or edema.  She is doing 2 times a week rehab and has not had any chest discomfort or dyspnea associated with it. She denies any rapid irregular heartbeats or palpitations. She actually seems to be doing pretty well after her femoropopliteal bypass not having claudication in the right leg. She denies any TIA or amaurosis fugax symptoms.  No syncope or near syncope.  ROS: A comprehensive was performed. Review of Systems  Constitutional: Negative for chills, fever and weight loss (Finally starting to eat and supplement her meals.  She is  starting to gain back some weight and gained back her strength.).  HENT: Negative for congestion, hearing loss and nosebleeds.   Respiratory: Positive for cough and wheezing. Negative for shortness of breath.   Cardiovascular: Claudication: Right leg pain.  Gastrointestinal: Negative for blood in stool, heartburn and melena.  Genitourinary: Negative for hematuria.  Musculoskeletal: Negative for falls and joint pain.  Neurological: Positive for dizziness (She is dizzy if she doesn't eat or drink.  Trying to make a conscious effort to ensure that she does drink-- ).  Endo/Heme/Allergies: Negative for environmental allergies.  Psychiatric/Behavioral: Positive for depression. Negative for memory loss. The patient is not nervous/anxious and does not have insomnia.    I have reviewed and (if needed) personally updated the patient's problem list, medications, allergies, past medical and surgical history, social and family history.   Past Medical History:  Diagnosis Date  . Cervical myelopathy (HCC)   . 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 Depression   . Dysphagia   . Hx of migraine headaches   . Hyperlipidemia   . Hypothyroid   . Leriche syndrome (HCC) 12/2016   Chronically occluded aorta  . Osteoporosis   . PAD (peripheral artery disease) (HCC)    Status post left AKA for extensive thromboembolism, right leg has in-line flow through popliteal artery only.  Bilateral iliac stents occluded  . Panic attacks   .  PTSD (post-traumatic stress disorder)     Past Surgical History:  Procedure Laterality Date  . ABDOMINAL AORTOGRAM W/LOWER EXTREMITY Right 04/27/2017   Procedure: Abdominal Aortogram w/Lower Extremity;  Surgeon: Maeola Harmanain, Brandon Christopher, MD;  [facility];  Service: Cardiovascular;  Laterality: Right: Bilateral iliac stents occluded. Normal aortic  flow with patent hypogastric and renal arteries. Femoral arteries patent by ultrasound; right leg and showed below-knee runoff -> plan Aortobifem Bypass  . AMPUTATION Left 01/09/2017   Procedure: AMPUTATION ABOVE KNEE;  Surgeon: Maeola HarmanBrandon Christopher Cain, MD;  [facility];  Service: Vascular;  Laterality: Left;  . AORTA - BILATERAL FEMORAL ARTERY BYPASS GRAFT N/A 05/30/2017   Procedure: AORTA BIFEMORAL BYPASS GRAFT USING 14X7MMX40CM HEMASHIELD GOLD GRAFT;  Surgeon: Maeola Harmanain, Brandon Christopher, MD;  [facility];  Service: Vascular;  Laterality: N/A;  . AORTOGRAM Left 01/08/2017   Procedure: Ultrasound Guided Cannulation Left Common Femoral Artery; Aortagram;  Surgeon: Maeola HarmanBrandon Christopher Cain, MD;  [facility];  Service: Vascular;  Laterality: Left;  . ARTERY EXPLORATION Left 01/08/2017   Procedure: Left Common Femoral Artery Exploration;  Surgeon: Maeola HarmanBrandon Christopher Cain, MD;  [facility];  Service: Vascular;  Laterality: Left;  . BACK SURGERY    . EMBOLECTOMY Left 01/08/2017   Procedure: Left Lower Extremity Thromboembolectomy;  Surgeon: Maeola HarmanBrandon Christopher Cain, MD;  [facility];  Service: Vascular;  Laterality: Left;  . ENDARTERECTOMY POPLITEAL Left 01/08/2017   Procedure: Left Popliteal Endarterectomy;  Surgeon: Maeola HarmanBrandon Christopher Cain, MD;  [facility];  Service: Vascular;  Laterality: Left;  . INSERTION OF ILIAC STENT Bilateral 01/08/2017   Procedure: Bilateral Common Iliac Stent and Left Popliteal Artery Stent;  Surgeon: Maeola HarmanBrandon Christopher Cain, MD;  [facility];  Service: Vascular;  Laterality: Bilateral;  . LOWER EXTREMITY ANGIOGRAM Bilateral 01/08/2017   Procedure: Bilateral Lower Extremity Angiogram;  Surgeon: Maeola HarmanBrandon Christopher Cain, MD;  [facility];  Service: Vascular;  Laterality: Bilateral;  . PATCH ANGIOPLASTY  01/08/2017   Procedure: Patch Angioplasty Left Popliteal Artery ;  Surgeon: Maeola HarmanBrandon Christopher Cain, MD;  [facility];  Service: Vascular;;  .  TRANSTHORACIC ECHOCARDIOGRAM  12/2016   Normal EF 60-65%.  Essentially normal echocardiogram.  No significant valvular lesions.    PV Angio 01/08/17:  Chronic aortic occlusion with some acute thrombus on the left lower extremity --> substantial thromboembolectomy removed from the common femoral artery. Below the knee, there was chronically occluded tibial peroneal trunk with one vessel runoff via ATA.    Occluded aorta was stented (with stents into B Common Iliacs - Kissing Stents), reestablishing flow in the bilateral CFA.  L iliofemoral & fem-pop embolectomy; Endarterectomy of L BK Pop & TP trunk -- L Pop A covered stent  R LE - dominant runoff via Peroneal A to ankle.   L LE - able to establish runoff via ATA to ankle   Compartment fasciotomy of LLE with wound vac.  Rescue PV Procedure 01/08/17: (loss of distal pulse signal in LLE immediately following initial procedure  Contrast cutoff @ level of Pop A stent without distal flow reconstitution --> L DPA exposure revealed significant thrombus in ATA, little hope of Limb Salvage  L AKA 01/09/2017 (the patient initially refused amputation, but was noted to have necrotic muscle with continued pain and therefore with nonviable tissue was forced to undergo AKA)  PV Angio 5/302018 for pulseless right lower extremity and resting pain.  Bilateral iliac stents occluded; bilateral hypogastric arteries and renal arteries patent. No reconstitution of  both common femoral arteries;   RLE Angio runoff BK with distal washout.  B CFA patent by [place] --> plan AortoBifem Bypass.   Current Meds  Medication Sig  . aspirin EC 81 MG tablet Take 81 mg by mouth daily.  Marland Kitchen atorvastatin (LIPITOR) 80 MG tablet Take 1 tablet (80 mg total) by mouth daily.  . busPIRone (BUSPAR) 10 MG tablet Take 10-15 mg by mouth See admin instructions. Take 15 mg every morning and take 10 mg at bedtime  . clopidogrel (PLAVIX) 75 MG tablet Take 75 mg by mouth daily.  Marland Kitchen gabapentin  (NEURONTIN) 400 MG capsule Take 1 capsule (400 mg total) by mouth 3 (three) times daily.  . Levothyroxine Sodium 50 MCG CAPS Take 50 mcg by mouth daily before breakfast.  . mirtazapine (REMERON) 15 MG tablet Take 15 mg by mouth at bedtime.  Marland Kitchen ONDANSETRON HCL PO Take 40 mg by mouth daily.  . sertraline (ZOLOFT) 50 MG tablet Take 1 tablet (50 mg total) by mouth daily.  . traMADol (ULTRAM) 50 MG tablet Take 1 tablet (50 mg total) by mouth daily.    Allergies  Allergen Reactions  . Clindamycin/Lincomycin Rash    Social History   Socioeconomic History  . Marital status: Married  Tobacco Use  . Smoking status:  Recent every Day Smoker -recently quit smoking (August 2018)    Packs/day: 0.50    Years: 40.00    Pack years: 20.00  . Smokeless tobacco: Never Used  Substance and Sexual Activity  . Alcohol use: No  . Drug use: No  . Sexual activity: Not Currently  Other Topics Concern  . None  Social History Narrative   She is married. No kids. Currently disabled. No longer working. She still smokes at least a pack a day, but is hoping to quit with patches. Does not drink alcohol or use illicit drugs    family history includes Cancer in her maternal grandmother; Heart disease in her maternal grandfather.  Wt Readings from Last 3 Encounters:  10/31/17 95 lb 12.8 oz (43.5 kg)  05/30/17 93 lb 14.7 oz (42.6 kg)  05/24/17 94 lb (42.6 kg)    PHYSICAL EXAM BP (!) 100/42   Pulse 68   Ht 5\' 1"  (1.549 m)   Wt 95 lb 12.8 oz (43.5 kg)   SpO2 97%   BMI 18.10 kg/m   Physical Exam  Constitutional: She is oriented to person, place, and time. She appears well-developed. No distress.  Thin, frail.  Borderline cachectic  HENT:  Head: Normocephalic and atraumatic.  Eyes: EOM are normal.  Neck: No hepatojugular reflux and no JVD present. Carotid bruit is not present.  Cardiovascular: Normal rate, regular rhythm, normal heart sounds and intact distal pulses. Exam reveals no gallop and no  friction rub.  No murmur heard. Nondisplaced PMI  Pulmonary/Chest: Breath sounds normal. No respiratory distress. She has no wheezes. She has no rales.  Abdominal: Soft. Bowel sounds are normal. She exhibits no distension. There is no tenderness. There is no rebound.  Scaphoid abdomen  Musculoskeletal: She exhibits deformity (Left leg AKA).  Left AKA with metal prosthesis  Neurological: She is alert and oriented to person, place, and time.  Psychiatric: She has a normal mood and affect. Her behavior is normal. Judgment and thought content normal.  Nursing note and vitals reviewed.    Adult ECG Report None   Other studies Reviewed: Additional studies/ records that were reviewed today include:  Recent Labs:    Lab Results  Component Value Date   CREATININE 0.90 06/06/2017   BUN 8 06/03/2017   NA 134 (L) 06/03/2017   K 3.9 06/03/2017   CL 104 06/03/2017   CO2 22 06/03/2017   No results found for: CHOL, HDL, LDLCALC, LDLDIRECT, TRIG, CHOLHDL   ASSESSMENT / PLAN: Problem List Items Addressed This Visit    Aortoiliac occlusive disease (HCC) - Primary    Followed by Dr.  Earlyne IbaMultiple surgeries.  Now doing well.  Trying to get up and going with her prosthesis.  we need to be following her lipids. Smoking cessation counseling.       Benign essential HTN (Chronic)    Clearly not hypertensive.  Not on any medications.      Former heavy tobacco smoker (Chronic)    I congratulated her on her efforts with smoking cessation.  She quit in August.  Doing as well as she can staying away from cigarettes.  She was able to do this without using patch or any other medications.  I continued to encourage her abstinence.      Hyperlipidemia LDL goal <70 (Chronic)    She remains on atorvastatin 80 mg daily.  Needs to have close lab lipid control.  Target LDL would be less than 70 closer to 50 based on CAD equivalent.  Need for progression of disease.      Peripheral artery disease (HCC)  (Chronic)    At this point I want to let her recover from her surgery and her amputation.  She is asked to follow-up in [place] office.  My plan was for her to be seen in in 1year once our [place] office reopens.  She would need to be reassigned to a new cardiologist there. I think she probably needs to have close monitoring of her lipids and at a minimum, a coronary CT angiogram.  She has not had any active anginal symptoms so I opted to give her some time to recover.  PAD is still being managed by vascular surgery.          Current medicines are reviewed at length with the patient today. (+/- concerns) None The following changes have been made: None  Patient Instructions  No change with current medications    Your physician wants you to follow-up in 12 months with Doctor[place]. You will receive a reminder letter in the mail two months in advance. If you don't receive a letter, please call our office to schedule the follow-up appointment.    Studies Ordered:   No orders of the defined types were placed in this encounter.     Bryan Lemmaavid Wayden Schwertner, M.D., M.S. Interventional Cardiologist   Pager # [phone] Phone # [phone] [address] [place], [place] [address]

## 2017-11-01 DIAGNOSIS — M25661 Stiffness of right knee, not elsewhere classified: Secondary | ICD-10-CM | POA: Diagnosis not present

## 2017-11-01 DIAGNOSIS — R262 Difficulty in walking, not elsewhere classified: Secondary | ICD-10-CM | POA: Diagnosis not present

## 2017-11-01 DIAGNOSIS — M6281 Muscle weakness (generalized): Secondary | ICD-10-CM | POA: Diagnosis not present

## 2017-11-01 DIAGNOSIS — M25652 Stiffness of left hip, not elsewhere classified: Secondary | ICD-10-CM | POA: Diagnosis not present

## 2017-11-01 DIAGNOSIS — Z89612 Acquired absence of left leg above knee: Secondary | ICD-10-CM | POA: Diagnosis not present

## 2017-11-01 DIAGNOSIS — R2681 Unsteadiness on feet: Secondary | ICD-10-CM | POA: Diagnosis not present

## 2017-11-01 DIAGNOSIS — G546 Phantom limb syndrome with pain: Secondary | ICD-10-CM | POA: Diagnosis not present

## 2017-11-02 ENCOUNTER — Encounter: Payer: Self-pay | Admitting: Cardiology

## 2017-11-02 NOTE — Assessment & Plan Note (Signed)
Followed by Dr.  Earlyne IbaMultiple surgeries.  Now doing well.  Trying to get up and going with her prosthesis.  we need to be following her lipids. Smoking cessation counseling.

## 2017-11-02 NOTE — Assessment & Plan Note (Signed)
She remains on atorvastatin 80 mg daily.  Needs to have close lab lipid control.  Target LDL would be less than 70 closer to 50 based on CAD equivalent.  Need for progression of disease.

## 2017-11-02 NOTE — Assessment & Plan Note (Signed)
I congratulated her on her efforts with smoking cessation.  She quit in August.  Doing as well as she can staying away from cigarettes.  She was able to do this without using patch or any other medications.  I continued to encourage her abstinence.

## 2017-11-02 NOTE — Assessment & Plan Note (Signed)
Clearly not hypertensive.  Not on any medications.

## 2017-11-02 NOTE — Assessment & Plan Note (Signed)
At this point I want to let her recover from her surgery and her amputation.  She is asked to follow[place] office.  My plan was for her to be seen in in 1year once our [place] office reopens.  She would need to be reassigned to a new cardiologist there. I think she probably needs to have close monitoring of her lipids and at a minimum, a coronary CT angiogram.  She has not had any active anginal symptoms so I opted to give her some time to recover.  PAD is still being managed by vascular surgery.

## 2017-11-10 ENCOUNTER — Telehealth: Payer: Self-pay | Admitting: *Deleted

## 2017-11-10 DIAGNOSIS — E785 Hyperlipidemia, unspecified: Secondary | ICD-10-CM

## 2017-11-10 DIAGNOSIS — I10 Essential (primary) hypertension: Secondary | ICD-10-CM

## 2017-11-10 DIAGNOSIS — I739 Peripheral vascular disease, unspecified: Secondary | ICD-10-CM

## 2017-11-10 DIAGNOSIS — I7409 Other arterial embolism and thrombosis of abdominal aorta: Secondary | ICD-10-CM

## 2017-11-10 NOTE — Telephone Encounter (Signed)
Spoke to patient , per Dr Herbie BaltimoreHarding  - PATIENT NEEDS LIPIDS,CMP PATIENT AWARE WILL MAIL LAB SLIP  AND PATIENT IS ABLE TO DO LAB WORK IN [place] if she likes.

## 2017-11-19 DIAGNOSIS — M4712 Other spondylosis with myelopathy, cervical region: Secondary | ICD-10-CM | POA: Diagnosis not present

## 2017-11-19 DIAGNOSIS — Z89612 Acquired absence of left leg above knee: Secondary | ICD-10-CM | POA: Diagnosis not present

## 2017-12-01 DIAGNOSIS — M25661 Stiffness of right knee, not elsewhere classified: Secondary | ICD-10-CM | POA: Diagnosis not present

## 2017-12-01 DIAGNOSIS — Z89612 Acquired absence of left leg above knee: Secondary | ICD-10-CM | POA: Diagnosis not present

## 2017-12-01 DIAGNOSIS — M6281 Muscle weakness (generalized): Secondary | ICD-10-CM | POA: Diagnosis not present

## 2017-12-01 DIAGNOSIS — R2681 Unsteadiness on feet: Secondary | ICD-10-CM | POA: Diagnosis not present

## 2017-12-01 DIAGNOSIS — M25652 Stiffness of left hip, not elsewhere classified: Secondary | ICD-10-CM | POA: Diagnosis not present

## 2017-12-01 DIAGNOSIS — G546 Phantom limb syndrome with pain: Secondary | ICD-10-CM | POA: Diagnosis not present

## 2017-12-02 DIAGNOSIS — M25661 Stiffness of right knee, not elsewhere classified: Secondary | ICD-10-CM | POA: Diagnosis not present

## 2017-12-02 DIAGNOSIS — G546 Phantom limb syndrome with pain: Secondary | ICD-10-CM | POA: Diagnosis not present

## 2017-12-02 DIAGNOSIS — M25652 Stiffness of left hip, not elsewhere classified: Secondary | ICD-10-CM | POA: Diagnosis not present

## 2017-12-02 DIAGNOSIS — R2681 Unsteadiness on feet: Secondary | ICD-10-CM | POA: Diagnosis not present

## 2017-12-02 DIAGNOSIS — M6281 Muscle weakness (generalized): Secondary | ICD-10-CM | POA: Diagnosis not present

## 2017-12-02 DIAGNOSIS — Z89612 Acquired absence of left leg above knee: Secondary | ICD-10-CM | POA: Diagnosis not present

## 2017-12-06 DIAGNOSIS — M25661 Stiffness of right knee, not elsewhere classified: Secondary | ICD-10-CM | POA: Diagnosis not present

## 2017-12-06 DIAGNOSIS — E785 Hyperlipidemia, unspecified: Secondary | ICD-10-CM | POA: Diagnosis not present

## 2017-12-06 DIAGNOSIS — R2681 Unsteadiness on feet: Secondary | ICD-10-CM | POA: Diagnosis not present

## 2017-12-06 DIAGNOSIS — M25652 Stiffness of left hip, not elsewhere classified: Secondary | ICD-10-CM | POA: Diagnosis not present

## 2017-12-06 DIAGNOSIS — M6281 Muscle weakness (generalized): Secondary | ICD-10-CM | POA: Diagnosis not present

## 2017-12-06 DIAGNOSIS — Z89612 Acquired absence of left leg above knee: Secondary | ICD-10-CM | POA: Diagnosis not present

## 2017-12-06 DIAGNOSIS — G546 Phantom limb syndrome with pain: Secondary | ICD-10-CM | POA: Diagnosis not present

## 2017-12-06 DIAGNOSIS — I10 Essential (primary) hypertension: Secondary | ICD-10-CM | POA: Diagnosis not present

## 2017-12-06 DIAGNOSIS — I739 Peripheral vascular disease, unspecified: Secondary | ICD-10-CM | POA: Diagnosis not present

## 2017-12-06 DIAGNOSIS — I7409 Other arterial embolism and thrombosis of abdominal aorta: Secondary | ICD-10-CM | POA: Diagnosis not present

## 2017-12-07 LAB — LIPID PANEL
Chol/HDL Ratio: 4.4 ratio (ref 0.0–4.4)
Cholesterol, Total: 175 mg/dL (ref 100–199)
HDL: 40 mg/dL (ref >39)
LDL CALC: 85 mg/dL (ref 0–99)
Triglycerides: 250 mg/dL — ABNORMAL HIGH (ref 0–149)
VLDL CHOLESTEROL CAL: 50 mg/dL — AB (ref 5–40)

## 2017-12-07 LAB — COMPREHENSIVE METABOLIC PANEL
ALT: 26 IU/L (ref 0–32)
AST: 20 IU/L (ref 0–40)
Albumin/Globulin Ratio: 1.5 (ref 1.2–2.2)
Albumin: 4.4 g/dL (ref 3.5–5.5)
Alkaline Phosphatase: 84 IU/L (ref 39–117)
BILIRUBIN TOTAL: 0.3 mg/dL (ref 0.0–1.2)
BUN/Creatinine Ratio: 13 (ref 9–23)
BUN: 17 mg/dL (ref 6–24)
CO2: 24 mmol/L (ref 20–29)
Calcium: 9.7 mg/dL (ref 8.7–10.2)
Chloride: 104 mmol/L (ref 96–106)
Creatinine, Ser: 1.3 mg/dL — ABNORMAL HIGH (ref 0.57–1.00)
GFR calc Af Amer: 54 mL/min/{1.73_m2} — ABNORMAL LOW (ref >59)
GFR calc non Af Amer: 47 mL/min/{1.73_m2} — ABNORMAL LOW (ref >59)
Globulin, Total: 3 g/dL (ref 1.5–4.5)
Glucose: 85 mg/dL (ref 65–99)
Potassium: 4.1 mmol/L (ref 3.5–5.2)
SODIUM: 144 mmol/L (ref 134–144)
Total Protein: 7.4 g/dL (ref 6.0–8.5)

## 2017-12-08 DIAGNOSIS — M25652 Stiffness of left hip, not elsewhere classified: Secondary | ICD-10-CM | POA: Diagnosis not present

## 2017-12-08 DIAGNOSIS — G546 Phantom limb syndrome with pain: Secondary | ICD-10-CM | POA: Diagnosis not present

## 2017-12-08 DIAGNOSIS — R2681 Unsteadiness on feet: Secondary | ICD-10-CM | POA: Diagnosis not present

## 2017-12-08 DIAGNOSIS — Z89612 Acquired absence of left leg above knee: Secondary | ICD-10-CM | POA: Diagnosis not present

## 2017-12-08 DIAGNOSIS — M25661 Stiffness of right knee, not elsewhere classified: Secondary | ICD-10-CM | POA: Diagnosis not present

## 2017-12-08 DIAGNOSIS — M6281 Muscle weakness (generalized): Secondary | ICD-10-CM | POA: Diagnosis not present

## 2017-12-13 DIAGNOSIS — M25652 Stiffness of left hip, not elsewhere classified: Secondary | ICD-10-CM | POA: Diagnosis not present

## 2017-12-13 DIAGNOSIS — M25661 Stiffness of right knee, not elsewhere classified: Secondary | ICD-10-CM | POA: Diagnosis not present

## 2017-12-13 DIAGNOSIS — M6281 Muscle weakness (generalized): Secondary | ICD-10-CM | POA: Diagnosis not present

## 2017-12-13 DIAGNOSIS — R2681 Unsteadiness on feet: Secondary | ICD-10-CM | POA: Diagnosis not present

## 2017-12-13 DIAGNOSIS — Z89612 Acquired absence of left leg above knee: Secondary | ICD-10-CM | POA: Diagnosis not present

## 2017-12-13 DIAGNOSIS — G546 Phantom limb syndrome with pain: Secondary | ICD-10-CM | POA: Diagnosis not present

## 2017-12-14 ENCOUNTER — Telehealth: Payer: Self-pay | Admitting: *Deleted

## 2017-12-14 DIAGNOSIS — E785 Hyperlipidemia, unspecified: Secondary | ICD-10-CM

## 2017-12-14 MED ORDER — EZETIMIBE 10 MG PO TABS: 10.0000 mg | ORAL_TABLET | Freq: Every day | ORAL | 11 refills | Status: DC

## 2017-12-14 NOTE — Telephone Encounter (Signed)
-----   Message from Marykay Lexavid W Harding, MD sent at 12/09/2017 11:36 AM EST ----- Cholesterol panel does not look bad, but the LDL is not at goal.  We would like it to be less than 70, closer to 50. Triglycerides are high and that is usually a sign of much starchy foods.  I would like to see this result improved in 3 months. Plan will be to add Zetia 10 mg daily and recheck in 3-4 months.  If not adequately controlled at that time, we will need to consider Vascepa (holding off due to cost)  Bryan Lemmaavid Harding, MD

## 2017-12-14 NOTE — Telephone Encounter (Signed)
Patient made aware of results and to start Zetia 10 mg daily. She will come back in 3-4 months for repeat fasting labs. Orders have been placed. She has also been educated on decreasing the amount of starchy foods that she eats.

## 2017-12-15 DIAGNOSIS — G546 Phantom limb syndrome with pain: Secondary | ICD-10-CM | POA: Diagnosis not present

## 2017-12-15 DIAGNOSIS — M6281 Muscle weakness (generalized): Secondary | ICD-10-CM | POA: Diagnosis not present

## 2017-12-15 DIAGNOSIS — Z89612 Acquired absence of left leg above knee: Secondary | ICD-10-CM | POA: Diagnosis not present

## 2017-12-15 DIAGNOSIS — R2681 Unsteadiness on feet: Secondary | ICD-10-CM | POA: Diagnosis not present

## 2017-12-15 DIAGNOSIS — M25652 Stiffness of left hip, not elsewhere classified: Secondary | ICD-10-CM | POA: Diagnosis not present

## 2017-12-15 DIAGNOSIS — M25661 Stiffness of right knee, not elsewhere classified: Secondary | ICD-10-CM | POA: Diagnosis not present

## 2017-12-20 DIAGNOSIS — M25652 Stiffness of left hip, not elsewhere classified: Secondary | ICD-10-CM | POA: Diagnosis not present

## 2017-12-20 DIAGNOSIS — M25661 Stiffness of right knee, not elsewhere classified: Secondary | ICD-10-CM | POA: Diagnosis not present

## 2017-12-20 DIAGNOSIS — G546 Phantom limb syndrome with pain: Secondary | ICD-10-CM | POA: Diagnosis not present

## 2017-12-20 DIAGNOSIS — R2681 Unsteadiness on feet: Secondary | ICD-10-CM | POA: Diagnosis not present

## 2017-12-20 DIAGNOSIS — M6281 Muscle weakness (generalized): Secondary | ICD-10-CM | POA: Diagnosis not present

## 2017-12-20 DIAGNOSIS — Z89612 Acquired absence of left leg above knee: Secondary | ICD-10-CM | POA: Diagnosis not present

## 2017-12-21 DIAGNOSIS — Z01419 Encounter for gynecological examination (general) (routine) without abnormal findings: Secondary | ICD-10-CM | POA: Diagnosis not present

## 2017-12-21 DIAGNOSIS — Z0001 Encounter for general adult medical examination with abnormal findings: Secondary | ICD-10-CM | POA: Diagnosis not present

## 2017-12-21 DIAGNOSIS — Z89612 Acquired absence of left leg above knee: Secondary | ICD-10-CM | POA: Diagnosis not present

## 2017-12-22 DIAGNOSIS — Z89612 Acquired absence of left leg above knee: Secondary | ICD-10-CM | POA: Diagnosis not present

## 2017-12-22 DIAGNOSIS — G546 Phantom limb syndrome with pain: Secondary | ICD-10-CM | POA: Diagnosis not present

## 2017-12-22 DIAGNOSIS — M6281 Muscle weakness (generalized): Secondary | ICD-10-CM | POA: Diagnosis not present

## 2017-12-22 DIAGNOSIS — M25661 Stiffness of right knee, not elsewhere classified: Secondary | ICD-10-CM | POA: Diagnosis not present

## 2017-12-22 DIAGNOSIS — R2681 Unsteadiness on feet: Secondary | ICD-10-CM | POA: Diagnosis not present

## 2017-12-22 DIAGNOSIS — M25652 Stiffness of left hip, not elsewhere classified: Secondary | ICD-10-CM | POA: Diagnosis not present

## 2017-12-27 DIAGNOSIS — Z89612 Acquired absence of left leg above knee: Secondary | ICD-10-CM | POA: Diagnosis not present

## 2017-12-27 DIAGNOSIS — M25652 Stiffness of left hip, not elsewhere classified: Secondary | ICD-10-CM | POA: Diagnosis not present

## 2017-12-27 DIAGNOSIS — G546 Phantom limb syndrome with pain: Secondary | ICD-10-CM | POA: Diagnosis not present

## 2017-12-27 DIAGNOSIS — M6281 Muscle weakness (generalized): Secondary | ICD-10-CM | POA: Diagnosis not present

## 2017-12-27 DIAGNOSIS — R2681 Unsteadiness on feet: Secondary | ICD-10-CM | POA: Diagnosis not present

## 2017-12-27 DIAGNOSIS — M25661 Stiffness of right knee, not elsewhere classified: Secondary | ICD-10-CM | POA: Diagnosis not present

## 2017-12-29 DIAGNOSIS — M25661 Stiffness of right knee, not elsewhere classified: Secondary | ICD-10-CM | POA: Diagnosis not present

## 2017-12-29 DIAGNOSIS — G546 Phantom limb syndrome with pain: Secondary | ICD-10-CM | POA: Diagnosis not present

## 2017-12-29 DIAGNOSIS — M6281 Muscle weakness (generalized): Secondary | ICD-10-CM | POA: Diagnosis not present

## 2017-12-29 DIAGNOSIS — R2681 Unsteadiness on feet: Secondary | ICD-10-CM | POA: Diagnosis not present

## 2017-12-29 DIAGNOSIS — Z89612 Acquired absence of left leg above knee: Secondary | ICD-10-CM | POA: Diagnosis not present

## 2017-12-29 DIAGNOSIS — M25652 Stiffness of left hip, not elsewhere classified: Secondary | ICD-10-CM | POA: Diagnosis not present

## 2018-01-03 DIAGNOSIS — Z89612 Acquired absence of left leg above knee: Secondary | ICD-10-CM | POA: Diagnosis not present

## 2018-01-03 DIAGNOSIS — M25661 Stiffness of right knee, not elsewhere classified: Secondary | ICD-10-CM | POA: Diagnosis not present

## 2018-01-03 DIAGNOSIS — R2681 Unsteadiness on feet: Secondary | ICD-10-CM | POA: Diagnosis not present

## 2018-01-03 DIAGNOSIS — M25652 Stiffness of left hip, not elsewhere classified: Secondary | ICD-10-CM | POA: Diagnosis not present

## 2018-01-03 DIAGNOSIS — G546 Phantom limb syndrome with pain: Secondary | ICD-10-CM | POA: Diagnosis not present

## 2018-01-03 DIAGNOSIS — R262 Difficulty in walking, not elsewhere classified: Secondary | ICD-10-CM | POA: Diagnosis not present

## 2018-01-03 DIAGNOSIS — M6281 Muscle weakness (generalized): Secondary | ICD-10-CM | POA: Diagnosis not present

## 2018-01-05 DIAGNOSIS — R2681 Unsteadiness on feet: Secondary | ICD-10-CM | POA: Diagnosis not present

## 2018-01-05 DIAGNOSIS — R262 Difficulty in walking, not elsewhere classified: Secondary | ICD-10-CM | POA: Diagnosis not present

## 2018-01-05 DIAGNOSIS — M6281 Muscle weakness (generalized): Secondary | ICD-10-CM | POA: Diagnosis not present

## 2018-01-05 DIAGNOSIS — Z89612 Acquired absence of left leg above knee: Secondary | ICD-10-CM | POA: Diagnosis not present

## 2018-01-05 DIAGNOSIS — M25652 Stiffness of left hip, not elsewhere classified: Secondary | ICD-10-CM | POA: Diagnosis not present

## 2018-01-05 DIAGNOSIS — M25661 Stiffness of right knee, not elsewhere classified: Secondary | ICD-10-CM | POA: Diagnosis not present

## 2018-01-05 DIAGNOSIS — G546 Phantom limb syndrome with pain: Secondary | ICD-10-CM | POA: Diagnosis not present

## 2018-01-06 ENCOUNTER — Ambulatory Visit (INDEPENDENT_AMBULATORY_CARE_PROVIDER_SITE_OTHER): Payer: Medicare HMO | Admitting: Family

## 2018-01-06 ENCOUNTER — Encounter: Payer: Self-pay | Admitting: Family

## 2018-01-06 ENCOUNTER — Ambulatory Visit (HOSPITAL_COMMUNITY)
Admission: RE | Admit: 2018-01-06 | Discharge: 2018-01-06 | Disposition: A | Discharge status: Home / Self Care | Payer: Medicare HMO | Source: Ambulatory Visit | Attending: Family | Admitting: Family

## 2018-01-06 VITALS — BP 96/61 | HR 79 | Resp 18 | Ht 61.0 in | Wt 95.0 lb

## 2018-01-06 DIAGNOSIS — I779 Disorder of arteries and arterioles, unspecified: Secondary | ICD-10-CM | POA: Diagnosis not present

## 2018-01-06 DIAGNOSIS — Z89612 Acquired absence of left leg above knee: Secondary | ICD-10-CM | POA: Diagnosis not present

## 2018-01-06 DIAGNOSIS — F17200 Nicotine dependence, unspecified, uncomplicated: Secondary | ICD-10-CM | POA: Diagnosis not present

## 2018-01-06 DIAGNOSIS — I739 Peripheral vascular disease, unspecified: Secondary | ICD-10-CM | POA: Diagnosis not present

## 2018-01-06 DIAGNOSIS — Z95828 Presence of other vascular implants and grafts: Secondary | ICD-10-CM

## 2018-01-06 DIAGNOSIS — R69 Illness, unspecified: Secondary | ICD-10-CM | POA: Diagnosis not present
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
## 2018-01-06 NOTE — Patient Instructions (Addendum)
Peripheral Vascular Disease Peripheral vascular disease (PVD) is a disease of the blood vessels that are not part of your heart and brain. A simple term for PVD is poor circulation. In most cases, PVD narrows the blood vessels that carry blood from your heart to the rest of your body. This can result in a decreased supply of blood to your arms, legs, and internal organs, like your stomach or kidneys. However, it most often affects a person's lower legs and feet. There are two types of PVD.  Organic PVD. This is the more common type. It is caused by damage to the structure of blood vessels.  Functional PVD. This is caused by conditions that make blood vessels contract and tighten (spasm).  Without treatment, PVD tends to get worse over time. PVD can also lead to acute ischemic limb. This is when an arm or limb suddenly has trouble getting enough blood. This is a medical emergency. Follow these instructions at home:  Take medicines only as told by your doctor.  Do not use any tobacco products, including cigarettes, chewing tobacco, or electronic cigarettes. If you need help quitting, ask your doctor.  Lose weight if you are overweight, and maintain a healthy weight as told by your doctor.  Eat a diet that is low in fat and cholesterol. If you need help, ask your doctor.  Exercise regularly. Ask your doctor for some good activities for you.  Take good care of your feet. ? Wear comfortable shoes that fit well. ? Check your feet often for any cuts or sores. Contact a doctor if:  You have cramps in your legs while walking.  You have leg pain when you are at rest.  You have coldness in a leg or foot.  Your skin changes.  You are unable to get or have an erection (erectile dysfunction).  You have cuts or sores on your feet that are not healing. Get help right away if:  Your arm or leg turns cold and blue.  Your arms or legs become red, warm, swollen, painful, or numb.  You have  chest pain or trouble breathing.  You suddenly have weakness in your face, arm, or leg.  You become very confused or you cannot speak.  You suddenly have a very bad headache.  You suddenly cannot see. This information is not intended to replace advice given to you by your health care provider. Make sure you discuss any questions you have with your health care provider. Document Released: 02/09/2010 Document Revised: 04/22/2016 Document Reviewed: 04/25/2014 Elsevier Interactive Patient Education  2017 Elsevier Inc.     Secondhand Smoke What is secondhand smoke? Secondhand smoke is smoke that comes from burning tobacco. It could be the smoke from a cigarette, a pipe, or a cigar. Even if you are not the one smoking, secondhand smoke exposes you to the dangers of smoking. This is called involuntary, or passive, smoking. There are two types of secondhand smoke:  Sidestream smoke is the smoke that comes off the lighted end of a cigarette, pipe, or cigar. ? This type of smoke has the highest amount of cancer-causing agents (carcinogens). ? The particles in sidestream smoke are smaller. They get into your lungs more easily.  Mainstream smoke is the smoke that is exhaled by a person who is smoking. ? This type of smoke is also dangerous to your health.  How can secondhand smoke affect my health? Studies show that there is no safe level of secondhand smoke. This smoke contains   thousands of chemicals. At least 69 of them are known to cause cancer. Secondhand smoke can also cause many other health problems. It has been linked to:  Lung cancer.  Cancer of the voice box (larynx) or throat.  Cancer of the sinuses.  Brain cancer.  Bladder cancer.  Stomach cancer.  Breast cancer.  White blood cell cancers (lymphoma and leukemia).  Brain and liver tumors in children.  Heart disease and stroke in adults.  Pregnancy loss (miscarriage).  Diseases in children, such  as: ? Asthma. ? Lung infections. ? Ear infections. ? Sudden infant death syndrome (SIDS). ? Slow growth.  Where can I be at risk for exposure to secondhand smoke?  For adults, the workplace is the main source of exposure to secondhand smoke. ? Your workplace should have a policy separating smoking areas from nonsmoking areas. ? Smoking areas should have a system for ventilating and cleaning the air.  For children, the home may be the most dangerous place for exposure to secondhand smoke. ? Children who live in apartment buildings may be at risk from smoke drifting from hallways or other people's homes.  For everyone, many public places are possible sources of exposure to secondhand smoke. ? These places include restaurants, shopping centers, and parks. How can I reduce my risk for exposure to secondhand smoke? The most important thing you can do is not smoke. Discourage family members from smoking. Other ways to reduce exposure for you and your family include the following:  Keep your home smoke free.  Make sure your child care providers do not smoke.  Warn your child about the dangers of smoking and secondhand smoke.  Do not allow smoking in your car. When someone smokes in a car, all the damaging chemicals from the smoke are confined in a small area.  Avoid public places where smoking is allowed.  This information is not intended to replace advice given to you by your health care provider. Make sure you discuss any questions you have with your health care provider. Document Released: 12/23/2004 Document Revised: 10/12/2016 Document Reviewed: 03/01/2014 Elsevier Interactive Patient Education  2017 Elsevier Inc.  

## 2018-01-09 ENCOUNTER — Other Ambulatory Visit: Payer: Self-pay

## 2018-01-09 DIAGNOSIS — I739 Peripheral vascular disease, unspecified: Secondary | ICD-10-CM

## 2018-01-13 DIAGNOSIS — M6281 Muscle weakness (generalized): Secondary | ICD-10-CM | POA: Diagnosis not present

## 2018-01-13 DIAGNOSIS — G546 Phantom limb syndrome with pain: Secondary | ICD-10-CM | POA: Diagnosis not present

## 2018-01-13 DIAGNOSIS — M25652 Stiffness of left hip, not elsewhere classified: Secondary | ICD-10-CM | POA: Diagnosis not present

## 2018-01-13 DIAGNOSIS — R2681 Unsteadiness on feet: Secondary | ICD-10-CM | POA: Diagnosis not present

## 2018-01-13 DIAGNOSIS — M25661 Stiffness of right knee, not elsewhere classified: Secondary | ICD-10-CM | POA: Diagnosis not present

## 2018-01-13 DIAGNOSIS — R262 Difficulty in walking, not elsewhere classified: Secondary | ICD-10-CM | POA: Diagnosis not present

## 2018-01-13 DIAGNOSIS — Z89612 Acquired absence of left leg above knee: Secondary | ICD-10-CM | POA: Diagnosis not present

## 2018-01-18 DIAGNOSIS — Z89612 Acquired absence of left leg above knee: Secondary | ICD-10-CM | POA: Diagnosis not present

## 2018-01-18 DIAGNOSIS — M6281 Muscle weakness (generalized): Secondary | ICD-10-CM | POA: Diagnosis not present

## 2018-01-18 DIAGNOSIS — R262 Difficulty in walking, not elsewhere classified: Secondary | ICD-10-CM | POA: Diagnosis not present

## 2018-01-18 DIAGNOSIS — R2681 Unsteadiness on feet: Secondary | ICD-10-CM | POA: Diagnosis not present

## 2018-01-18 DIAGNOSIS — M25661 Stiffness of right knee, not elsewhere classified: Secondary | ICD-10-CM | POA: Diagnosis not present

## 2018-01-18 DIAGNOSIS — G546 Phantom limb syndrome with pain: Secondary | ICD-10-CM | POA: Diagnosis not present

## 2018-01-18 DIAGNOSIS — M25652 Stiffness of left hip, not elsewhere classified: Secondary | ICD-10-CM | POA: Diagnosis not present

## 2018-01-20 DIAGNOSIS — R2681 Unsteadiness on feet: Secondary | ICD-10-CM | POA: Diagnosis not present

## 2018-01-20 DIAGNOSIS — M6281 Muscle weakness (generalized): Secondary | ICD-10-CM | POA: Diagnosis not present

## 2018-01-20 DIAGNOSIS — G546 Phantom limb syndrome with pain: Secondary | ICD-10-CM | POA: Diagnosis not present

## 2018-01-20 DIAGNOSIS — M25661 Stiffness of right knee, not elsewhere classified: Secondary | ICD-10-CM | POA: Diagnosis not present

## 2018-01-20 DIAGNOSIS — M25652 Stiffness of left hip, not elsewhere classified: Secondary | ICD-10-CM | POA: Diagnosis not present

## 2018-01-20 DIAGNOSIS — Z89612 Acquired absence of left leg above knee: Secondary | ICD-10-CM | POA: Diagnosis not present

## 2018-01-20 DIAGNOSIS — R262 Difficulty in walking, not elsewhere classified: Secondary | ICD-10-CM | POA: Diagnosis not present

## 2018-01-25 DIAGNOSIS — R2681 Unsteadiness on feet: Secondary | ICD-10-CM | POA: Diagnosis not present

## 2018-01-25 DIAGNOSIS — M6281 Muscle weakness (generalized): Secondary | ICD-10-CM | POA: Diagnosis not present

## 2018-01-25 DIAGNOSIS — R262 Difficulty in walking, not elsewhere classified: Secondary | ICD-10-CM | POA: Diagnosis not present

## 2018-01-25 DIAGNOSIS — M25652 Stiffness of left hip, not elsewhere classified: Secondary | ICD-10-CM | POA: Diagnosis not present

## 2018-01-25 DIAGNOSIS — M25661 Stiffness of right knee, not elsewhere classified: Secondary | ICD-10-CM | POA: Diagnosis not present

## 2018-01-25 DIAGNOSIS — G546 Phantom limb syndrome with pain: Secondary | ICD-10-CM | POA: Diagnosis not present

## 2018-01-25 DIAGNOSIS — Z89612 Acquired absence of left leg above knee: Secondary | ICD-10-CM | POA: Diagnosis not present

## 2018-01-27 DIAGNOSIS — M19032 Primary osteoarthritis, left wrist: Secondary | ICD-10-CM | POA: Diagnosis not present

## 2018-01-27 DIAGNOSIS — Z89612 Acquired absence of left leg above knee: Secondary | ICD-10-CM | POA: Diagnosis not present

## 2018-01-27 DIAGNOSIS — M79643 Pain in unspecified hand: Secondary | ICD-10-CM | POA: Diagnosis not present

## 2018-01-27 DIAGNOSIS — R2689 Other abnormalities of gait and mobility: Secondary | ICD-10-CM | POA: Diagnosis not present

## 2018-01-27 DIAGNOSIS — G5602 Carpal tunnel syndrome, left upper limb: Secondary | ICD-10-CM | POA: Diagnosis not present

## 2018-01-27 DIAGNOSIS — M79602 Pain in left arm: Secondary | ICD-10-CM | POA: Diagnosis not present

## 2018-01-27 DIAGNOSIS — R29898 Other symptoms and signs involving the musculoskeletal system: Secondary | ICD-10-CM | POA: Diagnosis not present

## 2018-01-27 DIAGNOSIS — Z4789 Encounter for other orthopedic aftercare: Secondary | ICD-10-CM | POA: Diagnosis not present

## 2018-01-27 DIAGNOSIS — M25612 Stiffness of left shoulder, not elsewhere classified: Secondary | ICD-10-CM | POA: Diagnosis not present

## 2018-01-27 DIAGNOSIS — L905 Scar conditions and fibrosis of skin: Secondary | ICD-10-CM | POA: Diagnosis not present

## 2018-01-31 DIAGNOSIS — M79602 Pain in left arm: Secondary | ICD-10-CM | POA: Diagnosis not present

## 2018-01-31 DIAGNOSIS — G5602 Carpal tunnel syndrome, left upper limb: Secondary | ICD-10-CM | POA: Diagnosis not present

## 2018-01-31 DIAGNOSIS — R2689 Other abnormalities of gait and mobility: Secondary | ICD-10-CM | POA: Diagnosis not present

## 2018-01-31 DIAGNOSIS — M79643 Pain in unspecified hand: Secondary | ICD-10-CM | POA: Diagnosis not present

## 2018-01-31 DIAGNOSIS — M19032 Primary osteoarthritis, left wrist: Secondary | ICD-10-CM | POA: Diagnosis not present

## 2018-01-31 DIAGNOSIS — L905 Scar conditions and fibrosis of skin: Secondary | ICD-10-CM | POA: Diagnosis not present

## 2018-01-31 DIAGNOSIS — R29898 Other symptoms and signs involving the musculoskeletal system: Secondary | ICD-10-CM | POA: Diagnosis not present

## 2018-01-31 DIAGNOSIS — M25612 Stiffness of left shoulder, not elsewhere classified: Secondary | ICD-10-CM | POA: Diagnosis not present

## 2018-01-31 DIAGNOSIS — Z4789 Encounter for other orthopedic aftercare: Secondary | ICD-10-CM | POA: Diagnosis not present

## 2018-01-31 DIAGNOSIS — Z89612 Acquired absence of left leg above knee: Secondary | ICD-10-CM | POA: Diagnosis not present

## 2018-02-02 DIAGNOSIS — L905 Scar conditions and fibrosis of skin: Secondary | ICD-10-CM | POA: Diagnosis not present

## 2018-02-02 DIAGNOSIS — M79602 Pain in left arm: Secondary | ICD-10-CM | POA: Diagnosis not present

## 2018-02-02 DIAGNOSIS — R2689 Other abnormalities of gait and mobility: Secondary | ICD-10-CM | POA: Diagnosis not present

## 2018-02-02 DIAGNOSIS — Z4789 Encounter for other orthopedic aftercare: Secondary | ICD-10-CM | POA: Diagnosis not present

## 2018-02-02 DIAGNOSIS — M25612 Stiffness of left shoulder, not elsewhere classified: Secondary | ICD-10-CM | POA: Diagnosis not present

## 2018-02-02 DIAGNOSIS — M79643 Pain in unspecified hand: Secondary | ICD-10-CM | POA: Diagnosis not present

## 2018-02-02 DIAGNOSIS — M19032 Primary osteoarthritis, left wrist: Secondary | ICD-10-CM | POA: Diagnosis not present

## 2018-02-02 DIAGNOSIS — Z89612 Acquired absence of left leg above knee: Secondary | ICD-10-CM | POA: Diagnosis not present

## 2018-02-02 DIAGNOSIS — G5602 Carpal tunnel syndrome, left upper limb: Secondary | ICD-10-CM | POA: Diagnosis not present

## 2018-02-02 DIAGNOSIS — R29898 Other symptoms and signs involving the musculoskeletal system: Secondary | ICD-10-CM | POA: Diagnosis not present

## 2018-02-07 DIAGNOSIS — L905 Scar conditions and fibrosis of skin: Secondary | ICD-10-CM | POA: Diagnosis not present

## 2018-02-07 DIAGNOSIS — R29898 Other symptoms and signs involving the musculoskeletal system: Secondary | ICD-10-CM | POA: Diagnosis not present

## 2018-02-07 DIAGNOSIS — M79602 Pain in left arm: Secondary | ICD-10-CM | POA: Diagnosis not present

## 2018-02-07 DIAGNOSIS — Z4789 Encounter for other orthopedic aftercare: Secondary | ICD-10-CM | POA: Diagnosis not present

## 2018-02-07 DIAGNOSIS — G5602 Carpal tunnel syndrome, left upper limb: Secondary | ICD-10-CM | POA: Diagnosis not present

## 2018-02-07 DIAGNOSIS — M25612 Stiffness of left shoulder, not elsewhere classified: Secondary | ICD-10-CM | POA: Diagnosis not present

## 2018-02-07 DIAGNOSIS — R2689 Other abnormalities of gait and mobility: Secondary | ICD-10-CM | POA: Diagnosis not present

## 2018-02-07 DIAGNOSIS — M79643 Pain in unspecified hand: Secondary | ICD-10-CM | POA: Diagnosis not present

## 2018-02-07 DIAGNOSIS — M19032 Primary osteoarthritis, left wrist: Secondary | ICD-10-CM | POA: Diagnosis not present

## 2018-02-07 DIAGNOSIS — Z89612 Acquired absence of left leg above knee: Secondary | ICD-10-CM | POA: Diagnosis not present

## 2018-02-09 DIAGNOSIS — M25612 Stiffness of left shoulder, not elsewhere classified: Secondary | ICD-10-CM | POA: Diagnosis not present

## 2018-02-09 DIAGNOSIS — L905 Scar conditions and fibrosis of skin: Secondary | ICD-10-CM | POA: Diagnosis not present

## 2018-02-09 DIAGNOSIS — M79643 Pain in unspecified hand: Secondary | ICD-10-CM | POA: Diagnosis not present

## 2018-02-09 DIAGNOSIS — M79602 Pain in left arm: Secondary | ICD-10-CM | POA: Diagnosis not present

## 2018-02-09 DIAGNOSIS — Z4789 Encounter for other orthopedic aftercare: Secondary | ICD-10-CM | POA: Diagnosis not present

## 2018-02-09 DIAGNOSIS — G5602 Carpal tunnel syndrome, left upper limb: Secondary | ICD-10-CM | POA: Diagnosis not present

## 2018-02-09 DIAGNOSIS — R29898 Other symptoms and signs involving the musculoskeletal system: Secondary | ICD-10-CM | POA: Diagnosis not present

## 2018-02-09 DIAGNOSIS — M19032 Primary osteoarthritis, left wrist: Secondary | ICD-10-CM | POA: Diagnosis not present

## 2018-02-09 DIAGNOSIS — Z89612 Acquired absence of left leg above knee: Secondary | ICD-10-CM | POA: Diagnosis not present

## 2018-02-09 DIAGNOSIS — R2689 Other abnormalities of gait and mobility: Secondary | ICD-10-CM | POA: Diagnosis not present

## 2018-02-14 DIAGNOSIS — L905 Scar conditions and fibrosis of skin: Secondary | ICD-10-CM | POA: Diagnosis not present

## 2018-02-14 DIAGNOSIS — G5602 Carpal tunnel syndrome, left upper limb: Secondary | ICD-10-CM | POA: Diagnosis not present

## 2018-02-14 DIAGNOSIS — R2689 Other abnormalities of gait and mobility: Secondary | ICD-10-CM | POA: Diagnosis not present

## 2018-02-14 DIAGNOSIS — Z4789 Encounter for other orthopedic aftercare: Secondary | ICD-10-CM | POA: Diagnosis not present

## 2018-02-14 DIAGNOSIS — R29898 Other symptoms and signs involving the musculoskeletal system: Secondary | ICD-10-CM | POA: Diagnosis not present

## 2018-02-14 DIAGNOSIS — M79643 Pain in unspecified hand: Secondary | ICD-10-CM | POA: Diagnosis not present

## 2018-02-14 DIAGNOSIS — M25612 Stiffness of left shoulder, not elsewhere classified: Secondary | ICD-10-CM | POA: Diagnosis not present

## 2018-02-14 DIAGNOSIS — M79602 Pain in left arm: Secondary | ICD-10-CM | POA: Diagnosis not present

## 2018-02-14 DIAGNOSIS — Z89612 Acquired absence of left leg above knee: Secondary | ICD-10-CM | POA: Diagnosis not present

## 2018-02-14 DIAGNOSIS — M19032 Primary osteoarthritis, left wrist: Secondary | ICD-10-CM | POA: Diagnosis not present

## 2018-02-17 DIAGNOSIS — M25612 Stiffness of left shoulder, not elsewhere classified: Secondary | ICD-10-CM | POA: Diagnosis not present

## 2018-02-17 DIAGNOSIS — M79602 Pain in left arm: Secondary | ICD-10-CM | POA: Diagnosis not present

## 2018-02-17 DIAGNOSIS — L905 Scar conditions and fibrosis of skin: Secondary | ICD-10-CM | POA: Diagnosis not present

## 2018-02-17 DIAGNOSIS — G5602 Carpal tunnel syndrome, left upper limb: Secondary | ICD-10-CM | POA: Diagnosis not present

## 2018-02-17 DIAGNOSIS — R2689 Other abnormalities of gait and mobility: Secondary | ICD-10-CM | POA: Diagnosis not present

## 2018-02-17 DIAGNOSIS — Z89612 Acquired absence of left leg above knee: Secondary | ICD-10-CM | POA: Diagnosis not present

## 2018-02-17 DIAGNOSIS — M19032 Primary osteoarthritis, left wrist: Secondary | ICD-10-CM | POA: Diagnosis not present

## 2018-02-17 DIAGNOSIS — Z4789 Encounter for other orthopedic aftercare: Secondary | ICD-10-CM | POA: Diagnosis not present

## 2018-02-17 DIAGNOSIS — M79643 Pain in unspecified hand: Secondary | ICD-10-CM | POA: Diagnosis not present

## 2018-02-17 DIAGNOSIS — R29898 Other symptoms and signs involving the musculoskeletal system: Secondary | ICD-10-CM | POA: Diagnosis not present

## 2018-02-21 DIAGNOSIS — M79602 Pain in left arm: Secondary | ICD-10-CM | POA: Diagnosis not present

## 2018-02-21 DIAGNOSIS — R2689 Other abnormalities of gait and mobility: Secondary | ICD-10-CM | POA: Diagnosis not present

## 2018-02-21 DIAGNOSIS — M25612 Stiffness of left shoulder, not elsewhere classified: Secondary | ICD-10-CM | POA: Diagnosis not present

## 2018-02-21 DIAGNOSIS — L905 Scar conditions and fibrosis of skin: Secondary | ICD-10-CM | POA: Diagnosis not present

## 2018-02-21 DIAGNOSIS — Z89612 Acquired absence of left leg above knee: Secondary | ICD-10-CM | POA: Diagnosis not present

## 2018-02-21 DIAGNOSIS — R29898 Other symptoms and signs involving the musculoskeletal system: Secondary | ICD-10-CM | POA: Diagnosis not present

## 2018-02-21 DIAGNOSIS — M19032 Primary osteoarthritis, left wrist: Secondary | ICD-10-CM | POA: Diagnosis not present

## 2018-02-21 DIAGNOSIS — M79643 Pain in unspecified hand: Secondary | ICD-10-CM | POA: Diagnosis not present

## 2018-02-21 DIAGNOSIS — G5602 Carpal tunnel syndrome, left upper limb: Secondary | ICD-10-CM | POA: Diagnosis not present

## 2018-02-21 DIAGNOSIS — Z4789 Encounter for other orthopedic aftercare: Secondary | ICD-10-CM | POA: Diagnosis not present

## 2018-02-23 DIAGNOSIS — H5203 Hypermetropia, bilateral: Secondary | ICD-10-CM | POA: Diagnosis not present

## 2018-02-23 DIAGNOSIS — H524 Presbyopia: Secondary | ICD-10-CM | POA: Diagnosis not present

## 2018-02-23 DIAGNOSIS — H52209 Unspecified astigmatism, unspecified eye: Secondary | ICD-10-CM | POA: Diagnosis not present

## 2018-03-16 DIAGNOSIS — R69 Illness, unspecified: Secondary | ICD-10-CM | POA: Diagnosis not present

## 2018-03-20 DIAGNOSIS — E781 Pure hyperglyceridemia: Secondary | ICD-10-CM | POA: Diagnosis not present

## 2018-03-20 DIAGNOSIS — Z89612 Acquired absence of left leg above knee: Secondary | ICD-10-CM | POA: Diagnosis not present

## 2018-03-20 DIAGNOSIS — I700 Atherosclerosis of aorta: Secondary | ICD-10-CM | POA: Diagnosis not present

## 2018-03-20 DIAGNOSIS — N182 Chronic kidney disease, stage 2 (mild): Secondary | ICD-10-CM | POA: Diagnosis not present

## 2018-03-20 DIAGNOSIS — I739 Peripheral vascular disease, unspecified: Secondary | ICD-10-CM | POA: Diagnosis not present

## 2018-03-20 DIAGNOSIS — E7800 Pure hypercholesterolemia, unspecified: Secondary | ICD-10-CM | POA: Diagnosis not present

## 2018-04-06 DIAGNOSIS — Z89612 Acquired absence of left leg above knee: Secondary | ICD-10-CM | POA: Diagnosis not present

## 2018-04-06 DIAGNOSIS — I739 Peripheral vascular disease, unspecified: Secondary | ICD-10-CM | POA: Diagnosis not present

## 2018-04-19 ENCOUNTER — Emergency Department (HOSPITAL_COMMUNITY): Payer: Medicare HMO | Admitting: Certified Registered Nurse Anesthetist

## 2018-04-19 ENCOUNTER — Encounter (HOSPITAL_COMMUNITY)
Admission: EM | Disposition: A | Discharge status: Home / Self Care | Payer: Self-pay | Source: Home / Self Care | Attending: Vascular Surgery

## 2018-04-19 ENCOUNTER — Other Ambulatory Visit: Payer: Self-pay

## 2018-04-19 ENCOUNTER — Inpatient Hospital Stay (HOSPITAL_COMMUNITY)
Admission: EM | Admit: 2018-04-19 | Discharge: 2018-04-25 | DRG: 271 | Disposition: A | Discharge status: Home / Self Care | Payer: Medicare HMO | Attending: Vascular Surgery | Admitting: Vascular Surgery

## 2018-04-19 ENCOUNTER — Encounter (HOSPITAL_COMMUNITY): Payer: Self-pay

## 2018-04-19 DIAGNOSIS — E785 Hyperlipidemia, unspecified: Secondary | ICD-10-CM | POA: Diagnosis present

## 2018-04-19 DIAGNOSIS — Z8669 Personal history of other diseases of the nervous system and sense organs: Secondary | ICD-10-CM

## 2018-04-19 DIAGNOSIS — Y848 Other medical procedures as the cause of abnormal reaction of the patient, or of later complication, without mention of misadventure at the time of the procedure: Secondary | ICD-10-CM | POA: Diagnosis not present

## 2018-04-19 DIAGNOSIS — I739 Peripheral vascular disease, unspecified: Secondary | ICD-10-CM | POA: Diagnosis present

## 2018-04-19 DIAGNOSIS — E039 Hypothyroidism, unspecified: Secondary | ICD-10-CM | POA: Diagnosis not present

## 2018-04-19 DIAGNOSIS — N2889 Other specified disorders of kidney and ureter: Secondary | ICD-10-CM | POA: Diagnosis present

## 2018-04-19 DIAGNOSIS — Z79891 Long term (current) use of opiate analgesic: Secondary | ICD-10-CM

## 2018-04-19 DIAGNOSIS — S78112A Complete traumatic amputation at level between left hip and knee, initial encounter: Secondary | ICD-10-CM

## 2018-04-19 DIAGNOSIS — F329 Major depressive disorder, single episode, unspecified: Secondary | ICD-10-CM | POA: Diagnosis present

## 2018-04-19 DIAGNOSIS — G959 Disease of spinal cord, unspecified: Secondary | ICD-10-CM | POA: Diagnosis not present

## 2018-04-19 DIAGNOSIS — D62 Acute posthemorrhagic anemia: Secondary | ICD-10-CM | POA: Diagnosis not present

## 2018-04-19 DIAGNOSIS — F410 Panic disorder [episodic paroxysmal anxiety] without agoraphobia: Secondary | ICD-10-CM | POA: Diagnosis present

## 2018-04-19 DIAGNOSIS — Z95820 Peripheral vascular angioplasty status with implants and grafts: Secondary | ICD-10-CM

## 2018-04-19 DIAGNOSIS — Z79899 Other long term (current) drug therapy: Secondary | ICD-10-CM

## 2018-04-19 DIAGNOSIS — N183 Chronic kidney disease, stage 3 (moderate): Secondary | ICD-10-CM | POA: Diagnosis present

## 2018-04-19 DIAGNOSIS — T8189XA Other complications of procedures, not elsewhere classified, initial encounter: Secondary | ICD-10-CM | POA: Diagnosis not present

## 2018-04-19 DIAGNOSIS — I7409 Other arterial embolism and thrombosis of abdominal aorta: Secondary | ICD-10-CM | POA: Diagnosis not present

## 2018-04-19 DIAGNOSIS — Y92230 Patient room in hospital as the place of occurrence of the external cause: Secondary | ICD-10-CM | POA: Diagnosis not present

## 2018-04-19 DIAGNOSIS — Z9889 Other specified postprocedural states: Secondary | ICD-10-CM | POA: Diagnosis not present

## 2018-04-19 DIAGNOSIS — I745 Embolism and thrombosis of iliac artery: Secondary | ICD-10-CM

## 2018-04-19 DIAGNOSIS — R200 Anesthesia of skin: Secondary | ICD-10-CM | POA: Diagnosis present

## 2018-04-19 DIAGNOSIS — I743 Embolism and thrombosis of arteries of the lower extremities: Secondary | ICD-10-CM | POA: Diagnosis not present

## 2018-04-19 DIAGNOSIS — Z87891 Personal history of nicotine dependence: Secondary | ICD-10-CM | POA: Diagnosis not present

## 2018-04-19 DIAGNOSIS — Z7982 Long term (current) use of aspirin: Secondary | ICD-10-CM | POA: Diagnosis not present

## 2018-04-19 DIAGNOSIS — F4310 Post-traumatic stress disorder, unspecified: Secondary | ICD-10-CM | POA: Diagnosis present

## 2018-04-19 DIAGNOSIS — R7989 Other specified abnormal findings of blood chemistry: Secondary | ICD-10-CM | POA: Diagnosis present

## 2018-04-19 DIAGNOSIS — I7410 Embolism and thrombosis of unspecified parts of aorta: Secondary | ICD-10-CM | POA: Diagnosis not present

## 2018-04-19 DIAGNOSIS — I700 Atherosclerosis of aorta: Secondary | ICD-10-CM | POA: Diagnosis not present

## 2018-04-19 DIAGNOSIS — Z95828 Presence of other vascular implants and grafts: Secondary | ICD-10-CM

## 2018-04-19 DIAGNOSIS — M810 Age-related osteoporosis without current pathological fracture: Secondary | ICD-10-CM | POA: Diagnosis present

## 2018-04-19 DIAGNOSIS — Z7902 Long term (current) use of antithrombotics/antiplatelets: Secondary | ICD-10-CM

## 2018-04-19 DIAGNOSIS — Z89612 Acquired absence of left leg above knee: Secondary | ICD-10-CM | POA: Diagnosis not present

## 2018-04-19 DIAGNOSIS — T82868A Thrombosis of vascular prosthetic devices, implants and grafts, initial encounter: Principal | ICD-10-CM | POA: Diagnosis present

## 2018-04-19 DIAGNOSIS — N179 Acute kidney failure, unspecified: Secondary | ICD-10-CM | POA: Diagnosis not present

## 2018-04-19 DIAGNOSIS — T82898A Other specified complication of vascular prosthetic devices, implants and grafts, initial encounter: Secondary | ICD-10-CM | POA: Diagnosis not present

## 2018-04-19 DIAGNOSIS — I998 Other disorder of circulatory system: Secondary | ICD-10-CM | POA: Diagnosis present

## 2018-04-19 DIAGNOSIS — N280 Ischemia and infarction of kidney: Secondary | ICD-10-CM | POA: Diagnosis not present

## 2018-04-19 DIAGNOSIS — I129 Hypertensive chronic kidney disease with stage 1 through stage 4 chronic kidney disease, or unspecified chronic kidney disease: Secondary | ICD-10-CM | POA: Diagnosis not present

## 2018-04-19 DIAGNOSIS — I6789 Other cerebrovascular disease: Secondary | ICD-10-CM | POA: Diagnosis not present

## 2018-04-19 DIAGNOSIS — Z89512 Acquired absence of left leg below knee: Secondary | ICD-10-CM | POA: Diagnosis not present

## 2018-04-19 HISTORY — PX: ABDOMINAL AORTOGRAM: CATH118222

## 2018-04-19 HISTORY — DX: Thrombosis due to vascular prosthetic devices, implants and grafts, initial encounter: T82.868A

## 2018-04-19 HISTORY — PX: ABDOMINAL AORTIC ANEURYSM REPAIR: SHX42

## 2018-04-19 HISTORY — DX: Chronic kidney disease, stage 3 (moderate): N18.3

## 2018-04-19 HISTORY — DX: Embolism and thrombosis of iliac artery: I74.5

## 2018-04-19 HISTORY — DX: Chronic kidney disease, stage 3 unspecified: N18.30

## 2018-04-19 LAB — CBC WITH DIFFERENTIAL/PLATELET
Abs Immature Granulocytes: 0.1 10*3/uL (ref 0.0–0.1)
Basophils Absolute: 0.1 10*3/uL (ref 0.0–0.1)
Basophils Relative: 0 %
EOS PCT: 0 %
Eosinophils Absolute: 0 10*3/uL (ref 0.0–0.7)
HCT: 35.9 % — ABNORMAL LOW (ref 36.0–46.0)
Hemoglobin: 11.6 g/dL — ABNORMAL LOW (ref 12.0–15.0)
Immature Granulocytes: 1 %
Lymphocytes Relative: 3 %
Lymphs Abs: 0.4 10*3/uL — ABNORMAL LOW (ref 0.7–4.0)
MCH: 28.9 pg (ref 26.0–34.0)
MCHC: 32.3 g/dL (ref 30.0–36.0)
MCV: 89.3 fL (ref 78.0–100.0)
Monocytes Absolute: 0.4 10*3/uL (ref 0.1–1.0)
Monocytes Relative: 3 %
Neutro Abs: 13.8 10*3/uL — ABNORMAL HIGH (ref 1.7–7.7)
Neutrophils Relative %: 93 %
Platelets: 316 10*3/uL (ref 150–400)
RBC: 4.02 MIL/uL (ref 3.87–5.11)
RDW: 12.7 % (ref 11.5–15.5)
WBC: 14.7 10*3/uL — AB (ref 4.0–10.5)

## 2018-04-19 LAB — BASIC METABOLIC PANEL
ANION GAP: 9 (ref 5–15)
BUN: 8 mg/dL (ref 6–20)
CO2: 23 mmol/L (ref 22–32)
Calcium: 8.6 mg/dL — ABNORMAL LOW (ref 8.9–10.3)
Chloride: 104 mmol/L (ref 101–111)
Creatinine, Ser: 1.64 mg/dL — ABNORMAL HIGH (ref 0.44–1.00)
GFR calc Af Amer: 40 mL/min — ABNORMAL LOW (ref >60)
GFR, EST NON AFRICAN AMERICAN: 34 mL/min — AB (ref >60)
Glucose, Bld: 130 mg/dL — ABNORMAL HIGH (ref 65–99)
POTASSIUM: 3.2 mmol/L — AB (ref 3.5–5.1)
SODIUM: 136 mmol/L (ref 135–145)

## 2018-04-19 LAB — PROTIME-INR
INR: 1.15
PROTHROMBIN TIME: 14.7 s (ref 11.4–15.2)

## 2018-04-19 LAB — GLUCOSE, CAPILLARY: Glucose-Capillary: 107 mg/dL — ABNORMAL HIGH (ref 65–99)

## 2018-04-19 LAB — APTT: aPTT: 97 seconds — ABNORMAL HIGH (ref 24–36)

## 2018-04-19 SURGERY — ANEURYSM ABDOMINAL AORTIC REPAIR
Anesthesia: General | Site: Abdomen

## 2018-04-19 MED ORDER — MAGNESIUM SULFATE 2 GM/50ML IV SOLN: 2.0000 g | Freq: Every day | INTRAVENOUS | Status: DC | PRN

## 2018-04-19 MED ORDER — HEPARIN SODIUM (PORCINE) 1000 UNIT/ML IJ SOLN
INTRAMUSCULAR | Status: DC | PRN
  Administered 2018-04-19: 5000 [IU] via INTRAVENOUS

## 2018-04-19 MED ORDER — PROPOFOL 10 MG/ML IV BOLUS
INTRAVENOUS | Status: DC | PRN
  Administered 2018-04-19: 90 mg via INTRAVENOUS

## 2018-04-19 MED ORDER — HEPARIN SODIUM (PORCINE) 5000 UNIT/ML IJ SOLN
INTRAMUSCULAR | Status: DC | PRN
  Administered 2018-04-19: 15:00:00

## 2018-04-19 MED ORDER — FENTANYL CITRATE (PF) 250 MCG/5ML IJ SOLN
INTRAMUSCULAR | Status: AC
  Filled 2018-04-19: qty 5

## 2018-04-19 MED ORDER — OXYCODONE HCL 5 MG PO TABS: 5.0000 mg | ORAL_TABLET | Freq: Once | ORAL | Status: DC | PRN

## 2018-04-19 MED ORDER — OXYCODONE HCL 5 MG/5ML PO SOLN: 5.0000 mg | Freq: Once | ORAL | Status: DC | PRN

## 2018-04-19 MED ORDER — LURASIDONE HCL 20 MG PO TABS
20.0000 mg | ORAL_TABLET | Freq: Every day | ORAL | Status: DC
  Administered 2018-04-20 – 2018-04-25 (×6): 20 mg via ORAL
  Filled 2018-04-19 (×6): qty 1

## 2018-04-19 MED ORDER — BUSPIRONE HCL 15 MG PO TABS
15.0000 mg | ORAL_TABLET | Freq: Every day | ORAL | Status: DC
  Administered 2018-04-20 – 2018-04-25 (×6): 15 mg via ORAL
  Filled 2018-04-19 (×5): qty 1
  Filled 2018-04-19: qty 3

## 2018-04-19 MED ORDER — ONDANSETRON HCL 4 MG/2ML IJ SOLN
INTRAMUSCULAR | Status: AC
  Filled 2018-04-19: qty 2

## 2018-04-19 MED ORDER — LIDOCAINE 2% (20 MG/ML) 5 ML SYRINGE
INTRAMUSCULAR | Status: AC
Start: 2018-04-19 — End: ?
  Filled 2018-04-19: qty 5

## 2018-04-19 MED ORDER — 0.9 % SODIUM CHLORIDE (POUR BTL) OPTIME
TOPICAL | Status: DC | PRN
  Administered 2018-04-19 (×2): 1000 mL

## 2018-04-19 MED ORDER — MIDAZOLAM HCL 2 MG/2ML IJ SOLN
INTRAMUSCULAR | Status: DC | PRN
  Administered 2018-04-19 (×2): 1 mg via INTRAVENOUS

## 2018-04-19 MED ORDER — FENTANYL CITRATE (PF) 100 MCG/2ML IJ SOLN
INTRAMUSCULAR | Status: DC | PRN
  Administered 2018-04-19 (×2): 50 ug via INTRAVENOUS
  Administered 2018-04-19: 100 ug via INTRAVENOUS
  Administered 2018-04-19 (×6): 50 ug via INTRAVENOUS

## 2018-04-19 MED ORDER — HEPARIN SODIUM (PORCINE) 5000 UNIT/ML IJ SOLN: 5000.0000 [IU] | Freq: Three times a day (TID) | INTRAMUSCULAR | Status: DC

## 2018-04-19 MED ORDER — PANTOPRAZOLE SODIUM 40 MG PO TBEC
40.0000 mg | DELAYED_RELEASE_TABLET | Freq: Every day | ORAL | Status: DC
  Administered 2018-04-20 – 2018-04-25 (×6): 40 mg via ORAL
  Filled 2018-04-19 (×6): qty 1

## 2018-04-19 MED ORDER — SODIUM CHLORIDE 0.9 % IV SOLN
500.0000 mL | Freq: Once | INTRAVENOUS | Status: AC | PRN
  Administered 2018-04-19: 500 mL via INTRAVENOUS

## 2018-04-19 MED ORDER — ACETAMINOPHEN 325 MG RE SUPP: 325.0000 mg | RECTAL | Status: DC | PRN

## 2018-04-19 MED ORDER — ROCURONIUM BROMIDE 10 MG/ML (PF) SYRINGE
PREFILLED_SYRINGE | INTRAVENOUS | Status: AC
  Filled 2018-04-19: qty 5

## 2018-04-19 MED ORDER — CEFAZOLIN SODIUM-DEXTROSE 2-3 GM-%(50ML) IV SOLR
INTRAVENOUS | Status: DC | PRN
  Administered 2018-04-19: 2 g via INTRAVENOUS

## 2018-04-19 MED ORDER — DEXAMETHASONE SODIUM PHOSPHATE 10 MG/ML IJ SOLN
INTRAMUSCULAR | Status: AC
  Filled 2018-04-19: qty 1

## 2018-04-19 MED ORDER — FENTANYL CITRATE (PF) 100 MCG/2ML IJ SOLN: 25.0000 ug | INTRAMUSCULAR | Status: DC | PRN

## 2018-04-19 MED ORDER — GUAIFENESIN-DM 100-10 MG/5ML PO SYRP: 15.0000 mL | ORAL_SOLUTION | ORAL | Status: DC | PRN

## 2018-04-19 MED ORDER — ACETAMINOPHEN 325 MG PO TABS: 325.0000 mg | ORAL_TABLET | ORAL | Status: DC | PRN

## 2018-04-19 MED ORDER — DEXAMETHASONE SODIUM PHOSPHATE 10 MG/ML IJ SOLN
INTRAMUSCULAR | Status: DC | PRN
  Administered 2018-04-19: 10 mg via INTRAVENOUS

## 2018-04-19 MED ORDER — EZETIMIBE 10 MG PO TABS
10.0000 mg | ORAL_TABLET | Freq: Every day | ORAL | Status: DC
  Administered 2018-04-20 – 2018-04-25 (×6): 10 mg via ORAL
  Filled 2018-04-19 (×6): qty 1

## 2018-04-19 MED ORDER — NOREPINEPHRINE BITARTRATE 1 MG/ML IV SOLN
0.0000 ug/min | INTRAVENOUS | Status: DC
  Filled 2018-04-19 (×2): qty 4

## 2018-04-19 MED ORDER — ALUM & MAG HYDROXIDE-SIMETH 200-200-20 MG/5ML PO SUSP: 15.0000 mL | ORAL | Status: DC | PRN

## 2018-04-19 MED ORDER — BISACODYL 10 MG RE SUPP: 10.0000 mg | Freq: Every day | RECTAL | Status: DC | PRN

## 2018-04-19 MED ORDER — DOCUSATE SODIUM 100 MG PO CAPS
100.0000 mg | ORAL_CAPSULE | Freq: Every day | ORAL | Status: DC
  Administered 2018-04-20 – 2018-04-24 (×4): 100 mg via ORAL
  Filled 2018-04-19 (×6): qty 1

## 2018-04-19 MED ORDER — MIDAZOLAM HCL 2 MG/2ML IJ SOLN
INTRAMUSCULAR | Status: AC
  Filled 2018-04-19: qty 2

## 2018-04-19 MED ORDER — ATORVASTATIN CALCIUM 80 MG PO TABS
80.0000 mg | ORAL_TABLET | Freq: Every day | ORAL | Status: DC
  Administered 2018-04-20 – 2018-04-25 (×6): 80 mg via ORAL
  Filled 2018-04-19 (×2): qty 1
  Filled 2018-04-19: qty 2
  Filled 2018-04-19 (×4): qty 1

## 2018-04-19 MED ORDER — ONDANSETRON HCL 4 MG/2ML IJ SOLN
4.0000 mg | Freq: Once | INTRAMUSCULAR | Status: AC | PRN
  Administered 2018-04-19: 4 mg via INTRAVENOUS

## 2018-04-19 MED ORDER — HEPARIN (PORCINE) IN NACL 100-0.45 UNIT/ML-% IJ SOLN
800.0000 [IU]/h | INTRAMUSCULAR | Status: DC
  Administered 2018-04-19: 800 [IU]/h via INTRAVENOUS
  Filled 2018-04-19: qty 250

## 2018-04-19 MED ORDER — SODIUM CHLORIDE 0.9 % IV SOLN
INTRAVENOUS | Status: AC
  Filled 2018-04-19: qty 1.2

## 2018-04-19 MED ORDER — BUSPIRONE HCL 10 MG PO TABS
10.0000 mg | ORAL_TABLET | Freq: Every day | ORAL | Status: DC
  Administered 2018-04-19 – 2018-04-24 (×6): 10 mg via ORAL
  Filled 2018-04-19 (×3): qty 1
  Filled 2018-04-19: qty 2
  Filled 2018-04-19 (×2): qty 1

## 2018-04-19 MED ORDER — HEPARIN BOLUS VIA INFUSION
3000.0000 [IU] | Freq: Once | INTRAVENOUS | Status: DC
  Filled 2018-04-19: qty 3000

## 2018-04-19 MED ORDER — DOPAMINE-DEXTROSE 3.2-5 MG/ML-% IV SOLN: 0.0000 ug/kg/min | INTRAVENOUS | Status: DC

## 2018-04-19 MED ORDER — MORPHINE SULFATE (PF) 4 MG/ML IV SOLN
4.0000 mg | Freq: Once | INTRAVENOUS | Status: AC
  Administered 2018-04-19: 4 mg via INTRAVENOUS
  Filled 2018-04-19: qty 1

## 2018-04-19 MED ORDER — SUGAMMADEX SODIUM 200 MG/2ML IV SOLN
INTRAVENOUS | Status: AC
  Filled 2018-04-19: qty 2

## 2018-04-19 MED ORDER — PHENOL 1.4 % MT LIQD: 1.0000 | OROMUCOSAL | Status: DC | PRN

## 2018-04-19 MED ORDER — HEPARIN SODIUM (PORCINE) 1000 UNIT/ML IJ SOLN
INTRAMUSCULAR | Status: AC
  Filled 2018-04-19: qty 1

## 2018-04-19 MED ORDER — POLYETHYLENE GLYCOL 3350 17 G PO PACK: 17.0000 g | PACK | Freq: Every day | ORAL | Status: DC | PRN

## 2018-04-19 MED ORDER — ONDANSETRON HCL 4 MG/2ML IJ SOLN: 4.0000 mg | Freq: Once | INTRAMUSCULAR | Status: DC | PRN

## 2018-04-19 MED ORDER — ONDANSETRON HCL 4 MG/2ML IJ SOLN
4.0000 mg | Freq: Four times a day (QID) | INTRAMUSCULAR | Status: DC | PRN
  Administered 2018-04-21: 4 mg via INTRAVENOUS
  Filled 2018-04-19 (×2): qty 2

## 2018-04-19 MED ORDER — LABETALOL HCL 5 MG/ML IV SOLN
10.0000 mg | INTRAVENOUS | Status: DC | PRN
  Filled 2018-04-19: qty 4

## 2018-04-19 MED ORDER — POTASSIUM CHLORIDE CRYS ER 20 MEQ PO TBCR
20.0000 meq | EXTENDED_RELEASE_TABLET | Freq: Every day | ORAL | Status: AC | PRN
  Administered 2018-04-21: 20 meq via ORAL
  Filled 2018-04-19: qty 1

## 2018-04-19 MED ORDER — SERTRALINE HCL 50 MG PO TABS
50.0000 mg | ORAL_TABLET | Freq: Every day | ORAL | Status: DC
  Administered 2018-04-20 – 2018-04-25 (×6): 50 mg via ORAL
  Filled 2018-04-19 (×6): qty 1

## 2018-04-19 MED ORDER — SUGAMMADEX SODIUM 200 MG/2ML IV SOLN
INTRAVENOUS | Status: DC | PRN
  Administered 2018-04-19: 200 mg via INTRAVENOUS

## 2018-04-19 MED ORDER — SODIUM CHLORIDE 0.9 % IV BOLUS
500.0000 mL | Freq: Once | INTRAVENOUS | Status: AC
  Administered 2018-04-19: 500 mL via INTRAVENOUS

## 2018-04-19 MED ORDER — SODIUM CHLORIDE 0.9 % IV SOLN
INTRAVENOUS | Status: DC
  Administered 2018-04-19: 22:00:00 via INTRAVENOUS

## 2018-04-19 MED ORDER — LACTATED RINGERS IV SOLN
INTRAVENOUS | Status: DC
  Administered 2018-04-19: 14:00:00 via INTRAVENOUS

## 2018-04-19 MED ORDER — ASPIRIN EC 81 MG PO TBEC
81.0000 mg | DELAYED_RELEASE_TABLET | Freq: Every day | ORAL | Status: DC
  Administered 2018-04-20 – 2018-04-25 (×6): 81 mg via ORAL
  Filled 2018-04-19 (×6): qty 1

## 2018-04-19 MED ORDER — IODIXANOL 320 MG/ML IV SOLN
INTRAVENOUS | Status: DC | PRN
  Administered 2018-04-19: 50 mL via INTRAVENOUS

## 2018-04-19 MED ORDER — MIRTAZAPINE 15 MG PO TABS
15.0000 mg | ORAL_TABLET | Freq: Every day | ORAL | Status: DC
  Administered 2018-04-19 – 2018-04-24 (×6): 15 mg via ORAL
  Filled 2018-04-19 (×6): qty 1

## 2018-04-19 MED ORDER — HYDRALAZINE HCL 20 MG/ML IJ SOLN
5.0000 mg | INTRAMUSCULAR | Status: DC | PRN
  Filled 2018-04-19: qty 0.25

## 2018-04-19 MED ORDER — GABAPENTIN 400 MG PO CAPS
400.0000 mg | ORAL_CAPSULE | Freq: Three times a day (TID) | ORAL | Status: DC
  Administered 2018-04-19 – 2018-04-25 (×17): 400 mg via ORAL
  Filled 2018-04-19 (×17): qty 1

## 2018-04-19 MED ORDER — CEFAZOLIN SODIUM-DEXTROSE 1-4 GM-%(50ML) IV SOLR
1.0000 g | Freq: Three times a day (TID) | INTRAVENOUS | Status: AC
  Administered 2018-04-19 – 2018-04-20 (×2): 1 g via INTRAVENOUS
  Filled 2018-04-19 (×4): qty 50

## 2018-04-19 MED ORDER — OXYCODONE-ACETAMINOPHEN 5-325 MG PO TABS: 1.0000 | ORAL_TABLET | ORAL | Status: DC | PRN

## 2018-04-19 MED ORDER — ROCURONIUM BROMIDE 50 MG/5ML IV SOSY
PREFILLED_SYRINGE | INTRAVENOUS | Status: DC | PRN
  Administered 2018-04-19: 50 mg via INTRAVENOUS
  Administered 2018-04-19: 20 mg via INTRAVENOUS

## 2018-04-19 MED ORDER — LACTATED RINGERS IV SOLN
INTRAVENOUS | Status: DC | PRN
  Administered 2018-04-19 (×2): via INTRAVENOUS

## 2018-04-19 MED ORDER — DEXTROSE 5 % IV SOLN
INTRAVENOUS | Status: DC | PRN
  Administered 2018-04-19: 20 ug/min via INTRAVENOUS

## 2018-04-19 MED ORDER — MORPHINE SULFATE (PF) 2 MG/ML IV SOLN: 2.0000 mg | INTRAVENOUS | Status: DC | PRN

## 2018-04-19 MED ORDER — METOPROLOL TARTRATE 5 MG/5ML IV SOLN: 2.0000 mg | INTRAVENOUS | Status: DC | PRN

## 2018-04-19 SURGICAL SUPPLY — 63 items
BAG BANDED W/RUBBER/TAPE 36X54 (MISCELLANEOUS) ×3 IMPLANT
BAG SNAP BAND KOVER 36X36 (MISCELLANEOUS) ×3 IMPLANT
BLADE CLIPPER SURG (BLADE) ×3 IMPLANT
CANISTER SUCT 3000ML PPV (MISCELLANEOUS) ×3 IMPLANT
CANNULA VESSEL 3MM 2 BLNT TIP (CANNULA) ×3 IMPLANT
CATH EMB 4FR 80CM (CATHETERS) ×3 IMPLANT
CATH EMB 5FR 80CM (CATHETERS) ×6 IMPLANT
CATH OMNI FLUSH 5F 65CM (CATHETERS) ×3 IMPLANT
CLIP VESOCCLUDE MED 24/CT (CLIP) IMPLANT
CLIP VESOCCLUDE SM WIDE 24/CT (CLIP) IMPLANT
COVER DOME SNAP 22 D (MISCELLANEOUS) ×3 IMPLANT
COVER MAYO STAND STRL (DRAPES) ×3 IMPLANT
DERMABOND ADVANCED (GAUZE/BANDAGES/DRESSINGS) ×1
DERMABOND ADVANCED .7 DNX12 (GAUZE/BANDAGES/DRESSINGS) ×2 IMPLANT
ELECT BLADE 4.0 EZ CLEAN MEGAD (MISCELLANEOUS) ×3
ELECT BLADE 6.5 EXT (BLADE) ×3 IMPLANT
ELECT CAUTERY BLADE 6.4 (BLADE) ×3 IMPLANT
ELECT REM PT RETURN 9FT ADLT (ELECTROSURGICAL) ×3
ELECTRODE BLDE 4.0 EZ CLN MEGD (MISCELLANEOUS) ×2 IMPLANT
ELECTRODE REM PT RTRN 9FT ADLT (ELECTROSURGICAL) ×2 IMPLANT
GLOVE BIO SURGEON STRL SZ 6.5 (GLOVE) ×6 IMPLANT
GLOVE BIO SURGEON STRL SZ7.5 (GLOVE) ×6 IMPLANT
GLOVE SS BIOGEL STRL SZ 7.5 (GLOVE) ×4 IMPLANT
GLOVE SUPERSENSE BIOGEL SZ 7.5 (GLOVE) ×2
GOWN STRL REUS W/ TWL LRG LVL3 (GOWN DISPOSABLE) ×4 IMPLANT
GOWN STRL REUS W/ TWL XL LVL3 (GOWN DISPOSABLE) ×2 IMPLANT
GOWN STRL REUS W/TWL LRG LVL3 (GOWN DISPOSABLE) ×2
GOWN STRL REUS W/TWL XL LVL3 (GOWN DISPOSABLE) ×1
INSERT FOGARTY 61MM (MISCELLANEOUS) IMPLANT
INSERT FOGARTY SM (MISCELLANEOUS) IMPLANT
KIT BASIN OR (CUSTOM PROCEDURE TRAY) ×3 IMPLANT
KIT TURNOVER KIT B (KITS) ×3 IMPLANT
NEEDLE PERC 18GX7CM (NEEDLE) ×3 IMPLANT
NS IRRIG 1000ML POUR BTL (IV SOLUTION) ×6 IMPLANT
PACK AORTA (CUSTOM PROCEDURE TRAY) ×3 IMPLANT
PAD ARMBOARD 7.5X6 YLW CONV (MISCELLANEOUS) ×6 IMPLANT
PENCIL BUTTON HOLSTER BLD 10FT (ELECTRODE) ×3 IMPLANT
RETAINER VISCERA MED (MISCELLANEOUS) IMPLANT
SHEATH PINNACLE 8F 10CM (SHEATH) ×3 IMPLANT
STOPCOCK 3 WAY HIGH PRESSURE (MISCELLANEOUS) ×1
STOPCOCK 3WAY HIGH PRESSURE (MISCELLANEOUS) ×2 IMPLANT
SUT ETHIBOND 5 LR DA (SUTURE) IMPLANT
SUT MNCRL AB 4-0 PS2 18 (SUTURE) IMPLANT
SUT PDS AB 1 TP1 54 (SUTURE) IMPLANT
SUT PROLENE 3 0 SH 48 (SUTURE) IMPLANT
SUT PROLENE 5 0 C 1 24 (SUTURE) ×6 IMPLANT
SUT PROLENE 5 0 C 1 36 (SUTURE) IMPLANT
SUT PROLENE 6 0 CC (SUTURE) ×6 IMPLANT
SUT SILK 2 0SH CR/8 30 (SUTURE) IMPLANT
SUT VIC AB 2-0 CT1 27 (SUTURE) ×2
SUT VIC AB 2-0 CT1 TAPERPNT 27 (SUTURE) ×4 IMPLANT
SUT VIC AB 3-0 SH 27 (SUTURE) ×2
SUT VIC AB 3-0 SH 27X BRD (SUTURE) ×4 IMPLANT
SUT VIC AB 3-0 X1 27 (SUTURE) ×3 IMPLANT
SYR 10ML LL (SYRINGE) ×3 IMPLANT
SYR MEDRAD MARK V 150ML (SYRINGE) ×3 IMPLANT
TOWEL BLUE STERILE X RAY DET (MISCELLANEOUS) ×3 IMPLANT
TOWEL GREEN STERILE (TOWEL DISPOSABLE) ×3 IMPLANT
TRAY FOLEY MTR SLVR 16FR STAT (SET/KITS/TRAYS/PACK) ×3 IMPLANT
TUBE CONNECTING 20X1/4 (TUBING) ×3 IMPLANT
TUBING HIGH PRESSURE 120CM (CONNECTOR) ×3 IMPLANT
WATER STERILE IRR 1000ML POUR (IV SOLUTION) ×6 IMPLANT
WIRE BENTSON .035X145CM (WIRE) ×6 IMPLANT

## 2018-04-19 NOTE — Op Note (Signed)
    OPERATIVE REPORT  DATE OF SURGERY: 04/19/2018  PATIENT: Alison Lynch, 57 y.o. female MRN: [id number]  DOB: 26-Dec-1960  PRE-OPERATIVE DIAGNOSIS: Critical limb ischemia with occlusion of aortobifemoral bypass  POST-OPERATIVE DIAGNOSIS:  Same  PROCEDURE: Bilateral femoral exploration with bilateral femoral thrombectomy and intraoperative arteriogram  SURGEON:  Gretta Began, M.D.  Co-surgeon Dr. Durene Cal who will dictate left groin procedure  ANESTHESIA: General  EBL: 100 ml  No intake/output data recorded.  BLOOD ADMINISTERED: None  DRAINS: None  SPECIMEN: None  COUNTS CORRECT:  YES  PLAN OF CARE: PACU  PATIENT DISPOSITION:  PACU - hemodynamically stable  PROCEDURE DETAILS: The patient presented to the emergency room this morning after being transferred from [facility].  Had a history of aortobifemoral bypass grafting by Dr. Randie Heinz in July 2018.  She had several days of discomfort and this became unbearable when she presented to the emergency room.  She clearly had an occluded graft and underwent CT scan at [facility] showing occlusion of her aortobifemoral bypass with occlusion just below the level renal arteries.  There was some suggestion of partial thrombus in the Spear mesenteric artery takeoff and also some possible renal infarcts.  There was reconstitution at the groin.  There was some clot in the right superficial femoral artery.  Patient has a left above-knee amputation  Patient was placed in supine position.  The right groin incision was opened with an a 10 blade through the old scar and carried down to isolate the right limb of the graft and the native common femoral artery and superficial femoral and profundus femoris arteries.  These were all encircled with vessel loop.  The patient had been on a heparin drip and was given additional 5000 unit bolus.  The hood of the graft was opened longitudinally and a 4 Fogarty catheter was passed to the ankle and there was  clot at the origin but no further clot removed.  After a negative past the superficial femoral artery was flushed with heparinized saline and reoccluded.  Fogarty catheter was then passed down the profundus femoris artery with some clot again at the origin and excellent backbleeding and this was reoccluded.  The native common femoral artery was thrombectomized with the catheter only going approximately 10 cm and no thrombus was removed.  Finally the right limb of the graft was thrombectomized with excellent inflow.  After several negative passes the limb of the graft was reoccluded.  A similar procedure will be dictated as a separate note on the left groin by Dr. Myra Gianotti.  To completion of the thrombectomy his arteriogram was obtained which will also be dictated by Dr. Myra Gianotti.  The incision and the graft was closed with a running 5-0 Prolene suture in the usual flushing maneuvers were undertaken prior to completion of this anastomosis.  The anastomosis completed and excellent flow was noted with Doppler in the superior superficial femoral and profundus femoris arteries.  The patient did not have a reversal of her heparin.  The wound was irrigated with saline and hemostasis with cautery.  Wounds closed with 2-0 Vicryl in several layers in the subcutaneous tissue and the skin was closed with a 3-0 subcuticular Vicryl stitch.  Sterile dressing was applied and the patient was transferred to the recovery room in stable condition   Larina Earthly, M.D., [facility] 04/19/2018 7:04 PM

## 2018-04-19 NOTE — ED Triage Notes (Signed)
Pt arrives to ED from [facility] with complaints of right leg pain since last night. EMS reports pt had left AKA amputation for arterial occlusion last year and had stents placed on right side for prevention but was transferred today because there is an occlusion now on the right side. Pt placed in position of comfort with bed locked and lowered, call bell in reach.

## 2018-04-19 NOTE — Progress Notes (Signed)
ANTICOAGULATION CONSULT NOTE - Initial Consult  Pharmacy Consult for heparin Indication: arterial occlusion  Allergies  Allergen Reactions  . Clindamycin/Lincomycin Rash    Patient Measurements: Height: 5' (152.4 cm) Weight: 110 lb (49.9 kg) IBW/kg (Calculated) : 45.5 Heparin Dosing Weight: 49.9kg  Vital Signs: Temp: 98.4 F (36.9 C) (05/22 0745) Temp Source: Oral (05/22 0745) BP: 110/52 (05/22 0800) Pulse Rate: 97 (05/22 0800)  Labs: No results for input(s): HGB, HCT, PLT, APTT, LABPROT, INR, HEPARINUNFRC, HEPRLOWMOCWT, CREATININE, CKTOTAL, CKMB, TROPONINI in the last 72 hours.  CrCl cannot be calculated (Patient's most recent lab result is older than the maximum 21 days allowed.).   Medical History: Past Medical History:  Diagnosis Date  . Cervical myelopathy (HCC)   . 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 Depression   . Dysphagia   . Hx of migraine headaches   . Hyperlipidemia   . Hypothyroid   . Leriche syndrome (HCC) 12/2016   Chronically occluded aorta  . Osteoporosis   . PAD (peripheral artery disease) (HCC)    Status post left AKA for extensive thromboembolism, right leg has in-line flow through popliteal artery only.  Bilateral iliac stents occluded  . Panic attacks   . PTSD (post-traumatic stress disorder)     Medications:  Infusions:  . heparin      Assessment: 49 yof presented to the ED from OSH with an arterial occlusion. Baseline CBC pending but was WNL at OSH. She is not on anticoagulation PTA but it appears a heparin drip was started prior to arrival at [facility]. Nothing was hanging upon arrival however.   Goal of Therapy:  Heparin level 0.3-0.7 units/ml Monitor platelets by anticoagulation protocol: Yes   Plan:  Heparin gtt 800 units/hr Check an 8 hr heparin level Daily heparin level and CBC  Melquisedec Journey, Drake Leach 04/19/2018,8:21 AM

## 2018-04-19 NOTE — ED Provider Notes (Signed)
Medical screening examination/treatment/procedure(s) were conducted as a shared visit with non-physician practitioner(s) and myself.  I personally evaluated the patient during the encounter.  None Patient is transferred from [facility] for vascular occlusion.  She has prior history of aorto bifemoral bypass graft.  Yesterday at some point she felt like her leg was getting painful, numb and tingly.  Her companion reports that it did have bluish appearance.  She reports she massaged it and that improved somewhat.  At this time, she has been treated with heparin bolus at from [facility].  She now reports that the foot and leg do not feel numb.  She endorses sensation when I touch and massage the foot.  Denies paresthesia.  There is no peripheral edema.  Skin condition is very good.  No palpable dorsalis pedis or posterior tibial pulse.  Consultations have been made by PA-C [place] to vascular surgery.  Will resume patient's heparin.  I agree with plan of management.   Arby Barrette, MD 04/19/18 [phone]

## 2018-04-19 NOTE — ED Provider Notes (Addendum)
[facility] EMERGENCY DEPARTMENT Provider Note   CSN: [id number] Arrival date & time: 04/19/18  0732     History   Chief Complaint Chief Complaint  Patient presents with  . Arterial Occlusion    HPI Alison Lynch is a 57 y.o. female with a history of prior limb ischemia - status post left above-knee amputation (Dr. Randie Heinz), PAD, CKD who presents from [facility] with findings of arterial occlusion.  Patient states that around 12 AM this morning she awoke with right leg pain that she describes as a burning sensation, numbness and tingling of her right leg.  She states this is similar to when she had an arterial occlusion last year and had stents placed on the right side for prevention. She was see at [facility] and having a large occlusion as listed below.  Patient was given a heparin bolus at [facility] and transferred.  She states that her numbness has improved and she is still able to feel the lower extremity. Patient is on ASA and Plavix.  No fever, chills, chest pain, shortness of breath, cough, abdominal pain, nausea or vomiting, or weakness of the extremities.  HPI  Past Medical History:  Diagnosis Date  . Cervical myelopathy (HCC)   . 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 Depression   . Dysphagia   . Hx of migraine headaches   . Hyperlipidemia   . Hypothyroid   . Leriche syndrome (HCC) 12/2016   Chronically occluded aorta  . Osteoporosis   . PAD (peripheral artery disease) (HCC)    Status post left AKA for extensive thromboembolism, right leg has in-line flow through popliteal artery only.  Bilateral iliac stents occluded  . Panic attacks   . PTSD (post-traumatic stress disorder)     Patient Active Problem List   Diagnosis Date Noted  . Former heavy tobacco smoker 07/03/2017  . Aortoiliac occlusive disease (HCC) 05/30/2017  .  Peripheral artery disease (HCC) 05/12/2017  . Pre-operative cardiovascular examination 05/12/2017  . Hyperlipidemia LDL goal <70 05/12/2017  . Above knee amputation of left lower extremity (HCC) 01/13/2017  . Benign essential HTN   . PTSD (post-traumatic stress disorder)   . AKI (acute kidney injury) (HCC)   . Stage 3 chronic kidney disease (HCC)   . Phantom limb syndrome with pain (HCC)   . Cervical myelopathy (HCC)   . Chronic pain syndrome   . Leukocytosis   . Thrombocytopenia (HCC)   . Ischemia of lower extremity 01/08/2017  . Ischemia of left lower extremity 01/08/2017    Past Surgical History:  Procedure Laterality Date  . ABDOMINAL AORTOGRAM W/LOWER EXTREMITY Right 04/27/2017   Procedure: Abdominal Aortogram w/Lower Extremity;  Surgeon: Maeola Harman, MD;  Location: [facility] INVASIVE CV LAB;  Service: Cardiovascular;  Laterality: Right: Bilateral iliac stents occluded. Normal aortic flow with patent hypogastric and renal arteries. Femoral arteries patent by ultrasound; right leg and showed below-knee runoff -> plan Aortobifem Bypass  . AMPUTATION Left 01/09/2017   Procedure: AMPUTATION ABOVE KNEE;  Surgeon: Maeola Harman, MD;  Location: [facility] OR;  Service: Vascular;  Laterality: Left;  . AORTA - BILATERAL FEMORAL ARTERY BYPASS GRAFT N/A 05/30/2017   Procedure: AORTA BIFEMORAL BYPASS GRAFT USING 14X7MMX40CM HEMASHIELD GOLD GRAFT;  Surgeon: Maeola Harman, MD;  Location: [facility] OR;  Service: Vascular;  Laterality: N/A;  . AORTOGRAM Left 01/08/2017   Procedure: Ultrasound Guided  Cannulation Left Common Femoral Artery; Aortagram;  Surgeon: Maeola Harman, MD;  Location: [facility] OR;  Service: Vascular;  Laterality: Left;  . ARTERY EXPLORATION Left 01/08/2017   Procedure: Left Common Femoral Artery Exploration;  Surgeon: Maeola Harman, MD;  Location: [facility] OR;  Service: Vascular;  Laterality: Left;  . BACK SURGERY    . EMBOLECTOMY Left 01/08/2017    Procedure: Left Lower Extremity Thromboembolectomy;  Surgeon: Maeola Harman, MD;  Location: [facility] OR;  Service: Vascular;  Laterality: Left;  . ENDARTERECTOMY POPLITEAL Left 01/08/2017   Procedure: Left Popliteal Endarterectomy;  Surgeon: Maeola Harman, MD;  Location: [facility] OR;  Service: Vascular;  Laterality: Left;  . INSERTION OF ILIAC STENT Bilateral 01/08/2017   Procedure: Bilateral Common Iliac Stent and Left Popliteal Artery Stent;  Surgeon: Maeola Harman, MD;  Location: [facility] OR;  Service: Vascular;  Laterality: Bilateral;  . LOWER EXTREMITY ANGIOGRAM Bilateral 01/08/2017   Procedure: Bilateral Lower Extremity Angiogram;  Surgeon: Maeola Harman, MD;  Location: [facility] OR;  Service: Vascular;  Laterality: Bilateral;  . PATCH ANGIOPLASTY  01/08/2017   Procedure: Patch Angioplasty Left Popliteal Artery ;  Surgeon: Maeola Harman, MD;  Location: [facility] OR;  Service: Vascular;;  . TRANSTHORACIC ECHOCARDIOGRAM  12/2016   Normal EF 60-65%.  Essentially normal echocardiogram.  No significant valvular lesions.     OB History   None      Home Medications    Prior to Admission medications   Medication Sig Start Date End Date Taking? Authorizing Provider  aspirin EC 81 MG tablet Take 81 mg by mouth daily.    [provider]  atorvastatin (LIPITOR) 80 MG tablet Take 1 tablet (80 mg total) by mouth daily. 01/21/17   Angiulli, Mcarthur Rossetti, PA-C  busPIRone (BUSPAR) 10 MG tablet Take 10-15 mg by mouth See admin instructions. Take 15 mg every morning and take 10 mg at bedtime    [provider]  clopidogrel (PLAVIX) 75 MG tablet Take 75 mg by mouth daily.    [provider]  ezetimibe (ZETIA) 10 MG tablet Take 1 tablet (10 mg total) by mouth daily. 12/14/17 03/14/18  Marykay Lex, MD  gabapentin (NEURONTIN) 400 MG capsule Take 1 capsule (400 mg total) by mouth 3 (three) times daily. 04/05/17   Kirsteins, Victorino Sparrow, MD  Levothyroxine Sodium 50  MCG CAPS Take 50 mcg by mouth daily before breakfast.    [provider]  lurasidone (LATUDA) 20 MG TABS tablet Take by mouth.    [provider]  mirtazapine (REMERON) 15 MG tablet Take 15 mg by mouth at bedtime.    [provider]  ONDANSETRON HCL PO Take 40 mg by mouth daily.    [provider]  sertraline (ZOLOFT) 50 MG tablet Take 1 tablet (50 mg total) by mouth daily. 01/21/17   Angiulli, Mcarthur Rossetti, PA-C  traMADol (ULTRAM) 50 MG tablet Take 1 tablet (50 mg total) by mouth daily. 04/05/17   Kirsteins, Victorino Sparrow, MD    Family History Family History  Problem Relation Age of Onset  . Cancer Maternal Grandmother   . Heart disease Maternal Grandfather        He died at 2. Unknown what heart disease    Social History Social History   Tobacco Use  . Smoking status: Current Every Day Smoker    Packs/day: 0.50    Years: 40.00    Pack years: 20.00  . Smokeless tobacco: Never Used  Substance Use Topics  .  Alcohol use: No  . Drug use: No     Allergies   Clindamycin/lincomycin   Review of Systems Review of Systems  All other systems reviewed and are negative.    Physical Exam Updated Vital Signs BP 113/66 (BP Location: Right Arm)   Pulse 99   Temp 98.4 F (36.9 C) (Oral)   Resp 20   Ht 5' (1.524 m)   Wt 49.9 kg (110 lb)   SpO2 100%   BMI 21.48 kg/m   Physical Exam  Constitutional: She appears well-developed and well-nourished.  HENT:  Head: Normocephalic and atraumatic.  Right Ear: External ear normal.  Left Ear: External ear normal.  Nose: Nose normal.  Mouth/Throat: Uvula is midline, oropharynx is clear and moist and mucous membranes are normal. No tonsillar exudate.  Eyes: Pupils are equal, round, and reactive to light. Right eye exhibits no discharge. Left eye exhibits no discharge. No scleral icterus.  Neck: Trachea normal. Neck supple. No spinous process tenderness present. No neck rigidity. Normal range of motion present.    Cardiovascular: Normal rate, regular rhythm and intact distal pulses.  No murmur heard. Left above the knee amputation. Unable to palpate pulses of the lower leg. Doppler pulses unable to be found for dorsalis pedis and posterior tib pulses on right. Popliteal pulse on right found with doppler and marked. Foot is warm with delayed refill to skin.   Pulmonary/Chest: Effort normal and breath sounds normal. She exhibits no tenderness.  Abdominal: Soft. Bowel sounds are normal. There is no tenderness. There is no rebound and no guarding.  Musculoskeletal: She exhibits no edema.  Lymphadenopathy:    She has no cervical adenopathy.  Neurological: She is alert.  Normal sensation and rom of the lower extremities.   Skin: Skin is warm and dry. No rash noted. She is not diaphoretic.  Psychiatric: She has a normal mood and affect.  Nursing note and vitals reviewed.   ED Treatments / Results  Labs (all labs ordered are listed, but only abnormal results are displayed) Labs Reviewed  BASIC METABOLIC PANEL - Abnormal; Notable for the following components:      Result Value   Potassium 3.2 (*)    Glucose, Bld 130 (*)    Creatinine, Ser 1.64 (*)    Calcium 8.6 (*)    GFR calc non Af Amer 34 (*)    GFR calc Af Amer 40 (*)    All other components within normal limits  CBC WITH DIFFERENTIAL/PLATELET - Abnormal; Notable for the following components:   WBC 14.7 (*)    Hemoglobin 11.6 (*)    HCT 35.9 (*)    Neutro Abs 13.8 (*)    Lymphs Abs 0.4 (*)    All other components within normal limits  APTT - Abnormal; Notable for the following components:   aPTT 97 (*)    All other components within normal limits  PROTIME-INR  HEPARIN LEVEL (UNFRACTIONATED)  TYPE AND SCREEN    EKG None  Radiology No results found.  Procedures Procedures (including critical care time)  CRITICAL CARE Performed by: Jacinto Halim   Total critical care time: 35 minutes  Critical care time was exclusive of  separately billable procedures and treating other patients.  Critical care was necessary to treat or prevent imminent or life-threatening deterioration.  Critical care was time spent personally by me on the following activities: development of treatment plan with patient and/or surrogate as well as nursing, discussions with consultants, evaluation of patient's response to  treatment, examination of patient, obtaining history from patient or surrogate, ordering and performing treatments and interventions, ordering and review of laboratory studies, ordering and review of radiographic studies, pulse oximetry and re-evaluation of patient's condition.  Medications Ordered in ED Medications  heparin ADULT infusion 100 units/mL (25000 units/23mL sodium chloride 0.45%) (800 Units/hr Intravenous New Bag/Given 04/19/18 0825)  morphine 4 MG/ML injection 4 mg (has no administration in time range)     Initial Impression / Assessment and Plan / ED Course  I have reviewed the triage vital signs and the nursing notes.  Pertinent labs & imaging results that were available during my care of the patient were reviewed by me and considered in my medical decision making (see chart for details).     57 y.o. female with a history of prior limb ischemia - status post left above-knee amputation (Dr. Randie Heinz) presenting with right lower leg pain and findings of arterial occlusion after being seen at [facility] earlier this morning. Patient right extremity is warm.  Nonpalpable dorsalis pedis and posterior tib pulses.  Unable to obtain dorsalis pedis or posterior tib pulses on Doppler.  I am able to obtain popliteal pulse on the right side with Doppler.  Patient with left above-knee amputation.  Exam as above.  CT scan results as below.  Labs ordered.  Heparin drip started (heparin bolus given at [facility]).  Dr. Randie Heinz and Dr. Arbie Cookey made aware of patients presence in the ED. They are to see the patient. Request made for  CT scan images to be transferred from [place].   Appreciate Dr. Randie Heinz for seeing the patient the emergency department and admitting the patient.  They plan to take the patient to the OR today.  Patient is n.p.o. currently.    Patient case seen and discussed with Dr. Donnald Garre who is in agreement with plan.   Final Clinical Impressions(s) / ED Diagnoses   Final diagnoses:  Ischemic leg  Elevated serum creatinine  Unilateral AKA, left [facility])    ED Discharge Orders    None       Jacinto Halim, PA-C 04/19/18 [address] PA-C 04/19/18 1137    Arby Barrette, MD 04/23/18 1736
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
## 2018-04-19 NOTE — Op Note (Signed)
Patient name: Alison Lynch MRN: [id number] DOB: 1961-05-13 Sex: female  04/19/2018 Pre-operative Diagnosis: Occluded aortobifemoral bypass graft Post-operative diagnosis:  Same Surgeon:  Durene Cal Co-surgeon: Todd Early Procedure:   #1: Bilateral redo common femoral artery exposure   #2: Bilateral thrombectomy of the aortobifemoral bypass graft and bilateral superficial femoral and profundofemoral artery   #3: Catheter in aorta x1   #4: Abdominal aortogram Anesthesia: General Blood Loss:  100cc Specimens: None  Findings: Chronic thrombus within the left limb of the bypass graft which was able to be removed.  Arteriogram did not show any residual thrombus within the superior mesenteric or renal arteries.  Indications: Patient presented with leg pain and was found to have an occluded aortobifemoral bypass graft.  She was scheduled for thrombectomy.  There did appear to be some thrombus into the right renal artery and superior mesenteric artery.  Procedure:  The patient was identified in the holding area and taken to [facility] OR ROOM 16  The patient was then placed supine on the table. general anesthesia was administered.  The patient was prepped and draped in the usual sterile fashion.  A time out was called and antibiotics were administered.  Dr. Arbie Cookey dissected out the right groin.  Please see his operative note for full details.  I reopened the longitudinal left femoral incision.  Cautery and sharp dissection were used to isolate the left limb of the aortobifemoral graft as well as individual isolation of the common femoral, profundofemoral, and superficial femoral artery.  Once adequate exposure was obtained, the patient was given an additional bolus of heparin.  I then used an 11 blade to make an arteriotomy on the anterior surface of the aortobifemoral graft.  There was chronic thrombus within the femoral artery.  The laminated component was removed with DeBakey forceps.  I then passed a #4  Fogarty catheter down the superficial femoral artery.  It abruptly stopped at approximately 20 cm which confirmed the suspicion that this was chronically occluded.  No additional attempts at thrombectomy were performed of the superficial femoral artery.  A #4 Fogarty catheter was then advanced on the profundofemoral artery and thrombus was evacuated.  2 additional passes were made until there was no further thrombus evacuated and there was good backbleeding from the profundofemoral artery.  This was then occluded with a vessel loop.  A #4 Fogarty catheter was then advanced up the left limb of the graft into the aorta.  At 20 cm, there was difficulty advancing the catheter.  I was ultimately able to get past this area of obstruction and perform thrombectomy of the left limb of the graft.  Chronic thrombus was evacuated.  A #5 Fogarty catheter was also used.  Multiple passes were performed, evacuating chronic thrombus as well as laminated thrombus.  Eventually, negative passes were able to be made and there was excellent inflow.  At this point, an 8 [place] sheath was advanced into the right limb of the aortic graft.  A Bentson wire was then advanced into the suprarenal aorta followed by an Omni Flush catheter which was positioned at the level of T12.  An abdominal aortogram in the AP and 90 degree lateral projection were performed.  This showed good opacification of bilateral renal arteries and the superior mesenteric artery.  No obvious thrombus was visualized.  There did appear to be some luminal irregularity at the origin of the left limb of the graft which was consistent with the previous findings of having difficulty getting  the catheter across this area.  I did not feel additional interventions were required as this was chronically occluded and now had good brisk inflow.  We then removed the wire and catheter followed by the 8 French sheath.  I then closed the graftotomy in the right groin with a running 5-0  Prolene.  Prior to completion the appropriate flushing maneuvers were performed.  There remained good backbleeding from the superficial femoral and profundofemoral artery on the right as well as excellent inflow.  Once the arteriotomies were closed, Doppler was used to evaluate signals in the superficial femoral profundofemoral artery which were brisk.  No protamine was given.  The wounds were irrigated.  Once hemostasis was satisfactory groin incision was closed with multiple layers of 2-0 and 3-0 Vicryl followed by Dermabond on the skin.  There were no immediate complications   Disposition: To PACU stable   V. Durene Cal, M.D. [facility] Office: [phone] Pager:  [phone]

## 2018-04-19 NOTE — Anesthesia Postprocedure Evaluation (Signed)
Anesthesia Post Note  Patient: Alison Lynch  Procedure(s) Performed: AORTIC EMBOLECTOMY AORTOGRAM (N/A ) ABDOMINAL AORTOGRAM (N/A Abdomen)     Patient location during evaluation: PACU Anesthesia Type: General Level of consciousness: awake and alert Pain management: pain level controlled Vital Signs Assessment: post-procedure vital signs reviewed and stable Respiratory status: spontaneous breathing, nonlabored ventilation, respiratory function stable and patient connected to nasal cannula oxygen Cardiovascular status: blood pressure returned to baseline and stable Postop Assessment: no apparent nausea or vomiting Anesthetic complications: no    Last Vitals:  Vitals:   04/19/18 2000 04/19/18 2015  BP: (!) 94/58 105/62  Pulse: 83 88  Resp: 19 19  Temp:    SpO2: 100% 100%    Last Pain:  Vitals:   04/19/18 1124  TempSrc:   PainSc: 8                  Paisleigh Maroney P Onie Hayashi

## 2018-04-19 NOTE — Transfer of Care (Signed)
Immediate Anesthesia Transfer of Care Note  Patient: Alison Lynch  Procedure(s) Performed: AORTIC EMBOLECTOMY AORTOGRAM (N/A ) ABDOMINAL AORTOGRAM (N/A Abdomen)  Patient Location: PACU  Anesthesia Type:General  Level of Consciousness: awake, alert , oriented and patient cooperative  Airway & Oxygen Therapy: Patient Spontanous Breathing and Patient connected to nasal cannula oxygen  Post-op Assessment: Report given to RN and Post -op Vital signs reviewed and stable  Post vital signs: Reviewed and stable  Last Vitals:  Vitals Value Taken Time  BP    Temp    Pulse 117 04/19/2018  6:25 PM  Resp 22 04/19/2018  6:25 PM  SpO2 100 % 04/19/2018  6:25 PM  Vitals shown include unvalidated device data.  Last Pain:  Vitals:   04/19/18 1124  TempSrc:   PainSc: 8          Complications: No apparent anesthesia complications

## 2018-04-19 NOTE — Anesthesia Procedure Notes (Signed)
Arterial Line Insertion Start/End5/22/2019 2:36 PM, 04/19/2018 2:40 PM Performed by: Izola Price., CRNA, CRNA  Patient location: Pre-op. Preanesthetic checklist: patient identified, IV checked, site marked, risks and benefits discussed, surgical consent, monitors and equipment checked, pre-op evaluation, timeout performed and anesthesia consent Lidocaine 1% used for infiltration Right, radial was placed Catheter size: 20 G Hand hygiene performed , maximum sterile barriers used  and Seldinger technique used Allen's test indicative of satisfactory collateral circulation Attempts: 1 Procedure performed without using ultrasound guided technique. Following insertion, dressing applied and Biopatch. Post procedure assessment: normal and unchanged  Patient tolerated the procedure well with no immediate complications.

## 2018-04-19 NOTE — Anesthesia Procedure Notes (Signed)
Procedure Name: Intubation Date/Time: 04/19/2018 3:59 PM Performed by: Pearson Grippe, CRNA Pre-anesthesia Checklist: Patient identified, Emergency Drugs available, Suction available and Patient being monitored Patient Re-evaluated:Patient Re-evaluated prior to induction Oxygen Delivery Method: Circle system utilized Preoxygenation: Pre-oxygenation with 100% oxygen Induction Type: IV induction Ventilation: Mask ventilation without difficulty Laryngoscope Size: Glidescope and 3 Grade View: Grade I Tube type: Oral Tube size: 7.0 mm Number of attempts: 1 Airway Equipment and Method: Stylet Placement Confirmation: ETT inserted through vocal cords under direct vision,  positive ETCO2 and breath sounds checked- equal and bilateral Secured at: 21 cm Tube secured with: Tape Dental Injury: Teeth and Oropharynx as per pre-operative assessment  Comments: Elective use of Glidescope as per previous anesthetic records

## 2018-04-19 NOTE — Anesthesia Procedure Notes (Signed)
Central Venous Catheter Insertion Performed by: Kipp Brood, MD, anesthesiologist Start/End5/22/2019 3:00 PM Patient location: Pre-op. Preanesthetic checklist: patient identified, IV checked, site marked, risks and benefits discussed, surgical consent, monitors and equipment checked, pre-op evaluation, timeout performed and anesthesia consent Lidocaine 1% used for infiltration and patient sedated Hand hygiene performed  and maximum sterile barriers used  Catheter size: 12 Fr Total catheter length 16. Central line was placed.Triple lumen Procedure performed using ultrasound guided technique. Ultrasound Notes:image(s) printed for medical record Attempts: 1 Following insertion, dressing applied and line sutured. Post procedure assessment: blood return through all ports  Patient tolerated the procedure well with no immediate complications.

## 2018-04-19 NOTE — Anesthesia Preprocedure Evaluation (Addendum)
Anesthesia Evaluation  Patient identified by MRN, date of birth, ID band Patient awake    Reviewed: Allergy & Precautions, NPO status , Patient's Chart, lab work & pertinent test results  Airway Mallampati: II  TM Distance: >3 FB Neck ROM: Limited    Dental  (+) Teeth Intact   Pulmonary Current Smoker,    breath sounds clear to auscultation       Cardiovascular  Rhythm:Regular Rate:Normal     Neuro/Psych    GI/Hepatic   Endo/Other    Renal/GU      Musculoskeletal   Abdominal   Peds  Hematology   Anesthesia Other Findings   Reproductive/Obstetrics                            Anesthesia Physical Anesthesia Plan  ASA: III  Anesthesia Plan: General   Post-op Pain Management:    Induction: Intravenous  PONV Risk Score and Plan: Ondansetron and Dexamethasone  Airway Management Planned: Oral ETT and Video Laryngoscope Planned  Additional Equipment: Arterial line, CVP and Ultrasound Guidance Line Placement  Intra-op Plan:   Post-operative Plan: Extubation in OR  Informed Consent: I have reviewed the patients History and Physical, chart, labs and discussed the procedure including the risks, benefits and alternatives for the proposed anesthesia with the patient or authorized representative who has indicated his/her understanding and acceptance.     Plan Discussed with: CRNA and Anesthesiologist  Anesthesia Plan Comments:        Anesthesia Quick Evaluation

## 2018-04-20 ENCOUNTER — Encounter (HOSPITAL_COMMUNITY): Payer: Self-pay | Admitting: Vascular Surgery

## 2018-04-20 LAB — BASIC METABOLIC PANEL
Anion gap: 7 (ref 5–15)
BUN: 15 mg/dL (ref 6–20)
CO2: 20 mmol/L — AB (ref 22–32)
Calcium: 7.1 mg/dL — ABNORMAL LOW (ref 8.9–10.3)
Chloride: 115 mmol/L — ABNORMAL HIGH (ref 101–111)
Creatinine, Ser: 1.97 mg/dL — ABNORMAL HIGH (ref 0.44–1.00)
GFR calc Af Amer: 32 mL/min — ABNORMAL LOW (ref >60)
GFR, EST NON AFRICAN AMERICAN: 27 mL/min — AB (ref >60)
GLUCOSE: 149 mg/dL — AB (ref 65–99)
POTASSIUM: 3.8 mmol/L (ref 3.5–5.1)
Sodium: 142 mmol/L (ref 135–145)

## 2018-04-20 LAB — CBC
HCT: 24.2 % — ABNORMAL LOW (ref 36.0–46.0)
Hemoglobin: 7.8 g/dL — ABNORMAL LOW (ref 12.0–15.0)
MCH: 29.3 pg (ref 26.0–34.0)
MCHC: 32.2 g/dL (ref 30.0–36.0)
MCV: 91 fL (ref 78.0–100.0)
Platelets: 222 10*3/uL (ref 150–400)
RBC: 2.66 MIL/uL — AB (ref 3.87–5.11)
RDW: 13.2 % (ref 11.5–15.5)
WBC: 10 10*3/uL (ref 4.0–10.5)

## 2018-04-20 LAB — MRSA PCR SCREENING: MRSA by PCR: NEGATIVE

## 2018-04-20 LAB — HEPARIN LEVEL (UNFRACTIONATED): Heparin Unfractionated: 0.43 IU/mL (ref 0.30–0.70)

## 2018-04-20 MED ORDER — HEPARIN (PORCINE) IN NACL 100-0.45 UNIT/ML-% IJ SOLN
900.0000 [IU]/h | INTRAMUSCULAR | Status: DC
  Administered 2018-04-20: 800 [IU]/h via INTRAVENOUS
  Administered 2018-04-21: 700 [IU]/h via INTRAVENOUS
  Filled 2018-04-20 (×3): qty 250

## 2018-04-20 NOTE — Progress Notes (Signed)
PA informed me that foley was to remain in place due to patient's increased creatinine and need to closely monitor renal progress.

## 2018-04-20 NOTE — Plan of Care (Signed)
  Problem: Education: Goal: Knowledge of General Education information will improve 04/20/2018 1015 by [place] Cellar, RN Outcome: Progressing 04/20/2018 1014 by [place] Cellar, RN Outcome: Progressing   Problem: Health Behavior/Discharge Planning: Goal: Ability to manage health-related needs will improve Outcome: Progressing   Problem: Clinical Measurements: Goal: Ability to maintain clinical measurements within normal limits will improve 04/20/2018 1015 by [place] Cellar, RN Outcome: Progressing 04/20/2018 1014 by [place] Cellar, RN Outcome: Progressing Goal: Will remain free from infection 04/20/2018 1015 by [place] Cellar, RN Outcome: Progressing 04/20/2018 1014 by [place] Cellar, RN Outcome: Progressing Goal: Respiratory complications will improve 04/20/2018 1015 by [place] Cellar, RN Outcome: Progressing 04/20/2018 1014 by [place] Cellar, RN Outcome: Progressing Goal: Cardiovascular complication will be avoided 04/20/2018 1015 by [place] Cellar, RN Outcome: Progressing 04/20/2018 1014 by [place] Cellar, RN Outcome: Progressing   Problem: Clinical Measurements: Goal: Will remain free from infection 04/20/2018 1015 by [place] Cellar, RN Outcome: Progressing 04/20/2018 1014 by [place] Cellar, RN Outcome: Progressing   Problem: Clinical Measurements: Goal: Respiratory complications will improve 04/20/2018 1015 by [place] Cellar, RN Outcome: Progressing 04/20/2018 1014 by [place] Cellar, RN Outcome: Progressing   Problem: Clinical Measurements: Goal: Cardiovascular complication will be avoided 04/20/2018 1015 by [place] Cellar, RN Outcome: Progressing 04/20/2018 1014 by [place] Cellar, RN Outcome: Progressing   Problem: Activity: Goal: Risk for activity intolerance will decrease 04/20/2018 1015 by [place] Cellar, RN Outcome: Progressing 04/20/2018 1014 by [place] Cellar, RN Outcome:  Progressing   Problem: Nutrition: Goal: Adequate nutrition will be maintained 04/20/2018 1015 by [place] Cellar, RN Outcome: Progressing Note:  Diet increased to regular diet 04/20/2018 04/20/2018 1014 by [place] Cellar, RN Outcome: Progressing   Problem: Coping: Goal: Level of anxiety will decrease 04/20/2018 1015 by [place] Cellar, RN Outcome: Progressing 04/20/2018 1014 by [place] Cellar, RN Outcome: Progressing   Problem: Pain Managment: Goal: General experience of comfort will improve Outcome: Progressing   Problem: Safety: Goal: Ability to remain free from injury will improve 04/20/2018 1015 by [place] Cellar, RN Outcome: Progressing 04/20/2018 1014 by [place] Cellar, RN Outcome: Progressing   Problem: Skin Integrity: Goal: Demonstration of wound healing without infection will improve 04/20/2018 1015 by  Cellar, RN Outcome: Progressing 04/20/2018 1014 by  Cellar, RN Outcome: Progressing

## 2018-04-20 NOTE — Progress Notes (Signed)
ANTICOAGULATION CONSULT NOTE   Pharmacy Consult for heparin Indication: arterial occlusion  Allergies  Allergen Reactions  . Clindamycin/Lincomycin Rash    Patient Measurements: Height: 5' (152.4 cm) Weight: 110 lb (49.9 kg) IBW/kg (Calculated) : 45.5 Heparin Dosing Weight: 49.9kg  Vital Signs: Temp: 97.3 F (36.3 C) (05/23 0400) Temp Source: Axillary (05/23 0400) BP: 81/48 (05/23 0700) Pulse Rate: 71 (05/23 0700)  Labs: Recent Labs    04/19/18 0802 04/20/18 0459  HGB 11.6* 7.8*  HCT 35.9* 24.2*  PLT 316 222  APTT 97*  --   LABPROT 14.7  --   INR 1.15  --   CREATININE 1.64* 1.97*    Estimated Creatinine Clearance: 23.2 mL/min (A) (by C-G formula based on SCr of 1.97 mg/dL (H)).   Medical History: Past Medical History:  Diagnosis Date  . Cervical myelopathy (HCC)   . 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 Depression   . Dysphagia   . Hx of migraine headaches   . Hyperlipidemia   . Hypothyroid   . Leriche syndrome (HCC) 12/2016   Chronically occluded aorta  . Osteoporosis   . PAD (peripheral artery disease) (HCC)    Status post left AKA for extensive thromboembolism, right leg has in-line flow through popliteal artery only.  Bilateral iliac stents occluded  . Panic attacks   . PTSD (post-traumatic stress disorder)     Medications:  Infusions:  . sodium chloride 75 mL/hr at 04/19/18 2200  .  ceFAZolin (ANCEF) IV Stopped (04/19/18 2334)  . heparin    . magnesium sulfate 1 - 4 g bolus IVPB      Assessment: 57 yof presented to the ED from OSH with an arterial occlusion. Hgb was 11.6, decreasing to 7.8 post-procedure, plt down slightly to 222 - will monitor. No anticoagulation PTA; appears a heparin drip was started prior to arrival at [facility]. Was on 800 units/hr prior to procedure - pharmacy consulted to resume heparin following bilateral  femoral exploration with bilateral femoral thrombectomy and intraoperative arteriogram on 5/22.   Goal of Therapy:  Heparin level 0.3-0.7 units/ml Monitor platelets by anticoagulation protocol: Yes   Plan:  Resume heparin infusion at 800 units/hr (no bolus) Check an 8 hr heparin level Daily heparin level and CBC Follow up plans for anticoagulation at discharge  Girard Cooter, PharmD Clinical Pharmacist  Pager: [phone] Phone: [phone] 04/20/2018,7:46 AM

## 2018-04-20 NOTE — Evaluation (Addendum)
Physical Therapy Evaluation Patient Details Name: Alison Lynch MRN: [id number] DOB: 01/08/1961 Today's Date: 04/20/2018   History of Present Illness  57 y.o. female admitted with c/o right leg pain that started 5/21. Now s/p  bilateral redo common  femoral artery exposure, thrombectomy of aortobifemoral bypass graft 5/22. PMH includes: PAD, PTSD, cervical myelopathy, back surgery, L AKA, Bilateral femoral artery bypass graft.     Clinical Impression  Patient is s/p above surgery resulting in functional limitations due to the deficits listed below (see PT Problem List). PTA, pt living with husband in 1 level home with entry level. Over last several months patient has ambulated less due to ill fitting prosthetic, able to ambulate short distances in household with RW and transfer to w/c with husbands help for longer distances. Upon eval pt presents with post op pain. Currently min guard level for transfers to chair. Rec OP therapy to progress safe ambulation and assist with new prosthetic fitting. Pt reports husband able bodied and willing to assist her with getting to appointments and around home after medically d/c from hospital. Patient will benefit from skilled PT to increase their independence and safety with mobility to allow discharge to the venue listed below.    Vitals: Rest :                77/47,     HR 81     SpO2 100% on 1.5L Seated EOB:    117/66   After transfers: 133/117 , HR 98,    SpO2 94% on RA.      Follow Up Recommendations Outpatient PT;Supervision for mobility/OOB    Equipment Recommendations  None recommended by PT    Recommendations for Other Services       Precautions / Restrictions Precautions Precautions: Fall Precaution Comments: hypotensive Restrictions Weight Bearing Restrictions: No      Mobility  Bed Mobility Overal bed mobility: Needs Assistance Bed Mobility: Supine to Sit     Supine to sit: Min assist     General bed mobility comments: Min A  to assist with scooting EOB and line mgt.  Transfers Overall transfer level: Needs assistance Equipment used: Rolling walker (2 wheeled) Transfers: Sit to/from [employer] Sit to Stand: Min assist;Min guard Stand pivot transfers: Min assist;Min guard       General transfer comment: min A initially, progresed to min guard patient moving well able to transfer herself to and from chair/bed  Ambulation/Gait             General Gait Details: defered 2/2 pain  Stairs            Wheelchair Mobility    Modified Rankin (Stroke Patients Only)       Balance Overall balance assessment: Needs assistance   Sitting balance-Leahy Scale: Good       Standing balance-Leahy Scale: Poor Standing balance comment: reliant on BUE support for standing balance                             Pertinent Vitals/Pain Pain Assessment: 0-10 Pain Score: 7  Pain Location: stomach  Pain Descriptors / Indicators: Discomfort Pain Intervention(s): Limited activity within patient's tolerance;Monitored during session    Home Living Family/patient expects to be discharged to:: Private residence Living Arrangements: Spouse/significant other Available Help at Discharge: Family;Available 24 hours/day Type of Home: House Home Access: Level entry     Home Layout: One level Home Equipment: Grab bars -  tub/shower;Cane - single point;Walker - 2 wheels;Wheelchair - manual;Shower seat      Prior Function Level of Independence: Needs assistance   Gait / Transfers Assistance Needed: prosthetic does not fit well, utilizing RW for short distances, husband helps transfer in W/C           Hand Dominance        Extremity/Trunk Assessment   Upper Extremity Assessment Upper Extremity Assessment: Defer to OT evaluation    Lower Extremity Assessment Lower Extremity Assessment: Overall WFL for tasks assessed(L AKA, strength 4/5 gross)       Communication    Communication: No difficulties  Cognition Arousal/Alertness: Awake/alert Behavior During Therapy: WFL for tasks assessed/performed Overall Cognitive Status: Within Functional Limits for tasks assessed                                        General Comments      Exercises General Exercises - Lower Extremity Ankle Circles/Pumps: 20 reps   Assessment/Plan    PT Assessment Patient needs continued PT services  PT Problem List Decreased strength;Decreased range of motion;Decreased activity tolerance;Decreased balance;Decreased mobility;Pain       PT Treatment Interventions DME instruction;Gait training;Stair training;Functional mobility training;Therapeutic activities;Therapeutic exercise;Balance training    PT Goals (Current goals can be found in the Care Plan section)  Acute Rehab PT Goals Patient Stated Goal: return home and go to OP therapy PT Goal Formulation: With patient Time For Goal Achievement: 04/27/18 Potential to Achieve Goals: Good    Frequency Min 3X/week   Barriers to discharge        Co-evaluation PT/OT/SLP Co-Evaluation/Treatment: Yes Reason for Co-Treatment: For patient/therapist safety;Complexity of the patient's impairments (multi-system involvement);To address functional/ADL transfers PT goals addressed during session: Mobility/safety with mobility;Balance;Proper use of DME;Strengthening/ROM OT goals addressed during session: ADL's and self-care;Proper use of Adaptive equipment and DME;Strengthening/ROM       AM-PAC PT "6 Clicks" Daily Activity  Outcome Measure Difficulty turning over in bed (including adjusting bedclothes, sheets and blankets)?: Unable Difficulty moving from lying on back to sitting on the side of the bed? : Unable Difficulty sitting down on and standing up from a chair with arms (e.g., wheelchair, bedside commode, etc,.)?: Unable Help needed moving to and from a bed to chair (including a wheelchair)?: A Little Help  needed walking in hospital room?: A Little Help needed climbing 3-5 steps with a railing? : A Lot 6 Click Score: 11    End of Session Equipment Utilized During Treatment: Gait belt Activity Tolerance: Patient tolerated treatment well Patient left: in chair;with call bell/phone within reach;with nursing/sitter in room Nurse Communication: Mobility status PT Visit Diagnosis: Unsteadiness on feet (R26.81);Pain;Difficulty in walking, not elsewhere classified (R26.2) Pain - part of body: (stomach)    Time: 1610-9604 PT Time Calculation (min) (ACUTE ONLY): 36 min   Charges:   PT Evaluation $PT Eval Moderate Complexity: 1 Mod     PT G Codes:        Etta Grandchild, PT, DPT Acute Rehab Services Pager: [phone]    Etta Grandchild 04/20/2018, 9:23 AM

## 2018-04-20 NOTE — Care Management Note (Signed)
Case Management Note Donn Pierini RN,BSN [facility] 1-22 Case Manager  [phone]  Patient Details  Name: Alison Lynch MRN: [id number] Date of Birth: 23-Oct-1961  Subjective/Objective:  Pt admitted s/p Bilateral femoral exploration with bilateral femoral thrombectomy and intraoperative arteriogram                  Action/Plan: PTA pt lived at home with spouse- has needed DME at home that includes walker, shower chair, wheelchair and cane. Per PT eval recommendation for outpt PT- pt will need script for this or referral to [facility] location per pt choice. CM to follow for transition of care needs.   Expected Discharge Date:                  Expected Discharge Plan:  Home/Self Care  In-House Referral:     Discharge planning Services  CM Consult  Post Acute Care Choice:    Choice offered to:     DME Arranged:    DME Agency:     HH Arranged:    HH Agency:     Status of Service:  In process, will continue to follow  If discussed at Long Length of Stay Meetings, dates discussed:    Discharge Disposition:   Additional Comments:  Darrold Span, RN 04/20/2018, 10:28 AM

## 2018-04-20 NOTE — Evaluation (Signed)
Occupational Therapy Evaluation Patient Details Name: Alison Lynch MRN: [id number] DOB: [birth year] Today's Date: 04/20/2018    History of Present Illness 57 y.o. female admitted with c/o right leg pain that started 5/21. Now s/p  bilateral redo common  femoral artery exposure, thrombectomy of aortobifemoral bypass graft 5/22. PMH includes: PAD, PTSD, cervical myelopathy, back surgery, L AKA, Bilateral femoral artery bypass graft.    Clinical Impression   Pt was using a w/c or walker for mobility as she waits for her prosthesis to be refit and modified independent in ADL. She was going to OPPT. Pt presents with mild weakness and instability. She was grateful to be OOB and to eat. Pt will likely progress well to d/c home with husband and no further OT. Will follow acutely.    Follow Up Recommendations  No OT follow up    Equipment Recommendations  None recommended by OT    Recommendations for Other Services       Precautions / Restrictions Precautions Precautions: Fall Precaution Comments: hypotensive Restrictions Weight Bearing Restrictions: No Other Position/Activity Restrictions: old L AKA      Mobility Bed Mobility Overal bed mobility: Needs Assistance Bed Mobility: Supine to Sit     Supine to sit: Min assist     General bed mobility comments: Min A to assist with scooting EOB and line mgt.  Transfers Overall transfer level: Needs assistance Equipment used: Rolling walker (2 wheeled) Transfers: Sit to/from [employer] Sit to Stand: Min assist;Min guard Stand pivot transfers: Min assist;Min guard       General transfer comment: min A initially, progresed to min guard patient moving well able to transfer herself to and from chair/bed    Balance Overall balance assessment: Needs assistance   Sitting balance-Leahy Scale: Good       Standing balance-Leahy Scale: Poor Standing balance comment: reliant on BUE support for standing balance                            ADL either performed or assessed with clinical judgement   ADL Overall ADL's : Needs assistance/impaired Eating/Feeding: Independent;Sitting   Grooming: Set up;Sitting   Upper Body Bathing: Set up;Sitting   Lower Body Bathing: Minimal assistance;Sit to/from stand   Upper Body Dressing : Set up;Sitting   Lower Body Dressing: Minimal assistance;Sit to/from stand Lower Body Dressing Details (indicate cue type and reason): can don sock without difficulty Toilet Transfer: Minimal assistance;Min guard;Stand-pivot;RW Toilet Transfer Details (indicate cue type and reason): simulated to chair                 Vision Patient Visual Report: No change from baseline       Perception     Praxis      Pertinent Vitals/Pain Pain Assessment: 0-10 Pain Score: 7  Pain Location: stomach  Pain Descriptors / Indicators: Discomfort Pain Intervention(s): Repositioned;Monitored during session;Patient requesting pain meds-RN notified     Hand Dominance Right   Extremity/Trunk Assessment Upper Extremity Assessment Upper Extremity Assessment: Overall WFL for tasks assessed   Lower Extremity Assessment Lower Extremity Assessment: Defer to PT evaluation   Cervical / Trunk Assessment Cervical / Trunk Assessment: Normal   Communication Communication Communication: No difficulties   Cognition Arousal/Alertness: Awake/alert Behavior During Therapy: WFL for tasks assessed/performed Overall Cognitive Status: Within Functional Limits for tasks assessed  General Comments       Exercises Exercises: General Lower Extremity General Exercises - Lower Extremity Ankle Circles/Pumps: 20 reps   Shoulder Instructions      Home Living Family/patient expects to be discharged to:: Private residence Living Arrangements: Spouse/significant other Available Help at Discharge: Family;Available 24 hours/day Type of  Home: House Home Access: Level entry     Home Layout: One level     Bathroom Shower/Tub: Producer, television/film/video: Standard Bathroom Accessibility: Yes How Accessible: Accessible via wheelchair;Accessible via walker Home Equipment: Grab bars - tub/shower;Cane - single point;Walker - 2 wheels;Wheelchair - manual;Shower seat          Prior Functioning/Environment Level of Independence: Needs assistance  Gait / Transfers Assistance Needed: prosthetic does not fit well, utilizing RW for short distances, husband helps transfer in W/C ADL's / Homemaking Assistance Needed: modified independent            OT Problem List: Impaired balance (sitting and/or standing);Decreased knowledge of use of DME or AE;Pain      OT Treatment/Interventions: Self-care/ADL training;DME and/or AE instruction    OT Goals(Current goals can be found in the care plan section) Acute Rehab OT Goals Patient Stated Goal: return home and go to OP therapy OT Goal Formulation: With patient Time For Goal Achievement: 05/04/18 Potential to Achieve Goals: Good ADL Goals Pt Will Perform Lower Body Bathing: with modified independence;sitting/lateral leans;sit to/from stand Pt Will Perform Lower Body Dressing: with modified independence;sit to/from stand;sitting/lateral leans Pt Will Transfer to Toilet: with modified independence;ambulating;regular height toilet Pt Will Perform Toileting - Clothing Manipulation and hygiene: with modified independence;sitting/lateral leans;sit to/from stand Pt Will Perform Tub/Shower Transfer: with supervision;ambulating;shower seat;rolling walker  OT Frequency: Min 2X/week   Barriers to D/C:            Co-evaluation PT/OT/SLP Co-Evaluation/Treatment: Yes Reason for Co-Treatment: For patient/therapist safety(lines) PT goals addressed during session: Mobility/safety with mobility;Balance;Proper use of DME;Strengthening/ROM OT goals addressed during session: ADL's and  self-care      AM-PAC PT "6 Clicks" Daily Activity     Outcome Measure Help from another person eating meals?: None Help from another person taking care of personal grooming?: A Little Help from another person toileting, which includes using toliet, bedpan, or urinal?: A Little Help from another person bathing (including washing, rinsing, drying)?: A Little Help from another person to put on and taking off regular upper body clothing?: None Help from another person to put on and taking off regular lower body clothing?: A Little 6 Click Score: 20   End of Session Nurse Communication: Mobility status;Patient requests pain meds  Activity Tolerance: Patient tolerated treatment well(VSS on RA) Patient left: with call bell/phone within reach;in chair  OT Visit Diagnosis: Unsteadiness on feet (R26.81);Other abnormalities of gait and mobility (R26.89);Pain                Time: 3244-0102 OT Time Calculation (min): 33 min Charges:  OT General Charges $OT Visit: 1 Visit OT Evaluation $OT Eval Moderate Complexity: 1 Mod G-Codes:     05/11/18 Martie Round, OTR/L Pager: [phone]  Iran Planas, [place] Bailiff 2018-05-11, 9:36 AM

## 2018-04-20 NOTE — Progress Notes (Signed)
ANTICOAGULATION CONSULT NOTE   Pharmacy Consult for heparin Indication: arterial occlusion  Allergies  Allergen Reactions  . Clindamycin/Lincomycin Rash    Patient Measurements: Height: 5' (152.4 cm) Weight: 110 lb (49.9 kg) IBW/kg (Calculated) : 45.5 Heparin Dosing Weight: 49.9kg  Vital Signs: Temp: 97.9 F (36.6 C) (05/23 1055) Temp Source: Oral (05/23 1055) BP: 110/59 (05/23 1349) Pulse Rate: 90 (05/23 1349)  Labs: Recent Labs    04/19/18 0802 04/20/18 0459 04/20/18 1549  HGB 11.6* 7.8*  --   HCT 35.9* 24.2*  --   PLT 316 222  --   APTT 97*  --   --   LABPROT 14.7  --   --   INR 1.15  --   --   HEPARINUNFRC  --   --  0.43  CREATININE 1.64* 1.97*  --     Estimated Creatinine Clearance: 23.2 mL/min (A) (by C-G formula based on SCr of 1.97 mg/dL (H)).   Medical History: Past Medical History:  Diagnosis Date  . Cervical myelopathy (HCC)   . CKD (chronic kidney disease), stage III (HCC)   . 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 Depression   . Dysphagia   . Hx of migraine headaches   . Hyperlipidemia   . Hypothyroid   . Leriche syndrome (HCC) 12/2016   Chronically occluded aorta  . Osteoporosis   . PAD (peripheral artery disease) (HCC)    Status post left AKA for extensive thromboembolism, right leg has in-line flow through popliteal artery only.  Bilateral iliac stents occluded  . Panic attacks   . PTSD (post-traumatic stress disorder)     Medications:  Infusions:  . sodium chloride 100 mL/hr (04/20/18 0825)  . heparin 800 Units/hr (04/20/18 0828)  . magnesium sulfate 1 - 4 g bolus IVPB      Assessment: 57 yof presented to the ED from OSH with an arterial occlusion. Pharmacy consulted to resume heparin following bilateral femoral exploration with bilateral femoral thrombectomy and intraoperative arteriogram on 5/22.   Heparin  level at goal this afternoon, no overt bleeding or complications noted.  Goal of Therapy:  Heparin level 0.3-0.7 units/ml Monitor platelets by anticoagulation protocol: Yes   Plan:  Continue heparin infusion at 800 units/hr. Daily heparin level and CBC Follow up plans for anticoagulation at discharge  Jenetta Downer, [facility] Clinical Pharmacist Pager [phone]  04/20/2018 5:08 PM

## 2018-04-20 NOTE — Progress Notes (Addendum)
  Progress Note    04/20/2018 8:20 AM 1 Day Post-Op  Subjective:  Says she feels better today - leg pain improved  Afebrile 70's-100's systolic 90's110's systolic 99% 2LO2NC  Vitals:   04/20/18 0700 04/20/18 0811  BP: (!) 81/48   Pulse: 71   Resp: 16   Temp:  (!) 97.4 F (36.3 C)  SpO2: 99%     Physical Exam: Cardiac:  regular Lungs:  Non labored Incisions:   Bilateral groins look fine without hematoma Extremities:  + doppler signals right DP/PT Abdomen:  Soft, mildly tender to palpation   CBC    Component Value Date/Time   WBC 10.0 04/20/2018 0459   RBC 2.66 (L) 04/20/2018 0459   HGB 7.8 (L) 04/20/2018 0459   HCT 24.2 (L) 04/20/2018 0459   PLT 222 04/20/2018 0459   MCV 91.0 04/20/2018 0459   MCH 29.3 04/20/2018 0459   MCHC 32.2 04/20/2018 0459   RDW 13.2 04/20/2018 0459   LYMPHSABS 0.4 (L) 04/19/2018 0802   MONOABS 0.4 04/19/2018 0802   EOSABS 0.0 04/19/2018 0802   BASOSABS 0.1 04/19/2018 0802    BMET    Component Value Date/Time   NA 142 04/20/2018 0459   NA 144 12/06/2017 1517   K 3.8 04/20/2018 0459   CL 115 (H) 04/20/2018 0459   CO2 20 (L) 04/20/2018 0459   GLUCOSE 149 (H) 04/20/2018 0459   BUN 15 04/20/2018 0459   BUN 17 12/06/2017 1517   CREATININE 1.97 (H) 04/20/2018 0459   CALCIUM 7.1 (L) 04/20/2018 0459   GFRNONAA 27 (L) 04/20/2018 0459   GFRAA 32 (L) 04/20/2018 0459    INR    Component Value Date/Time   INR 1.15 04/19/2018 0802     Intake/Output Summary (Last 24 hours) at 04/20/2018 0820 Last data filed at 04/20/2018 0700 Gross per 24 hour  Intake 3765 ml  Output 1755 ml  Net 2010 ml     Assessment:  57 y.o. female is s/p:  Bilateral femoral exploration with bilateral femoral thrombectomy and intraoperative arteriogram  1 Day Post-Op  Plan: -pt doing well this am with improvement of pre op pain.  She has +doppler signals right DP/PT -BP is soft & creatinine elevated from yesterday-IVF rate increased.  Will keep foley in  today to monitor UOP -heparin gtt ordered and will need to transition to po [facility] prior to discharge.   -incisions look fine -ABI RLE   Doreatha Massed, PA-C [facility] [phone] 04/20/2018 8:20 AM   I have independently interviewed and examined patient and agree with PA assessment and plan above.  Increased IV fluids.  Will heparinize patient transition to oral anticoagulation  Okay to transfer to floor today.  Brandon C. Randie Heinz, MD [facility] [place] Office: [phone] Pager: [phone]

## 2018-04-21 ENCOUNTER — Inpatient Hospital Stay (HOSPITAL_COMMUNITY): Payer: Medicare HMO

## 2018-04-21 DIAGNOSIS — Z9889 Other specified postprocedural states: Secondary | ICD-10-CM

## 2018-04-21 LAB — BASIC METABOLIC PANEL
ANION GAP: 8 (ref 5–15)
BUN: 15 mg/dL (ref 6–20)
CO2: 24 mmol/L (ref 22–32)
Calcium: 7.8 mg/dL — ABNORMAL LOW (ref 8.9–10.3)
Chloride: 116 mmol/L — ABNORMAL HIGH (ref 101–111)
Creatinine, Ser: 2.08 mg/dL — ABNORMAL HIGH (ref 0.44–1.00)
GFR calc Af Amer: 30 mL/min — ABNORMAL LOW (ref >60)
GFR calc non Af Amer: 26 mL/min — ABNORMAL LOW (ref >60)
GLUCOSE: 102 mg/dL — AB (ref 65–99)
Potassium: 3.4 mmol/L — ABNORMAL LOW (ref 3.5–5.1)
Sodium: 148 mmol/L — ABNORMAL HIGH (ref 135–145)

## 2018-04-21 LAB — HEPARIN LEVEL (UNFRACTIONATED)
HEPARIN UNFRACTIONATED: 0.77 [IU]/mL — AB (ref 0.30–0.70)
HEPARIN UNFRACTIONATED: 0.34 [IU]/mL (ref 0.30–0.70)
Heparin Unfractionated: 0.49 IU/mL (ref 0.30–0.70)

## 2018-04-21 LAB — CBC
HEMATOCRIT: 23.7 % — AB (ref 36.0–46.0)
HEMOGLOBIN: 7.4 g/dL — AB (ref 12.0–15.0)
MCH: 28.7 pg (ref 26.0–34.0)
MCHC: 31.2 g/dL (ref 30.0–36.0)
MCV: 91.9 fL (ref 78.0–100.0)
Platelets: 276 10*3/uL (ref 150–400)
RBC: 2.58 MIL/uL — AB (ref 3.87–5.11)
RDW: 13.2 % (ref 11.5–15.5)
WBC: 9.3 10*3/uL (ref 4.0–10.5)

## 2018-04-21 MED ORDER — OXYCODONE-ACETAMINOPHEN 5-325 MG PO TABS: 1.0000 | ORAL_TABLET | Freq: Four times a day (QID) | ORAL | 0 refills | Status: DC | PRN

## 2018-04-21 MED ORDER — WARFARIN - PHARMACIST DOSING INPATIENT
Freq: Every day | Status: DC
  Administered 2018-04-21 – 2018-04-24 (×4)

## 2018-04-21 MED ORDER — WARFARIN SODIUM 7.5 MG PO TABS
7.5000 mg | ORAL_TABLET | Freq: Once | ORAL | Status: AC
  Administered 2018-04-21: 7.5 mg via ORAL
  Filled 2018-04-21: qty 1

## 2018-04-21 NOTE — Progress Notes (Signed)
Pt urinated at 1300

## 2018-04-21 NOTE — Progress Notes (Signed)
VASCULAR LAB PRELIMINARY  ARTERIAL  ABI completed: Normal ABI at rest.     RIGHT    LEFT    PRESSURE WAVEFORM  PRESSURE WAVEFORM  BRACHIAL 125 triphasic BRACHIAL IV 112 triphasic  DP 112 biphasic DP    AT   AT    PT 126 triphasic PT    PER   PER    GREAT TOE 0.58 NA GREAT TOE  NA    RIGHT LEFT  ABI 1.0 amputee     Chauncy Lean, RVT 04/21/2018, 10:04 AM

## 2018-04-21 NOTE — Progress Notes (Signed)
ANTICOAGULATION CONSULT NOTE   Pharmacy Consult for heparin Indication: arterial occlusion  Allergies  Allergen Reactions  . Clindamycin/Lincomycin Rash    Patient Measurements: Height: 5' (152.4 cm) Weight: 110 lb (49.9 kg) IBW/kg (Calculated) : 45.5 Heparin Dosing Weight: 49.9kg  Vital Signs: Temp: 98.9 F (37.2 C) (05/23 1951) Temp Source: Oral (05/23 1951) BP: 119/62 (05/23 1951) Pulse Rate: 66 (05/23 1951)  Labs: Recent Labs    04/19/18 0802 04/20/18 0459 04/20/18 1549 04/21/18 0100  HGB 11.6* 7.8*  --   --   HCT 35.9* 24.2*  --   --   PLT 316 222  --   --   APTT 97*  --   --   --   LABPROT 14.7  --   --   --   INR 1.15  --   --   --   HEPARINUNFRC  --   --  0.43 0.49  CREATININE 1.64* 1.97*  --   --     Estimated Creatinine Clearance: 23.2 mL/min (A) (by C-G formula based on SCr of 1.97 mg/dL (H)).  Assessment: 57 yo female with femoral occlusion s/p thrombectomy for heparin  Goal of Therapy:  Heparin level 0.3-0.7 units/ml Monitor platelets by anticoagulation protocol: Yes   Plan:  Continue Heparin at current rate  Geannie Risen, PharmD, BCPS   04/21/2018 2:08 AM

## 2018-04-21 NOTE — Progress Notes (Signed)
Preliminary results by tech - Bilateral renal arteries are patent with no evidence of a significant stenosis. There is abnormal resistive index bilaterally which may indicate renal parenchymal disease. SMA is patent. The celiac artery was not visualized due to bowel gas.  Marilynne Halsted, BS, RDMS, RVT

## 2018-04-21 NOTE — Progress Notes (Signed)
Physical Therapy Treatment Patient Details Name: Alison Lynch MRN: [id number] DOB: 25-Apr-1961 Today's Date: 04/21/2018    History of Present Illness 57 y.o. female admitted with c/o right leg pain that started 5/21. Now s/p  bilateral redo common  femoral artery exposure, thrombectomy of aortobifemoral bypass graft 5/22. PMH includes: PAD, PTSD, cervical myelopathy, back surgery, L AKA, Bilateral femoral artery bypass graft.     PT Comments    Pt making good progress. Amb with walker. Pt very close to baseline. Pt hasn't been using her prosthesis at home due to poor fitting. Family reports they are working with a prosthetist to make changes to her prosthetic. Do not feel she needs PT at DC until she has her refitted prosthetic. Has husband at home who can assist as needed.   Follow Up Recommendations  Supervision for mobility/OOB;No PT follow up     Equipment Recommendations  None recommended by PT    Recommendations for Other Services       Precautions / Restrictions Precautions Precautions: Fall Precaution Comments: hypotensive Restrictions Weight Bearing Restrictions: No Other Position/Activity Restrictions: old L AKA    Mobility  Bed Mobility Overal bed mobility: Needs Assistance Bed Mobility: Supine to Sit     Supine to sit: Min guard        Transfers Overall transfer level: Needs assistance Equipment used: Rolling walker (2 wheeled) Transfers: Sit to/from [employer] Sit to Stand: Min guard Stand pivot transfers: Min guard       General transfer comment: Assist for safety  Ambulation/Gait Ambulation/Gait assistance: Min guard Ambulation Distance (Feet): 45 Feet Assistive device: Rolling walker (2 wheeled) Gait Pattern/deviations: Step-to pattern Gait velocity: decr Gait velocity interpretation: <1.31 ft/sec, indicative of household ambulator General Gait Details: Pt with hop to pattern. Assist for safety   Stairs              Wheelchair Mobility    Modified Rankin (Stroke Patients Only)       Balance Overall balance assessment: Needs assistance Sitting-balance support: No upper extremity supported;Feet supported Sitting balance-Leahy Scale: Good     Standing balance support: Bilateral upper extremity supported Standing balance-Leahy Scale: Poor Standing balance comment: walker and supervision for safety                            Cognition Arousal/Alertness: Awake/alert Behavior During Therapy: WFL for tasks assessed/performed Overall Cognitive Status: No family/caregiver present to determine baseline cognitive functioning Area of Impairment: Memory                     Memory: Decreased short-term memory         General Comments: Pt had been seen by 3 previous therapist in the past 24 hours but didn't not remember any of them working with her      Exercises      General Comments        Pertinent Vitals/Pain Pain Assessment: No/denies pain Pain Score: 0-No pain Pain Location: stomach  Pain Intervention(s): Monitored during session    Home Living                      Prior Function            PT Goals (current goals can now be found in the care plan section) Acute Rehab PT Goals PT Goal Formulation: With patient Time For Goal Achievement: 04/27/18 Potential to Achieve Goals: Good Progress  towards PT goals: Goals met and updated - see care plan    Frequency    Min 3X/week      PT Plan Discharge plan needs to be updated    Co-evaluation              AM-PAC PT "6 Clicks" Daily Activity  Outcome Measure  Difficulty turning over in bed (including adjusting bedclothes, sheets and blankets)?: A Little Difficulty moving from lying on back to sitting on the side of the bed? : A Little Difficulty sitting down on and standing up from a chair with arms (e.g., wheelchair, bedside commode, etc,.)?: A Little Help needed moving to and from  a bed to chair (including a wheelchair)?: A Little Help needed walking in hospital room?: A Little Help needed climbing 3-5 steps with a railing? : A Lot 6 Click Score: 17    End of Session Equipment Utilized During Treatment: Gait belt Activity Tolerance: Patient tolerated treatment well Patient left: in chair;with call bell/phone within reach Nurse Communication: Mobility status PT Visit Diagnosis: Unsteadiness on feet (R26.81);Difficulty in walking, not elsewhere classified (R26.2)     Time: 5681-2751 PT Time Calculation (min) (ACUTE ONLY): 23 min  Charges:  $Gait Training: 23-37 mins                    G Codes:       [facility] PT [place] 04/21/2018, 5:19 PM

## 2018-04-21 NOTE — Progress Notes (Signed)
Benefit check sent for Alison Lynch and Alison Lynch yielded:    Lia Hopping, RN        # 5. THIS PATIENT HAS MEDICAID [place]      EFF-DATE 09-29-2017      CO-PAY- $ 3.80 FOR EACH RX

## 2018-04-21 NOTE — Progress Notes (Signed)
ANTICOAGULATION CONSULT NOTE   Pharmacy Consult for heparin Indication: arterial occlusion  Allergies  Allergen Reactions  . Clindamycin/Lincomycin Rash   Patient Measurements: Height: 5' (152.4 cm) Weight: 110 lb (49.9 kg) IBW/kg (Calculated) : 45.5 Heparin Dosing Weight: 49.9kg  Vital Signs: Temp: 98.2 F (36.8 C) (05/24 0801) Temp Source: Oral (05/24 0801) BP: 121/78 (05/24 0801) Pulse Rate: 92 (05/24 0801)  Labs: Recent Labs    04/19/18 0802 04/20/18 0459 04/20/18 1549 04/21/18 0100 04/21/18 0500  HGB 11.6* 7.8*  --   --  7.4*  HCT 35.9* 24.2*  --   --  23.7*  PLT 316 222  --   --  276  APTT 97*  --   --   --   --   LABPROT 14.7  --   --   --   --   INR 1.15  --   --   --   --   HEPARINUNFRC  --   --  0.43 0.49 0.77*  CREATININE 1.64* 1.97*  --   --  2.08*   Estimated Creatinine Clearance: 22 mL/min (A) (by C-G formula based on SCr of 2.08 mg/dL (H)).  Medical History: Past Medical History:  Diagnosis Date  . Cervical myelopathy (HCC)   . CKD (chronic kidney disease), stage III (HCC)   . 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 Depression   . Dysphagia   . Hx of migraine headaches   . Hyperlipidemia   . Hypothyroid   . Leriche syndrome (HCC) 12/2016   Chronically occluded aorta  . Osteoporosis   . PAD (peripheral artery disease) (HCC)    Status post left AKA for extensive thromboembolism, right leg has in-line flow through popliteal artery only.  Bilateral iliac stents occluded  . Panic attacks   . PTSD (post-traumatic stress disorder)    Medications:  Infusions:  . sodium chloride 75 mL/hr at 04/21/18 0805  . heparin 800 Units/hr (04/20/18 0828)  . magnesium sulfate 1 - 4 g bolus IVPB      Assessment: 57 yof presented to the ED from OSH with an arterial occlusion. Pharmacy consulted to resume heparin following bilateral femoral  exploration with bilateral femoral thrombectomy and intraoperative arteriogram on 5/22.   Heparin level above goal this morning: 0.77, HgB down postop, but stable over night; RN reports bleeding from patients hand where IV was infusing; bleeding controlled at this time  Goal of Therapy:  Heparin level 0.3-0.7 units/ml Monitor platelets by anticoagulation protocol: Yes   Plan:  Reduce heparin gtt to 700 units/hr  Daily heparin level and CBC Follow up plans for anticoagulation at discharge  Ruben Im, PharmD Clinical Pharmacist 04/21/2018 10:20 AM

## 2018-04-21 NOTE — Progress Notes (Addendum)
ANTICOAGULATION CONSULT NOTE   Pharmacy Consult for heparin, coumadin  Indication: arterial occlusion  Allergies  Allergen Reactions  . Clindamycin/Lincomycin Rash   Patient Measurements: Height: 5' (152.4 cm) Weight: 110 lb (49.9 kg) IBW/kg (Calculated) : 45.5 Heparin Dosing Weight: 49.9kg  Vital Signs: Temp: 98.2 F (36.8 C) (05/24 1308) Temp Source: Oral (05/24 1308) BP: 102/67 (05/24 1308) Pulse Rate: 94 (05/24 1308)  Labs: Recent Labs    04/19/18 0802 04/20/18 0459 04/20/18 1549 04/21/18 0100 04/21/18 0500  HGB 11.6* 7.8*  --   --  7.4*  HCT 35.9* 24.2*  --   --  23.7*  PLT 316 222  --   --  276  APTT 97*  --   --   --   --   LABPROT 14.7  --   --   --   --   INR 1.15  --   --   --   --   HEPARINUNFRC  --   --  0.43 0.49 0.77*  CREATININE 1.64* 1.97*  --   --  2.08*   Estimated Creatinine Clearance: 22 mL/min (A) (by C-G formula based on SCr of 2.08 mg/dL (H)).  Medical History: Past Medical History:  Diagnosis Date  . Cervical myelopathy (HCC)   . CKD (chronic kidney disease), stage III (HCC)   . 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 Depression   . Dysphagia   . Hx of migraine headaches   . Hyperlipidemia   . Hypothyroid   . Leriche syndrome (HCC) 12/2016   Chronically occluded aorta  . Osteoporosis   . PAD (peripheral artery disease) (HCC)    Status post left AKA for extensive thromboembolism, right leg has in-line flow through popliteal artery only.  Bilateral iliac stents occluded  . Panic attacks   . PTSD (post-traumatic stress disorder)    Medications:  Infusions:  . sodium chloride 75 mL/hr at 04/21/18 0805  . heparin 700 Units/hr (04/21/18 1025)  . magnesium sulfate 1 - 4 g bolus IVPB      Assessment: 57 yof presented to the ED from OSH with an arterial occlusion. Pharmacy consulted to resume heparin following bilateral  femoral exploration with bilateral femoral thrombectomy and intraoperative arteriogram on 5/22.   She is on heparin and now to pharmacy consulted to change to apixaban.  Spoke with Doreatha Massed PA and due to rising SCr will change anticoagulation plans to coumadin -SCr= 2.08 (trend up; noted 1.3 in 1.2019) , CrCl ~ 20-25, INR= 1.15 on 5/22  Goal of Therapy:  Heparin level 0.3-0.7 units/ml Monitor platelets by anticoagulation protocol: Yes   Plan:  -Coumadin 7.5mg  po today -Daily PT/INR  Harland German, PharmD Clinical Pharmacist 04/21/2018 5:56 PM  Addendum -heparin level is at goal on 700 units/hr  Plan -No heparin changes needed -Daily heparin level and CBC  Harland German, PharmD Clinical Pharmacist 04/21/2018 8:57 PM

## 2018-04-21 NOTE — Progress Notes (Signed)
Occupational Therapy Treatment Patient Details Name: Alison Lynch MRN: [id number] DOB: 09/22/1961 Today's Date: 04/21/2018    History of present illness 57 y.o. female admitted with c/o right leg pain that started 5/21. Now s/p  bilateral redo common  femoral artery exposure, thrombectomy of aortobifemoral bypass graft 5/22. PMH includes: PAD, PTSD, cervical myelopathy, back surgery, L AKA, Bilateral femoral artery bypass graft.    OT comments  Pt making progress with functional goal. Pt refusing to use RW to ambulate to bathroom. Unsteady initailly, requiring assist to bathroom and with LB ADLs, however ambulated to sink from bathroom and back to bed with improved balance. OT will continue to follow acutely  Follow Up Recommendations  No OT follow up    Equipment Recommendations  None recommended by OT    Recommendations for Other Services      Precautions / Restrictions Precautions Precautions: Fall Precaution Comments: hypotensive Restrictions Weight Bearing Restrictions: No Other Position/Activity Restrictions: old L AKA       Mobility Bed Mobility Overal bed mobility: Needs Assistance Bed Mobility: Supine to Sit     Supine to sit: Min guard        Transfers Overall transfer level: Needs assistance Equipment used: Rolling walker (2 wheeled) Transfers: Sit to/from [employer] Sit to Stand: Min assist;Min guard Stand pivot transfers: Min assist;Min guard       General transfer comment: min A initially, progresed to min guard patient moving well able to transfer herself to recliner from [facility]    Balance Overall balance assessment: Needs assistance Sitting-balance support: Feet supported Sitting balance-Leahy Scale: Good     Standing balance support: Bilateral upper extremity supported;During functional activity;Single extremity supported Standing balance-Leahy Scale: Poor                             ADL either performed or assessed  with clinical judgement   ADL Overall ADL's : Needs assistance/impaired     Grooming: Set up;Sitting;Wash/dry hands       Lower Body Bathing: Minimal assistance;Sit to/from stand;Min guard;Sitting/lateral leans       Lower Body Dressing: Minimal assistance;Sit to/from stand;Min guard;Sitting/lateral leans   Toilet Transfer: Minimal assistance;Min guard;Stand-pivot;RW;BSC   Toileting- Clothing Manipulation and Hygiene: Min guard;Sitting/lateral lean       Functional mobility during ADLs: Rolling walker;Minimal assistance;Min guard       Vision Patient Visual Report: No change from baseline     Perception     Praxis      Cognition Arousal/Alertness: Awake/alert Behavior During Therapy: WFL for tasks assessed/performed Overall Cognitive Status: Within Functional Limits for tasks assessed                                          Exercises     Shoulder Instructions       General Comments      Pertinent Vitals/ Pain       Pain Assessment: No/denies pain Pain Score: 0-No pain Pain Location: stomach  Pain Intervention(s): Monitored during session  Home Living                                          Prior Functioning/Environment  Frequency  Min 2X/week        Progress Toward Goals  OT Goals(current goals can now be found in the care plan section)  Progress towards OT goals: Progressing toward goals     Plan Discharge plan remains appropriate    Co-evaluation                 AM-PAC PT "6 Clicks" Daily Activity     Outcome Measure   Help from another person eating meals?: None Help from another person taking care of personal grooming?: A Little Help from another person toileting, which includes using toliet, bedpan, or urinal?: A Little Help from another person bathing (including washing, rinsing, drying)?: A Little Help from another person to put on and taking off regular upper body  clothing?: None Help from another person to put on and taking off regular lower body clothing?: A Little 6 Click Score: 20    End of Session Equipment Utilized During Treatment: Gait belt;Rolling walker;Other (comment)(BSC)  OT Visit Diagnosis: Unsteadiness on feet (R26.81);Other abnormalities of gait and mobility (R26.89);Pain   Activity Tolerance Patient tolerated treatment well   Patient Left with call bell/phone within reach;in chair;with chair alarm set   Nurse Communication Other (comment)(initial check on pt)    Functional Assessment Tool Used: AM-PAC 6 Clicks Daily Activity   Time: 1610-9604 OT Time Calculation (min): 25 min  Charges: OT G-codes **NOT FOR INPATIENT CLASS** Functional Assessment Tool Used: AM-PAC 6 Clicks Daily Activity OT General Charges $OT Visit: 1 Visit OT Treatments $Self Care/Home Management : 8-22 mins $Therapeutic Activity: 8-22 mins     Galen Manila 04/21/2018, 2:33 PM

## 2018-04-21 NOTE — Progress Notes (Addendum)
  Progress Note    04/21/2018 7:44 AM 2 Days Post-Op  Subjective:  Feels better this morning.  Afebrile HR 80's-90's 100's-110's systolic 93% RA  Vitals:   04/20/18 1951 04/21/18 0438  BP: 119/62 (!) 104/59  Pulse: 66 60  Resp: 19 18  Temp: 98.9 F (37.2 C) 98.3 F (36.8 C)  SpO2: 96% 95%    Physical Exam: Cardiac:  regular Lungs:  Non labored Incisions:  Bilateral groins are clean and dry Extremities:  Easily palpable right DP pulse Abdomen:  Soft, NT/ND  CBC    Component Value Date/Time   WBC 9.3 04/21/2018 0500   RBC 2.58 (L) 04/21/2018 0500   HGB 7.4 (L) 04/21/2018 0500   HCT 23.7 (L) 04/21/2018 0500   PLT 276 04/21/2018 0500   MCV 91.9 04/21/2018 0500   MCH 28.7 04/21/2018 0500   MCHC 31.2 04/21/2018 0500   RDW 13.2 04/21/2018 0500   LYMPHSABS 0.4 (L) 04/19/2018 0802   MONOABS 0.4 04/19/2018 0802   EOSABS 0.0 04/19/2018 0802   BASOSABS 0.1 04/19/2018 0802    BMET    Component Value Date/Time   NA 148 (H) 04/21/2018 0500   NA 144 12/06/2017 1517   K 3.4 (L) 04/21/2018 0500   CL 116 (H) 04/21/2018 0500   CO2 24 04/21/2018 0500   GLUCOSE 102 (H) 04/21/2018 0500   BUN 15 04/21/2018 0500   BUN 17 12/06/2017 1517   CREATININE 2.08 (H) 04/21/2018 0500   CALCIUM 7.8 (L) 04/21/2018 0500   GFRNONAA 26 (L) 04/21/2018 0500   GFRAA 30 (L) 04/21/2018 0500    INR    Component Value Date/Time   INR 1.15 04/19/2018 0802     Intake/Output Summary (Last 24 hours) at 04/21/2018 0744 Last data filed at 04/21/2018 0441 Gross per 24 hour  Intake 636.25 ml  Output 3700 ml  Net -3063.75 ml     Assessment:  57 y.o. female is s/p:  Bilateral femoral exploration with bilateral femoral thrombectomy and intraoperative arteriogram  2 Days Post-Op  Plan: -pt doing well this am with palpable right DP -pt Creatinine up slightly but stable with good UOP.  Order for 100cc/hr but no IVF running this am.  Will change to 75cc/hr -renal artery duplex ordered -DVT  prophylaxis:  Heparin gtt.  Will need to transition to po.  Will ask case management what her insurance will cover.  -mobilize -dc foley as she is making good urine.     Doreatha Massed, PA-C [facility] [phone] 04/21/2018 7:44 AM    I have interviewed and examined patient with PA and agree with assessment and plan above.   Deon Ivey C. Randie Heinz, MD [facility] [place] Office: [phone] Pager: [phone]

## 2018-04-21 NOTE — Progress Notes (Signed)
Foley removed, pt due to void at 1645.

## 2018-04-22 LAB — URINALYSIS, ROUTINE W REFLEX MICROSCOPIC
BACTERIA UA: NONE SEEN
Bilirubin Urine: NEGATIVE
Glucose, UA: NEGATIVE mg/dL
Ketones, ur: NEGATIVE mg/dL
LEUKOCYTES UA: NEGATIVE
Nitrite: NEGATIVE
PH: 6 (ref 5.0–8.0)
PROTEIN: NEGATIVE mg/dL
SPECIFIC GRAVITY, URINE: 1.005 (ref 1.005–1.030)

## 2018-04-22 LAB — CBC
HCT: 21.5 % — ABNORMAL LOW (ref 36.0–46.0)
Hemoglobin: 6.7 g/dL — CL (ref 12.0–15.0)
MCH: 29 pg (ref 26.0–34.0)
MCHC: 31.2 g/dL (ref 30.0–36.0)
MCV: 93.1 fL (ref 78.0–100.0)
PLATELETS: 275 10*3/uL (ref 150–400)
RBC: 2.31 MIL/uL — ABNORMAL LOW (ref 3.87–5.11)
RDW: 13.4 % (ref 11.5–15.5)
WBC: 6.6 10*3/uL (ref 4.0–10.5)

## 2018-04-22 LAB — PROTIME-INR
INR: 1.43
Prothrombin Time: 17.3 seconds — ABNORMAL HIGH (ref 11.4–15.2)

## 2018-04-22 LAB — BASIC METABOLIC PANEL
ANION GAP: 8 (ref 5–15)
BUN: 13 mg/dL (ref 6–20)
CALCIUM: 7.9 mg/dL — AB (ref 8.9–10.3)
CO2: 24 mmol/L (ref 22–32)
Chloride: 112 mmol/L — ABNORMAL HIGH (ref 101–111)
Creatinine, Ser: 1.92 mg/dL — ABNORMAL HIGH (ref 0.44–1.00)
GFR calc Af Amer: 33 mL/min — ABNORMAL LOW (ref >60)
GFR, EST NON AFRICAN AMERICAN: 28 mL/min — AB (ref >60)
GLUCOSE: 95 mg/dL (ref 65–99)
Potassium: 3.6 mmol/L (ref 3.5–5.1)
SODIUM: 144 mmol/L (ref 135–145)

## 2018-04-22 LAB — HEPARIN LEVEL (UNFRACTIONATED)
HEPARIN UNFRACTIONATED: 0.21 [IU]/mL — AB (ref 0.30–0.70)
Heparin Unfractionated: 0.3 IU/mL (ref 0.30–0.70)
Heparin Unfractionated: 0.28 IU/mL — ABNORMAL LOW (ref 0.30–0.70)

## 2018-04-22 LAB — HEMOGLOBIN AND HEMATOCRIT, BLOOD
HCT: 36.1 % (ref 36.0–46.0)
Hemoglobin: 11.8 g/dL — ABNORMAL LOW (ref 12.0–15.0)

## 2018-04-22 LAB — PREPARE RBC (CROSSMATCH)

## 2018-04-22 MED ORDER — WARFARIN SODIUM 7.5 MG PO TABS
7.5000 mg | ORAL_TABLET | Freq: Once | ORAL | Status: AC
  Administered 2018-04-22: 7.5 mg via ORAL
  Filled 2018-04-22: qty 1

## 2018-04-22 MED ORDER — SODIUM CHLORIDE 0.9 % IV SOLN
Freq: Once | INTRAVENOUS | Status: AC
  Administered 2018-04-22: 07:00:00 via INTRAVENOUS

## 2018-04-22 NOTE — Plan of Care (Signed)
  Problem: Education: Goal: Knowledge of General Education information will improve Outcome: Progressing   Problem: Health Behavior/Discharge Planning: Goal: Ability to manage health-related needs will improve Outcome: Progressing   Problem: Clinical Measurements: Goal: Ability to maintain clinical measurements within normal limits will improve Outcome: Progressing   

## 2018-04-22 NOTE — Progress Notes (Signed)
ANTICOAGULATION CONSULT NOTE   Pharmacy Consult for heparin Indication: arterial occlusion  Allergies  Allergen Reactions  . Clindamycin/Lincomycin Rash    Patient Measurements: Height: 5' (152.4 cm) Weight: 110 lb (49.9 kg) IBW/kg (Calculated) : 45.5 Heparin Dosing Weight: 49.9kg  Vital Signs: Temp: 98.7 F (37.1 C) (05/25 0621) Temp Source: Oral (05/25 0621) BP: 105/56 (05/25 0621) Pulse Rate: 83 (05/25 0621)  Labs: Recent Labs    04/19/18 0802 04/20/18 0459  04/21/18 0500 04/21/18 1744 04/22/18 0415  HGB 11.6* 7.8*  --  7.4*  --  6.7*  HCT 35.9* 24.2*  --  23.7*  --  21.5*  PLT 316 222  --  276  --  275  APTT 97*  --   --   --   --   --   LABPROT 14.7  --   --   --   --  17.3*  INR 1.15  --   --   --   --  1.43  HEPARINUNFRC  --   --    < > 0.77* 0.34 0.21*  CREATININE 1.64* 1.97*  --  2.08*  --  1.92*   < > = values in this interval not displayed.    Estimated Creatinine Clearance: 23.8 mL/min (A) (by C-G formula based on SCr of 1.92 mg/dL (H)).  Assessment: 57 yo female with femoral occlusion s/p thrombectomy for heparin.  Noted pt's hemoglobin dropped further this morning and pRBCs ordered.  Goal of Therapy:  Heparin level 0.3-0.7 units/ml Monitor platelets by anticoagulation protocol: Yes   Plan:  Increase Heparin 800 units/hr Check heparin level in 8 hours.   Geannie Risen, PharmD, BCPS   04/22/2018 6:36 AM

## 2018-04-22 NOTE — Progress Notes (Signed)
CRITICAL VALUE ALERT  Critical Value:  HGB 6.7  Date & Time Notied:  0557 04/22/2018  Provider Notified: On Call Vascular  Orders Received/Actions taken:Waiting for call back

## 2018-04-22 NOTE — Progress Notes (Signed)
ANTICOAGULATION CONSULT NOTE   Pharmacy Consult for heparin, coumadin  Indication: arterial occlusion  Allergies  Allergen Reactions  . Clindamycin/Lincomycin Rash   Patient Measurements: Height: 5' (152.4 cm) Weight: 110 lb (49.9 kg) IBW/kg (Calculated) : 45.5 Heparin Dosing Weight: 49.9kg  Vital Signs: Temp: 98.7 F (37.1 C) (05/25 1408) Temp Source: Oral (05/25 1408) BP: 122/65 (05/25 1408) Pulse Rate: 77 (05/25 1408)  Labs: Recent Labs    04/20/18 0459  04/21/18 0500 04/21/18 1744 04/22/18 0415 04/22/18 1602  HGB 7.8*  --  7.4*  --  6.7* 11.8*  HCT 24.2*  --  23.7*  --  21.5* 36.1  PLT 222  --  276  --  275  --   LABPROT  --   --   --   --  17.3*  --   INR  --   --   --   --  1.43  --   HEPARINUNFRC  --    < > 0.77* 0.34 0.21* 0.30  CREATININE 1.97*  --  2.08*  --  1.92*  --    < > = values in this interval not displayed.   Estimated Creatinine Clearance: 23.8 mL/min (A) (by C-G formula based on SCr of 1.92 mg/dL (H)).  Medical History: Past Medical History:  Diagnosis Date  . Cervical myelopathy (HCC)   . CKD (chronic kidney disease), stage III (HCC)   . 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 Depression   . Dysphagia   . Hx of migraine headaches   . Hyperlipidemia   . Hypothyroid   . Leriche syndrome (HCC) 12/2016   Chronically occluded aorta  . Osteoporosis   . PAD (peripheral artery disease) (HCC)    Status post left AKA for extensive thromboembolism, right leg has in-line flow through popliteal artery only.  Bilateral iliac stents occluded  . Panic attacks   . PTSD (post-traumatic stress disorder)    Medications:  Infusions:  . sodium chloride 10 mL/hr at 04/22/18 0034  . heparin 800 Units/hr (04/22/18 0657)  . magnesium sulfate 1 - 4 g bolus IVPB      Assessment: 57 yof presented to the ED from OSH with an arterial  occlusion. Pharmacy consulted to resume heparin following bilateral femoral exploration with bilateral femoral thrombectomy and intraoperative arteriogram on 5/22. The patient is currently on a heparin bridge until INR therapeutic. Patient received 2 uPRBC this afternoon. No overt bleeding per RN.   HL 0.3   Goal of Therapy:  INR: 2-3 Heparin level 0.3-0.7 units/ml Monitor platelets by anticoagulation protocol: Yes   Plan:  -Continue heparin at current rate - Heparin level lab draw at 2330  Daishia Fetterly A. Jeanella Craze, PharmD, BCPS Clinical Pharmacist [place] Pager: [phone]  04/22/2018 5:31 PM

## 2018-04-22 NOTE — Progress Notes (Signed)
Pharmacist okayed to continue heparin with blood transfusion.

## 2018-04-22 NOTE — Progress Notes (Addendum)
   Daily Progress Note   Assessment/Planning:   POD #3 s/p TE ABF, ARF   No clinical evidence of bleeding: pt suggest maybe some blood in urine  UA and guaiac ordered  2 u pRBC transfusing.  Check H/H afterwards  OOB  Coumadin loading  Pt will probably be here until Tues/Wed  Renal function appears to be stabilizing (2.5 L UOP yesterday)   Subjective  - 3 Days Post-Op   No events overnight, low H/H leading to transfusion, pt notes some blood in urine   Objective   Vitals:   04/22/18 0525 04/22/18 0621 04/22/18 0630 04/22/18 0706  BP: 112/61 (!) 105/56 113/66 (!) 112/58  Pulse: 74 83  81  Resp: 18  18 (!) 21  Temp: 98.8 F (37.1 C) 98.7 F (37.1 C)  98.7 F (37.1 C)  TempSrc: Oral Oral  Oral  SpO2: 91% 94%  94%  Weight:      Height:         Intake/Output Summary (Last 24 hours) at 04/22/2018 0746 Last data filed at 04/22/2018 0630 Gross per 24 hour  Intake 323 ml  Output 2450 ml  Net -2127 ml    PULM  CTAB  CV  RRR  GI  soft, NTND  VASC Both groins c/d/i, no obvious hematoma, warm R foot, faint AT pulse  NEURO R foot motor and sensation intact    Laboratory   CBC CBC Latest Ref Rng & Units 04/22/2018 04/21/2018 04/20/2018  WBC 4.0 - 10.5 K/uL 6.6 9.3 10.0  Hemoglobin 12.0 - 15.0 g/dL 6.7(LL) 7.4(L) 7.8(L)  Hematocrit 36.0 - 46.0 % 21.5(L) 23.7(L) 24.2(L)  Platelets 150 - 400 K/uL 275 276 222    BMET    Component Value Date/Time   NA 144 04/22/2018 0415   NA 144 12/06/2017 1517   K 3.6 04/22/2018 0415   CL 112 (H) 04/22/2018 0415   CO2 24 04/22/2018 0415   GLUCOSE 95 04/22/2018 0415   BUN 13 04/22/2018 0415   BUN 17 12/06/2017 1517   CREATININE 1.92 (H) 04/22/2018 0415   CALCIUM 7.9 (L) 04/22/2018 0415   GFRNONAA 28 (L) 04/22/2018 0415   GFRAA 33 (L) 04/22/2018 0415     Leonides Sake, MD, FACS [facility] [place] Office: [phone] Pager: [phone]  04/22/2018, 7:46 AM

## 2018-04-22 NOTE — Progress Notes (Signed)
ANTICOAGULATION CONSULT NOTE   Pharmacy Consult for heparin, coumadin  Indication: arterial occlusion  Allergies  Allergen Reactions  . Clindamycin/Lincomycin Rash   Patient Measurements: Height: 5' (152.4 cm) Weight: 110 lb (49.9 kg) IBW/kg (Calculated) : 45.5 Heparin Dosing Weight: 49.9kg  Vital Signs: Temp: 98.7 F (37.1 C) (05/25 1408) Temp Source: Oral (05/25 1408) BP: 122/65 (05/25 1408) Pulse Rate: 77 (05/25 1408)  Labs: Recent Labs    04/20/18 0459  04/21/18 0500 04/21/18 1744 04/22/18 0415  HGB 7.8*  --  7.4*  --  6.7*  HCT 24.2*  --  23.7*  --  21.5*  PLT 222  --  276  --  275  LABPROT  --   --   --   --  17.3*  INR  --   --   --   --  1.43  HEPARINUNFRC  --    < > 0.77* 0.34 0.21*  CREATININE 1.97*  --  2.08*  --  1.92*   < > = values in this interval not displayed.   Estimated Creatinine Clearance: 23.8 mL/min (A) (by C-G formula based on SCr of 1.92 mg/dL (H)).  Medical History: Past Medical History:  Diagnosis Date  . Cervical myelopathy (HCC)   . CKD (chronic kidney disease), stage III (HCC)   . 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 Depression   . Dysphagia   . Hx of migraine headaches   . Hyperlipidemia   . Hypothyroid   . Leriche syndrome (HCC) 12/2016   Chronically occluded aorta  . Osteoporosis   . PAD (peripheral artery disease) (HCC)    Status post left AKA for extensive thromboembolism, right leg has in-line flow through popliteal artery only.  Bilateral iliac stents occluded  . Panic attacks   . PTSD (post-traumatic stress disorder)    Medications:  Infusions:  . sodium chloride 10 mL/hr at 04/22/18 0034  . heparin 800 Units/hr (04/22/18 0657)  . magnesium sulfate 1 - 4 g bolus IVPB      Assessment: 57 yof presented to the ED from OSH with an arterial occlusion. Pharmacy consulted to resume heparin following  bilateral femoral exploration with bilateral femoral thrombectomy and intraoperative arteriogram on 5/22. The patient is currently on a heparin bridge until INR therapeutic. Patient received 2 uPRBC this afternoon. No overt bleeding per RN.   INR: 1.43 after first dose   Goal of Therapy:  INR: 2-3 Heparin level 0.3-0.7 units/ml Monitor platelets by anticoagulation protocol: Yes   Plan:  -Coumadin 7.5mg  po today -Daily PT/INR -Continue heparin at current rate - Heparin level lab draw delayed 2/2 to blood transfusion, lab to collect heparin level at 1600   Ruben Im, PharmD Clinical Pharmacist 04/22/2018 4:01 PM

## 2018-04-22 NOTE — Progress Notes (Signed)
Telephone order received to transfuse 2 units of Blood.

## 2018-04-23 LAB — CBC
HEMATOCRIT: 35.5 % — AB (ref 36.0–46.0)
Hemoglobin: 11.9 g/dL — ABNORMAL LOW (ref 12.0–15.0)
MCH: 29.2 pg (ref 26.0–34.0)
MCHC: 33.5 g/dL (ref 30.0–36.0)
MCV: 87.2 fL (ref 78.0–100.0)
Platelets: 303 10*3/uL (ref 150–400)
RBC: 4.07 MIL/uL (ref 3.87–5.11)
RDW: 14.7 % (ref 11.5–15.5)
WBC: 8.8 10*3/uL (ref 4.0–10.5)

## 2018-04-23 LAB — TYPE AND SCREEN
ABO/RH(D): A POS
Antibody Screen: NEGATIVE
Unit division: 0
Unit division: 0

## 2018-04-23 LAB — PROTIME-INR
INR: 5.21 — AB
INR: 5.5 — AB
Prothrombin Time: 47.6 seconds — ABNORMAL HIGH (ref 11.4–15.2)
Prothrombin Time: 49.6 seconds — ABNORMAL HIGH (ref 11.4–15.2)

## 2018-04-23 LAB — BPAM RBC
Blood Product Expiration Date: 201906062359
Blood Product Expiration Date: 201906062359
ISSUE DATE / TIME: 201905250632
ISSUE DATE / TIME: 201905251033
Unit Type and Rh: 6200
Unit Type and Rh: 6200

## 2018-04-23 LAB — BASIC METABOLIC PANEL
Anion gap: 8 (ref 5–15)
BUN: 13 mg/dL (ref 6–20)
CALCIUM: 8.6 mg/dL — AB (ref 8.9–10.3)
CO2: 27 mmol/L (ref 22–32)
Chloride: 105 mmol/L (ref 101–111)
Creatinine, Ser: 1.85 mg/dL — ABNORMAL HIGH (ref 0.44–1.00)
GFR, EST AFRICAN AMERICAN: 34 mL/min — AB (ref >60)
GFR, EST NON AFRICAN AMERICAN: 30 mL/min — AB (ref >60)
GLUCOSE: 104 mg/dL — AB (ref 65–99)
POTASSIUM: 3.8 mmol/L (ref 3.5–5.1)
SODIUM: 140 mmol/L (ref 135–145)

## 2018-04-23 LAB — HEPARIN LEVEL (UNFRACTIONATED)
HEPARIN UNFRACTIONATED: 0.36 [IU]/mL (ref 0.30–0.70)
Heparin Unfractionated: 0.31 IU/mL (ref 0.30–0.70)

## 2018-04-23 MED ORDER — PHYTONADIONE 1 MG/0.5 ML ORAL SOLUTION
1.0000 mg | Freq: Once | ORAL | Status: AC
  Administered 2018-04-23: 1 mg via ORAL
  Filled 2018-04-23: qty 0.5

## 2018-04-23 NOTE — Progress Notes (Signed)
CRITICAL VALUE ALERT  Critical Value: 5.50  Date & Time Notied:  04/23/2016    0750  Provider Notified: Dr. Imogene Burn    Orders Received/Actions taken: Awaiting Orders

## 2018-04-23 NOTE — Progress Notes (Signed)
   Daily Progress Note   Assessment/Planning:   POD #4 s/p ABF TE   INR supratherapeutic so will need to hold Coumadin  No evidence of clinical bleeding so no need for reversal currently  Heparin drip can be held as supratherapeutic  Stable H/H (11.9/35.5) after 2 units of pRBC given on Saturday, suggesting the 6.7/21.5 was likely false  Home once coumadin SAFELY loaded.  INR is too high to discharge pt.  Pt also can't go home until Coumadin mgmt coordinated, which would be difficult on a Memorial Day weekend.   Subjective  - 4 Days Post-Op   No complaints   Objective   Vitals:   04/22/18 2102 04/23/18 0037 04/23/18 0324 04/23/18 0815  BP: 139/68 111/65 111/70 135/88  Pulse: 72  66 75  Resp: Temp: 98.6 F (37 C) 98.9 F (37.2 C) 98.9 F (37.2 C) 98.8 F (37.1 C)  TempSrc: Oral Oral Oral Oral  SpO2: 97% 93% 90% 94%  Weight:      Height:         Intake/Output Summary (Last 24 hours) at 04/23/2018 1100 Last data filed at 04/23/2018 0500 Gross per 24 hour  Intake 705 ml  Output 1650 ml  Net -945 ml    PULM  CTAB  CV  RRR  GI  soft, NTND  VASC All incision c/d/i, small amount of blood on L groin dressing  NEURO DNVI    Laboratory   CBC CBC Latest Ref Rng & Units 04/23/2018 04/22/2018 04/22/2018  WBC 4.0 - 10.5 K/uL 8.8 - 6.6  Hemoglobin 12.0 - 15.0 g/dL 11.9(L) 11.8(L) 6.7(LL)  Hematocrit 36.0 - 46.0 % 35.5(L) 36.1 21.5(L)  Platelets 150 - 400 K/uL 303 - 275    BMET    Component Value Date/Time   NA 140 04/23/2018 0500   NA 144 12/06/2017 1517   K 3.8 04/23/2018 0500   CL 105 04/23/2018 0500   CO2 27 04/23/2018 0500   GLUCOSE 104 (H) 04/23/2018 0500   BUN 13 04/23/2018 0500   BUN 17 12/06/2017 1517   CREATININE 1.85 (H) 04/23/2018 0500   CALCIUM 8.6 (L) 04/23/2018 0500   GFRNONAA 30 (L) 04/23/2018 0500   GFRAA 34 (L) 04/23/2018 0500     Leonides Sake, MD, FACS [facility] [place] Office:  [phone] Pager: [phone]  04/23/2018, 11:00 AM

## 2018-04-23 NOTE — Progress Notes (Signed)
ANTICOAGULATION CONSULT NOTE   Pharmacy Consult for heparin Indication: arterial occlusion  Allergies  Allergen Reactions  . Clindamycin/Lincomycin Rash    Patient Measurements: Height: 5' (152.4 cm) Weight: 110 lb (49.9 kg) IBW/kg (Calculated) : 45.5 Heparin Dosing Weight: 49.9kg  Vital Signs: Temp: 98.9 F (37.2 C) (05/26 0037) Temp Source: Oral (05/26 0037) BP: 111/65 (05/26 0037) Pulse Rate: 72 (05/25 2102)  Labs: Recent Labs    04/20/18 0459  04/21/18 0500  04/22/18 0415 04/22/18 1602 04/22/18 2243  HGB 7.8*  --  7.4*  --  6.7* 11.8*  --   HCT 24.2*  --  23.7*  --  21.5* 36.1  --   PLT 222  --  276  --  275  --   --   LABPROT  --   --   --   --  17.3*  --   --   INR  --   --   --   --  1.43  --   --   HEPARINUNFRC  --    < > 0.77*   < > 0.21* 0.30 0.28*  CREATININE 1.97*  --  2.08*  --  1.92*  --   --    < > = values in this interval not displayed.    Estimated Creatinine Clearance: 23.8 mL/min (A) (by C-G formula based on SCr of 1.92 mg/dL (H)).  Assessment: 57 yo female with femoral occlusion s/p thrombectomy for heparin..  Goal of Therapy:  Heparin level 0.3-0.7 units/ml Monitor platelets by anticoagulation protocol: Yes   Plan:  Increase Heparin 900 units/hr  Geannie Risen, PharmD, BCPS   04/23/2018 1:23 AM

## 2018-04-23 NOTE — Progress Notes (Signed)
ANTICOAGULATION CONSULT NOTE   Pharmacy Consult for coumadin  Indication: arterial occlusion  Allergies  Allergen Reactions  . Clindamycin/Lincomycin Rash   Patient Measurements: Height: 5' (152.4 cm) Weight: 110 lb (49.9 kg) IBW/kg (Calculated) : 45.5 Heparin Dosing Weight: 49.9kg  Vital Signs: Temp: 98.8 F (37.1 C) (05/26 0815) Temp Source: Oral (05/26 0815) BP: 135/88 (05/26 0815) Pulse Rate: 75 (05/26 0815)  Labs: Recent Labs    04/21/18 0500  04/22/18 0415 04/22/18 1602 04/22/18 2243 04/23/18 0500 04/23/18 0653  HGB 7.4*  --  6.7* 11.8*  --  11.9*  --   HCT 23.7*  --  21.5* 36.1  --  35.5*  --   PLT 276  --  275  --   --  303  --   LABPROT  --   --  17.3*  --   --  47.6* 49.6*  INR  --   --  1.43  --   --  5.21* 5.50*  HEPARINUNFRC 0.77*   < > 0.21* 0.30 0.28* 0.31 0.36  CREATININE 2.08*  --  1.92*  --   --  1.85*  --    < > = values in this interval not displayed.   Estimated Creatinine Clearance: 24.7 mL/min (A) (by C-G formula based on SCr of 1.85 mg/dL (H)).  Medical History: Past Medical History:  Diagnosis Date  . Cervical myelopathy (HCC)   . CKD (chronic kidney disease), stage III (HCC)   . 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 Depression   . Dysphagia   . Hx of migraine headaches   . Hyperlipidemia   . Hypothyroid   . Leriche syndrome (HCC) 12/2016   Chronically occluded aorta  . Osteoporosis   . PAD (peripheral artery disease) (HCC)    Status post left AKA for extensive thromboembolism, right leg has in-line flow through popliteal artery only.  Bilateral iliac stents occluded  . Panic attacks   . PTSD (post-traumatic stress disorder)    Medications:  Infusions:  . sodium chloride 10 mL/hr at 04/22/18 0034  . magnesium sulfate 1 - 4 g bolus IVPB     Assessment: 57 yof presented to the ED from OSH with an arterial  occlusion. Pharmacy consulted to resume heparin following bilateral femoral exploration with bilateral femoral thrombectomy and intraoperative arteriogram on 5/22. Patient received two doses of warfarin and the INR jumped 1.4>5.5. Heparin infusion has been stopped and warfarin will be held while INR is supratherapeutic. No overt bruising or bleeding that would require immediate reversal. Spoke with provider and he decided to give low dose vitamin K to prevent INR from increasing further. Will continue to monitor closely.   INR supratherpautic: 5.5  Goal of Therapy:  INR: 2-3 Heparin level 0.3-0.7 units/ml Monitor platelets by anticoagulation protocol: Yes   Plan:  -HOLD warfarin -Daily PT/INR -Monitor for s/sx of bleeding   Ruben Im, PharmD Clinical Pharmacist 04/23/2018 12:02 PM

## 2018-04-23 NOTE — Progress Notes (Signed)
Per Dr. Imogene Burn verbal orders, stopped patient Heparin at 77ml/hr. Patient has a critical INR value of 5.5. Notified pharmacy. Will continue to monitor patient.

## 2018-04-24 LAB — CBC
HCT: 37.7 % (ref 36.0–46.0)
Hemoglobin: 12.3 g/dL (ref 12.0–15.0)
MCH: 28.5 pg (ref 26.0–34.0)
MCHC: 32.6 g/dL (ref 30.0–36.0)
MCV: 87.3 fL (ref 78.0–100.0)
Platelets: 333 10*3/uL (ref 150–400)
RBC: 4.32 MIL/uL (ref 3.87–5.11)
RDW: 14.2 % (ref 11.5–15.5)
WBC: 9.3 10*3/uL (ref 4.0–10.5)

## 2018-04-24 LAB — PROTIME-INR
INR: 4.06 — AB
Prothrombin Time: 39.1 seconds — ABNORMAL HIGH (ref 11.4–15.2)

## 2018-04-24 NOTE — Progress Notes (Signed)
Occupational Therapy Treatment Patient Details Name: Alison Lynch MRN: [id number] DOB: 04/03/1961 Today's Date: 04/24/2018    History of present illness 57 y.o. female admitted with c/o right leg pain that started 5/21. Now s/p  bilateral redo common  femoral artery exposure, thrombectomy of aortobifemoral bypass graft 5/22. PMH includes: PAD, PTSD, cervical myelopathy, back surgery, L AKA, Bilateral femoral artery bypass graft.    OT comments  Pt progressing towards established OT goals. Pt performing sponge bath at sink and donning new gown, underwear, and sock with supervision-Min Guard A for safety in standing. Pt demonstrating increase strength and balance. Continue to recommend dc home once medically stable per physician. Will continue to follow acutely as admitted.    Follow Up Recommendations  No OT follow up    Equipment Recommendations  None recommended by OT    Recommendations for Other Services      Precautions / Restrictions Precautions Precautions: Fall Restrictions Weight Bearing Restrictions: No Other Position/Activity Restrictions: old L AKA       Mobility Bed Mobility Overal bed mobility: Modified Independent             General bed mobility comments: Increased time  Transfers Overall transfer level: Needs assistance Equipment used: Rolling walker (2 wheeled) Transfers: Sit to/from [employer] Sit to Stand: Min guard Stand pivot transfers: Min guard       General transfer comment: safety    Balance Overall balance assessment: Needs assistance Sitting-balance support: No upper extremity supported;Feet supported Sitting balance-Leahy Scale: Good     Standing balance support: Bilateral upper extremity supported Standing balance-Leahy Scale: Poor Standing balance comment: walker and supervision for safety                           ADL either performed or assessed with clinical judgement   ADL Overall ADL's : Needs  assistance/impaired     Grooming: Wash/dry face;Set up;Sitting Grooming Details (indicate cue type and reason): Pt washing her face at the sink while seated with set up Upper Body Bathing: Set up;Supervision/ safety;Sitting   Lower Body Bathing: Sit to/from stand;Min guard;Sitting/lateral leans   Upper Body Dressing : Set up;Sitting Upper Body Dressing Details (indicate cue type and reason): donned new gown Lower Body Dressing: Sit to/from stand;Min guard;Sitting/lateral leans Lower Body Dressing Details (indicate cue type and reason): can don sock without difficulty. Min Guard A for safety when Development worker, community Transfer: Min Acupuncturist Details (indicate cue type and reason): Pt performing toilet transfer to [facility] to perform sponge bath at sink. Min Guard for safety         Functional mobility during ADLs: Min guard;Rolling walker General ADL Comments: Performing ADLs and functional mobility with min guard a for safety     Vision       Perception     Praxis      Cognition Arousal/Alertness: Awake/alert Behavior During Therapy: WFL for tasks assessed/performed Overall Cognitive Status: Within Functional Limits for tasks assessed                                          Exercises     Shoulder Instructions       General Comments      Pertinent Vitals/ Pain       Pain Assessment: No/denies pain  Home Living  Prior Functioning/Environment              Frequency  Min 2X/week        Progress Toward Goals  OT Goals(current goals can now be found in the care plan section)  Progress towards OT goals: Progressing toward goals  Acute Rehab OT Goals Patient Stated Goal: return home and go to OP therapy OT Goal Formulation: With patient Time For Goal Achievement: 05/04/18 Potential to Achieve Goals: Good ADL Goals Pt Will Perform Lower Body  Bathing: with modified independence;sitting/lateral leans;sit to/from stand Pt Will Perform Lower Body Dressing: with modified independence;sit to/from stand;sitting/lateral leans Pt Will Transfer to Toilet: with modified independence;ambulating;regular height toilet Pt Will Perform Toileting - Clothing Manipulation and hygiene: with modified independence;sitting/lateral leans;sit to/from stand Pt Will Perform Tub/Shower Transfer: with supervision;ambulating;shower seat;rolling walker  Plan Discharge plan remains appropriate    Co-evaluation                 AM-PAC PT "6 Clicks" Daily Activity     Outcome Measure   Help from another person eating meals?: None Help from another person taking care of personal grooming?: A Little Help from another person toileting, which includes using toliet, bedpan, or urinal?: A Little Help from another person bathing (including washing, rinsing, drying)?: A Little Help from another person to put on and taking off regular upper body clothing?: None Help from another person to put on and taking off regular lower body clothing?: A Little 6 Click Score: 20    End of Session Equipment Utilized During Treatment: Rolling walker  OT Visit Diagnosis: Unsteadiness on feet (R26.81);Other abnormalities of gait and mobility (R26.89);Pain   Activity Tolerance Patient tolerated treatment well   Patient Left with call bell/phone within reach;in chair;with chair alarm set   Nurse Communication Mobility status;Other (comment)(bath complete)    Functional Assessment Tool Used: AM-PAC 6 Clicks Daily Activity   Time: 1027-1050 OT Time Calculation (min): 23 min  Charges:  OT General Charges $OT Visit: 1 Visit OT Treatments $Self Care/Home Management : 23-37 mins  Newman Waren MSOT, OTR/L Acute Rehab Pager: [phone] Office: [phone]   Theodoro Grist Orazio Weller 04/24/2018, 1:16 PM

## 2018-04-24 NOTE — Progress Notes (Signed)
ANTICOAGULATION CONSULT NOTE   Pharmacy Consult for coumadin  Indication: arterial occlusion  Allergies  Allergen Reactions  . Clindamycin/Lincomycin Rash   Patient Measurements: Height: 5' (152.4 cm) Weight: 110 lb (49.9 kg) IBW/kg (Calculated) : 45.5 Heparin Dosing Weight: 49.9kg  Vital Signs: Temp: 99 F (37.2 C) (05/27 0804) Temp Source: Oral (05/27 0804) BP: 115/72 (05/27 0804) Pulse Rate: 73 (05/27 0804)  Labs: Recent Labs    04/22/18 0415 04/22/18 1602 04/22/18 2243 04/23/18 0500 04/23/18 0653 04/24/18 0522  HGB 6.7* 11.8*  --  11.9*  --  12.3  HCT 21.5* 36.1  --  35.5*  --  37.7  PLT 275  --   --  303  --  333  LABPROT 17.3*  --   --  47.6* 49.6* 39.1*  INR 1.43  --   --  5.21* 5.50* 4.06*  HEPARINUNFRC 0.21* 0.30 0.28* 0.31 0.36  --   CREATININE 1.92*  --   --  1.85*  --   --    Estimated Creatinine Clearance: 24.7 mL/min (A) (by C-G formula based on SCr of 1.85 mg/dL (H)).  Medical History: Past Medical History:  Diagnosis Date  . Cervical myelopathy (HCC)   . CKD (chronic kidney disease), stage III (HCC)   . 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 Depression   . Dysphagia   . Hx of migraine headaches   . Hyperlipidemia   . Hypothyroid   . Leriche syndrome (HCC) 12/2016   Chronically occluded aorta  . Osteoporosis   . PAD (peripheral artery disease) (HCC)    Status post left AKA for extensive thromboembolism, right leg has in-line flow through popliteal artery only.  Bilateral iliac stents occluded  . Panic attacks   . PTSD (post-traumatic stress disorder)    Medications:  Infusions:  . sodium chloride 10 mL/hr at 04/22/18 0034  . magnesium sulfate 1 - 4 g bolus IVPB     Assessment: 57 yof presented to the ED from OSH with an arterial occlusion. Pharmacy consulted to resume heparin following bilateral femoral exploration with  bilateral femoral thrombectomy and intraoperative arteriogram on 5/22. Patient received two doses of warfarin and the INR jumped 1.4>5.5. Heparin infusion was stopped and warfarin held. Provider gave low dose vitamin K on 5/26 to prevent INR from increasing further. Will continue to monitor closely.   INR supratherpautic: 5.5>4.06  Goal of Therapy:  INR: 2-3 Heparin level 0.3-0.7 units/ml Monitor platelets by anticoagulation protocol: Yes   Plan:  -Hold warfarin -Daily PT/INR -Monitor for s/sx of bleeding  Adline Potter, PharmD Pharmacy Resident Pager: [phone]

## 2018-04-24 NOTE — Progress Notes (Addendum)
  Progress Note    04/24/2018 1:26 PM 5 Days Post-Op  Subjective:  No new complaints this morning   Vitals:   04/24/18 0804 04/24/18 1232  BP: 115/72 113/79  Pulse: 73 (!) 102  Resp: 18 18  Temp: 99 F (37.2 C) 98.9 F (37.2 C)  SpO2: 91% 100%   Physical Exam: Lungs:  Non labored Incisions:  B groin incisions stable, soft; some serous collection on L groin dressing but no active drainage with manipulation Extremities:  R palpable PT; stump without active tissue ischemia Abdomen:  Soft Neurologic: A&O  CBC    Component Value Date/Time   WBC 9.3 04/24/2018 0522   RBC 4.32 04/24/2018 0522   HGB 12.3 04/24/2018 0522   HCT 37.7 04/24/2018 0522   PLT 333 04/24/2018 0522   MCV 87.3 04/24/2018 0522   MCH 28.5 04/24/2018 0522   MCHC 32.6 04/24/2018 0522   RDW 14.2 04/24/2018 0522   LYMPHSABS 0.4 (L) 04/19/2018 0802   MONOABS 0.4 04/19/2018 0802   EOSABS 0.0 04/19/2018 0802   BASOSABS 0.1 04/19/2018 0802    BMET    Component Value Date/Time   NA 140 04/23/2018 0500   NA 144 12/06/2017 1517   K 3.8 04/23/2018 0500   CL 105 04/23/2018 0500   CO2 27 04/23/2018 0500   GLUCOSE 104 (H) 04/23/2018 0500   BUN 13 04/23/2018 0500   BUN 17 12/06/2017 1517   CREATININE 1.85 (H) 04/23/2018 0500   CALCIUM 8.6 (L) 04/23/2018 0500   GFRNONAA 30 (L) 04/23/2018 0500   GFRAA 34 (L) 04/23/2018 0500    INR    Component Value Date/Time   INR 4.06 (HH) 04/24/2018 0522     Intake/Output Summary (Last 24 hours) at 04/24/2018 1326 Last data filed at 04/24/2018 0900 Gross per 24 hour  Intake 480 ml  Output -  Net 480 ml     Assessment/Plan:  57 y.o. female is s/p thrombectomy ABF 5 Days Post-Op   INR 4 today; holding coumadin per pharmacy Encouraged mobility today Continue dry dressing changes daily and prn to L groin Perfusing R foot with palpable R PTA D/c home when INR therapeutic; would wait to pull R IJ central line when INR closer to 2   Emilie Rutter,  PA-C [facility] [phone] 04/24/2018 1:26 PM  Addendum  I have independently interviewed and examined the patient, and I agree with the physician assistant's findings.  Coumadin per pharmacy.  Leonides Sake, MD, FACS [facility] [place] Office: [phone] Pager: [phone]  04/24/2018, 1:57 PM

## 2018-04-24 NOTE — Progress Notes (Signed)
Physical Therapy Treatment Patient Details Name: Marinell Igarashi MRN: [id number] DOB: June 20, 1961 Today's Date: 04/24/2018    History of Present Illness 57 y.o. female admitted with c/o right leg pain that started 5/21. Now s/p  bilateral redo common  femoral artery exposure, thrombectomy of aortobifemoral bypass graft 5/22. PMH includes: PAD, PTSD, cervical myelopathy, back surgery, L AKA, Bilateral femoral artery bypass graft.     PT Comments    Pt doing well with mobility. At or very close to baseline. Recommend return home to prior living situation.   Follow Up Recommendations  No PT follow up;Supervision - Intermittent(Pt working with prosthetist to have prosthetic adjuseted)     Equipment Recommendations  None recommended by PT    Recommendations for Other Services       Precautions / Restrictions Precautions Precautions: Fall Restrictions Weight Bearing Restrictions: No Other Position/Activity Restrictions: old L AKA    Mobility  Bed Mobility Overal bed mobility: Modified Independent             General bed mobility comments: Increased time  Transfers Overall transfer level: Modified independent Equipment used: Rolling walker (2 wheeled) Transfers: Sit to/from Stand Sit to Stand: Modified independent (Device/Increase time) Stand pivot transfers: Min guard       General transfer comment: safety  Ambulation/Gait Ambulation/Gait assistance: Supervision Ambulation Distance (Feet): 100 Feet Assistive device: Rolling walker (2 wheeled) Gait Pattern/deviations: Step-to pattern Gait velocity: decr Gait velocity interpretation: <1.31 ft/sec, indicative of household ambulator General Gait Details: Pt with steady gait with walker   Stairs             Wheelchair Mobility    Modified Rankin (Stroke Patients Only)       Balance Overall balance assessment: Needs assistance Sitting-balance support: No upper extremity supported;Feet supported Sitting  balance-Leahy Scale: Good     Standing balance support: Bilateral upper extremity supported Standing balance-Leahy Scale: Poor Standing balance comment: walker and supervision for safety                            Cognition Arousal/Alertness: Awake/alert Behavior During Therapy: WFL for tasks assessed/performed Overall Cognitive Status: Within Functional Limits for tasks assessed                                        Exercises      General Comments        Pertinent Vitals/Pain Pain Assessment: No/denies pain    Home Living                      Prior Function            PT Goals (current goals can now be found in the care plan section) Acute Rehab PT Goals Patient Stated Goal: return home and go to OP therapy Progress towards PT goals: Progressing toward goals    Frequency    Min 3X/week      PT Plan Current plan remains appropriate    Co-evaluation              AM-PAC PT "6 Clicks" Daily Activity  Outcome Measure  Difficulty turning over in bed (including adjusting bedclothes, sheets and blankets)?: None Difficulty moving from lying on back to sitting on the side of the bed? : None Difficulty sitting down on and standing up from a chair with arms (  e.g., wheelchair, bedside commode, etc,.)?: A Little Help needed moving to and from a bed to chair (including a wheelchair)?: None Help needed walking in hospital room?: A Little Help needed climbing 3-5 steps with a railing? : A Little 6 Click Score: 21    End of Session Equipment Utilized During Treatment: Gait belt Activity Tolerance: Patient tolerated treatment well Patient left: with call bell/phone within reach;in bed Nurse Communication: Mobility status PT Visit Diagnosis: Unsteadiness on feet (R26.81);Difficulty in walking, not elsewhere classified (R26.2)     Time: 4098-1191 PT Time Calculation (min) (ACUTE ONLY): 10 min  Charges:  $Gait Training: 8-22  mins                    G Codes:       [facility] PT [phone]    Angelina Ok [facility] 04/24/2018, 3:20 PM

## 2018-04-24 NOTE — Plan of Care (Signed)
  Problem: Education: Goal: Knowledge of General Education information will improve Outcome: Progressing   Problem: Health Behavior/Discharge Planning: Goal: Ability to manage health-related needs will improve Outcome: Progressing   Problem: Clinical Measurements: Goal: Respiratory complications will improve Outcome: Progressing Goal: Cardiovascular complication will be avoided Outcome: Progressing   Problem: Elimination: Goal: Will not experience complications related to bowel motility Outcome: Not Progressing   Problem: Elimination: Goal: Will not experience complications related to urinary retention Outcome: Completed/Met

## 2018-04-25 LAB — CBC
HCT: 37.4 % (ref 36.0–46.0)
HEMOGLOBIN: 12.2 g/dL (ref 12.0–15.0)
MCH: 29.3 pg (ref 26.0–34.0)
MCHC: 32.6 g/dL (ref 30.0–36.0)
MCV: 89.7 fL (ref 78.0–100.0)
Platelets: 372 10*3/uL (ref 150–400)
RBC: 4.17 MIL/uL (ref 3.87–5.11)
RDW: 14.3 % (ref 11.5–15.5)
WBC: 10.3 10*3/uL (ref 4.0–10.5)

## 2018-04-25 LAB — PROTIME-INR
INR: 2.27
Prothrombin Time: 24.9 seconds — ABNORMAL HIGH (ref 11.4–15.2)

## 2018-04-25 MED ORDER — WARFARIN SODIUM 5 MG PO TABS: 2.5000 mg | ORAL_TABLET | Freq: Every day | ORAL | 2 refills | Status: DC

## 2018-04-25 MED ORDER — COUMADIN BOOK
Freq: Once | Status: DC
  Filled 2018-04-25: qty 1

## 2018-04-25 MED ORDER — WARFARIN VIDEO
Freq: Once | Status: AC
  Administered 2018-04-25: 10:00:00

## 2018-04-25 MED ORDER — WARFARIN SODIUM 5 MG PO TABS: 5.0000 mg | ORAL_TABLET | Freq: Every day | ORAL | 2 refills | Status: DC

## 2018-04-25 MED ORDER — PATIENT'S GUIDE TO USING COUMADIN BOOK
Freq: Once | Status: DC
  Filled 2018-04-25 (×2): qty 1

## 2018-04-25 NOTE — Progress Notes (Addendum)
  Progress Note    04/25/2018 7:23 AM 6 Days Post-Op  Subjective:  No comlaints  Tm 99.4 now 98.9 HR 70's-110's NSR 90's-110's systolic 94% RA  Vitals:   04/25/18 0029 04/25/18 0408  BP: (!) 98/58 (!) 90/49  Pulse: 75 68  Resp: 20 16  Temp: 99.4 F (37.4 C) 98.9 F (37.2 C)  SpO2: 92% 93%    Physical Exam: Cardiac:  regular Lungs:  Non labored Incisions:  Right groin is clean and dry-left groin is clean without hematoma/erythema.  Could not express any drainage but there was minimal serosanguinous drainage on the bandage Extremities:  + monophasic doppler signals right DP/PT Abdomen:  Soft/NT/ND  CBC    Component Value Date/Time   WBC 10.3 04/25/2018 0524   RBC 4.17 04/25/2018 0524   HGB 12.2 04/25/2018 0524   HCT 37.4 04/25/2018 0524   PLT 372 04/25/2018 0524   MCV 89.7 04/25/2018 0524   MCH 29.3 04/25/2018 0524   MCHC 32.6 04/25/2018 0524   RDW 14.3 04/25/2018 0524   LYMPHSABS 0.4 (L) 04/19/2018 0802   MONOABS 0.4 04/19/2018 0802   EOSABS 0.0 04/19/2018 0802   BASOSABS 0.1 04/19/2018 0802    BMET    Component Value Date/Time   NA 140 04/23/2018 0500   NA 144 12/06/2017 1517   K 3.8 04/23/2018 0500   CL 105 04/23/2018 0500   CO2 27 04/23/2018 0500   GLUCOSE 104 (H) 04/23/2018 0500   BUN 13 04/23/2018 0500   BUN 17 12/06/2017 1517   CREATININE 1.85 (H) 04/23/2018 0500   CALCIUM 8.6 (L) 04/23/2018 0500   GFRNONAA 30 (L) 04/23/2018 0500   GFRAA 34 (L) 04/23/2018 0500    INR    Component Value Date/Time   INR 2.27 04/25/2018 0524     Intake/Output Summary (Last 24 hours) at 04/25/2018 0723 Last data filed at 04/24/2018 2300 Gross per 24 hour  Intake 840 ml  Output -  Net 840 ml     Assessment:  57 y.o. female is s/p:  thrombectomy ABF  6 Days Post-Op  Plan: -pt with + doppler signal right DP/PT -small amount of serosanguinous drainage left groin-no erythema or evidence of infection. -DVT prophylaxis:  Coumadin-INR is 2.27 down from  2.27-she has not had coumadin in 2 days.  -continue to mobilize and use IS   Doreatha Massed, PA-C [facility] [phone] 04/25/2018 7:23 AM   I have independently interviewed and examined patient and agree with PA assessment and plan above.  INR is now in therapeutic range.  Given that this is prophylactic she is okay to discharge and will need recheck again this week.  Should be safe now to pull right IJ catheter.  Kiano Terrien C. Randie Heinz, MD [facility] [place] Office: [phone] Pager: [phone]

## 2018-04-25 NOTE — Care Management Important Message (Signed)
Important Message  Patient Details  Name: Alison Lynch MRN: [id number] Date of Birth: Mar 02, 1961   Medicare Important Message Given:  Yes    Ulrick Methot P Lajuan Kovaleski 04/25/2018, 3:05 PM

## 2018-04-25 NOTE — Progress Notes (Signed)
Physical Therapy Treatment Patient Details Name: Alison Lynch MRN: [id number] DOB: 1961-04-02 Today's Date: 04/25/2018    History of Present Illness 57 y.o. female admitted with c/o right leg pain that started 5/21. Now s/p  bilateral redo common  femoral artery exposure, thrombectomy of aortobifemoral bypass graft 5/22. PMH includes: PAD, PTSD, cervical myelopathy, back surgery, L AKA, Bilateral femoral artery bypass graft.     PT Comments    Pt doing very well with mobility and ready for dc home. Pt able to mobilize on her own.   Follow Up Recommendations  No PT follow up;Supervision - Intermittent(Pt working with prosthetist to have prosthetic adjuseted)     Equipment Recommendations  None recommended by PT    Recommendations for Other Services       Precautions / Restrictions Precautions Precautions: Fall Restrictions Weight Bearing Restrictions: No    Mobility  Bed Mobility Overal bed mobility: Modified Independent                Transfers Overall transfer level: Modified independent Equipment used: Rolling walker (2 wheeled) Transfers: Sit to/from Stand Sit to Stand: Modified independent (Device/Increase time)            Ambulation/Gait Ambulation/Gait assistance: Modified independent (Device/Increase time) Ambulation Distance (Feet): 120 Feet Assistive device: Rolling walker (2 wheeled) Gait Pattern/deviations: Step-to pattern Gait velocity: decr Gait velocity interpretation: <1.31 ft/sec, indicative of household ambulator General Gait Details: Pt with steady gait with walker   Stairs             Wheelchair Mobility    Modified Rankin (Stroke Patients Only)       Balance Overall balance assessment: Needs assistance Sitting-balance support: No upper extremity supported;Feet supported Sitting balance-Leahy Scale: Good     Standing balance support: Bilateral upper extremity supported Standing balance-Leahy Scale: Poor Standing  balance comment: walker for support due to AKA                            Cognition Arousal/Alertness: Awake/alert Behavior During Therapy: WFL for tasks assessed/performed Overall Cognitive Status: Within Functional Limits for tasks assessed                                        Exercises      General Comments        Pertinent Vitals/Pain Pain Assessment: No/denies pain    Home Living                      Prior Function            PT Goals (current goals can now be found in the care plan section) Progress towards PT goals: Goals met/education completed, patient discharged from PT    Frequency    Min 3X/week      PT Plan Current plan remains appropriate    Co-evaluation              AM-PAC PT "6 Clicks" Daily Activity  Outcome Measure  Difficulty turning over in bed (including adjusting bedclothes, sheets and blankets)?: None Difficulty moving from lying on back to sitting on the side of the bed? : None Difficulty sitting down on and standing up from a chair with arms (e.g., wheelchair, bedside commode, etc,.)?: A Little Help needed moving to and from a bed to chair (including a wheelchair)?: None  Help needed walking in hospital room?: None Help needed climbing 3-5 steps with a railing? : A Little 6 Click Score: 22    End of Session Equipment Utilized During Treatment: Gait belt Activity Tolerance: Patient tolerated treatment well Patient left: with call bell/phone within reach;in bed Nurse Communication: Mobility status PT Visit Diagnosis: Difficulty in walking, not elsewhere classified (R26.2)     Time: 9480-1655 PT Time Calculation (min) (ACUTE ONLY): 8 min  Charges:  $Gait Training: 8-22 mins                    G Codes:       [facility] PT [place] 04/25/2018, 12:12 PM

## 2018-04-25 NOTE — Discharge Instructions (Addendum)
° °[facility] [place] ° °Discharge instructions ° °Lower Extremity Bypass Surgery ° °Please refer to the following instruction for your post-procedure care. Your surgeon or physician assistant will discuss any changes with you. ° °Activity ° °You are encouraged to walk as much as you can. You can slowly return to normal activities during the month after your surgery. Avoid strenuous activity and heavy lifting until your doctor tells you it's OK. Avoid activities such as vacuuming or swinging a golf club. Do not drive until your doctor give the OK and you are no longer taking prescription pain medications. It is also normal to have difficulty with sleep habits, eating and bowel movement after surgery. These will go away with time. ° °Bathing/Showering ° °Shower daily after you go home. Do not soak in a bathtub, hot tub, or swim until the incision heals completely. ° °Incision Care ° °Clean your incision with mild soap and water. Shower every day. Pat the area dry with a clean towel. You do not need a bandage unless otherwise instructed. Do not apply any ointments or creams to your incision. If you have open wounds you will be instructed how to care for them or a visiting nurse may be arranged for you. If you have staples or sutures along your incision they will be removed at your post-op appointment. You may have skin glue on your incision. Do not peel it off. It will come off on its own in about one week. ° °Wash the groin wound with soap and water daily and pat dry. (No tub bath-only shower)  Then put a dry gauze or washcloth in the groin to keep this area dry to help prevent wound infection.  Do this daily and as needed.  Do not use Vaseline or neosporin on your incisions.  Only use soap and water on your incisions and then protect and keep dry. ° °Diet ° °Resume your normal diet. There are no special food restrictions following this procedure. A low fat/ low cholesterol diet is  recommended for all patients with vascular disease. In order to heal from your surgery, it is CRITICAL to get adequate nutrition. Your body requires vitamins, minerals, and protein. Vegetables are the best source of vitamins and minerals. Vegetables also provide the perfect balance of protein. Processed food has little nutritional value, so try to avoid this. ° °Medications ° °Resume taking all your medications unless your doctor or physician assistant tells you not to. If your incision is causing pain, you may take over-the-counter pain relievers such as acetaminophen (Tylenol). If you were prescribed a stronger pain medication, please aware these medication can cause nausea and constipation. Prevent nausea by taking the medication with a snack or meal. Avoid constipation by drinking plenty of fluids and eating foods with high amount of fiber, such as fruits, vegetables, and grains. Take Colace 100 mg (an over-the-counter stool softener) twice a day as needed for constipation.  °Do not take Tylenol if you are taking prescription pain medications. ° °Follow Up ° °Our office will schedule a follow up appointment 2-3 weeks following discharge. ° °Please call us immediately for any of the following conditions ° °•Severe or worsening pain in your legs or feet while at rest or while walking •Increase pain, redness, warmth, or drainage (pus) from your incision site(s) °Fever of 101 degree or higher °The swelling in your leg with the bypass suddenly worsens and becomes more painful than when you were in the hospital °If you have   been instructed to feel your graft pulse then you should do so every day. If you can no longer feel this pulse, call the office immediately. Not all patients are given this instruction.  Leg swelling is common after leg bypass surgery.  The swelling should improve over a few months following surgery. To improve the swelling, you may elevate your legs above the level of your heart while you are  sitting or resting. Your surgeon or physician assistant may ask you to apply an ACE wrap or wear compression (TED) stockings to help to reduce swelling.  Reduce your risk of vascular disease  Stop smoking. If you would like help call [facility] at 1-800-QUIT-NOW (940-485-3621) or [place] at [phone].  Manage your cholesterol Maintain a desired weight Control your diabetes weight Control your diabetes Keep your blood pressure down  If you have any questions, please call the office at [phone]  Information on my medicine - Coumadin   (Warfarin)  This medication education was reviewed with me or my healthcare representative as part of my discharge preparation.  Why was Coumadin prescribed for you? Coumadin was prescribed for you because you have a blood clot or a medical condition that can cause an increased risk of forming blood clots. Blood clots can cause serious health problems by blocking the flow of blood to the heart, lung, or brain. Coumadin can prevent harmful blood clots from forming. As a reminder your indication for Coumadin is:   Arterial occlusion  What test will check on my response to Coumadin? While on Coumadin (warfarin) you will need to have an INR test regularly to ensure that your dose is keeping you in the desired range. The INR (international normalized ratio) number is calculated from the result of the laboratory test called prothrombin time (PT).  If an INR APPOINTMENT HAS NOT ALREADY BEEN MADE FOR YOU please schedule an appointment to have this lab work done by your health care provider within 7 days. Your INR goal is usually a number between:  2 to 3 or your provider may give you a more narrow range like 2-2.5.  Ask your health care provider during an office visit what your goal INR is.  What  do you need to  know  About  COUMADIN? Take Coumadin (warfarin) exactly as prescribed by your healthcare provider about the same time each day.  DO NOT stop  taking without talking to the doctor who prescribed the medication.  Stopping without other blood clot prevention medication to take the place of Coumadin may increase your risk of developing a new clot or stroke.  Get refills before you run out.  What do you do if you miss a dose? If you miss a dose, take it as soon as you remember on the same day then continue your regularly scheduled regimen the next day.  Do not take two doses of Coumadin at the same time.  Important Safety Information A possible side effect of Coumadin (Warfarin) is an increased risk of bleeding. You should call your healthcare provider right away if you experience any of the following: ? Bleeding from an injury or your nose that does not stop. ? Unusual colored urine (red or dark brown) or unusual colored stools (red or black). ? Unusual bruising for unknown reasons. ? A serious fall or if you hit your head (even if there is no bleeding).  Some foods or medicines interact with Coumadin (warfarin) and might alter your response to warfarin. To help avoid  this: ? Eat a balanced diet, maintaining a consistent amount of Vitamin K. ? Notify your provider about major diet changes you plan to make. ? Avoid alcohol or limit your intake to 1 drink for women and 2 drinks for men per day. (1 drink is 5 oz. wine, 12 oz. beer, or 1.5 oz. liquor.)  Make sure that ANY health care provider who prescribes medication for you knows that you are taking Coumadin (warfarin).  Also make sure the healthcare provider who is monitoring your Coumadin knows when you have started a new medication including herbals and non-prescription products.  Coumadin (Warfarin)  Major Drug Interactions  Increased Warfarin Effect Decreased Warfarin Effect  Alcohol (large quantities) Antibiotics (esp. Septra/Bactrim, Flagyl, Cipro) Amiodarone (Cordarone) Aspirin (ASA) Cimetidine (Tagamet) Megestrol (Megace) NSAIDs (ibuprofen, naproxen, etc.) Piroxicam  (Feldene) Propafenone (Rythmol SR) Propranolol (Inderal) Isoniazid (INH) Posaconazole (Noxafil) Barbiturates (Phenobarbital) Carbamazepine (Tegretol) Chlordiazepoxide (Librium) Cholestyramine (Questran) Griseofulvin Oral Contraceptives Rifampin Sucralfate (Carafate) Vitamin K   Coumadin (Warfarin) Major Herbal Interactions  Increased Warfarin Effect Decreased Warfarin Effect  Garlic Ginseng Ginkgo biloba Coenzyme Q10 Green tea St. Johns wort    Coumadin (Warfarin) FOOD Interactions  Eat a consistent number of servings per week of foods HIGH in Vitamin K (1 serving =  cup)  Collards (cooked, or boiled & drained) Kale (cooked, or boiled & drained) Mustard greens (cooked, or boiled & drained) Parsley *serving size only =  cup Spinach (cooked, or boiled & drained) Swiss chard (cooked, or boiled & drained) Turnip greens (cooked, or boiled & drained)  Eat a consistent number of servings per week of foods MEDIUM-HIGH in Vitamin K (1 serving = 1 cup)  Asparagus (cooked, or boiled & drained) Broccoli (cooked, boiled & drained, or raw & chopped) Brussel sprouts (cooked, or boiled & drained) *serving size only =  cup Lettuce, raw (green leaf, endive, romaine) Spinach, raw Turnip greens, raw & chopped   These websites have more information on Coumadin (warfarin):  http://www.king-russell.com/; https://www.hines.net/;

## 2018-04-25 NOTE — Progress Notes (Signed)
Patient is ready for discharge. Patient has all of her belongings. She is alert and oriented and vital signs are stable. Patient has had all of her questions regarding her discharge answered. One IV in the left forearm has been removed, catheter intact and no complications. Patient has been taken off telemetry and CCMD has been notified. She will be transported home by her brother, Casimiro Needle who is here with her now. She will leave the unit by wheelchair and meet her brother at the front entrance of the hospital.
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
## 2018-04-25 NOTE — Progress Notes (Signed)
Clinical Social Worker met pateint at bedside in regards to short term rehab. No family at bedside with patient. Patient and CSW spoke in detail about her discharge plan. Patient stated she lives at home with husband who assist her with her needs. Patient stated her family (aunt) wants her to go to rehab for 30 days so she will be force to do her exercise. Patient stated she is able to do the workout but just choices not to do it. Patient feels she does not need rehab stay. Per physical therapy note patient is close or at baseline. CSW explained to patient that if she choices to go to rehab insurance may or may not pay for it but CSW is willing to fax her out. Patient stated she will just go home and work on her exercises on her own. CSW signing off as patient no longer has social work needs.   Alison Lynch, MSW,  [place]

## 2018-04-26 ENCOUNTER — Telehealth: Payer: Self-pay | Admitting: Vascular Surgery

## 2018-04-26 NOTE — Telephone Encounter (Signed)
Sch appt lvm 04/28/18 845am P/O MD s/p Bil Fem Embolectomy per stf msg

## 2018-04-26 NOTE — Telephone Encounter (Signed)
-----   Message from Westley Hummer, RN sent at 04/21/2018  4:41 PM EDT ----- Regarding: Appointment   ----- Message ----- From: Dara Lords, PA-C Sent: 04/21/2018   3:59 PM To: Vvs Charge Pool  S/p bilateral femoral embolectomy.  F/u with Dr. Randie Heinz in 2-3 weeks.  Thanks

## 2018-04-27 DIAGNOSIS — E039 Hypothyroidism, unspecified: Secondary | ICD-10-CM | POA: Diagnosis not present

## 2018-04-27 DIAGNOSIS — Z7901 Long term (current) use of anticoagulants: Secondary | ICD-10-CM | POA: Diagnosis not present

## 2018-04-28 ENCOUNTER — Encounter: Payer: Medicare HMO | Admitting: Vascular Surgery

## 2018-05-05 ENCOUNTER — Other Ambulatory Visit: Payer: Self-pay

## 2018-05-05 ENCOUNTER — Encounter: Payer: Self-pay | Admitting: Vascular Surgery

## 2018-05-05 ENCOUNTER — Ambulatory Visit (INDEPENDENT_AMBULATORY_CARE_PROVIDER_SITE_OTHER): Payer: Self-pay | Admitting: Vascular Surgery

## 2018-05-05 VITALS — BP 107/69 | HR 80 | Temp 98.2°F | Resp 16 | Ht 60.0 in | Wt 103.0 lb

## 2018-05-05 DIAGNOSIS — I739 Peripheral vascular disease, unspecified: Secondary | ICD-10-CM

## 2018-05-05 DIAGNOSIS — Z95828 Presence of other vascular implants and grafts: Secondary | ICD-10-CM

## 2018-05-05 NOTE — Progress Notes (Signed)
  Subjective:     Patient ID: Alison Lynch, female   DOB: 01/18/1961, 57 y.o.   MRN: [id number]  HPI 57 year old female follows up after hospitalization for occluded aortobifemoral bypass graft.  She also had some concern of clot near her renal and SMA arteries.  She now is healing her wounds in her groins.  She states that she has pain in her left above-knee amputation site that is like a toothache.  It will resolve.  She does not have any tissue loss or ulceration there.  Her right leg is overall feeling well.   Review of Systems Left AKA residual limb pain    Objective:   Physical Exam Awake alert and oriented Nonlabored respirations Abdomen is soft nontender Bilateral groin incisions are healing well. There is a palpable anterior tibial pulse of the ankle on the right. I cannot definitively feel pulse in the left groin.    Assessment/plan       57 year old female follows up from recent hospitalization with occluded aortobifemoral bypass graft.  At this time she may have reoccluded her left limb which I think was chronically occluded at the time of operation as she does have some pain in her left side residual limb.  Given that she has no tissue loss or ulceration I would not put her through further surgery at this time although in the future she may be looking for a femorofemoral bypass if we cannot get her pain controlled.  On the right side she does have good perfusion of her right lower extremity all the way down.  We will keep her on Coumadin and aspirin lifelong and she is getting INR checked.  We will have her follow-up in 3 months with ABI.  Virjean Boman C. Randie Heinzain, MD [facility] [place] Office: [phone] Pager: [phone]

## 2018-05-18 DIAGNOSIS — R69 Illness, unspecified: Secondary | ICD-10-CM | POA: Diagnosis not present

## 2018-05-19 ENCOUNTER — Inpatient Hospital Stay (HOSPITAL_COMMUNITY)
Admission: AD | Admit: 2018-05-19 | Discharge: 2018-05-22 | DRG: 863 | Disposition: A | Discharge status: Home / Self Care | Payer: Medicare HMO | Source: Other Acute Inpatient Hospital | Attending: Vascular Surgery | Admitting: Vascular Surgery

## 2018-05-19 DIAGNOSIS — R279 Unspecified lack of coordination: Secondary | ICD-10-CM | POA: Diagnosis not present

## 2018-05-19 DIAGNOSIS — Z79899 Other long term (current) drug therapy: Secondary | ICD-10-CM | POA: Diagnosis not present

## 2018-05-19 DIAGNOSIS — Z881 Allergy status to other antibiotic agents status: Secondary | ICD-10-CM

## 2018-05-19 DIAGNOSIS — F329 Major depressive disorder, single episode, unspecified: Secondary | ICD-10-CM | POA: Diagnosis present

## 2018-05-19 DIAGNOSIS — I129 Hypertensive chronic kidney disease with stage 1 through stage 4 chronic kidney disease, or unspecified chronic kidney disease: Secondary | ICD-10-CM | POA: Diagnosis not present

## 2018-05-19 DIAGNOSIS — F418 Other specified anxiety disorders: Secondary | ICD-10-CM | POA: Diagnosis present

## 2018-05-19 DIAGNOSIS — E785 Hyperlipidemia, unspecified: Secondary | ICD-10-CM | POA: Diagnosis not present

## 2018-05-19 DIAGNOSIS — T8149XA Infection following a procedure, other surgical site, initial encounter: Secondary | ICD-10-CM | POA: Diagnosis not present

## 2018-05-19 DIAGNOSIS — R69 Illness, unspecified: Secondary | ICD-10-CM | POA: Diagnosis not present

## 2018-05-19 DIAGNOSIS — E039 Hypothyroidism, unspecified: Secondary | ICD-10-CM | POA: Diagnosis not present

## 2018-05-19 DIAGNOSIS — F410 Panic disorder [episodic paroxysmal anxiety] without agoraphobia: Secondary | ICD-10-CM | POA: Diagnosis present

## 2018-05-19 DIAGNOSIS — Z79890 Hormone replacement therapy (postmenopausal): Secondary | ICD-10-CM

## 2018-05-19 DIAGNOSIS — T148XXA Other injury of unspecified body region, initial encounter: Secondary | ICD-10-CM

## 2018-05-19 DIAGNOSIS — T8141XA Infection following a procedure, superficial incisional surgical site, initial encounter: Secondary | ICD-10-CM | POA: Diagnosis not present

## 2018-05-19 DIAGNOSIS — Z7901 Long term (current) use of anticoagulants: Secondary | ICD-10-CM | POA: Diagnosis not present

## 2018-05-19 DIAGNOSIS — Z87891 Personal history of nicotine dependence: Secondary | ICD-10-CM

## 2018-05-19 DIAGNOSIS — N183 Chronic kidney disease, stage 3 (moderate): Secondary | ICD-10-CM | POA: Diagnosis not present

## 2018-05-19 DIAGNOSIS — F4310 Post-traumatic stress disorder, unspecified: Secondary | ICD-10-CM | POA: Diagnosis not present

## 2018-05-19 DIAGNOSIS — Z89612 Acquired absence of left leg above knee: Secondary | ICD-10-CM | POA: Diagnosis not present

## 2018-05-19 DIAGNOSIS — L02214 Cutaneous abscess of groin: Secondary | ICD-10-CM | POA: Diagnosis present

## 2018-05-19 DIAGNOSIS — T827XXA Infection and inflammatory reaction due to other cardiac and vascular devices, implants and grafts, initial encounter: Secondary | ICD-10-CM | POA: Diagnosis not present

## 2018-05-19 DIAGNOSIS — Z7982 Long term (current) use of aspirin: Secondary | ICD-10-CM | POA: Diagnosis not present

## 2018-05-19 DIAGNOSIS — I739 Peripheral vascular disease, unspecified: Secondary | ICD-10-CM | POA: Diagnosis present

## 2018-05-19 DIAGNOSIS — M810 Age-related osteoporosis without current pathological fracture: Secondary | ICD-10-CM | POA: Diagnosis not present

## 2018-05-19 DIAGNOSIS — Z743 Need for continuous supervision: Secondary | ICD-10-CM | POA: Diagnosis not present

## 2018-05-19 DIAGNOSIS — I9789 Other postprocedural complications and disorders of the circulatory system, not elsewhere classified: Secondary | ICD-10-CM | POA: Diagnosis not present

## 2018-05-19 DIAGNOSIS — R2241 Localized swelling, mass and lump, right lower limb: Secondary | ICD-10-CM | POA: Diagnosis not present

## 2018-05-19 DIAGNOSIS — Z7902 Long term (current) use of antithrombotics/antiplatelets: Secondary | ICD-10-CM | POA: Diagnosis not present

## 2018-05-19 DIAGNOSIS — L089 Local infection of the skin and subcutaneous tissue, unspecified: Secondary | ICD-10-CM | POA: Diagnosis present

## 2018-05-19 DIAGNOSIS — S31109A Unspecified open wound of abdominal wall, unspecified quadrant without penetration into peritoneal cavity, initial encounter: Secondary | ICD-10-CM | POA: Diagnosis not present

## 2018-05-19 DIAGNOSIS — M79661 Pain in right lower leg: Secondary | ICD-10-CM | POA: Diagnosis not present

## 2018-05-19 DIAGNOSIS — Y838 Other surgical procedures as the cause of abnormal reaction of the patient, or of later complication, without mention of misadventure at the time of the procedure: Secondary | ICD-10-CM | POA: Diagnosis not present

## 2018-05-19 DIAGNOSIS — A419 Sepsis, unspecified organism: Secondary | ICD-10-CM | POA: Diagnosis not present

## 2018-05-19 NOTE — H&P (Signed)
[facility]  Patient name: Alison Lynch MRN: [id number] DOB: [birth year] Sex: female   HPI: Alison Lynch is a 57 y.o. female admitted with right groin wound nonhealing.  She has a very complex past history.  She is status post aortobifemoral bypass for severe lower extremity arterial insufficiency.  She has suffered a left above-knee amputation in the past.  She was admitted 1 month ago with occlusion of her aortobifemoral bypass and underwent bilateral femoral thrombectomies.  This was on 04/19/2018.  She was seen by Dr. Randie Heinz on 05/05/2018 and had no issues with her groin healing.  She presented to [facility] this evening with concern regarding drainage from her right groin.  I was called in consultation and asked that she be transferred to [facility] for admission and treatment.  Past Medical History:  Diagnosis Date  . Cervical myelopathy (HCC)   . CKD (chronic kidney disease), stage III (HCC)   . 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 Depression   . Dysphagia   . Hx of migraine headaches   . Hyperlipidemia   . Hypothyroid   . Leriche syndrome (HCC) 12/2016   Chronically occluded aorta  . Osteoporosis   . PAD (peripheral artery disease) (HCC)    Status post left AKA for extensive thromboembolism, right leg has in-line flow through popliteal artery only.  Bilateral iliac stents occluded  . Panic attacks   . PTSD (post-traumatic stress disorder)     Family History  Problem Relation Age of Onset  . Cancer Maternal Grandmother   . Heart disease Maternal Grandfather        He died at 4. Unknown what heart disease    SOCIAL HISTORY: Social History   Tobacco Use  . Smoking status: Former Smoker    Packs/day: 0.50    Years: 40.00    Pack years: 20.00    Last attempt to quit: 12/21/2016     Years since quitting: 1.4  . Smokeless tobacco: Never Used  Substance Use Topics  . Alcohol use: No    Allergies  Allergen Reactions  . Clindamycin/Lincomycin Rash    No current facility-administered medications for this encounter.     REVIEW OF SYSTEMS:  Described in her past medical history  PHYSICAL EXAM: Vitals:   05/19/18 2209  BP: 136/77  Pulse: 86  Resp: 18  Temp: 98.3 F (36.8 C)  TempSrc: Oral  SpO2: 99%  Weight: 110 lb 7.2 oz (50.1 kg)    GENERAL: The patient is a well-nourished female, in no acute distress. The vital signs are documented above. CARDIOVASCULAR: She does not have a left femoral pulse.  She has slight separation in the upper pole of her left groin incision.  She has 2+ right popliteal pulse and a well-perfused right foot.  She has a well-healed left above-knee amputation PULMONARY: There is good air exchange  MUSCULOSKELETAL: There are no major deformities or cyanosis. NEUROLOGIC: No focal weakness or paresthesias are detected. SKIN: Her left groin has several areas which appear to be breakdown of these suture line with fat necrosis underneath this.  No evidence of graft exposure PSYCHIATRIC: The patient has a normal affect.  DATA:  None  MEDICAL ISSUES: Discussed with the patient and her family present.  Recommend taken to the operating room in the morning for surgical debridement.  Explained the concern regarding  involvement of her aortobifemoral bypass.  Plan that we will probably either packed the wound or place a VAC dressing after the debridement    Larina Earthlyodd F. Early, MD [facility] [place] Office Tel [phone] Pager [phone]

## 2018-05-20 ENCOUNTER — Encounter (HOSPITAL_COMMUNITY)
Admission: AD | Disposition: A | Discharge status: Home / Self Care | Payer: Self-pay | Source: Other Acute Inpatient Hospital | Attending: Vascular Surgery

## 2018-05-20 ENCOUNTER — Other Ambulatory Visit: Payer: Self-pay

## 2018-05-20 ENCOUNTER — Encounter (HOSPITAL_COMMUNITY): Payer: Self-pay

## 2018-05-20 ENCOUNTER — Inpatient Hospital Stay (HOSPITAL_COMMUNITY): Payer: Medicare HMO | Admitting: Anesthesiology

## 2018-05-20 DIAGNOSIS — T148XXA Other injury of unspecified body region, initial encounter: Secondary | ICD-10-CM

## 2018-05-20 DIAGNOSIS — L089 Local infection of the skin and subcutaneous tissue, unspecified: Secondary | ICD-10-CM

## 2018-05-20 DIAGNOSIS — I9789 Other postprocedural complications and disorders of the circulatory system, not elsewhere classified: Secondary | ICD-10-CM

## 2018-05-20 HISTORY — PX: FEMORAL ARTERY EXPLORATION: SHX5160

## 2018-05-20 HISTORY — DX: Local infection of the skin and subcutaneous tissue, unspecified: L08.9

## 2018-05-20 LAB — CBC
HEMATOCRIT: 37.7 % (ref 36.0–46.0)
Hemoglobin: 12 g/dL (ref 12.0–15.0)
MCH: 28.9 pg (ref 26.0–34.0)
MCHC: 31.8 g/dL (ref 30.0–36.0)
MCV: 90.8 fL (ref 78.0–100.0)
Platelets: 308 10*3/uL (ref 150–400)
RBC: 4.15 MIL/uL (ref 3.87–5.11)
RDW: 13 % (ref 11.5–15.5)
WBC: 7.4 10*3/uL (ref 4.0–10.5)

## 2018-05-20 LAB — COMPREHENSIVE METABOLIC PANEL
ALK PHOS: 83 U/L (ref 38–126)
ALT: 36 U/L (ref 14–54)
AST: 41 U/L (ref 15–41)
Albumin: 3.2 g/dL — ABNORMAL LOW (ref 3.5–5.0)
Anion gap: 11 (ref 5–15)
BILIRUBIN TOTAL: 0.2 mg/dL — AB (ref 0.3–1.2)
BUN: 12 mg/dL (ref 6–20)
CO2: 23 mmol/L (ref 22–32)
Calcium: 8.9 mg/dL (ref 8.9–10.3)
Chloride: 108 mmol/L (ref 101–111)
Creatinine, Ser: 1.88 mg/dL — ABNORMAL HIGH (ref 0.44–1.00)
GFR calc Af Amer: 34 mL/min — ABNORMAL LOW (ref >60)
GFR, EST NON AFRICAN AMERICAN: 29 mL/min — AB (ref >60)
Glucose, Bld: 253 mg/dL — ABNORMAL HIGH (ref 65–99)
Potassium: 4.2 mmol/L (ref 3.5–5.1)
Sodium: 142 mmol/L (ref 135–145)
TOTAL PROTEIN: 6.6 g/dL (ref 6.5–8.1)

## 2018-05-20 LAB — URINALYSIS, ROUTINE W REFLEX MICROSCOPIC
BACTERIA UA: NONE SEEN
Bilirubin Urine: NEGATIVE
Glucose, UA: NEGATIVE mg/dL
KETONES UR: NEGATIVE mg/dL
Nitrite: NEGATIVE
Protein, ur: NEGATIVE mg/dL
SPECIFIC GRAVITY, URINE: 1.005 (ref 1.005–1.030)
pH: 6 (ref 5.0–8.0)

## 2018-05-20 LAB — HIV ANTIBODY (ROUTINE TESTING W REFLEX): HIV SCREEN 4TH GENERATION: NONREACTIVE

## 2018-05-20 LAB — SURGICAL PCR SCREEN
MRSA, PCR: POSITIVE — AB
Staphylococcus aureus: POSITIVE — AB

## 2018-05-20 LAB — PROTIME-INR
INR: 2.55
PROTHROMBIN TIME: 27.2 s — AB (ref 11.4–15.2)

## 2018-05-20 SURGERY — EXPLORATION, ARTERY, FEMORAL
Anesthesia: General | Site: Groin | Laterality: Bilateral

## 2018-05-20 MED ORDER — MIRTAZAPINE 15 MG PO TABS
15.0000 mg | ORAL_TABLET | Freq: Every day | ORAL | Status: DC
  Administered 2018-05-20 – 2018-05-21 (×3): 15 mg via ORAL
  Filled 2018-05-20 (×3): qty 1

## 2018-05-20 MED ORDER — ONDANSETRON HCL 4 MG/2ML IJ SOLN
INTRAMUSCULAR | Status: AC
  Filled 2018-05-20: qty 2

## 2018-05-20 MED ORDER — FENTANYL CITRATE (PF) 100 MCG/2ML IJ SOLN
INTRAMUSCULAR | Status: DC | PRN
  Administered 2018-05-20: 100 ug via INTRAVENOUS
  Administered 2018-05-20: 50 ug via INTRAVENOUS

## 2018-05-20 MED ORDER — MORPHINE SULFATE (PF) 2 MG/ML IV SOLN
1.0000 mg | INTRAVENOUS | Status: DC | PRN
  Administered 2018-05-20: 1 mg via INTRAVENOUS
  Filled 2018-05-20: qty 1

## 2018-05-20 MED ORDER — VANCOMYCIN HCL 1000 MG IV SOLR
INTRAVENOUS | Status: DC | PRN
  Administered 2018-05-20: 1000 mg via INTRAVENOUS

## 2018-05-20 MED ORDER — LABETALOL HCL 5 MG/ML IV SOLN: 10.0000 mg | INTRAVENOUS | Status: DC | PRN

## 2018-05-20 MED ORDER — PROPOFOL 10 MG/ML IV BOLUS
INTRAVENOUS | Status: AC
  Filled 2018-05-20: qty 20

## 2018-05-20 MED ORDER — PHENYLEPHRINE 40 MCG/ML (10ML) SYRINGE FOR IV PUSH (FOR BLOOD PRESSURE SUPPORT)
PREFILLED_SYRINGE | INTRAVENOUS | Status: AC
  Filled 2018-05-20: qty 10

## 2018-05-20 MED ORDER — SODIUM CHLORIDE 0.9 % IV SOLN
250.0000 mL | INTRAVENOUS | Status: DC | PRN
  Administered 2018-05-20: 07:00:00 via INTRAVENOUS

## 2018-05-20 MED ORDER — FENTANYL CITRATE (PF) 250 MCG/5ML IJ SOLN
INTRAMUSCULAR | Status: AC
  Filled 2018-05-20: qty 5

## 2018-05-20 MED ORDER — POTASSIUM CHLORIDE CRYS ER 20 MEQ PO TBCR: 20.0000 meq | EXTENDED_RELEASE_TABLET | Freq: Once | ORAL | Status: DC

## 2018-05-20 MED ORDER — SODIUM CHLORIDE 0.9% FLUSH
3.0000 mL | Freq: Two times a day (BID) | INTRAVENOUS | Status: DC
  Administered 2018-05-20 – 2018-05-22 (×5): 3 mL via INTRAVENOUS

## 2018-05-20 MED ORDER — HYDRALAZINE HCL 20 MG/ML IJ SOLN: 5.0000 mg | INTRAMUSCULAR | Status: DC | PRN

## 2018-05-20 MED ORDER — 0.9 % SODIUM CHLORIDE (POUR BTL) OPTIME
TOPICAL | Status: DC | PRN
  Administered 2018-05-20: 1000 mL

## 2018-05-20 MED ORDER — HEPARIN SODIUM (PORCINE) 5000 UNIT/ML IJ SOLN
5000.0000 [IU] | Freq: Three times a day (TID) | INTRAMUSCULAR | Status: DC
  Administered 2018-05-20 – 2018-05-21 (×3): 5000 [IU] via SUBCUTANEOUS
  Filled 2018-05-20 (×4): qty 1

## 2018-05-20 MED ORDER — PROPOFOL 10 MG/ML IV BOLUS
INTRAVENOUS | Status: DC | PRN
  Administered 2018-05-20: 100 mg via INTRAVENOUS

## 2018-05-20 MED ORDER — PHENOL 1.4 % MT LIQD: 1.0000 | OROMUCOSAL | Status: DC | PRN

## 2018-05-20 MED ORDER — GABAPENTIN 400 MG PO CAPS
400.0000 mg | ORAL_CAPSULE | Freq: Once | ORAL | Status: AC
Start: 2018-05-20 — End: 2018-05-20
  Administered 2018-05-20: 400 mg via ORAL
  Filled 2018-05-20: qty 1

## 2018-05-20 MED ORDER — ATORVASTATIN CALCIUM 80 MG PO TABS
80.0000 mg | ORAL_TABLET | Freq: Every day | ORAL | Status: DC
  Administered 2018-05-20 – 2018-05-22 (×3): 80 mg via ORAL
  Filled 2018-05-20 (×3): qty 1

## 2018-05-20 MED ORDER — GUAIFENESIN-DM 100-10 MG/5ML PO SYRP: 15.0000 mL | ORAL_SOLUTION | ORAL | Status: DC | PRN

## 2018-05-20 MED ORDER — GABAPENTIN 400 MG PO CAPS
400.0000 mg | ORAL_CAPSULE | Freq: Three times a day (TID) | ORAL | Status: DC
  Administered 2018-05-20 – 2018-05-22 (×8): 400 mg via ORAL
  Filled 2018-05-20 (×9): qty 1

## 2018-05-20 MED ORDER — SODIUM CHLORIDE 0.9% FLUSH: 3.0000 mL | INTRAVENOUS | Status: DC | PRN

## 2018-05-20 MED ORDER — MIDAZOLAM HCL 5 MG/5ML IJ SOLN
INTRAMUSCULAR | Status: DC | PRN
  Administered 2018-05-20 (×2): 1 mg via INTRAVENOUS

## 2018-05-20 MED ORDER — ACETAMINOPHEN 325 MG RE SUPP: 325.0000 mg | RECTAL | Status: DC | PRN

## 2018-05-20 MED ORDER — MUPIROCIN 2 % EX OINT
1.0000 "application " | TOPICAL_OINTMENT | Freq: Two times a day (BID) | CUTANEOUS | Status: AC
  Administered 2018-05-20 – 2018-05-22 (×5): 1 via NASAL
  Filled 2018-05-20 (×2): qty 22

## 2018-05-20 MED ORDER — SENNOSIDES-DOCUSATE SODIUM 8.6-50 MG PO TABS: 1.0000 | ORAL_TABLET | Freq: Every evening | ORAL | Status: DC | PRN

## 2018-05-20 MED ORDER — MEPERIDINE HCL 50 MG/ML IJ SOLN: 6.2500 mg | INTRAMUSCULAR | Status: DC | PRN

## 2018-05-20 MED ORDER — LIDOCAINE 2% (20 MG/ML) 5 ML SYRINGE
INTRAMUSCULAR | Status: AC
  Filled 2018-05-20: qty 5

## 2018-05-20 MED ORDER — VANCOMYCIN HCL 500 MG IV SOLR
500.0000 mg | INTRAVENOUS | Status: DC
  Administered 2018-05-21 – 2018-05-22 (×2): 500 mg via INTRAVENOUS
  Filled 2018-05-20 (×2): qty 500

## 2018-05-20 MED ORDER — LEVOTHYROXINE SODIUM 50 MCG PO TABS
50.0000 ug | ORAL_TABLET | Freq: Every day | ORAL | Status: DC
  Administered 2018-05-20 – 2018-05-22 (×3): 50 ug via ORAL
  Filled 2018-05-20 (×3): qty 1

## 2018-05-20 MED ORDER — VANCOMYCIN HCL IN DEXTROSE 1-5 GM/200ML-% IV SOLN
INTRAVENOUS | Status: AC
  Filled 2018-05-20: qty 200

## 2018-05-20 MED ORDER — ONDANSETRON HCL 4 MG/2ML IJ SOLN: 4.0000 mg | Freq: Once | INTRAMUSCULAR | Status: DC | PRN

## 2018-05-20 MED ORDER — VANCOMYCIN HCL IN DEXTROSE 1-5 GM/200ML-% IV SOLN: 1000.0000 mg | Freq: Once | INTRAVENOUS | Status: DC

## 2018-05-20 MED ORDER — METOPROLOL TARTRATE 5 MG/5ML IV SOLN: 2.0000 mg | INTRAVENOUS | Status: DC | PRN

## 2018-05-20 MED ORDER — ASPIRIN EC 81 MG PO TBEC
81.0000 mg | DELAYED_RELEASE_TABLET | Freq: Every day | ORAL | Status: DC
  Administered 2018-05-20 – 2018-05-22 (×3): 81 mg via ORAL
  Filled 2018-05-20 (×3): qty 1

## 2018-05-20 MED ORDER — PHENYLEPHRINE HCL 10 MG/ML IJ SOLN
INTRAVENOUS | Status: DC | PRN
  Administered 2018-05-20: 30 ug/min via INTRAVENOUS

## 2018-05-20 MED ORDER — DEXAMETHASONE SODIUM PHOSPHATE 10 MG/ML IJ SOLN
INTRAMUSCULAR | Status: AC
  Filled 2018-05-20: qty 1

## 2018-05-20 MED ORDER — VANCOMYCIN HCL IN DEXTROSE 1-5 GM/200ML-% IV SOLN
1000.0000 mg | Freq: Once | INTRAVENOUS | Status: DC
  Filled 2018-05-20: qty 200

## 2018-05-20 MED ORDER — LURASIDONE HCL 20 MG PO TABS
20.0000 mg | ORAL_TABLET | Freq: Every day | ORAL | Status: DC
  Administered 2018-05-20 – 2018-05-22 (×3): 20 mg via ORAL
  Filled 2018-05-20 (×3): qty 1

## 2018-05-20 MED ORDER — HYDROMORPHONE HCL 1 MG/ML IJ SOLN
0.2500 mg | INTRAMUSCULAR | Status: DC | PRN
  Administered 2018-05-20: 0.5 mg via INTRAVENOUS

## 2018-05-20 MED ORDER — CEFAZOLIN SODIUM-DEXTROSE 1-4 GM/50ML-% IV SOLN
1.0000 g | INTRAVENOUS | Status: AC
  Filled 2018-05-20: qty 50

## 2018-05-20 MED ORDER — ROCURONIUM BROMIDE 50 MG/5ML IV SOLN
INTRAVENOUS | Status: AC
Start: 2018-05-20 — End: ?
  Filled 2018-05-20: qty 1

## 2018-05-20 MED ORDER — ONDANSETRON HCL 4 MG/2ML IJ SOLN: 4.0000 mg | Freq: Four times a day (QID) | INTRAMUSCULAR | Status: DC | PRN

## 2018-05-20 MED ORDER — CLOPIDOGREL BISULFATE 75 MG PO TABS
75.0000 mg | ORAL_TABLET | Freq: Every day | ORAL | Status: DC
  Administered 2018-05-20 – 2018-05-22 (×3): 75 mg via ORAL
  Filled 2018-05-20 (×3): qty 1

## 2018-05-20 MED ORDER — CEFAZOLIN SODIUM-DEXTROSE 1-4 GM/50ML-% IV SOLN
1.0000 g | INTRAVENOUS | Status: DC
  Filled 2018-05-20: qty 50

## 2018-05-20 MED ORDER — ALUM & MAG HYDROXIDE-SIMETH 200-200-20 MG/5ML PO SUSP
15.0000 mL | ORAL | Status: DC | PRN
  Administered 2018-05-20: 30 mL via ORAL
  Filled 2018-05-20: qty 30

## 2018-05-20 MED ORDER — PANTOPRAZOLE SODIUM 40 MG PO TBEC
40.0000 mg | DELAYED_RELEASE_TABLET | Freq: Every day | ORAL | Status: DC
  Administered 2018-05-20 – 2018-05-22 (×3): 40 mg via ORAL
  Filled 2018-05-20 (×3): qty 1

## 2018-05-20 MED ORDER — ACETAMINOPHEN 325 MG PO TABS: 325.0000 mg | ORAL_TABLET | ORAL | Status: DC | PRN

## 2018-05-20 MED ORDER — EZETIMIBE 10 MG PO TABS
10.0000 mg | ORAL_TABLET | Freq: Every day | ORAL | Status: DC
  Administered 2018-05-20 – 2018-05-22 (×3): 10 mg via ORAL
  Filled 2018-05-20 (×3): qty 1

## 2018-05-20 MED ORDER — HYDROMORPHONE HCL 1 MG/ML IJ SOLN
INTRAMUSCULAR | Status: AC
  Filled 2018-05-20: qty 1

## 2018-05-20 MED ORDER — CEFAZOLIN SODIUM-DEXTROSE 2-3 GM-%(50ML) IV SOLR
INTRAVENOUS | Status: DC | PRN
  Administered 2018-05-20: 2 g via INTRAVENOUS

## 2018-05-20 MED ORDER — MIDAZOLAM HCL 2 MG/2ML IJ SOLN
INTRAMUSCULAR | Status: AC
  Filled 2018-05-20: qty 2

## 2018-05-20 MED ORDER — OXYCODONE-ACETAMINOPHEN 5-325 MG PO TABS
1.0000 | ORAL_TABLET | Freq: Four times a day (QID) | ORAL | Status: DC | PRN
  Administered 2018-05-20 – 2018-05-22 (×6): 1 via ORAL
  Filled 2018-05-20 (×6): qty 1

## 2018-05-20 MED ORDER — SUCCINYLCHOLINE CHLORIDE 20 MG/ML IJ SOLN
INTRAMUSCULAR | Status: AC
  Filled 2018-05-20: qty 1

## 2018-05-20 MED ORDER — LIDOCAINE HCL (CARDIAC) PF 100 MG/5ML IV SOSY
PREFILLED_SYRINGE | INTRAVENOUS | Status: DC | PRN
  Administered 2018-05-20: 100 mg via INTRAVENOUS

## 2018-05-20 SURGICAL SUPPLY — 44 items
BANDAGE ESMARK 6X9 LF (GAUZE/BANDAGES/DRESSINGS) IMPLANT
BNDG ESMARK 6X9 LF (GAUZE/BANDAGES/DRESSINGS)
CANISTER SUCT 3000ML PPV (MISCELLANEOUS) ×2 IMPLANT
CANNULA VESSEL 3MM 2 BLNT TIP (CANNULA) ×2 IMPLANT
CLIP LIGATING EXTRA MED SLVR (CLIP) ×2 IMPLANT
CLIP LIGATING EXTRA SM BLUE (MISCELLANEOUS) ×2 IMPLANT
CUFF TOURNIQUET SINGLE 34IN LL (TOURNIQUET CUFF) IMPLANT
CUFF TOURNIQUET SINGLE 44IN (TOURNIQUET CUFF) IMPLANT
DERMABOND ADVANCED (GAUZE/BANDAGES/DRESSINGS) ×1
DERMABOND ADVANCED .7 DNX12 (GAUZE/BANDAGES/DRESSINGS) ×1 IMPLANT
DRAIN SNY 10X20 3/4 PERF (WOUND CARE) IMPLANT
DRAPE X-RAY CASS 24X20 (DRAPES) IMPLANT
ELECT REM PT RETURN 9FT ADLT (ELECTROSURGICAL) ×2
ELECTRODE REM PT RTRN 9FT ADLT (ELECTROSURGICAL) ×1 IMPLANT
EVACUATOR SILICONE 100CC (DRAIN) IMPLANT
GAUZE SPONGE 4X4 12PLY STRL (GAUZE/BANDAGES/DRESSINGS) ×4 IMPLANT
GLOVE SS BIOGEL STRL SZ 7.5 (GLOVE) ×1 IMPLANT
GLOVE SUPERSENSE BIOGEL SZ 7.5 (GLOVE) ×1
GOWN STRL REUS W/ TWL LRG LVL3 (GOWN DISPOSABLE) ×3 IMPLANT
GOWN STRL REUS W/TWL LRG LVL3 (GOWN DISPOSABLE) ×3
INSERT FOGARTY SM (MISCELLANEOUS) IMPLANT
KIT BASIN OR (CUSTOM PROCEDURE TRAY) ×2 IMPLANT
KIT TURNOVER KIT B (KITS) ×2 IMPLANT
NS IRRIG 1000ML POUR BTL (IV SOLUTION) ×4 IMPLANT
PACK PERIPHERAL VASCULAR (CUSTOM PROCEDURE TRAY) ×2 IMPLANT
PAD ABD 8X10 STRL (GAUZE/BANDAGES/DRESSINGS) ×4 IMPLANT
PAD ARMBOARD 7.5X6 YLW CONV (MISCELLANEOUS) ×4 IMPLANT
PADDING CAST COTTON 6X4 STRL (CAST SUPPLIES) IMPLANT
SET COLLECT BLD 21X3/4 12 (NEEDLE) IMPLANT
STAPLER VISISTAT 35W (STAPLE) IMPLANT
STOPCOCK 4 WAY LG BORE MALE ST (IV SETS) IMPLANT
SUT ETHILON 3 0 PS 1 (SUTURE) IMPLANT
SUT PROLENE 5 0 C 1 24 (SUTURE) ×2 IMPLANT
SUT PROLENE 6 0 CC (SUTURE) ×2 IMPLANT
SUT SILK 2 0 SH (SUTURE) ×2 IMPLANT
SUT VIC AB 2-0 CTX 36 (SUTURE) ×4 IMPLANT
SUT VIC AB 3-0 SH 27 (SUTURE) ×2
SUT VIC AB 3-0 SH 27X BRD (SUTURE) ×2 IMPLANT
TAPE CLOTH SURG 4X10 WHT LF (GAUZE/BANDAGES/DRESSINGS) ×4 IMPLANT
TOWEL GREEN STERILE (TOWEL DISPOSABLE) ×2 IMPLANT
TRAY FOLEY MTR SLVR 16FR STAT (SET/KITS/TRAYS/PACK) ×2 IMPLANT
TUBING EXTENTION W/L.L. (IV SETS) IMPLANT
UNDERPAD 30X30 (UNDERPADS AND DIAPERS) ×2 IMPLANT
WATER STERILE IRR 1000ML POUR (IV SOLUTION) ×2 IMPLANT

## 2018-05-20 NOTE — Progress Notes (Signed)
Pharmacy Antibiotic Note  Alison Lynch is a 57 y.o. female admitted on 05/19/2018 with possible aortobifemoral bypass infection.  Pharmacy has been consulted for vancomycin dosing. Afebrile, BP wnl, CKD w/ Scr 1.88 (near baseline). Surgical PCR screen positive for MRSA.  Plan: Vancomycin 1000mg  x1 (loading dose) Vancomycin 500mg  Q24hrs to start 6/23 Follow cultures, clinical status, LOT, renal function Monitor vancomycin troughs as clinically indicated  Height: 5' (152.4 cm) Weight: 110 lb 7.2 oz (50.1 kg) IBW/kg (Calculated) : 45.5 (dosing weight 50 kg >>TBW)  Temp (24hrs), Avg:97.7 F (36.5 C), Min:96.6 F (35.9 C), Max:98.3 F (36.8 C)  Recent Labs  Lab 05/20/18 0101  WBC 7.4  CREATININE 1.88*    Estimated Creatinine Clearance: 24.3 mL/min (A) (by C-G formula based on SCr of 1.88 mg/dL (H)).    Allergies  Allergen Reactions  . Clindamycin/Lincomycin Rash    Antimicrobials this admission: Vancomycin 6/22>>  Microbiology results: Surgical PCR positive for MRSA on 6/22   Nolen MuAustin J Rasul Decola PharmD PGY1 Acute Care Pharmacy Resident 05/20/2018 9:23 AM Phone: [phone] until 3:30 then check AMION

## 2018-05-20 NOTE — Anesthesia Postprocedure Evaluation (Signed)
Anesthesia Post Note  Patient: Milton Fergusonammy Malatesta  Procedure(s) Performed: FEMORAL EXPLORATION, RIGHT (Bilateral Groin)     Patient location during evaluation: PACU Anesthesia Type: General Level of consciousness: awake and alert Pain management: pain level controlled Vital Signs Assessment: post-procedure vital signs reviewed and stable Respiratory status: spontaneous breathing, nonlabored ventilation, respiratory function stable and patient connected to nasal cannula oxygen Cardiovascular status: blood pressure returned to baseline and stable Postop Assessment: no apparent nausea or vomiting Anesthetic complications: no    Last Vitals:  Vitals:   05/20/18 0914 05/20/18 1226  BP: 139/84 (!) 108/59  Pulse: 91 91  Resp: (!) 28 20  Temp: (!) 35.9 C 36.6 C  SpO2: 97%     Last Pain:  Vitals:   05/20/18 1226  TempSrc: Oral  PainSc:                  Devyn Sheerin DAVID

## 2018-05-20 NOTE — Anesthesia Procedure Notes (Signed)
Procedure Name: LMA Insertion Date/Time: 05/20/2018 7:42 AM Performed by: Coralee RudFlores, Soloman Mckeithan, CRNA Pre-anesthesia Checklist: Patient identified, Emergency Drugs available, Suction available and Patient being monitored Patient Re-evaluated:Patient Re-evaluated prior to induction Oxygen Delivery Method: Circle system utilized Preoxygenation: Pre-oxygenation with 100% oxygen Induction Type: IV induction Ventilation: Mask ventilation without difficulty LMA: LMA inserted LMA Size: 3.0 Number of attempts: 1 Placement Confirmation: positive ETCO2 Tube secured with: Tape Dental Injury: Teeth and Oropharynx as per pre-operative assessment

## 2018-05-20 NOTE — Interval H&P Note (Signed)
History and Physical Interval Note:  05/20/2018 7:04 AM  Alison Lynch  has presented today for surgery, with the diagnosis of RIGHT GROIN INFECTION  The various methods of treatment have been discussed with the patient and family. After consideration of risks, benefits and other options for treatment, the patient has consented to  Procedure(s): FEMORAL EXPLORATION, RIGHT (Right) as a surgical intervention .  The patient's history has been reviewed, patient examined, no change in status, stable for surgery.  I have reviewed the patient's chart and labs.  Questions were answered to the patient's satisfaction.     Gretta Beganodd Javante Nilsson

## 2018-05-20 NOTE — Op Note (Signed)
    OPERATIVE REPORT  DATE OF SURGERY: 05/20/2018  PATIENT: Alison Lynch, [age] y.o. female MRN: [id number]  DOB: 01/11/1961  PRE-OPERATIVE DIAGNOSIS: Bilateral groin wound operation right greater than left  POST-OPERATIVE DIAGNOSIS:  Same  PROCEDURE: Debridement of bilateral groin wounds  SURGEON:  Gretta Beganodd Steed Kanaan, M.D.  PHYSICIAN ASSISTANT: Nurse  ANESTHESIA: LMA  EBL: Minimal ml  No intake/output data recorded.  BLOOD ADMINISTERED: None  DRAINS: None  SPECIMEN: Aerobic and anaerobic cultures of right groin  COUNTS CORRECT:  YES  PLAN OF CARE: PACU  PATIENT DISPOSITION:  PACU - hemodynamically stable  PROCEDURE DETAILS: Patient is status post bilateral femoral exploration and thrombectomy of aortobifemoral bypass 1 month ago.  [facility] with the drainage from both groins right greater than left and [facility]  It does appear to be superficial and she is taken to the operating room this morning for debridement.  The patient had what appeared to be a Vicryl stitch abscess that extended the entire length of the right groin and small area left groin.  Cultures were taken.  Wounds packed and transferred to PACU   Larina Earthlyodd F. Gerrett Loman, M.D., [facility] 05/20/2018 8:19 AM

## 2018-05-20 NOTE — Anesthesia Preprocedure Evaluation (Signed)
Anesthesia Evaluation  Patient identified by MRN, date of birth, ID band Patient awake    Reviewed: Allergy & Precautions, NPO status , Patient's Chart, lab work & pertinent test results  Airway Mallampati: I  TM Distance: >3 FB Neck ROM: Full    Dental   Pulmonary former smoker,    Pulmonary exam normal        Cardiovascular hypertension, Pt. on medications Normal cardiovascular exam     Neuro/Psych Anxiety Depression    GI/Hepatic   Endo/Other    Renal/GU Renal InsufficiencyRenal disease     Musculoskeletal   Abdominal   Peds  Hematology   Anesthesia Other Findings   Reproductive/Obstetrics                             Anesthesia Physical Anesthesia Plan  ASA: III  Anesthesia Plan: General   Post-op Pain Management:    Induction: Intravenous  PONV Risk Score and Plan: 3 and Ondansetron, Midazolam and Treatment may vary due to age or medical condition  Airway Management Planned: LMA  Additional Equipment:   Intra-op Plan:   Post-operative Plan: Extubation in OR  Informed Consent: I have reviewed the patients History and Physical, chart, labs and discussed the procedure including the risks, benefits and alternatives for the proposed anesthesia with the patient or authorized representative who has indicated his/her understanding and acceptance.     Plan Discussed with: CRNA and Surgeon  Anesthesia Plan Comments:         Anesthesia Quick Evaluation

## 2018-05-20 NOTE — Plan of Care (Signed)
  Problem: Education: Goal: Knowledge of General Education information will improve Outcome: Progressing   Problem: Health Behavior/Discharge Planning: Goal: Ability to manage health-related needs will improve Outcome: Progressing   Problem: Activity: Goal: Risk for activity intolerance will decrease Outcome: Progressing   Problem: Nutrition: Goal: Adequate nutrition will be maintained Outcome: Progressing   Problem: Coping: Goal: Level of anxiety will decrease Outcome: Progressing   Problem: Clinical Measurements: Goal: Will remain free from infection Outcome: Not Progressing

## 2018-05-20 NOTE — Transfer of Care (Addendum)
Immediate Anesthesia Transfer of Care Note  Patient: Alison Lynch  Procedure(s) Performed: FEMORAL EXPLORATION, RIGHT (Bilateral Groin)  Patient Location: PACU  Anesthesia Type:General  Level of Consciousness: awake, alert , oriented and patient cooperative  Airway & Oxygen Therapy: Patient Spontanous Breathing and Patient connected to nasal cannula oxygen  Post-op Assessment: Report given to RN, Post -op Vital signs reviewed and stable and Patient moving all extremities  Post vital signs: Reviewed and stable  Last Vitals:  Vitals Value Taken Time  BP 129/74 05/20/2018  8:28 AM  Temp    Pulse 86 05/20/2018  8:29 AM  Resp 16 05/20/2018  8:29 AM  SpO2 99 % 05/20/2018  8:29 AM  Vitals shown include unvalidated device data.  Last Pain:  Vitals:   05/20/18 0510  TempSrc: Oral  PainSc:       Patients Stated Pain Goal: 2 (05/20/18 0139)  Complications: No apparent anesthesia complications

## 2018-05-21 ENCOUNTER — Encounter (HOSPITAL_COMMUNITY): Payer: Self-pay | Admitting: Vascular Surgery

## 2018-05-21 MED ORDER — WARFARIN - PHARMACIST DOSING INPATIENT
Freq: Every day | Status: DC
  Administered 2018-05-22: 18:00:00

## 2018-05-21 MED ORDER — WARFARIN SODIUM 2.5 MG PO TABS
2.5000 mg | ORAL_TABLET | Freq: Every day | ORAL | Status: DC
  Administered 2018-05-21 – 2018-05-22 (×2): 2.5 mg via ORAL
  Filled 2018-05-21 (×2): qty 1

## 2018-05-21 NOTE — Progress Notes (Signed)
Bruising noted at heparin injection sites. Area is tender to patient but soft to palpation. Will continue to monitor and rotate sites. Victorino DecemberGarnet A Cherissa Hook, RN

## 2018-05-21 NOTE — Progress Notes (Signed)
Subjective: Interval History: none.. Comfortable overall  Objective: Vital signs in last 24 hours: Temp:  [96.6 F (35.9 C)-98.4 F (36.9 C)] 97.7 F (36.5 C) (06/23 0424) Pulse Rate:  [80-96] 80 (06/23 0424) Resp:  [15-28] 15 (06/23 0424) BP: (101-139)/(52-84) 101/52 (06/23 0424) SpO2:  [97 %-100 %] 100 % (06/23 0424)  Intake/Output from previous day: 06/22 0701 - 06/23 0700 In: 583 [P.O.:130; I.V.:453] Out: 1900 [Urine:1900] Intake/Output this shift: No intake/output data recorded.  Groin wound with no surrounding erythema.  Good granulation tissue at the base  Lab Results: Recent Labs    05/20/18 0101  WBC 7.4  HGB 12.0  HCT 37.7  PLT 308   BMET Recent Labs    05/20/18 0101  NA 142  K 4.2  CL 108  CO2 23  GLUCOSE 253*  BUN 12  CREATININE 1.88*  CALCIUM 8.9    Studies/Results: No results found. Anti-infectives: Anti-infectives (From admission, onward)   Start     Dose/Rate Route Frequency Ordered Stop   05/21/18 1000  vancomycin (VANCOCIN) 500 mg in sodium chloride 0.9 % 100 mL IVPB     500 mg 100 mL/hr over 60 Minutes Intravenous Every 24 hours 05/20/18 0917     05/20/18 0930  vancomycin (VANCOCIN) IVPB 1000 mg/200 mL premix     1,000 mg 200 mL/hr over 60 Minutes Intravenous  Once 05/20/18 0917     05/20/18 0715  vancomycin (VANCOCIN) IVPB 1000 mg/200 mL premix  Status:  Discontinued     1,000 mg 200 mL/hr over 60 Minutes Intravenous  Once 05/20/18 0704 05/20/18 0905   05/20/18 0645  vancomycin (VANCOCIN) 1-5 GM/200ML-% IVPB    Note to Pharmacy:  Shireen Quanodd, Robert   : cabinet override      05/20/18 0645 05/20/18 1859   05/20/18 0600  ceFAZolin (ANCEF) IVPB 1 g/50 mL premix    Note to Pharmacy:  Send with pt to OR   1 g 100 mL/hr over 30 Minutes Intravenous To Short Stay 05/20/18 0031 05/21/18 0600   05/20/18 0030  ceFAZolin (ANCEF) IVPB 1 g/50 mL premix  Status:  Discontinued    Note to Pharmacy:  Send with pt to OR   1 g 100 mL/hr over 30 Minutes  Intravenous On call 05/20/18 0028 05/20/18 0031      Assessment/Plan: s/p Procedure(s): FEMORAL EXPLORATION, RIGHT (Bilateral) Remains afebrile.  Continue local wound care to the groins.  Intraoperative cultures pending.  Plan discharge tomorrow after cultures returned.  Continue vancomycin for now   LOS: 2 days   Alison Lynch 05/21/2018, 8:32 AM

## 2018-05-22 LAB — PROTIME-INR
INR: 1.41
Prothrombin Time: 17.1 seconds — ABNORMAL HIGH (ref 11.4–15.2)

## 2018-05-22 MED ORDER — HYDROCODONE-ACETAMINOPHEN 5-325 MG PO TABS: 1.0000 | ORAL_TABLET | Freq: Four times a day (QID) | ORAL | 0 refills | Status: DC | PRN

## 2018-05-22 MED ORDER — CIPROFLOXACIN HCL 500 MG PO TABS: 500.0000 mg | ORAL_TABLET | Freq: Two times a day (BID) | ORAL | 0 refills | Status: AC

## 2018-05-22 NOTE — Care Management Note (Signed)
Case Management Note Donn PieriniKristi Taylan Marez RN, BSN Unit 4E-Case Manager [phone]  Patient Details  Name: Alison Fergusonammy Stickle MRN: [id number] Date of Birth: 02/25/1961  Subjective/Objective:    Pt admitted with hx of ABF bypass s/p debridement of bil. groins               Action/Plan: PTA Pt lived at home with spouse- has needed DME at home. CM to follow for transition of care needs  Expected Discharge Date:                  Expected Discharge Plan:  Home w Home Health Services  In-House Referral:  NA  Discharge planning Services  CM Consult  Post Acute Care Choice:    Choice offered to:     DME Arranged:    DME Agency:     HH Arranged:    HH Agency:     Status of Service:  In process, will continue to follow  If discussed at Long Length of Stay Meetings, dates discussed:    Discharge Disposition:   Additional Comments:  Darrold SpanWebster, [place], RN 05/22/2018, 10:35 AM

## 2018-05-22 NOTE — Progress Notes (Signed)
05/22/2018 7:38 PM Discharge AVS meds taken today and those due this evening reviewed.  Follow-up appointments and when to call md reviewed.  D/C IV and TELE.  Questions and concerns addressed.   D/C home per orders. Kathryne HitchAllen, Keymani Mclean C

## 2018-05-22 NOTE — Progress Notes (Signed)
ANTICOAGULATION CONSULT NOTE   Pharmacy Consult for warfarin  Indication: DVT  Allergies  Allergen Reactions  . Clindamycin/Lincomycin Rash    Patient Measurements: Height: 5' (152.4 cm) Weight: 110 lb 7.2 oz (50.1 kg) IBW/kg (Calculated) : 45.5  Vital Signs: Temp: 97.8 F (36.6 C) (06/24 0513) Temp Source: Oral (06/24 0513) BP: 97/54 (06/24 0513) Pulse Rate: 63 (06/24 0513)  Labs: Recent Labs    05/20/18 0101 05/22/18 0323  HGB 12.0  --   HCT 37.7  --   PLT 308  --   LABPROT 27.2* 17.1*  INR 2.55 1.41  CREATININE 1.88*  --     Estimated Creatinine Clearance: 24.3 mL/min (A) (by C-G formula based on SCr of 1.88 mg/dL (H)).   Medical History: Past Medical History:  Diagnosis Date  . Cervical myelopathy (HCC)   . CKD (chronic kidney disease), stage III (HCC)   . 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 Depression   . Dysphagia   . Hx of migraine headaches   . Hyperlipidemia   . Hypothyroid   . Leriche syndrome (HCC) 12/2016   Chronically occluded aorta  . Osteoporosis   . PAD (peripheral artery disease) (HCC)    Status post left AKA for extensive thromboembolism, right leg has in-line flow through popliteal artery only.  Bilateral iliac stents occluded  . Panic attacks   . PTSD (post-traumatic stress disorder)     Medications:  Medications Prior to Admission  Medication Sig Dispense Refill Last Dose  . aspirin EC 81 MG tablet Take 81 mg by mouth daily.   05/19/2018  . atorvastatin (LIPITOR) 80 MG tablet Take 1 tablet (80 mg total) by mouth daily. 30 tablet 0 05/21/2018 at Unknown time  . clopidogrel (PLAVIX) 75 MG tablet Take 75 mg by mouth daily.   05/19/2018  . ezetimibe (ZETIA) 10 MG tablet Take 10 mg by mouth daily.   05/19/2018  . gabapentin (NEURONTIN) 400 MG capsule Take 1,200 mg by mouth See admin instructions. 400mg  in the morning, and  800mg  at bedtime   05/21/2018 at Unknown time  . levothyroxine (SYNTHROID, LEVOTHROID) 50 MCG tablet Take 50 mcg by mouth daily before breakfast.   05/19/2018  . lurasidone (LATUDA) 20 MG TABS tablet Take 20 mg by mouth daily.    05/19/2018  . mirtazapine (REMERON) 15 MG tablet Take 15 mg by mouth at bedtime.   05/19/2018  . oxyCODONE-acetaminophen (PERCOCET/ROXICET) 5-325 MG tablet Take 1 tablet by mouth every 6 (six) hours as needed for moderate pain. 30 tablet 0 unknown  . warfarin (COUMADIN) 5 MG tablet Take 0.5 tablets (2.5 mg total) by mouth daily. (Patient taking differently: Take 5 mg by mouth daily. ) 30 tablet 2 05/19/2018    Assessment: 57 YOF with history of extensive left leg thromboembolism s/p left AK in May 208 on warfarin prior to admission. Now s/p  Debridement of bilateral groin wounds. Resumed on warfarin yesterday. INR dropped from 2.55 to 1.41 today likely due to missed doses prior to admission. Also on SQ heparin   Goal of Therapy:  INR 2-3 Monitor platelets by anticoagulation protocol: Yes   Plan:  -Continue home warfarin dose of 2.5 mg daily -If planning discharge, consider arranging f/u INR check later this week.   Vinnie LevelBenjamin Annarae Macnair, PharmD., BCPS Clinical Pharmacist Clinical phone for 05/22/18 until 3:30pm: [phone] If after 3:30pm, please call main pharmacy  at: [phone]

## 2018-05-22 NOTE — Progress Notes (Addendum)
  Progress Note    05/22/2018 7:28 AM 2 Days Post-Op  Subjective:  No new complaints this morning; anticipating discharge today   Vitals:   05/21/18 2015 05/22/18 0513  BP: 92/73 (!) 97/54  Pulse: 93 63  Resp: 20 16  Temp: 98 F (36.7 C) 97.8 F (36.6 C)  SpO2: 100% 100%   Physical Exam: Lungs:  Non labored on RA Incisions:  R groin incision with clean wound bed, granulating some, no obvious infection; L groin with two open areas without surrounding erythema or purulent drainage Extremities:  R foot warm to touch with good capillary refill; L stump without active tissue ischemia Abdomen:  Soft, non tender Neurologic: A&O  CBC    Component Value Date/Time   WBC 7.4 05/20/2018 0101   RBC 4.15 05/20/2018 0101   HGB 12.0 05/20/2018 0101   HCT 37.7 05/20/2018 0101   PLT 308 05/20/2018 0101   MCV 90.8 05/20/2018 0101   MCH 28.9 05/20/2018 0101   MCHC 31.8 05/20/2018 0101   RDW 13.0 05/20/2018 0101   LYMPHSABS 0.4 (L) 04/19/2018 0802   MONOABS 0.4 04/19/2018 0802   EOSABS 0.0 04/19/2018 0802   BASOSABS 0.1 04/19/2018 0802    BMET    Component Value Date/Time   NA 142 05/20/2018 0101   NA 144 12/06/2017 1517   K 4.2 05/20/2018 0101   CL 108 05/20/2018 0101   CO2 23 05/20/2018 0101   GLUCOSE 253 (H) 05/20/2018 0101   BUN 12 05/20/2018 0101   BUN 17 12/06/2017 1517   CREATININE 1.88 (H) 05/20/2018 0101   CALCIUM 8.9 05/20/2018 0101   GFRNONAA 29 (L) 05/20/2018 0101   GFRAA 34 (L) 05/20/2018 0101    INR    Component Value Date/Time   INR 1.41 05/22/2018 0323     Intake/Output Summary (Last 24 hours) at 05/22/2018 16100728 Last data filed at 05/22/2018 0000 Gross per 24 hour  Intake 273 ml  Output 375 ml  Net -102 ml     Assessment/Plan:  57 y.o. female is s/p debridement of bilateral groins 2 Days Post-Op with history of ABF bypass  Cultures/sensitivites still pending Pharmacy dosing coumadin; INR 1.41 Continue wet to dry dressing changes B  groins Discharge later today if sensitivities completed    Emilie RutterMatthew Eveland, PA-C [facility] [phone] 05/22/2018 7:28 AM   I have examined the patient, reviewed and agree with above.  Groin wound stable.  Clean with good granulating base and no erythema.  Culture showed staph aureus with sensitivities pending.  Will discharge on oral Cipro.  Change pending final culture result.  We will follow-up with Dr. Randie Heinzain in 2 to 3 weeks  Gretta Beganodd Miara Emminger, MD 05/22/2018 5:18 PM

## 2018-05-24 ENCOUNTER — Telehealth: Payer: Self-pay | Admitting: Vascular Surgery

## 2018-05-24 NOTE — Telephone Encounter (Signed)
sch appt spk to pt husbands 06/09/18 12pm p/o MD

## 2018-05-25 LAB — AEROBIC/ANAEROBIC CULTURE (SURGICAL/DEEP WOUND)

## 2018-05-25 LAB — AEROBIC/ANAEROBIC CULTURE W GRAM STAIN (SURGICAL/DEEP WOUND)

## 2018-05-25 NOTE — Discharge Summary (Signed)
Physician Discharge Summary   Patient ID: Alison Lynch [id number] 57 y.o. 03/28/1961  Admit date: 05/19/2018  Discharge date and time: 05/22/2018  7:44 PM   Admitting Physician: Larina Earthlyodd F Early, MD   Discharge Physician: same  Admission Diagnoses: RIGHT GROIN INFECTION  Discharge Diagnoses: same  Admission Condition: fair  Discharged Condition: fair  Indication for Admission: surgical wound infection R greater than L  Hospital Course: Mr. Alison Lynch is a 57 year old female admitted on 05/19/2018 due to nonhealing right greater than left groin surgical wound.  She was started on broad-spectrum antibiotics IV.  She is status post aortobifemoral bypass and she underwent subsequent bilateral femoral thrombectomies due to occlusion of ABF bypass.  She was brought to the operating room on 05/20/2018 by Dr. early for debridement of bilateral groin wounds.  Most of her hospital stay consisted of IV antibiotic therapy as well as wound care.  On POD #2 patient was feeling fit for discharge home.  Based on culture and sensitivity patient was discharged on 500 mg of Cipro twice daily.  She will resume her home dose of Coumadin.  She will also have home health arranged to assist with complex wound care involving daily cleansing of bilateral groin wounds as well as wet-to-dry dressing changes daily and as needed.   She will follow-up in office in about 2 weeks for wound check with Dr. Randie Heinzain.  Discharge instructions were reviewed with the patient and she voices her understanding.  She will be discharged in stable condition.  It should also be noted that she will be prescribed 1 to 2 days of narcotic pain medication for continued postoperative pain control.  Consults: None  Treatments: surgery: debridement of bilateral groin wounds by Dr. Arbie CookeyEarly 05/20/18  Discharge Exam: see progress note 05/20/18 Vitals:   05/22/18 0513 05/22/18 1225  BP: (!) 97/54 (!) 102/55  Pulse: 63 78  Resp: 16 19  Temp: 97.8 F (36.6 C)  97.8 F (36.6 C)  SpO2: 100% 98%     [facility]  Patient Instructions:  Allergies as of 05/22/2018      Reactions   Clindamycin/lincomycin Rash      Medication List    TAKE these medications   aspirin EC 81 MG tablet Take 81 mg by mouth daily.   atorvastatin 80 MG tablet Commonly known as:  LIPITOR Take 1 tablet (80 mg total) by mouth daily.   ciprofloxacin 500 MG tablet Commonly known as:  CIPRO Take 1 tablet (500 mg total) by mouth 2 (two) times daily for 7 days.   clopidogrel 75 MG tablet Commonly known as:  PLAVIX Take 75 mg by mouth daily.   ezetimibe 10 MG tablet Commonly known as:  ZETIA Take 10 mg by mouth daily.   gabapentin 400 MG capsule Commonly known as:  NEURONTIN Take 1,200 mg by mouth See admin instructions. 400mg  in the morning, and 800mg  at bedtime   HYDROcodone-acetaminophen 5-325 MG tablet Commonly known as:  NORCO Take 1 tablet by mouth every 6 (six) hours as needed for moderate pain.   LATUDA 20 MG Tabs tablet Generic drug:  lurasidone Take 20 mg by mouth daily.   levothyroxine 50 MCG tablet Commonly known as:  SYNTHROID, LEVOTHROID Take 50 mcg by mouth daily before breakfast.   mirtazapine 15 MG tablet Commonly known as:  REMERON Take 15 mg by mouth at bedtime.   oxyCODONE-acetaminophen 5-325 MG tablet Commonly known as:  PERCOCET/ROXICET Take 1 tablet by mouth every 6 (six) hours as  needed for moderate pain.   warfarin 5 MG tablet Commonly known as:  COUMADIN Take 0.5 tablets (2.5 mg total) by mouth daily. What changed:  how much to take      Activity: activity as tolerated Diet: regular diet Wound Care: wet to dry  Follow-up with Dr. Randie Heinz in 2 weeks.  SignedEmilie Rutter 05/25/2018 9:01 AM

## 2018-06-07 DIAGNOSIS — Z89612 Acquired absence of left leg above knee: Secondary | ICD-10-CM | POA: Diagnosis not present

## 2018-06-09 ENCOUNTER — Encounter: Payer: Self-pay | Admitting: Vascular Surgery

## 2018-06-09 ENCOUNTER — Ambulatory Visit (INDEPENDENT_AMBULATORY_CARE_PROVIDER_SITE_OTHER): Payer: Self-pay | Admitting: Vascular Surgery

## 2018-06-09 ENCOUNTER — Other Ambulatory Visit: Payer: Self-pay

## 2018-06-09 VITALS — BP 113/66 | HR 89 | Temp 97.0°F | Resp 16 | Ht 60.0 in | Wt 110.0 lb

## 2018-06-09 DIAGNOSIS — I739 Peripheral vascular disease, unspecified: Secondary | ICD-10-CM

## 2018-06-09 NOTE — Progress Notes (Signed)
  Subjective:     Patient ID: Alison Lynch, female   DOB: 08/26/1961, 57 y.o.   MRN: [id number]  HPI 57 year old female follows up from recent debridement right groin wound.  This was after having bilateral aortobifemoral embolectomy.  She now has a prosthetic on her left lower extremity from her previous above-knee amputation.  Her right foot is doing well.  Her wound is nearly healed on the right groin.   Review of Systems No issues today    Objective:   Physical Exam Awake alert oriented Nonlabored respirations Right groin wound is superficial at this point    Assessment/Plan     Alison Lynch follows up from recent groin wound debridement.  It is nearly healed this time.  She can keep her regular scheduled follow-up in September and will get ABIs at that time 2.  She remains on Coumadin.  I am happy to see that she now has her prosthetic hopefully will be walking soon.  Brandon C. Randie Heinzain, MD [facility] [place] Office: [phone] Pager: [phone]

## 2018-06-21 DIAGNOSIS — E039 Hypothyroidism, unspecified: Secondary | ICD-10-CM | POA: Diagnosis not present

## 2018-06-21 DIAGNOSIS — R69 Illness, unspecified: Secondary | ICD-10-CM | POA: Diagnosis not present

## 2018-06-21 DIAGNOSIS — I739 Peripheral vascular disease, unspecified: Secondary | ICD-10-CM | POA: Diagnosis not present

## 2018-06-21 DIAGNOSIS — E785 Hyperlipidemia, unspecified: Secondary | ICD-10-CM | POA: Diagnosis not present

## 2018-06-21 DIAGNOSIS — Z7901 Long term (current) use of anticoagulants: Secondary | ICD-10-CM | POA: Diagnosis not present

## 2018-06-21 DIAGNOSIS — R7303 Prediabetes: Secondary | ICD-10-CM | POA: Diagnosis not present

## 2018-06-28 DIAGNOSIS — Z7901 Long term (current) use of anticoagulants: Secondary | ICD-10-CM | POA: Diagnosis not present

## 2018-07-06 DIAGNOSIS — R2681 Unsteadiness on feet: Secondary | ICD-10-CM | POA: Diagnosis not present

## 2018-07-06 DIAGNOSIS — Z89612 Acquired absence of left leg above knee: Secondary | ICD-10-CM | POA: Diagnosis not present

## 2018-07-06 DIAGNOSIS — R262 Difficulty in walking, not elsewhere classified: Secondary | ICD-10-CM | POA: Diagnosis not present

## 2018-07-06 DIAGNOSIS — M25652 Stiffness of left hip, not elsewhere classified: Secondary | ICD-10-CM | POA: Diagnosis not present

## 2018-07-06 DIAGNOSIS — M6281 Muscle weakness (generalized): Secondary | ICD-10-CM | POA: Diagnosis not present

## 2018-07-06 DIAGNOSIS — M25661 Stiffness of right knee, not elsewhere classified: Secondary | ICD-10-CM | POA: Diagnosis not present

## 2018-07-06 DIAGNOSIS — G546 Phantom limb syndrome with pain: Secondary | ICD-10-CM | POA: Diagnosis not present

## 2018-07-07 ENCOUNTER — Encounter (HOSPITAL_COMMUNITY): Payer: Medicare HMO

## 2018-07-07 ENCOUNTER — Ambulatory Visit: Payer: Medicare HMO | Admitting: Family

## 2018-07-11 DIAGNOSIS — G546 Phantom limb syndrome with pain: Secondary | ICD-10-CM | POA: Diagnosis not present

## 2018-07-11 DIAGNOSIS — R2681 Unsteadiness on feet: Secondary | ICD-10-CM | POA: Diagnosis not present

## 2018-07-11 DIAGNOSIS — M6281 Muscle weakness (generalized): Secondary | ICD-10-CM | POA: Diagnosis not present

## 2018-07-11 DIAGNOSIS — M25652 Stiffness of left hip, not elsewhere classified: Secondary | ICD-10-CM | POA: Diagnosis not present

## 2018-07-11 DIAGNOSIS — Z89612 Acquired absence of left leg above knee: Secondary | ICD-10-CM | POA: Diagnosis not present

## 2018-07-11 DIAGNOSIS — M25661 Stiffness of right knee, not elsewhere classified: Secondary | ICD-10-CM | POA: Diagnosis not present

## 2018-07-11 DIAGNOSIS — R262 Difficulty in walking, not elsewhere classified: Secondary | ICD-10-CM | POA: Diagnosis not present

## 2018-07-18 DIAGNOSIS — Z89612 Acquired absence of left leg above knee: Secondary | ICD-10-CM | POA: Diagnosis not present

## 2018-07-18 DIAGNOSIS — R2681 Unsteadiness on feet: Secondary | ICD-10-CM | POA: Diagnosis not present

## 2018-07-18 DIAGNOSIS — M25661 Stiffness of right knee, not elsewhere classified: Secondary | ICD-10-CM | POA: Diagnosis not present

## 2018-07-18 DIAGNOSIS — R262 Difficulty in walking, not elsewhere classified: Secondary | ICD-10-CM | POA: Diagnosis not present

## 2018-07-18 DIAGNOSIS — M6281 Muscle weakness (generalized): Secondary | ICD-10-CM | POA: Diagnosis not present

## 2018-07-18 DIAGNOSIS — M25652 Stiffness of left hip, not elsewhere classified: Secondary | ICD-10-CM | POA: Diagnosis not present

## 2018-07-18 DIAGNOSIS — G546 Phantom limb syndrome with pain: Secondary | ICD-10-CM | POA: Diagnosis not present

## 2018-07-21 DIAGNOSIS — Z89612 Acquired absence of left leg above knee: Secondary | ICD-10-CM | POA: Diagnosis not present

## 2018-07-21 DIAGNOSIS — R262 Difficulty in walking, not elsewhere classified: Secondary | ICD-10-CM | POA: Diagnosis not present

## 2018-07-21 DIAGNOSIS — M25661 Stiffness of right knee, not elsewhere classified: Secondary | ICD-10-CM | POA: Diagnosis not present

## 2018-07-21 DIAGNOSIS — R2681 Unsteadiness on feet: Secondary | ICD-10-CM | POA: Diagnosis not present

## 2018-07-21 DIAGNOSIS — M25652 Stiffness of left hip, not elsewhere classified: Secondary | ICD-10-CM | POA: Diagnosis not present

## 2018-07-21 DIAGNOSIS — M6281 Muscle weakness (generalized): Secondary | ICD-10-CM | POA: Diagnosis not present

## 2018-07-21 DIAGNOSIS — G546 Phantom limb syndrome with pain: Secondary | ICD-10-CM | POA: Diagnosis not present

## 2018-07-25 DIAGNOSIS — R2681 Unsteadiness on feet: Secondary | ICD-10-CM | POA: Diagnosis not present

## 2018-07-25 DIAGNOSIS — M6281 Muscle weakness (generalized): Secondary | ICD-10-CM | POA: Diagnosis not present

## 2018-07-25 DIAGNOSIS — M25661 Stiffness of right knee, not elsewhere classified: Secondary | ICD-10-CM | POA: Diagnosis not present

## 2018-07-25 DIAGNOSIS — M25652 Stiffness of left hip, not elsewhere classified: Secondary | ICD-10-CM | POA: Diagnosis not present

## 2018-07-25 DIAGNOSIS — G546 Phantom limb syndrome with pain: Secondary | ICD-10-CM | POA: Diagnosis not present

## 2018-07-25 DIAGNOSIS — Z89612 Acquired absence of left leg above knee: Secondary | ICD-10-CM | POA: Diagnosis not present

## 2018-07-25 DIAGNOSIS — R262 Difficulty in walking, not elsewhere classified: Secondary | ICD-10-CM | POA: Diagnosis not present

## 2018-07-27 DIAGNOSIS — M25652 Stiffness of left hip, not elsewhere classified: Secondary | ICD-10-CM | POA: Diagnosis not present

## 2018-07-27 DIAGNOSIS — M6281 Muscle weakness (generalized): Secondary | ICD-10-CM | POA: Diagnosis not present

## 2018-07-27 DIAGNOSIS — R262 Difficulty in walking, not elsewhere classified: Secondary | ICD-10-CM | POA: Diagnosis not present

## 2018-07-27 DIAGNOSIS — M25661 Stiffness of right knee, not elsewhere classified: Secondary | ICD-10-CM | POA: Diagnosis not present

## 2018-07-27 DIAGNOSIS — Z89612 Acquired absence of left leg above knee: Secondary | ICD-10-CM | POA: Diagnosis not present

## 2018-07-27 DIAGNOSIS — G546 Phantom limb syndrome with pain: Secondary | ICD-10-CM | POA: Diagnosis not present

## 2018-07-27 DIAGNOSIS — R2681 Unsteadiness on feet: Secondary | ICD-10-CM | POA: Diagnosis not present

## 2018-08-03 DIAGNOSIS — M25661 Stiffness of right knee, not elsewhere classified: Secondary | ICD-10-CM | POA: Diagnosis not present

## 2018-08-03 DIAGNOSIS — Z89612 Acquired absence of left leg above knee: Secondary | ICD-10-CM | POA: Diagnosis not present

## 2018-08-03 DIAGNOSIS — M25652 Stiffness of left hip, not elsewhere classified: Secondary | ICD-10-CM | POA: Diagnosis not present

## 2018-08-03 DIAGNOSIS — R2681 Unsteadiness on feet: Secondary | ICD-10-CM | POA: Diagnosis not present

## 2018-08-03 DIAGNOSIS — G546 Phantom limb syndrome with pain: Secondary | ICD-10-CM | POA: Diagnosis not present

## 2018-08-03 DIAGNOSIS — M6281 Muscle weakness (generalized): Secondary | ICD-10-CM | POA: Diagnosis not present

## 2018-08-07 DIAGNOSIS — M25652 Stiffness of left hip, not elsewhere classified: Secondary | ICD-10-CM | POA: Diagnosis not present

## 2018-08-07 DIAGNOSIS — M25661 Stiffness of right knee, not elsewhere classified: Secondary | ICD-10-CM | POA: Diagnosis not present

## 2018-08-07 DIAGNOSIS — M6281 Muscle weakness (generalized): Secondary | ICD-10-CM | POA: Diagnosis not present

## 2018-08-07 DIAGNOSIS — G546 Phantom limb syndrome with pain: Secondary | ICD-10-CM | POA: Diagnosis not present

## 2018-08-07 DIAGNOSIS — R2681 Unsteadiness on feet: Secondary | ICD-10-CM | POA: Diagnosis not present

## 2018-08-07 DIAGNOSIS — Z89612 Acquired absence of left leg above knee: Secondary | ICD-10-CM | POA: Diagnosis not present

## 2018-08-09 DIAGNOSIS — M25652 Stiffness of left hip, not elsewhere classified: Secondary | ICD-10-CM | POA: Diagnosis not present

## 2018-08-09 DIAGNOSIS — M6281 Muscle weakness (generalized): Secondary | ICD-10-CM | POA: Diagnosis not present

## 2018-08-09 DIAGNOSIS — G546 Phantom limb syndrome with pain: Secondary | ICD-10-CM | POA: Diagnosis not present

## 2018-08-09 DIAGNOSIS — R2681 Unsteadiness on feet: Secondary | ICD-10-CM | POA: Diagnosis not present

## 2018-08-09 DIAGNOSIS — Z89612 Acquired absence of left leg above knee: Secondary | ICD-10-CM | POA: Diagnosis not present

## 2018-08-09 DIAGNOSIS — M25661 Stiffness of right knee, not elsewhere classified: Secondary | ICD-10-CM | POA: Diagnosis not present

## 2018-08-11 ENCOUNTER — Ambulatory Visit (INDEPENDENT_AMBULATORY_CARE_PROVIDER_SITE_OTHER): Payer: Medicare HMO | Admitting: Family

## 2018-08-11 ENCOUNTER — Encounter: Payer: Self-pay | Admitting: Family

## 2018-08-11 ENCOUNTER — Other Ambulatory Visit: Payer: Self-pay

## 2018-08-11 ENCOUNTER — Ambulatory Visit (HOSPITAL_COMMUNITY)
Admission: RE | Admit: 2018-08-11 | Discharge: 2018-08-11 | Disposition: A | Discharge status: Home / Self Care | Payer: Medicare HMO | Source: Ambulatory Visit | Attending: Family | Admitting: Family

## 2018-08-11 VITALS — BP 115/82 | HR 81 | Resp 77 | Ht 60.0 in | Wt 110.0 lb

## 2018-08-11 DIAGNOSIS — Z89612 Acquired absence of left leg above knee: Secondary | ICD-10-CM | POA: Diagnosis not present

## 2018-08-11 DIAGNOSIS — I739 Peripheral vascular disease, unspecified: Secondary | ICD-10-CM | POA: Diagnosis not present

## 2018-08-11 DIAGNOSIS — Z95828 Presence of other vascular implants and grafts: Secondary | ICD-10-CM

## 2018-08-11 DIAGNOSIS — I7409 Other arterial embolism and thrombosis of abdominal aorta: Secondary | ICD-10-CM | POA: Diagnosis not present

## 2018-08-11 DIAGNOSIS — Z87891 Personal history of nicotine dependence: Secondary | ICD-10-CM | POA: Diagnosis not present

## 2018-08-11 DIAGNOSIS — I779 Disorder of arteries and arterioles, unspecified: Secondary | ICD-10-CM
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
## 2018-08-11 NOTE — Patient Instructions (Signed)

## 2018-08-15 DIAGNOSIS — M25661 Stiffness of right knee, not elsewhere classified: Secondary | ICD-10-CM | POA: Diagnosis not present

## 2018-08-15 DIAGNOSIS — R2681 Unsteadiness on feet: Secondary | ICD-10-CM | POA: Diagnosis not present

## 2018-08-15 DIAGNOSIS — G546 Phantom limb syndrome with pain: Secondary | ICD-10-CM | POA: Diagnosis not present

## 2018-08-15 DIAGNOSIS — M25652 Stiffness of left hip, not elsewhere classified: Secondary | ICD-10-CM | POA: Diagnosis not present

## 2018-08-15 DIAGNOSIS — Z89612 Acquired absence of left leg above knee: Secondary | ICD-10-CM | POA: Diagnosis not present

## 2018-08-15 DIAGNOSIS — M6281 Muscle weakness (generalized): Secondary | ICD-10-CM | POA: Diagnosis not present

## 2018-08-18 DIAGNOSIS — M6281 Muscle weakness (generalized): Secondary | ICD-10-CM | POA: Diagnosis not present

## 2018-08-18 DIAGNOSIS — M25661 Stiffness of right knee, not elsewhere classified: Secondary | ICD-10-CM | POA: Diagnosis not present

## 2018-08-18 DIAGNOSIS — M25652 Stiffness of left hip, not elsewhere classified: Secondary | ICD-10-CM | POA: Diagnosis not present

## 2018-08-18 DIAGNOSIS — R2681 Unsteadiness on feet: Secondary | ICD-10-CM | POA: Diagnosis not present

## 2018-08-18 DIAGNOSIS — G546 Phantom limb syndrome with pain: Secondary | ICD-10-CM | POA: Diagnosis not present

## 2018-08-18 DIAGNOSIS — Z89612 Acquired absence of left leg above knee: Secondary | ICD-10-CM | POA: Diagnosis not present

## 2018-08-22 DIAGNOSIS — Z89612 Acquired absence of left leg above knee: Secondary | ICD-10-CM | POA: Diagnosis not present

## 2018-08-22 DIAGNOSIS — R2681 Unsteadiness on feet: Secondary | ICD-10-CM | POA: Diagnosis not present

## 2018-08-22 DIAGNOSIS — M6281 Muscle weakness (generalized): Secondary | ICD-10-CM | POA: Diagnosis not present

## 2018-08-22 DIAGNOSIS — M25661 Stiffness of right knee, not elsewhere classified: Secondary | ICD-10-CM | POA: Diagnosis not present

## 2018-08-22 DIAGNOSIS — G546 Phantom limb syndrome with pain: Secondary | ICD-10-CM | POA: Diagnosis not present

## 2018-08-22 DIAGNOSIS — M25652 Stiffness of left hip, not elsewhere classified: Secondary | ICD-10-CM | POA: Diagnosis not present

## 2018-08-23 DIAGNOSIS — R69 Illness, unspecified: Secondary | ICD-10-CM | POA: Diagnosis not present

## 2018-08-24 DIAGNOSIS — G546 Phantom limb syndrome with pain: Secondary | ICD-10-CM | POA: Diagnosis not present

## 2018-08-24 DIAGNOSIS — Z89612 Acquired absence of left leg above knee: Secondary | ICD-10-CM | POA: Diagnosis not present

## 2018-08-24 DIAGNOSIS — M25652 Stiffness of left hip, not elsewhere classified: Secondary | ICD-10-CM | POA: Diagnosis not present

## 2018-08-24 DIAGNOSIS — R2681 Unsteadiness on feet: Secondary | ICD-10-CM | POA: Diagnosis not present

## 2018-08-24 DIAGNOSIS — M25661 Stiffness of right knee, not elsewhere classified: Secondary | ICD-10-CM | POA: Diagnosis not present

## 2018-08-24 DIAGNOSIS — M6281 Muscle weakness (generalized): Secondary | ICD-10-CM | POA: Diagnosis not present

## 2018-08-29 DIAGNOSIS — R2681 Unsteadiness on feet: Secondary | ICD-10-CM | POA: Diagnosis not present

## 2018-08-29 DIAGNOSIS — G546 Phantom limb syndrome with pain: Secondary | ICD-10-CM | POA: Diagnosis not present

## 2018-08-29 DIAGNOSIS — M25661 Stiffness of right knee, not elsewhere classified: Secondary | ICD-10-CM | POA: Diagnosis not present

## 2018-08-29 DIAGNOSIS — M6281 Muscle weakness (generalized): Secondary | ICD-10-CM | POA: Diagnosis not present

## 2018-08-29 DIAGNOSIS — Z89612 Acquired absence of left leg above knee: Secondary | ICD-10-CM | POA: Diagnosis not present

## 2018-08-29 DIAGNOSIS — M25652 Stiffness of left hip, not elsewhere classified: Secondary | ICD-10-CM | POA: Diagnosis not present

## 2018-08-29 DIAGNOSIS — R262 Difficulty in walking, not elsewhere classified: Secondary | ICD-10-CM | POA: Diagnosis not present

## 2018-08-31 DIAGNOSIS — R2681 Unsteadiness on feet: Secondary | ICD-10-CM | POA: Diagnosis not present

## 2018-08-31 DIAGNOSIS — M25661 Stiffness of right knee, not elsewhere classified: Secondary | ICD-10-CM | POA: Diagnosis not present

## 2018-08-31 DIAGNOSIS — G546 Phantom limb syndrome with pain: Secondary | ICD-10-CM | POA: Diagnosis not present

## 2018-08-31 DIAGNOSIS — Z89612 Acquired absence of left leg above knee: Secondary | ICD-10-CM | POA: Diagnosis not present

## 2018-08-31 DIAGNOSIS — M6281 Muscle weakness (generalized): Secondary | ICD-10-CM | POA: Diagnosis not present

## 2018-08-31 DIAGNOSIS — M25652 Stiffness of left hip, not elsewhere classified: Secondary | ICD-10-CM | POA: Diagnosis not present

## 2018-08-31 DIAGNOSIS — R262 Difficulty in walking, not elsewhere classified: Secondary | ICD-10-CM | POA: Diagnosis not present

## 2018-09-05 DIAGNOSIS — M25661 Stiffness of right knee, not elsewhere classified: Secondary | ICD-10-CM | POA: Diagnosis not present

## 2018-09-05 DIAGNOSIS — R262 Difficulty in walking, not elsewhere classified: Secondary | ICD-10-CM | POA: Diagnosis not present

## 2018-09-05 DIAGNOSIS — M25652 Stiffness of left hip, not elsewhere classified: Secondary | ICD-10-CM | POA: Diagnosis not present

## 2018-09-05 DIAGNOSIS — M6281 Muscle weakness (generalized): Secondary | ICD-10-CM | POA: Diagnosis not present

## 2018-09-05 DIAGNOSIS — G546 Phantom limb syndrome with pain: Secondary | ICD-10-CM | POA: Diagnosis not present

## 2018-09-05 DIAGNOSIS — R2681 Unsteadiness on feet: Secondary | ICD-10-CM | POA: Diagnosis not present

## 2018-09-05 DIAGNOSIS — Z89612 Acquired absence of left leg above knee: Secondary | ICD-10-CM | POA: Diagnosis not present

## 2018-09-07 DIAGNOSIS — M25652 Stiffness of left hip, not elsewhere classified: Secondary | ICD-10-CM | POA: Diagnosis not present

## 2018-09-07 DIAGNOSIS — Z89612 Acquired absence of left leg above knee: Secondary | ICD-10-CM | POA: Diagnosis not present

## 2018-09-07 DIAGNOSIS — R262 Difficulty in walking, not elsewhere classified: Secondary | ICD-10-CM | POA: Diagnosis not present

## 2018-09-07 DIAGNOSIS — M25661 Stiffness of right knee, not elsewhere classified: Secondary | ICD-10-CM | POA: Diagnosis not present

## 2018-09-07 DIAGNOSIS — R2681 Unsteadiness on feet: Secondary | ICD-10-CM | POA: Diagnosis not present

## 2018-09-07 DIAGNOSIS — G546 Phantom limb syndrome with pain: Secondary | ICD-10-CM | POA: Diagnosis not present

## 2018-09-07 DIAGNOSIS — M6281 Muscle weakness (generalized): Secondary | ICD-10-CM | POA: Diagnosis not present

## 2018-09-12 DIAGNOSIS — Z89612 Acquired absence of left leg above knee: Secondary | ICD-10-CM | POA: Diagnosis not present

## 2018-09-12 DIAGNOSIS — M25652 Stiffness of left hip, not elsewhere classified: Secondary | ICD-10-CM | POA: Diagnosis not present

## 2018-09-12 DIAGNOSIS — M6281 Muscle weakness (generalized): Secondary | ICD-10-CM | POA: Diagnosis not present

## 2018-09-12 DIAGNOSIS — G546 Phantom limb syndrome with pain: Secondary | ICD-10-CM | POA: Diagnosis not present

## 2018-09-12 DIAGNOSIS — R2681 Unsteadiness on feet: Secondary | ICD-10-CM | POA: Diagnosis not present

## 2018-09-12 DIAGNOSIS — R262 Difficulty in walking, not elsewhere classified: Secondary | ICD-10-CM | POA: Diagnosis not present

## 2018-09-12 DIAGNOSIS — M25661 Stiffness of right knee, not elsewhere classified: Secondary | ICD-10-CM | POA: Diagnosis not present

## 2018-09-14 DIAGNOSIS — Z89612 Acquired absence of left leg above knee: Secondary | ICD-10-CM | POA: Diagnosis not present

## 2018-09-14 DIAGNOSIS — M25661 Stiffness of right knee, not elsewhere classified: Secondary | ICD-10-CM | POA: Diagnosis not present

## 2018-09-14 DIAGNOSIS — R262 Difficulty in walking, not elsewhere classified: Secondary | ICD-10-CM | POA: Diagnosis not present

## 2018-09-14 DIAGNOSIS — G546 Phantom limb syndrome with pain: Secondary | ICD-10-CM | POA: Diagnosis not present

## 2018-09-14 DIAGNOSIS — R2681 Unsteadiness on feet: Secondary | ICD-10-CM | POA: Diagnosis not present

## 2018-09-14 DIAGNOSIS — M25652 Stiffness of left hip, not elsewhere classified: Secondary | ICD-10-CM | POA: Diagnosis not present

## 2018-09-14 DIAGNOSIS — M6281 Muscle weakness (generalized): Secondary | ICD-10-CM | POA: Diagnosis not present

## 2018-09-19 DIAGNOSIS — E7800 Pure hypercholesterolemia, unspecified: Secondary | ICD-10-CM | POA: Diagnosis not present

## 2018-09-19 DIAGNOSIS — M25652 Stiffness of left hip, not elsewhere classified: Secondary | ICD-10-CM | POA: Diagnosis not present

## 2018-09-19 DIAGNOSIS — G546 Phantom limb syndrome with pain: Secondary | ICD-10-CM | POA: Diagnosis not present

## 2018-09-19 DIAGNOSIS — N182 Chronic kidney disease, stage 2 (mild): Secondary | ICD-10-CM | POA: Diagnosis not present

## 2018-09-19 DIAGNOSIS — I739 Peripheral vascular disease, unspecified: Secondary | ICD-10-CM | POA: Diagnosis not present

## 2018-09-19 DIAGNOSIS — R262 Difficulty in walking, not elsewhere classified: Secondary | ICD-10-CM | POA: Diagnosis not present

## 2018-09-19 DIAGNOSIS — M25661 Stiffness of right knee, not elsewhere classified: Secondary | ICD-10-CM | POA: Diagnosis not present

## 2018-09-19 DIAGNOSIS — Z89612 Acquired absence of left leg above knee: Secondary | ICD-10-CM | POA: Diagnosis not present

## 2018-09-19 DIAGNOSIS — M6281 Muscle weakness (generalized): Secondary | ICD-10-CM | POA: Diagnosis not present

## 2018-09-19 DIAGNOSIS — R2681 Unsteadiness on feet: Secondary | ICD-10-CM | POA: Diagnosis not present

## 2018-09-20 DIAGNOSIS — R69 Illness, unspecified: Secondary | ICD-10-CM | POA: Diagnosis not present

## 2018-09-21 DIAGNOSIS — Z89612 Acquired absence of left leg above knee: Secondary | ICD-10-CM | POA: Diagnosis not present

## 2018-09-21 DIAGNOSIS — R2681 Unsteadiness on feet: Secondary | ICD-10-CM | POA: Diagnosis not present

## 2018-09-21 DIAGNOSIS — G546 Phantom limb syndrome with pain: Secondary | ICD-10-CM | POA: Diagnosis not present

## 2018-09-21 DIAGNOSIS — E785 Hyperlipidemia, unspecified: Secondary | ICD-10-CM | POA: Diagnosis not present

## 2018-09-21 DIAGNOSIS — E039 Hypothyroidism, unspecified: Secondary | ICD-10-CM | POA: Diagnosis not present

## 2018-09-21 DIAGNOSIS — M6281 Muscle weakness (generalized): Secondary | ICD-10-CM | POA: Diagnosis not present

## 2018-09-21 DIAGNOSIS — M25661 Stiffness of right knee, not elsewhere classified: Secondary | ICD-10-CM | POA: Diagnosis not present

## 2018-09-21 DIAGNOSIS — R262 Difficulty in walking, not elsewhere classified: Secondary | ICD-10-CM | POA: Diagnosis not present

## 2018-09-21 DIAGNOSIS — Z7901 Long term (current) use of anticoagulants: Secondary | ICD-10-CM | POA: Diagnosis not present

## 2018-09-21 DIAGNOSIS — Z23 Encounter for immunization: Secondary | ICD-10-CM | POA: Diagnosis not present

## 2018-09-21 DIAGNOSIS — I739 Peripheral vascular disease, unspecified: Secondary | ICD-10-CM | POA: Diagnosis not present

## 2018-09-21 DIAGNOSIS — M25652 Stiffness of left hip, not elsewhere classified: Secondary | ICD-10-CM | POA: Diagnosis not present

## 2018-09-28 DIAGNOSIS — M25661 Stiffness of right knee, not elsewhere classified: Secondary | ICD-10-CM | POA: Diagnosis not present

## 2018-09-28 DIAGNOSIS — Z89612 Acquired absence of left leg above knee: Secondary | ICD-10-CM | POA: Diagnosis not present

## 2018-09-28 DIAGNOSIS — G546 Phantom limb syndrome with pain: Secondary | ICD-10-CM | POA: Diagnosis not present

## 2018-09-28 DIAGNOSIS — R262 Difficulty in walking, not elsewhere classified: Secondary | ICD-10-CM | POA: Diagnosis not present

## 2018-09-28 DIAGNOSIS — M25652 Stiffness of left hip, not elsewhere classified: Secondary | ICD-10-CM | POA: Diagnosis not present

## 2018-09-28 DIAGNOSIS — R2681 Unsteadiness on feet: Secondary | ICD-10-CM | POA: Diagnosis not present

## 2018-09-28 DIAGNOSIS — M6281 Muscle weakness (generalized): Secondary | ICD-10-CM | POA: Diagnosis not present

## 2018-10-03 DIAGNOSIS — M25652 Stiffness of left hip, not elsewhere classified: Secondary | ICD-10-CM | POA: Diagnosis not present

## 2018-10-03 DIAGNOSIS — G546 Phantom limb syndrome with pain: Secondary | ICD-10-CM | POA: Diagnosis not present

## 2018-10-03 DIAGNOSIS — R2681 Unsteadiness on feet: Secondary | ICD-10-CM | POA: Diagnosis not present

## 2018-10-03 DIAGNOSIS — Z89612 Acquired absence of left leg above knee: Secondary | ICD-10-CM | POA: Diagnosis not present

## 2018-10-03 DIAGNOSIS — M6281 Muscle weakness (generalized): Secondary | ICD-10-CM | POA: Diagnosis not present

## 2018-10-03 DIAGNOSIS — M25661 Stiffness of right knee, not elsewhere classified: Secondary | ICD-10-CM | POA: Diagnosis not present

## 2018-10-06 DIAGNOSIS — Z89612 Acquired absence of left leg above knee: Secondary | ICD-10-CM | POA: Diagnosis not present

## 2018-10-06 DIAGNOSIS — G546 Phantom limb syndrome with pain: Secondary | ICD-10-CM | POA: Diagnosis not present

## 2018-10-06 DIAGNOSIS — M6281 Muscle weakness (generalized): Secondary | ICD-10-CM | POA: Diagnosis not present

## 2018-10-06 DIAGNOSIS — M25652 Stiffness of left hip, not elsewhere classified: Secondary | ICD-10-CM | POA: Diagnosis not present

## 2018-10-06 DIAGNOSIS — M25661 Stiffness of right knee, not elsewhere classified: Secondary | ICD-10-CM | POA: Diagnosis not present

## 2018-10-06 DIAGNOSIS — R2681 Unsteadiness on feet: Secondary | ICD-10-CM | POA: Diagnosis not present

## 2018-10-10 DIAGNOSIS — Z7901 Long term (current) use of anticoagulants: Secondary | ICD-10-CM | POA: Diagnosis not present

## 2018-10-11 DIAGNOSIS — Z89612 Acquired absence of left leg above knee: Secondary | ICD-10-CM | POA: Diagnosis not present

## 2018-10-11 DIAGNOSIS — M25652 Stiffness of left hip, not elsewhere classified: Secondary | ICD-10-CM | POA: Diagnosis not present

## 2018-10-11 DIAGNOSIS — G546 Phantom limb syndrome with pain: Secondary | ICD-10-CM | POA: Diagnosis not present

## 2018-10-11 DIAGNOSIS — R2681 Unsteadiness on feet: Secondary | ICD-10-CM | POA: Diagnosis not present

## 2018-10-11 DIAGNOSIS — M25661 Stiffness of right knee, not elsewhere classified: Secondary | ICD-10-CM | POA: Diagnosis not present

## 2018-10-11 DIAGNOSIS — M6281 Muscle weakness (generalized): Secondary | ICD-10-CM | POA: Diagnosis not present

## 2018-10-13 DIAGNOSIS — G546 Phantom limb syndrome with pain: Secondary | ICD-10-CM | POA: Diagnosis not present

## 2018-10-13 DIAGNOSIS — M25661 Stiffness of right knee, not elsewhere classified: Secondary | ICD-10-CM | POA: Diagnosis not present

## 2018-10-13 DIAGNOSIS — M25652 Stiffness of left hip, not elsewhere classified: Secondary | ICD-10-CM | POA: Diagnosis not present

## 2018-10-13 DIAGNOSIS — R2681 Unsteadiness on feet: Secondary | ICD-10-CM | POA: Diagnosis not present

## 2018-10-13 DIAGNOSIS — M6281 Muscle weakness (generalized): Secondary | ICD-10-CM | POA: Diagnosis not present

## 2018-10-13 DIAGNOSIS — Z89612 Acquired absence of left leg above knee: Secondary | ICD-10-CM | POA: Diagnosis not present

## 2018-10-16 DIAGNOSIS — G546 Phantom limb syndrome with pain: Secondary | ICD-10-CM | POA: Diagnosis not present

## 2018-10-16 DIAGNOSIS — M25661 Stiffness of right knee, not elsewhere classified: Secondary | ICD-10-CM | POA: Diagnosis not present

## 2018-10-16 DIAGNOSIS — Z89612 Acquired absence of left leg above knee: Secondary | ICD-10-CM | POA: Diagnosis not present

## 2018-10-16 DIAGNOSIS — R2681 Unsteadiness on feet: Secondary | ICD-10-CM | POA: Diagnosis not present

## 2018-10-16 DIAGNOSIS — M25652 Stiffness of left hip, not elsewhere classified: Secondary | ICD-10-CM | POA: Diagnosis not present

## 2018-10-16 DIAGNOSIS — M6281 Muscle weakness (generalized): Secondary | ICD-10-CM | POA: Diagnosis not present

## 2018-10-17 IMAGING — NM NM MISC PROCEDURE
6 series · 36 of 36 positions shown · non-contrast
Comparison: none

[Series 1: wbr_r-proj_st rest · 6.40mm/px · 6 of 64 frames shown]
[frame 6/64]
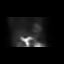
[frame 16/64]
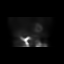
[frame 27/64]
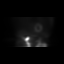
[frame 38/64]
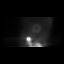
[frame 48/64]
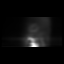
[frame 59/64]
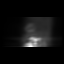

[Series 1: rest · 6.40mm/px · 6 of 63 frames shown]
[frame 6/63]
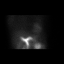
[frame 16/63]
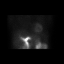
[frame 27/63]
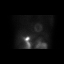
[frame 37/63]
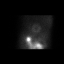
[frame 48/63]
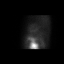
[frame 58/63]
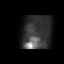

[Series 1: wbr_s-proj_st stress-sum-em · 6.40mm/px · 6 of 64 frames shown]
[frame 6/64]
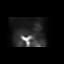
[frame 16/64]
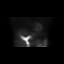
[frame 27/64]
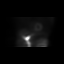
[frame 38/64]
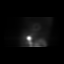
[frame 48/64]
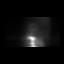
[frame 59/64]
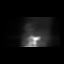

[Series 1: stress-gsp · 6.40mm/px · 6 of 512 frames shown]
[frame 43/512]
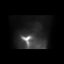
[frame 128/512]
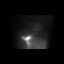
[frame 214/512]
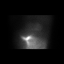
[frame 299/512]
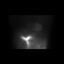
[frame 384/512]
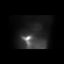
[frame 470/512]
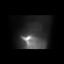

[Series 1: stress-sum-em · 6.40mm/px · 6 of 64 frames shown]
[frame 6/64]
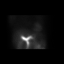
[frame 16/64]
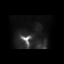
[frame 27/64]
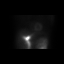
[frame 38/64]
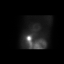
[frame 48/64]
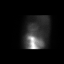
[frame 59/64]
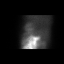

[Series 1: wbr_s-proj_st stress-gsp · 6.40mm/px · 6 of 512 frames shown]
[frame 43/512]
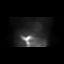
[frame 128/512]
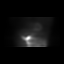
[frame 214/512]
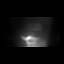
[frame 299/512]
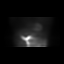
[frame 384/512]
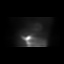
[frame 470/512]
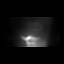

[36 of 36 positions shown; findings below may reference images not displayed]

Canned report from images found in remote index.

Refer to host system for actual result text.

## 2018-10-18 DIAGNOSIS — G546 Phantom limb syndrome with pain: Secondary | ICD-10-CM | POA: Diagnosis not present

## 2018-10-18 DIAGNOSIS — R2681 Unsteadiness on feet: Secondary | ICD-10-CM | POA: Diagnosis not present

## 2018-10-18 DIAGNOSIS — R69 Illness, unspecified: Secondary | ICD-10-CM | POA: Diagnosis not present

## 2018-10-18 DIAGNOSIS — M25652 Stiffness of left hip, not elsewhere classified: Secondary | ICD-10-CM | POA: Diagnosis not present

## 2018-10-18 DIAGNOSIS — Z89612 Acquired absence of left leg above knee: Secondary | ICD-10-CM | POA: Diagnosis not present

## 2018-10-18 DIAGNOSIS — M6281 Muscle weakness (generalized): Secondary | ICD-10-CM | POA: Diagnosis not present

## 2018-10-18 DIAGNOSIS — M25661 Stiffness of right knee, not elsewhere classified: Secondary | ICD-10-CM | POA: Diagnosis not present

## 2018-10-23 DIAGNOSIS — Z1231 Encounter for screening mammogram for malignant neoplasm of breast: Secondary | ICD-10-CM | POA: Diagnosis not present

## 2018-10-23 DIAGNOSIS — Z7901 Long term (current) use of anticoagulants: Secondary | ICD-10-CM | POA: Diagnosis not present

## 2018-10-23 DIAGNOSIS — M8588 Other specified disorders of bone density and structure, other site: Secondary | ICD-10-CM | POA: Diagnosis not present

## 2018-10-23 DIAGNOSIS — M810 Age-related osteoporosis without current pathological fracture: Secondary | ICD-10-CM | POA: Diagnosis not present

## 2018-10-25 DIAGNOSIS — G546 Phantom limb syndrome with pain: Secondary | ICD-10-CM | POA: Diagnosis not present

## 2018-10-25 DIAGNOSIS — R2681 Unsteadiness on feet: Secondary | ICD-10-CM | POA: Diagnosis not present

## 2018-10-25 DIAGNOSIS — Z89612 Acquired absence of left leg above knee: Secondary | ICD-10-CM | POA: Diagnosis not present

## 2018-10-25 DIAGNOSIS — M25661 Stiffness of right knee, not elsewhere classified: Secondary | ICD-10-CM | POA: Diagnosis not present

## 2018-10-25 DIAGNOSIS — M6281 Muscle weakness (generalized): Secondary | ICD-10-CM | POA: Diagnosis not present

## 2018-10-25 DIAGNOSIS — M25652 Stiffness of left hip, not elsewhere classified: Secondary | ICD-10-CM | POA: Diagnosis not present

## 2018-10-30 IMAGING — DX DG CHEST 1V PORT
1 series · 1 of 1 positions shown · non-contrast
Comparison: None.

CLINICAL DATA: Status post aortic bifurcation bypass graft.

EXAM:
PORTABLE CHEST 1 VIEW

[chest]
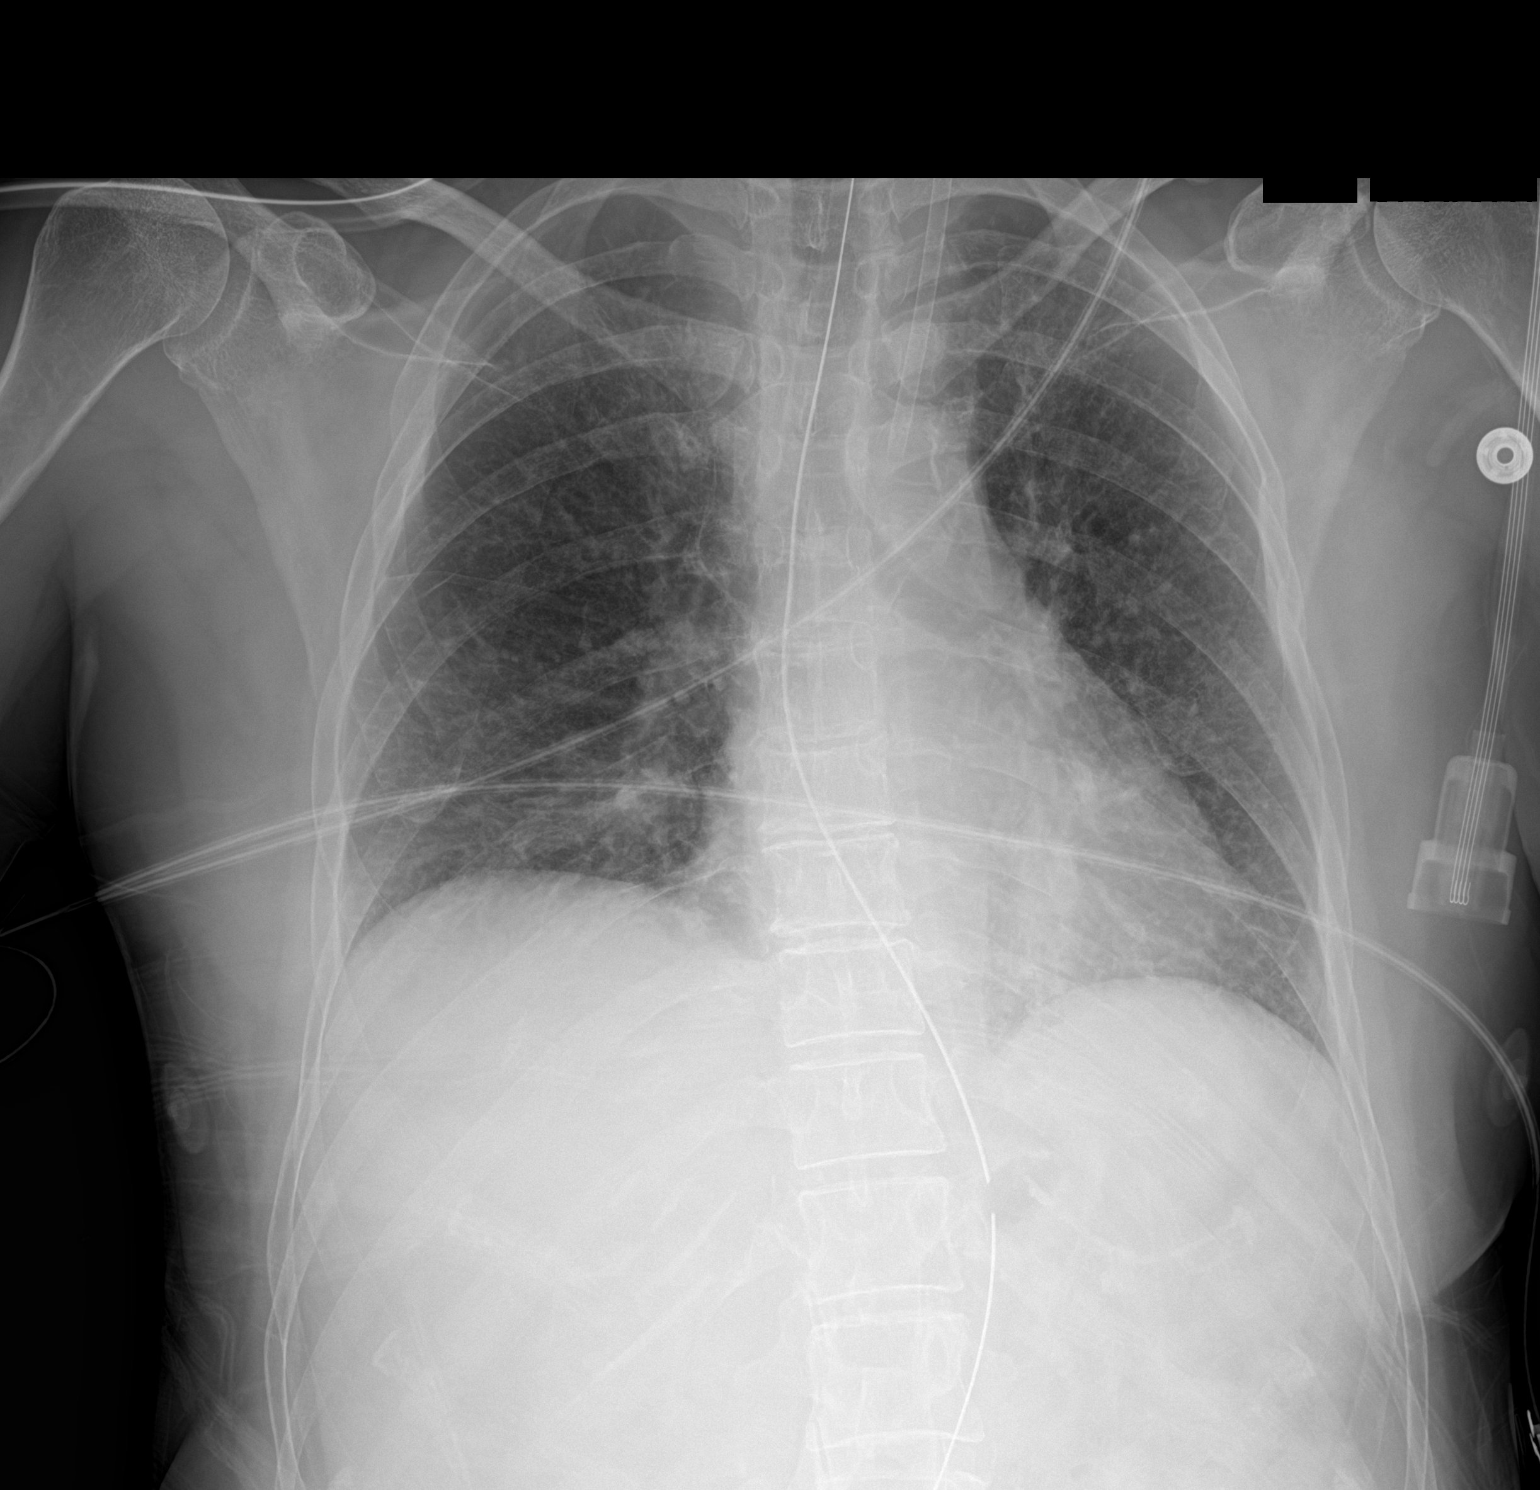

[1 of 1 positions shown; findings below may reference images not displayed]

FINDINGS: Left internal jugular approach central venous catheter sheath
terminates at the expected location of the innominate vein. Enteric
catheter terminates below the left diaphragm, which GE junction at
the expected location of gastric cardia.

Cardiomediastinal silhouette is normal. Mediastinal contours appear
intact.

There is no evidence of focal airspace consolidation, pleural
effusion or pneumothorax. Low lung volumes with prominence of the
interstitium.

Osseous structures are without acute abnormality. Lower cervical
spine fusion, partially visualized. Soft tissues are grossly normal.
IMPRESSION: Mild prominence of the interstitium which may represent mild
pulmonary vascular congestion.

Support apparatus as described.

## 2018-10-31 DIAGNOSIS — R2681 Unsteadiness on feet: Secondary | ICD-10-CM | POA: Diagnosis not present

## 2018-10-31 DIAGNOSIS — M25661 Stiffness of right knee, not elsewhere classified: Secondary | ICD-10-CM | POA: Diagnosis not present

## 2018-10-31 DIAGNOSIS — Z89612 Acquired absence of left leg above knee: Secondary | ICD-10-CM | POA: Diagnosis not present

## 2018-10-31 DIAGNOSIS — G546 Phantom limb syndrome with pain: Secondary | ICD-10-CM | POA: Diagnosis not present

## 2018-10-31 DIAGNOSIS — M25652 Stiffness of left hip, not elsewhere classified: Secondary | ICD-10-CM | POA: Diagnosis not present

## 2018-10-31 DIAGNOSIS — M6281 Muscle weakness (generalized): Secondary | ICD-10-CM | POA: Diagnosis not present

## 2018-11-02 DIAGNOSIS — M6281 Muscle weakness (generalized): Secondary | ICD-10-CM | POA: Diagnosis not present

## 2018-11-02 DIAGNOSIS — G546 Phantom limb syndrome with pain: Secondary | ICD-10-CM | POA: Diagnosis not present

## 2018-11-02 DIAGNOSIS — M25652 Stiffness of left hip, not elsewhere classified: Secondary | ICD-10-CM | POA: Diagnosis not present

## 2018-11-02 DIAGNOSIS — Z89612 Acquired absence of left leg above knee: Secondary | ICD-10-CM | POA: Diagnosis not present

## 2018-11-02 DIAGNOSIS — M25661 Stiffness of right knee, not elsewhere classified: Secondary | ICD-10-CM | POA: Diagnosis not present

## 2018-11-02 DIAGNOSIS — R2681 Unsteadiness on feet: Secondary | ICD-10-CM | POA: Diagnosis not present

## 2018-11-08 DIAGNOSIS — M25652 Stiffness of left hip, not elsewhere classified: Secondary | ICD-10-CM | POA: Diagnosis not present

## 2018-11-08 DIAGNOSIS — R2681 Unsteadiness on feet: Secondary | ICD-10-CM | POA: Diagnosis not present

## 2018-11-08 DIAGNOSIS — M6281 Muscle weakness (generalized): Secondary | ICD-10-CM | POA: Diagnosis not present

## 2018-11-08 DIAGNOSIS — Z89612 Acquired absence of left leg above knee: Secondary | ICD-10-CM | POA: Diagnosis not present

## 2018-11-08 DIAGNOSIS — G546 Phantom limb syndrome with pain: Secondary | ICD-10-CM | POA: Diagnosis not present

## 2018-11-08 DIAGNOSIS — M25661 Stiffness of right knee, not elsewhere classified: Secondary | ICD-10-CM | POA: Diagnosis not present

## 2018-11-14 DIAGNOSIS — R69 Illness, unspecified: Secondary | ICD-10-CM | POA: Diagnosis not present

## 2018-11-16 ENCOUNTER — Encounter: Payer: Self-pay | Admitting: Cardiology

## 2018-11-16 ENCOUNTER — Ambulatory Visit (INDEPENDENT_AMBULATORY_CARE_PROVIDER_SITE_OTHER): Payer: Medicare HMO | Admitting: Cardiology

## 2018-11-16 VITALS — BP 116/68 | HR 71 | Ht 60.0 in | Wt 116.0 lb

## 2018-11-16 DIAGNOSIS — I745 Embolism and thrombosis of iliac artery: Secondary | ICD-10-CM

## 2018-11-16 DIAGNOSIS — Z87891 Personal history of nicotine dependence: Secondary | ICD-10-CM | POA: Diagnosis not present

## 2018-11-16 DIAGNOSIS — T82868A Thrombosis of vascular prosthetic devices, implants and grafts, initial encounter: Secondary | ICD-10-CM | POA: Diagnosis not present

## 2018-11-16 DIAGNOSIS — I10 Essential (primary) hypertension: Secondary | ICD-10-CM

## 2018-11-16 DIAGNOSIS — M810 Age-related osteoporosis without current pathological fracture: Secondary | ICD-10-CM | POA: Insufficient documentation

## 2018-11-16 DIAGNOSIS — E119 Type 2 diabetes mellitus without complications: Secondary | ICD-10-CM

## 2018-11-16 DIAGNOSIS — I739 Peripheral vascular disease, unspecified: Secondary | ICD-10-CM | POA: Diagnosis not present

## 2018-11-16 DIAGNOSIS — E1169 Type 2 diabetes mellitus with other specified complication: Secondary | ICD-10-CM

## 2018-11-16 DIAGNOSIS — Z7901 Long term (current) use of anticoagulants: Secondary | ICD-10-CM | POA: Diagnosis not present

## 2018-11-16 DIAGNOSIS — E785 Hyperlipidemia, unspecified: Secondary | ICD-10-CM | POA: Diagnosis not present

## 2018-11-16 DIAGNOSIS — K219 Gastro-esophageal reflux disease without esophagitis: Secondary | ICD-10-CM | POA: Insufficient documentation

## 2018-11-16 DIAGNOSIS — E039 Hypothyroidism, unspecified: Secondary | ICD-10-CM

## 2018-11-16 DIAGNOSIS — R1310 Dysphagia, unspecified: Secondary | ICD-10-CM | POA: Insufficient documentation

## 2018-11-16 DIAGNOSIS — R0602 Shortness of breath: Secondary | ICD-10-CM | POA: Insufficient documentation

## 2018-11-16 DIAGNOSIS — I7409 Other arterial embolism and thrombosis of abdominal aorta: Secondary | ICD-10-CM

## 2018-11-16 DIAGNOSIS — R791 Abnormal coagulation profile: Secondary | ICD-10-CM | POA: Diagnosis not present

## 2018-11-16 HISTORY — DX: Type 2 diabetes mellitus without complications: E11.9

## 2018-11-16 HISTORY — DX: Shortness of breath: R06.02

## 2018-11-16 HISTORY — DX: Gastro-esophageal reflux disease without esophagitis: K21.9

## 2018-11-16 HISTORY — DX: Essential (primary) hypertension: I10

## 2018-11-16 HISTORY — DX: Hypothyroidism, unspecified: E03.9

## 2018-11-16 HISTORY — DX: Type 2 diabetes mellitus with other specified complication: E11.69

## 2018-11-16 NOTE — Progress Notes (Addendum)
Cardiology Office Note:    Date:  11/16/2018   ID:  Alison Lynch, DOB 04/16/1961, MRN [id number]  PCP:  Hal MoralesGunter, Tara G, NP  Cardiologist:  Garwin Brothersajan R Revankar, MD   Referring MD: Hal MoralesGunter, Tara G, NP    ASSESSMENT:    1. Benign essential HTN   2. Peripheral artery disease (HCC)   3. Hyperlipidemia LDL goal <70   4. Aortoiliac occlusive disease (HCC)   5. Aortobifemoral bypass graft thrombosis, initial encounter (HCC)   6. Former heavy tobacco smoker    PLAN:    In order of problems listed above:  1. Secondary prevention stressed with the patient.  Importance of compliance with diet and medication stressed and she vocalized understanding.  I mentioned to her never to go back to smoking again and she agrees. 2. Her blood pressure is stable.  Diet was discussed for dyslipidemia.  She is overall an active lady. 3. I told her that in view of the fact that she does not drive and her mother comes from out of town to take her for pro time check visits for warfarin that she could be a candidate for newer anticoagulants.  I do not know her well I think they should be fine but I will check with her cardiologist was taken care of her for the past several years Dr. Herbie BaltimoreHarding and if he okays it then I will initiate this medication. 4. Patient will be seen in follow-up appointment in 6 months or earlier if the patient has any concerns 5. Her blood work is followed by her primary care physician and is due to be done in the next few days.   Medication Adjustments/Labs and Tests Ordered: Current medicines are reviewed at length with the patient today.  Concerns regarding medicines are outlined above.  No orders of the defined types were placed in this encounter.  No orders of the defined types were placed in this encounter.    No chief complaint on file.    History of Present Illness:    Alison Lynch is a 57 y.o. female.  Patient has past medical history of essential hypertension, peripheral  vascular disease, high risk to iliac disease.  Patient underwent amputation of the left lower extremity.  Patient mentions to me that she had a blood clot and acute limb ischemia.  Subsequently she has been on anticoagulation with warfarin.  She also has peripheral vascular disease and has been on appropriate medications with statin therapy.  She has seen my partner Dr. Herbie BaltimoreHarding in the past but she says that she does not drive and is hard [place] [facility] [place] and Dr. Herbie BaltimoreHarding suggested that she see 1 of [place].  Patient denies any problems at this time and takes care of activities of daily living.  No chest pain orthopnea or PND.  She does not smoke anymore since the past several years.    Past Medical History:  Diagnosis Date  . Cervical myelopathy (HCC)   . CKD (chronic kidney disease), stage III (HCC)   . 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 Depression   . Dysphagia   . Hx of migraine headaches   . Hyperlipidemia   . Hypothyroid   . Leriche syndrome (HCC) 12/2016   Chronically occluded aorta  . Osteoporosis   . PAD (peripheral artery  disease) (HCC)    Status post left AKA for extensive thromboembolism, right leg has in-line flow through popliteal artery only.  Bilateral iliac stents occluded  . Panic attacks   . PTSD (post-traumatic stress disorder)     Past Surgical History:  Procedure Laterality Date  . ABDOMINAL AORTIC ANEURYSM REPAIR N/A 04/19/2018   Procedure: AORTIC EMBOLECTOMY AORTOGRAM;  Surgeon: Larina EarthlyEarly, Todd F, MD;  [facility];  Service: Vascular;  Laterality: N/A;  . ABDOMINAL AORTOGRAM N/A 04/19/2018   Procedure: ABDOMINAL AORTOGRAM;  Surgeon: Larina EarthlyEarly, Todd F, MD;  [facility];  Service: Vascular;  Laterality: N/A;  . ABDOMINAL AORTOGRAM W/LOWER EXTREMITY Right 04/27/2017   Procedure:  Abdominal Aortogram w/Lower Extremity;  Surgeon: Maeola Harmanain, Brandon Christopher, MD;  [facility];  Service: Cardiovascular;  Laterality: Right: Bilateral iliac stents occluded. Normal aortic flow with patent hypogastric and renal arteries. Femoral arteries patent by ultrasound; right leg and showed below-knee runoff -> plan Aortobifem Bypass  . AMPUTATION Left 01/09/2017   Procedure: AMPUTATION ABOVE KNEE;  Surgeon: Maeola HarmanBrandon Christopher Cain, MD;  [facility];  Service: Vascular;  Laterality: Left;  . AORTA - BILATERAL FEMORAL ARTERY BYPASS GRAFT N/A 05/30/2017   Procedure: AORTA BIFEMORAL BYPASS GRAFT USING [id number] HEMASHIELD GOLD GRAFT;  Surgeon: Maeola Harmanain, Brandon Christopher, MD;  [facility];  Service: Vascular;  Laterality: N/A;  . AORTOGRAM Left 01/08/2017   Procedure: Ultrasound Guided Cannulation Left Common Femoral Artery; Aortagram;  Surgeon: Maeola HarmanBrandon Christopher Cain, MD;  [facility];  Service: Vascular;  Laterality: Left;  . ARTERY EXPLORATION Left 01/08/2017   Procedure: Left Common Femoral Artery Exploration;  Surgeon: Maeola HarmanBrandon Christopher Cain, MD;  [facility];  Service: Vascular;  Laterality: Left;  . BACK SURGERY    . EMBOLECTOMY Left 01/08/2017   Procedure: Left Lower Extremity Thromboembolectomy;  Surgeon: Maeola HarmanBrandon Christopher Cain, MD;  [facility];  Service: Vascular;  Laterality: Left;  . ENDARTERECTOMY POPLITEAL Left 01/08/2017   Procedure: Left Popliteal Endarterectomy;  Surgeon: Maeola HarmanBrandon Christopher Cain, MD;  [facility];  Service: Vascular;  Laterality: Left;  . FEMORAL ARTERY EXPLORATION Bilateral 05/20/2018   Procedure: FEMORAL EXPLORATION, RIGHT;  Surgeon: Larina EarthlyEarly, Todd F, MD;  [facility];  Service: Vascular;  Laterality: Bilateral;  . INSERTION OF ILIAC STENT Bilateral 01/08/2017   Procedure: Bilateral Common Iliac Stent and Left Popliteal Artery Stent;  Surgeon: Maeola HarmanBrandon Christopher Cain, MD;  [facility];  Service: Vascular;   Laterality: Bilateral;  . LOWER EXTREMITY ANGIOGRAM Bilateral 01/08/2017   Procedure: Bilateral Lower Extremity Angiogram;  Surgeon: Maeola HarmanBrandon Christopher Cain, MD;  [facility];  Service: Vascular;  Laterality: Bilateral;  . PATCH ANGIOPLASTY  01/08/2017   Procedure: Patch Angioplasty Left Popliteal Artery ;  Surgeon: Maeola HarmanBrandon Christopher Cain, MD;  [facility];  Service: Vascular;;  . TRANSTHORACIC ECHOCARDIOGRAM  12/2016   Normal EF 60-65%.  Essentially normal echocardiogram.  No significant valvular lesions.    Current Medications: Current Meds  Medication Sig  . aspirin EC 81 MG tablet Take 81 mg by mouth daily.  Marland Kitchen. atorvastatin (LIPITOR) 80 MG tablet Take 1 tablet (80 mg total) by mouth daily.  . busPIRone (BUSPAR) 10 MG tablet Take 1 tablet by mouth 3 (three) times daily.  . clopidogrel (PLAVIX) 75 MG tablet Take 75 mg by mouth daily.  Marland Kitchen. ezetimibe (ZETIA) 10 MG tablet Take 10 mg by mouth daily.  Marland Kitchen. gabapentin (NEURONTIN) 400 MG capsule Take 1,200 mg by mouth See admin instructions. 400mg  in the  morning, and 800mg  at bedtime  . levothyroxine (SYNTHROID, LEVOTHROID) 50 MCG tablet Take 50 mcg by mouth daily before breakfast.  . mirtazapine (REMERON) 15 MG tablet Take 15 mg by mouth at bedtime.  Marland Kitchen omeprazole (PRILOSEC) 20 MG capsule Take 20 mg by mouth daily.  Marland Kitchen PARoxetine (PAXIL) 20 MG tablet Take 1 tablet by mouth daily.  . QUEtiapine (SEROQUEL) 50 MG tablet Take 1 tablet by mouth as needed.  . warfarin (COUMADIN) 5 MG tablet Take 0.5 tablets (2.5 mg total) by mouth daily. (Patient taking differently: Take 5 mg by mouth daily. )     Allergies:   Clindamycin/lincomycin   Social History   Socioeconomic History  . Marital status: Married    Spouse name: Not on file  . Number of children: Not on file  . Years of education: Not on file  . Highest education level: Not on file  Occupational History  . Not on file  Social Needs  . Financial resource strain: Not on file  . Food  insecurity:    Worry: Not on file    Inability: Not on file  . Transportation needs:    Medical: Not on file    Non-medical: Not on file  Tobacco Use  . Smoking status: Former Smoker    Packs/day: 0.50    Years: 40.00    Pack years: 20.00    Types: Cigarettes    Last attempt to quit: 12/21/2016    Years since quitting: 1.9  . Smokeless tobacco: Never Used  Substance and Sexual Activity  . Alcohol use: No  . Drug use: No  . Sexual activity: Not Currently  Lifestyle  . Physical activity:    Days per week: Not on file    Minutes per session: Not on file  . Stress: Not on file  Relationships  . Social connections:    Talks on phone: Not on file    Gets together: Not on file    Attends religious service: Not on file    Active member of club or organization: Not on file    Attends meetings of clubs or organizations: Not on file    Relationship status: Not on file  Other Topics Concern  . Not on file  Social History Narrative   She is married. No kids. Currently disabled. No longer working. She still smokes at least a pack a day, but is hoping to quit with patches. Does not drink alcohol or use illicit drugs     Family History: The patient's family history includes Cancer in her maternal grandmother; Heart disease in her maternal grandfather.  ROS:   Please see the history of present illness.    All other systems reviewed and are negative.  EKGs/Labs/Other Studies Reviewed:    The following studies were reviewed today: I discussed my findings with the patient at extensive length.   Recent Labs: 05/20/2018: ALT 36; BUN 12; Creatinine, Ser 1.88; Hemoglobin 12.0; Platelets 308; Potassium 4.2; Sodium 142  Recent Lipid Panel    Component Value Date/Time   CHOL 175 12/06/2017 1517   TRIG 250 (H) 12/06/2017 1517   HDL 40 12/06/2017 1517   CHOLHDL 4.4 12/06/2017 1517   LDLCALC 85 12/06/2017 1517    Physical Exam:    VS:  BP 116/68 (BP Location: Right Arm, Patient  Position: Sitting, Cuff Size: Normal)   Pulse 71   Ht 5' (1.524 m)   Wt 116 lb (52.6 kg)   SpO2 97%   BMI 22.65  kg/m     Wt Readings from Last 3 Encounters:  11/16/18 116 lb (52.6 kg)  08/11/18 110 lb (49.9 kg)  06/09/18 110 lb (49.9 kg)     GEN: Patient is in no acute distress HEENT: Normal NECK: No JVD; No carotid bruits LYMPHATICS: No lymphadenopathy CARDIAC: Hear sounds regular, 2/6 systolic murmur at the apex. RESPIRATORY:  Clear to auscultation without rales, wheezing or rhonchi  ABDOMEN: Soft, non-tender, non-distended MUSCULOSKELETAL:  No edema; No deformity. Left LL prosthesis  SKIN: Warm and dry NEUROLOGIC:  Alert and oriented x 3 PSYCHIATRIC:  Normal affect   Signed, Garwin Brothers, MD  11/16/2018 10:09 AM    [facility]

## 2018-11-16 NOTE — Patient Instructions (Signed)
Medication Instructions:  Your physician recommends that you continue on your current medications as directed. Please refer to the Current Medication list given to you today.  We will be discussing your warfarin with your primary care.  If you need a refill on your cardiac medications before your next appointment, please call your pharmacy.   Lab work: Your physician recommends that you have the following labs drawn: PT/INR to be done today.  If you have labs (blood work) drawn today and your tests are completely normal, you will receive your results only by: Marland Kitchen. MyChart Message (if you have MyChart) OR . A paper copy in the mail If you have any lab test that is abnormal or we need to change your treatment, we will call you to review the results.  Testing/Procedures: None  Follow-Up: At [facility], you and your health needs are our priority.  As part of our continuing mission to provide you with exceptional heart care, we have created designated Provider Care Teams.  These Care Teams include your primary Cardiologist (physician) and Advanced Practice Providers (APPs -  Physician Assistants and Nurse Practitioners) who all work together to provide you with the care you need, when you need it.  You will need a follow up appointment in 6 months.  Please call our office 2 months in advance to schedule this appointment.  You may see another member of our [facility] Provider Team in [place]: Gypsy Balsamobert Krasowski, MD . Norman HerrlichBrian Munley, MD  Any Other Special Instructions Will Be Listed Below (If Applicable).

## 2018-11-16 NOTE — Addendum Note (Signed)
Addended by: Belva CromeEVANKAR, Glady Ouderkirk R on: 11/16/2018 10:34 AM   Modules accepted: Orders

## 2018-11-16 NOTE — Addendum Note (Signed)
Addended by: Craige CottaANDERSON, ASHLEY S on: 11/16/2018 10:34 AM   Modules accepted: Orders

## 2018-12-04 DIAGNOSIS — Z7901 Long term (current) use of anticoagulants: Secondary | ICD-10-CM | POA: Diagnosis not present

## 2018-12-19 DIAGNOSIS — R69 Illness, unspecified: Secondary | ICD-10-CM | POA: Diagnosis not present

## 2018-12-21 DIAGNOSIS — Z7901 Long term (current) use of anticoagulants: Secondary | ICD-10-CM | POA: Diagnosis not present

## 2018-12-21 DIAGNOSIS — E039 Hypothyroidism, unspecified: Secondary | ICD-10-CM | POA: Diagnosis not present

## 2018-12-21 DIAGNOSIS — R7301 Impaired fasting glucose: Secondary | ICD-10-CM | POA: Diagnosis not present

## 2018-12-21 DIAGNOSIS — M810 Age-related osteoporosis without current pathological fracture: Secondary | ICD-10-CM | POA: Diagnosis not present

## 2018-12-21 DIAGNOSIS — I739 Peripheral vascular disease, unspecified: Secondary | ICD-10-CM | POA: Diagnosis not present

## 2019-01-29 DIAGNOSIS — E7800 Pure hypercholesterolemia, unspecified: Secondary | ICD-10-CM | POA: Diagnosis not present

## 2019-01-29 DIAGNOSIS — I739 Peripheral vascular disease, unspecified: Secondary | ICD-10-CM | POA: Diagnosis not present

## 2019-01-29 DIAGNOSIS — N182 Chronic kidney disease, stage 2 (mild): Secondary | ICD-10-CM | POA: Diagnosis not present

## 2019-02-05 ENCOUNTER — Other Ambulatory Visit: Payer: Self-pay

## 2019-02-05 DIAGNOSIS — I779 Disorder of arteries and arterioles, unspecified: Secondary | ICD-10-CM

## 2019-02-05 DIAGNOSIS — I739 Peripheral vascular disease, unspecified: Secondary | ICD-10-CM

## 2019-02-09 ENCOUNTER — Encounter (HOSPITAL_COMMUNITY): Payer: Medicare HMO

## 2019-02-09 ENCOUNTER — Ambulatory Visit: Payer: Medicare HMO | Admitting: Family

## 2019-02-20 ENCOUNTER — Ambulatory Visit: Payer: Medicare HMO | Admitting: Family

## 2019-02-20 ENCOUNTER — Encounter (HOSPITAL_COMMUNITY): Payer: Medicare HMO

## 2019-04-25 DIAGNOSIS — I739 Peripheral vascular disease, unspecified: Secondary | ICD-10-CM | POA: Diagnosis not present

## 2019-04-25 DIAGNOSIS — M810 Age-related osteoporosis without current pathological fracture: Secondary | ICD-10-CM | POA: Diagnosis not present

## 2019-04-25 DIAGNOSIS — E039 Hypothyroidism, unspecified: Secondary | ICD-10-CM | POA: Diagnosis not present

## 2019-04-25 DIAGNOSIS — Z89612 Acquired absence of left leg above knee: Secondary | ICD-10-CM | POA: Diagnosis not present

## 2019-04-25 DIAGNOSIS — Z7901 Long term (current) use of anticoagulants: Secondary | ICD-10-CM | POA: Diagnosis not present

## 2019-04-25 DIAGNOSIS — N182 Chronic kidney disease, stage 2 (mild): Secondary | ICD-10-CM | POA: Diagnosis not present

## 2019-04-27 ENCOUNTER — Telehealth (HOSPITAL_COMMUNITY): Payer: Self-pay | Admitting: Rehabilitation

## 2019-04-27 NOTE — Telephone Encounter (Signed)
The above patient or their representative was contacted and gave the following answers to these questions:         Do you have any of the following symptoms? No Fever                    Cough                   Shortness of breath  Do  you have any of the following other symptoms? No  muscle pain         vomiting,        diarrhea        rash         weakness        red eye        abdominal pain         bruising          bruising or bleeding              joint pain           severe headache  Have you been in contact with someone who was or has been sick in the past 2 weeks? Husband had strep throat but patient does not recall how long ago. Yes                 Unsure                         Unable to assess   Does the person that you were in contact with have any of the following symptoms?  Cough         shortness of breath           muscle pain         vomiting,            diarrhea            rash            weakness           fever            red eye           abdominal pain          bruising  or  bleeding                joint pain                severe headache             Have you  or someone you have been in contact with traveled internationally in the last month?  No      If yes, which countries?  Have you  or someone you have been in contact with traveled outside [place] in the last month?  No      If yes, which state and city?  COMMENTS OR ACTION PLAN FOR THIS PATIENT:

## 2019-04-30 ENCOUNTER — Other Ambulatory Visit: Payer: Self-pay

## 2019-04-30 ENCOUNTER — Ambulatory Visit (HOSPITAL_COMMUNITY)
Admission: RE | Admit: 2019-04-30 | Discharge: 2019-04-30 | Disposition: A | Discharge status: Home / Self Care | Payer: Medicare HMO | Source: Ambulatory Visit | Attending: Vascular Surgery | Admitting: Vascular Surgery

## 2019-04-30 ENCOUNTER — Ambulatory Visit (INDEPENDENT_AMBULATORY_CARE_PROVIDER_SITE_OTHER): Payer: Medicare HMO | Admitting: Family

## 2019-04-30 ENCOUNTER — Encounter: Payer: Self-pay | Admitting: Family

## 2019-04-30 VITALS — BP 107/66 | HR 65 | Temp 97.2°F | Resp 14 | Ht 61.0 in | Wt 115.0 lb

## 2019-04-30 DIAGNOSIS — Z7722 Contact with and (suspected) exposure to environmental tobacco smoke (acute) (chronic): Secondary | ICD-10-CM | POA: Diagnosis not present

## 2019-04-30 DIAGNOSIS — I7409 Other arterial embolism and thrombosis of abdominal aorta: Secondary | ICD-10-CM

## 2019-04-30 DIAGNOSIS — I779 Disorder of arteries and arterioles, unspecified: Secondary | ICD-10-CM

## 2019-04-30 DIAGNOSIS — Z87891 Personal history of nicotine dependence: Secondary | ICD-10-CM

## 2019-04-30 DIAGNOSIS — I739 Peripheral vascular disease, unspecified: Secondary | ICD-10-CM | POA: Diagnosis not present

## 2019-04-30 DIAGNOSIS — Z95828 Presence of other vascular implants and grafts: Secondary | ICD-10-CM | POA: Diagnosis not present
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
## 2019-04-30 NOTE — Patient Instructions (Addendum)
Preventing Exposure to Secondhand Smoke, Adult Secondhand smoke is smoke that comes from burning tobacco. It includes smoke from cigarettes, pipes, or cigars. Being exposed to secondhand smoke is as dangerous as smoking. Common places you may be exposed to secondhand smoke include:  Work.  Public places, like restaurants, shopping centers, and parks.  Home, especially if you live in an apartment building. How can secondhand smoke affect me? There is no safe level of secondhand smoke. Smoke from cigarettes contains thousands of chemicals, including chemicals that are known to cause cancer. Secondhand smoke can cause many health problems, such as:  Cancer.  Heart disease.  Stroke.  Pregnancy problems, such as pregnancy loss (miscarriage), low birth weight, and early birth (premature). What actions can I take to prevent exposure to secondhand smoke?   Do not smoke.  Keep your home smoke-free.  Do not allow smoking in your car.  Avoid public places where smoking is allowed.  Talk to your employer about your company's policies on smoking. ? Your workplace should have a policy separating smoking areas from nonsmoking areas. ? Smoking areas should have a system for ventilating and cleaning the air. Where to find more information Centers for Disease Control and Prevention (CDC): https://www.cdc.gov/tobacco/ American Cancer Society: https://www.cancer.org American Heart Association: https://www.heart.org Summary  Secondhand smoke is smoke that comes from burning tobacco. Secondhand smoke exposes you to the dangers of smoking, even if you are not the one smoking.  There is no safe level of secondhand smoke. Several chemicals in secondhand smoke are known to cause cancer. Secondhand smoke can also cause many other health problems.  To prevent exposure to secondhand smoke, do not smoke, discourage others from smoking, keep your home and car smoke-free, and avoid places where smoking is  allowed. This information is not intended to replace advice given to you by your health care provider. Make sure you discuss any questions you have with your health care provider. Document Released: 12/23/2004 Document Revised: 12/22/2017 Document Reviewed: 12/22/2017 Elsevier Interactive Patient Education  2019 Elsevier Inc.    Peripheral Vascular Disease  Peripheral vascular disease (PVD) is a disease of the blood vessels that are not part of your heart and brain. A simple term for PVD is poor circulation. In most cases, PVD narrows the blood vessels that carry blood from your heart to the rest of your body. This can reduce the supply of blood to your arms, legs, and internal organs, like your stomach or kidneys. However, PVD most often affects a person's lower legs and feet. Without treatment, PVD tends to get worse. PVD can also lead to acute ischemic limb. This is when an arm or leg suddenly cannot get enough blood. This is a medical emergency. Follow these instructions at home: Lifestyle  Do not use any products that contain nicotine or tobacco, such as cigarettes and e-cigarettes. If you need help quitting, ask your doctor.  Lose weight if you are overweight. Or, stay at a healthy weight as told by your doctor.  Eat a diet that is low in fat and cholesterol. If you need help, ask your doctor.  Exercise regularly. Ask your doctor for activities that are right for you. General instructions  Take over-the-counter and prescription medicines only as told by your doctor.  Take good care of your feet: ? Wear comfortable shoes that fit well. ? Check your feet often for any cuts or sores.  Keep all follow-up visits as told by your doctor This is important. Contact a doctor if:    You have cramps in your legs when you walk.  You have leg pain when you are at rest.  You have coldness in a leg or foot.  Your skin changes.  You are unable to get or have an erection (erectile  dysfunction).  You have cuts or sores on your feet that do not heal. Get help right away if:  Your arm or leg turns cold, numb, and blue.  Your arms or legs become red, warm, swollen, painful, or numb.  You have chest pain.  You have trouble breathing.  You suddenly have weakness in your face, arm, or leg.  You become very confused or you cannot speak.  You suddenly have a very bad headache.  You suddenly cannot see. Summary  Peripheral vascular disease (PVD) is a disease of the blood vessels.  A simple term for PVD is poor circulation. Without treatment, PVD tends to get worse.  Treatment may include exercise, low fat and low cholesterol diet, and quitting smoking. This information is not intended to replace advice given to you by your health care provider. Make sure you discuss any questions you have with your health care provider. Document Released: 02/09/2010 Document Revised: 12/23/2016 Document Reviewed: 12/23/2016 Elsevier Interactive Patient Education  2019 Elsevier Inc.  

## 2019-05-08 DIAGNOSIS — M810 Age-related osteoporosis without current pathological fracture: Secondary | ICD-10-CM | POA: Diagnosis not present

## 2019-08-08 DIAGNOSIS — R69 Illness, unspecified: Secondary | ICD-10-CM | POA: Diagnosis not present

## 2019-08-10 DIAGNOSIS — H52209 Unspecified astigmatism, unspecified eye: Secondary | ICD-10-CM | POA: Diagnosis not present

## 2019-08-10 DIAGNOSIS — H524 Presbyopia: Secondary | ICD-10-CM | POA: Diagnosis not present

## 2019-08-10 DIAGNOSIS — H5203 Hypermetropia, bilateral: Secondary | ICD-10-CM | POA: Diagnosis not present

## 2019-10-23 DIAGNOSIS — M810 Age-related osteoporosis without current pathological fracture: Secondary | ICD-10-CM | POA: Diagnosis not present

## 2019-10-23 DIAGNOSIS — Z23 Encounter for immunization: Secondary | ICD-10-CM | POA: Diagnosis not present

## 2019-10-23 DIAGNOSIS — N182 Chronic kidney disease, stage 2 (mild): Secondary | ICD-10-CM | POA: Diagnosis not present

## 2019-10-23 DIAGNOSIS — I739 Peripheral vascular disease, unspecified: Secondary | ICD-10-CM | POA: Diagnosis not present

## 2019-10-23 DIAGNOSIS — Z7901 Long term (current) use of anticoagulants: Secondary | ICD-10-CM | POA: Diagnosis not present

## 2019-10-23 DIAGNOSIS — R69 Illness, unspecified: Secondary | ICD-10-CM | POA: Diagnosis not present

## 2019-10-23 DIAGNOSIS — Z20828 Contact with and (suspected) exposure to other viral communicable diseases: Secondary | ICD-10-CM | POA: Diagnosis not present

## 2019-10-23 DIAGNOSIS — E039 Hypothyroidism, unspecified: Secondary | ICD-10-CM | POA: Diagnosis not present

## 2019-11-09 DIAGNOSIS — R69 Illness, unspecified: Secondary | ICD-10-CM | POA: Diagnosis not present

## 2020-01-01 ENCOUNTER — Ambulatory Visit: Payer: Medicare HMO | Admitting: Cardiology

## 2020-01-01 ENCOUNTER — Encounter: Payer: Self-pay | Admitting: Cardiology

## 2020-01-01 ENCOUNTER — Other Ambulatory Visit: Payer: Self-pay

## 2020-01-01 ENCOUNTER — Ambulatory Visit (INDEPENDENT_AMBULATORY_CARE_PROVIDER_SITE_OTHER): Payer: Medicare HMO | Admitting: Cardiology

## 2020-01-01 VITALS — BP 110/68 | HR 94 | Ht 61.0 in | Wt 115.0 lb

## 2020-01-01 DIAGNOSIS — T82868A Thrombosis of vascular prosthetic devices, implants and grafts, initial encounter: Secondary | ICD-10-CM | POA: Diagnosis not present

## 2020-01-01 DIAGNOSIS — I745 Embolism and thrombosis of iliac artery: Secondary | ICD-10-CM

## 2020-01-01 DIAGNOSIS — I10 Essential (primary) hypertension: Secondary | ICD-10-CM

## 2020-01-01 DIAGNOSIS — Z87891 Personal history of nicotine dependence: Secondary | ICD-10-CM

## 2020-01-01 DIAGNOSIS — I739 Peripheral vascular disease, unspecified: Secondary | ICD-10-CM | POA: Diagnosis not present

## 2020-01-01 NOTE — Progress Notes (Signed)
Cardiology Office Note:    Date:  01/01/2020   ID:  Alison Lynch, DOB 1961/04/16, MRN [id number]  PCP:  Charlynn Court, NP  Cardiologist:  Jenean Lindau, MD   Referring MD: Charlynn Court, NP    ASSESSMENT:    1. Benign essential HTN   2. Peripheral artery disease ([place])   3. Aortobifemoral bypass graft thrombosis, initial encounter ([place])   4. Former heavy tobacco smoker    PLAN:    In order of problems listed above:  1. Atherosclerotic vascular disease/peripheral vascular disease: Stable at this time and managed by her vascular colleagues.  Patient has had blood work in the past month or so from her primary care doctor and we will try to get a copy of these reports.  I would like to assess her lipids and make sure they are optimal. 2. Mixed dyslipidemia: On statin therapy.  Diet was discussed.  As mentioned above 3. Essential hypertension: Blood pressure stable and dietary issues including salt intake was discussed 4. She promises never to go back to smoking again.Patient will be seen in follow-up appointment in 6 months or earlier if the patient has any concerns    Medication Adjustments/Labs and Tests Ordered: Current medicines are reviewed at length with the patient today.  Concerns regarding medicines are outlined above.  No orders of the defined types were placed in this encounter.  No orders of the defined types were placed in this encounter.    No chief complaint on file.    History of Present Illness:    Alison Lynch is a 59 y.o. female.  Patient has peripheral vascular disease and has undergone amputation of the left lower extremity.  She uses a prosthesis.  She is in touch with her vascular surgeons on a regular basis.  She denies any chest pain orthopnea or PND.  She walks with a walker and her prosthetic leg and with this she has no issues.  She occasionally has sternal burning after taking food.  She is in touch with her primary doctor and I told her that she  might need a gastroenterology evaluation for this.  No radiation of the symptoms to the neck or to the arms.  Again with exertion she does not get the symptoms.  At the time of my evaluation, the patient is alert awake oriented and in no distress.  Past Medical History:  Diagnosis Date  . Cervical myelopathy ([place])   . CKD (chronic kidney disease), stage III   . 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 Depression   . Dysphagia   . Hx of migraine headaches   . Hyperlipidemia   . Hypothyroid   . Leriche syndrome ([place]) 12/2016   Chronically occluded aorta  . Osteoporosis   . PAD (peripheral artery disease) (H[facility])    Status post left AKA for extensive thromboembolism, right leg has in-line flow through popliteal artery only.  Bilateral iliac stents occluded  . Panic attacks   . PTSD (post-traumatic stress disorder)     Past Surgical History:  Procedure Laterality Date  . ABDOMINAL AORTIC ANEURYSM REPAIR N/A 04/19/2018   Procedure: AORTIC EMBOLECTOMY AORTOGRAM;  Surgeon: Rosetta Posner, MD;  Location: [facility] OR;  Service: Vascular;  Laterality: N/A;  . ABDOMINAL AORTOGRAM N/A 04/19/2018   Procedure: ABDOMINAL AORTOGRAM;  Surgeon: Rosetta Posner, MD;  Location: [place];  Service:  Vascular;  Laterality: N/A;  . ABDOMINAL AORTOGRAM W/LOWER EXTREMITY Right 04/27/2017   Procedure: Abdominal Aortogram w/Lower Extremity;  Surgeon: Maeola Harman, MD;  Location: [facility] INVASIVE CV LAB;  Service: Cardiovascular;  Laterality: Right: Bilateral iliac stents occluded. Normal aortic flow with patent hypogastric and renal arteries. Femoral arteries patent by ultrasound; right leg and showed below-knee runoff -> plan Aortobifem Bypass  . AMPUTATION Left 01/09/2017   Procedure: AMPUTATION ABOVE KNEE;  Surgeon: Maeola Harman, MD;  Location: [facility] OR;  Service: Vascular;  Laterality: Left;  .  AORTA - BILATERAL FEMORAL ARTERY BYPASS GRAFT N/A 05/30/2017   Procedure: AORTA BIFEMORAL BYPASS GRAFT USING 14X7MMX40CM HEMASHIELD GOLD GRAFT;  Surgeon: Maeola Harman, MD;  Location: [facility] OR;  Service: Vascular;  Laterality: N/A;  . AORTOGRAM Left 01/08/2017   Procedure: Ultrasound Guided Cannulation Left Common Femoral Artery; Aortagram;  Surgeon: Maeola Harman, MD;  Location: [facility] OR;  Service: Vascular;  Laterality: Left;  . ARTERY EXPLORATION Left 01/08/2017   Procedure: Left Common Femoral Artery Exploration;  Surgeon: Maeola Harman, MD;  Location: [facility] OR;  Service: Vascular;  Laterality: Left;  . BACK SURGERY    . EMBOLECTOMY Left 01/08/2017   Procedure: Left Lower Extremity Thromboembolectomy;  Surgeon: Maeola Harman, MD;  Location: [facility] OR;  Service: Vascular;  Laterality: Left;  . ENDARTERECTOMY POPLITEAL Left 01/08/2017   Procedure: Left Popliteal Endarterectomy;  Surgeon: Maeola Harman, MD;  [facility] OR;  Service: Vascular;  Laterality: Left;  . FEMORAL ARTERY EXPLORATION Bilateral 05/20/2018   Procedure: FEMORAL EXPLORATION, RIGHT;  Surgeon: Larina Earthly, MD;  Location: [facility] OR;  Service: Vascular;  Laterality: Bilateral;  . INSERTION OF ILIAC STENT Bilateral 01/08/2017   Procedure: Bilateral Common Iliac Stent and Left Popliteal Artery Stent;  Surgeon: Maeola Harman, MD;  Location: [facility] OR;  Service: Vascular;  Laterality: Bilateral;  . LOWER EXTREMITY ANGIOGRAM Bilateral 01/08/2017   Procedure: Bilateral Lower Extremity Angiogram;  Surgeon: Maeola Harman, MD;  Location: [facility] OR;  Service: Vascular;  Laterality: Bilateral;  . PATCH ANGIOPLASTY  01/08/2017   Procedure: Patch Angioplasty Left Popliteal Artery ;  Surgeon: Maeola Harman, MD;  Location: [facility] OR;  Service: Vascular;;  . TRANSTHORACIC ECHOCARDIOGRAM  12/2016   Normal EF 60-65%.  Essentially normal echocardiogram.  No significant valvular lesions.     Current Medications: Current Meds  Medication Sig  . aspirin EC 81 MG tablet Take 81 mg by mouth daily.  Marland Kitchen atorvastatin (LIPITOR) 80 MG tablet Take 1 tablet (80 mg total) by mouth daily.  . busPIRone (BUSPAR) 15 MG tablet Take 2 tablets by mouth 2 (two) times daily.   . Cholecalciferol (VITAMIN D) 50 MCG (2000 UT) CAPS Take by mouth.  . ezetimibe (ZETIA) 10 MG tablet Take 10 mg by mouth daily.  Marland Kitchen gabapentin (NEURONTIN) 400 MG capsule Take 1,200 mg by mouth See admin instructions. 400mg  in the morning, and 800mg  at bedtime  . levothyroxine (SYNTHROID, LEVOTHROID) 50 MCG tablet Take 50 mcg by mouth daily before breakfast.  . PARoxetine (PAXIL) 40 MG tablet Take 1 tablet by mouth daily.   PROLIA 60 MG/ML SOSY injection   . QUEtiapine (SEROQUEL) 50 MG tablet Take 2 tablets by mouth as needed.   . rivaroxaban (XARELTO) 2.5 MG TABS tablet Take 2.5 mg by mouth 2 (two) times daily.     Allergies:   Clindamycin/lincomycin   Social History   Socioeconomic History  . Marital status: Married    Spouse  name: Not on file  . Number of children: Not on file  . Years of education: Not on file  . Highest education level: Not on file  Occupational History  . Not on file  Tobacco Use  . Smoking status: Former Smoker    Packs/day: 0.50    Years: 40.00    Pack years: 20.00    Types: Cigarettes    Quit date: 12/21/2016    Years since quitting: 3.0  . Smokeless tobacco: Never Used  Substance and Sexual Activity  . Alcohol use: No  . Drug use: No  . Sexual activity: Not Currently  Other Topics Concern  . Not on file  Social History Narrative   She is married. No kids. Currently disabled. No longer working. She still smokes at least a pack a day, but is hoping to quit with patches. Does not drink alcohol or use illicit drugs   Social Determinants of Health   Financial Resource Strain:   . Difficulty of Paying Living Expenses: Not on file  Food Insecurity:   . Worried About Patent examiner in the Last Year: Not on file  . Ran Out of Food in the Last Year: Not on file  Transportation Needs:   . Lack of Transportation (Medical): Not on file  . Lack of Transportation (Non-Medical): Not on file  Physical Activity:   . Days of Exercise per Week: Not on file  . Minutes of Exercise per Session: Not on file  Stress:   . Feeling of Stress : Not on file  Social Connections:   . Frequency of Communication with Friends and Family: Not on file  . Frequency of Social Gatherings with Friends and Family: Not on file  . Attends Religious Services: Not on file  . Active Member of Clubs or Organizations: Not on file  . Attends Banker Meetings: Not on file  . Marital Status: Not on file     Family History: The patient's family history includes Cancer in her maternal grandmother; Heart disease in her maternal grandfather.  ROS:   Please see the history of present illness.    All other systems reviewed and are negative.  EKGs/Labs/Other Studies Reviewed:    The following studies were reviewed today: EKG was sinus rhythm and nonspecific ST-T changes   Recent Labs: No results found for requested labs within last 8760 hours.  Recent Lipid Panel    Component Value Date/Time   CHOL 175 12/06/2017 1517   TRIG 250 (H) 12/06/2017 1517   HDL 40 12/06/2017 1517   CHOLHDL 4.4 12/06/2017 1517   LDLCALC 85 12/06/2017 1517    Physical Exam:    VS:  BP 110/68   Pulse 94   Ht 5\' 1"  (1.549 m)   Wt 115 lb (52.2 kg)   SpO2 96%   BMI 21.73 kg/m     Wt Readings from Last 3 Encounters:  01/01/20 115 lb (52.2 kg)  04/30/19 115 lb (52.2 kg)  11/16/18 116 lb (52.6 kg)     GEN: Patient is in no acute distress HEENT: Normal NECK: No JVD; No carotid bruits LYMPHATICS: No lymphadenopathy CARDIAC: Hear sounds regular, 2/6 systolic murmur at the apex. RESPIRATORY:  Clear to auscultation without rales, wheezing or rhonchi  ABDOMEN: Soft, non-tender,  non-distended MUSCULOSKELETAL:  No edema; No deformity  SKIN: Warm and dry NEUROLOGIC:  Alert and oriented x 3 PSYCHIATRIC:  Normal affect   Signed, 11/18/18, MD  01/01/2020 11:06 AM  [facility]

## 2020-01-01 NOTE — Patient Instructions (Signed)
Medication Instructions:  No medication changes *If you need a refill on your cardiac medications before your next appointment, please call your pharmacy*  Lab Work: None ordered If you have labs (blood work) drawn today and your tests are completely normal, you will receive your results only by: . MyChart Message (if you have MyChart) OR . A paper copy in the mail If you have any lab test that is abnormal or we need to change your treatment, we will call you to review the results.  Testing/Procedures: None ordered  Follow-Up: At [facility], you and your health needs are our priority.  As part of our continuing mission to provide you with exceptional heart care, we have created designated Provider Care Teams.  These Care Teams include your primary Cardiologist (physician) and Advanced Practice Providers (APPs -  Physician Assistants and Nurse Practitioners) who all work together to provide you with the care you need, when you need it.  Your next appointment:   6 month(s)  The format for your next appointment:   In Person  Provider:   Rajan Revankar, MD  Other Instructions NA 

## 2020-02-07 ENCOUNTER — Telehealth (HOSPITAL_COMMUNITY): Payer: Self-pay

## 2020-02-07 ENCOUNTER — Other Ambulatory Visit: Payer: Self-pay | Admitting: *Deleted

## 2020-02-07 DIAGNOSIS — I739 Peripheral vascular disease, unspecified: Secondary | ICD-10-CM

## 2020-02-07 DIAGNOSIS — I779 Disorder of arteries and arterioles, unspecified: Secondary | ICD-10-CM

## 2020-02-07 NOTE — Telephone Encounter (Signed)

## 2020-02-08 ENCOUNTER — Ambulatory Visit (INDEPENDENT_AMBULATORY_CARE_PROVIDER_SITE_OTHER): Payer: Medicare HMO | Admitting: Physician Assistant

## 2020-02-08 ENCOUNTER — Ambulatory Visit (HOSPITAL_COMMUNITY)
Admission: RE | Admit: 2020-02-08 | Discharge: 2020-02-08 | Disposition: A | Discharge status: Home / Self Care | Payer: Medicare HMO | Source: Ambulatory Visit | Attending: Vascular Surgery | Admitting: Vascular Surgery

## 2020-02-08 ENCOUNTER — Other Ambulatory Visit: Payer: Self-pay

## 2020-02-08 VITALS — BP 118/77 | HR 103 | Temp 97.1°F | Resp 18 | Ht 61.0 in | Wt 115.0 lb

## 2020-02-08 DIAGNOSIS — I779 Disorder of arteries and arterioles, unspecified: Secondary | ICD-10-CM | POA: Diagnosis not present

## 2020-02-08 DIAGNOSIS — I7409 Other arterial embolism and thrombosis of abdominal aorta: Secondary | ICD-10-CM

## 2020-02-08 DIAGNOSIS — I739 Peripheral vascular disease, unspecified: Secondary | ICD-10-CM | POA: Diagnosis not present
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
## 2020-02-12 ENCOUNTER — Other Ambulatory Visit: Payer: Self-pay | Admitting: *Deleted

## 2020-02-12 DIAGNOSIS — I739 Peripheral vascular disease, unspecified: Secondary | ICD-10-CM

## 2020-02-12 DIAGNOSIS — I779 Disorder of arteries and arterioles, unspecified: Secondary | ICD-10-CM

## 2020-06-04 ENCOUNTER — Other Ambulatory Visit: Payer: Self-pay

## 2020-06-18 DIAGNOSIS — D72829 Elevated white blood cell count, unspecified: Secondary | ICD-10-CM

## 2020-06-18 DIAGNOSIS — F419 Anxiety disorder, unspecified: Secondary | ICD-10-CM | POA: Insufficient documentation

## 2020-06-18 DIAGNOSIS — N179 Acute kidney failure, unspecified: Secondary | ICD-10-CM

## 2020-06-18 DIAGNOSIS — G959 Disease of spinal cord, unspecified: Secondary | ICD-10-CM

## 2020-06-18 DIAGNOSIS — E781 Pure hyperglyceridemia: Secondary | ICD-10-CM

## 2020-06-18 DIAGNOSIS — I700 Atherosclerosis of aorta: Secondary | ICD-10-CM

## 2020-06-18 DIAGNOSIS — I10 Essential (primary) hypertension: Secondary | ICD-10-CM

## 2020-06-18 DIAGNOSIS — D696 Thrombocytopenia, unspecified: Secondary | ICD-10-CM

## 2020-06-18 DIAGNOSIS — G546 Phantom limb syndrome with pain: Secondary | ICD-10-CM

## 2020-06-18 HISTORY — DX: Pure hyperglyceridemia: E78.1

## 2020-06-18 HISTORY — DX: Anxiety disorder, unspecified: F41.9

## 2020-06-18 HISTORY — DX: Atherosclerosis of aorta: I70.0

## 2020-06-18 HISTORY — DX: Acute kidney failure, unspecified: N17.9

## 2020-06-18 HISTORY — DX: Phantom limb syndrome with pain: G54.6

## 2020-06-18 HISTORY — DX: Elevated white blood cell count, unspecified: D72.829

## 2020-06-18 HISTORY — DX: Thrombocytopenia, unspecified: D69.6

## 2020-06-18 HISTORY — DX: Essential (primary) hypertension: I10

## 2020-06-23 DIAGNOSIS — N183 Chronic kidney disease, stage 3 unspecified: Secondary | ICD-10-CM

## 2020-06-23 DIAGNOSIS — E559 Vitamin D deficiency, unspecified: Secondary | ICD-10-CM

## 2020-06-23 HISTORY — DX: Vitamin D deficiency, unspecified: E55.9

## 2020-06-23 HISTORY — DX: Chronic kidney disease, stage 3 unspecified: N18.30

## 2021-02-06 ENCOUNTER — Other Ambulatory Visit: Payer: Self-pay | Admitting: *Deleted

## 2021-02-06 DIAGNOSIS — I739 Peripheral vascular disease, unspecified: Secondary | ICD-10-CM

## 2021-02-10 ENCOUNTER — Encounter (HOSPITAL_COMMUNITY): Payer: Medicare HMO

## 2021-02-10 ENCOUNTER — Ambulatory Visit: Payer: Medicare HMO

## 2021-02-10 ENCOUNTER — Encounter: Payer: Self-pay | Admitting: Physician Assistant

## 2021-02-10 ENCOUNTER — Other Ambulatory Visit: Payer: Self-pay

## 2021-02-10 ENCOUNTER — Ambulatory Visit (INDEPENDENT_AMBULATORY_CARE_PROVIDER_SITE_OTHER): Payer: Medicare HMO | Admitting: Physician Assistant

## 2021-02-10 ENCOUNTER — Ambulatory Visit (HOSPITAL_COMMUNITY)
Admission: RE | Admit: 2021-02-10 | Discharge: 2021-02-10 | Disposition: A | Discharge status: Home / Self Care | Payer: Medicare HMO | Source: Ambulatory Visit | Attending: Vascular Surgery | Admitting: Vascular Surgery

## 2021-02-10 VITALS — BP 159/99 | HR 116 | Temp 97.6°F | Resp 20 | Ht 61.0 in

## 2021-02-10 DIAGNOSIS — I779 Disorder of arteries and arterioles, unspecified: Secondary | ICD-10-CM | POA: Diagnosis not present

## 2021-02-10 DIAGNOSIS — I739 Peripheral vascular disease, unspecified: Secondary | ICD-10-CM | POA: Diagnosis present
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
## 2021-02-25 DIAGNOSIS — I739 Peripheral vascular disease, unspecified: Secondary | ICD-10-CM | POA: Insufficient documentation

## 2021-02-25 DIAGNOSIS — N183 Chronic kidney disease, stage 3 unspecified: Secondary | ICD-10-CM | POA: Insufficient documentation

## 2021-02-25 DIAGNOSIS — E039 Hypothyroidism, unspecified: Secondary | ICD-10-CM | POA: Insufficient documentation

## 2021-02-25 DIAGNOSIS — F410 Panic disorder [episodic paroxysmal anxiety] without agoraphobia: Secondary | ICD-10-CM | POA: Insufficient documentation

## 2021-02-25 DIAGNOSIS — Z8669 Personal history of other diseases of the nervous system and sense organs: Secondary | ICD-10-CM | POA: Insufficient documentation

## 2021-02-26 ENCOUNTER — Other Ambulatory Visit: Payer: Self-pay

## 2021-02-26 ENCOUNTER — Encounter: Payer: Self-pay | Admitting: Cardiology

## 2021-02-26 ENCOUNTER — Ambulatory Visit (INDEPENDENT_AMBULATORY_CARE_PROVIDER_SITE_OTHER): Payer: Medicare HMO | Admitting: Cardiology

## 2021-02-26 VITALS — BP 140/78 | HR 100 | Ht 61.0 in | Wt 127.0 lb

## 2021-02-26 DIAGNOSIS — I10 Essential (primary) hypertension: Secondary | ICD-10-CM | POA: Diagnosis not present

## 2021-02-26 DIAGNOSIS — E785 Hyperlipidemia, unspecified: Secondary | ICD-10-CM | POA: Diagnosis not present

## 2021-02-26 DIAGNOSIS — I779 Disorder of arteries and arterioles, unspecified: Secondary | ICD-10-CM

## 2021-02-26 DIAGNOSIS — Z87891 Personal history of nicotine dependence: Secondary | ICD-10-CM

## 2021-02-26 NOTE — Addendum Note (Signed)
Addended by: Daine Gunther, Elmarie Shiley L on: 02/26/2021 01:01 PM   Modules accepted: Orders

## 2021-02-26 NOTE — Progress Notes (Signed)
Cardiology Office Note:    Date:  02/26/2021   ID:  [name], DOB Oct 28, 1961, MRN [id number]  PCP:  Hal Morales, NP  Cardiologist:  Garwin Brothers, MD   Referring MD: Hal Morales, NP    ASSESSMENT:    1. Benign essential HTN   2. PAOD (peripheral arterial occlusive disease) (HCC)   3. Hyperlipidemia LDL goal <70   4. Benign essential hypertension   5. Former heavy tobacco smoker    PLAN:    In order of problems listed above:  1. Peripheral vascular disease: Atherosclerotic vascular disease.  Secondary prevention stressed with patient.  Importance of compliance with diet medication stressed any vocalized understanding.  She ambulates to the best of her ability. 2. Essential hypertension: Blood pressure stable and diet and lifestyle modification urged.  Salt intake issues were discussed. 3. Mixed dyslipidemia: Diet emphasized.  She is fasting today and we will do complete blood work. 4. History of cigarette smoking: She promises never to go back. 5. Patient will be seen in follow-up appointment in 6 months or earlier if the patient has any concerns    Medication Adjustments/Labs and Tests Ordered: Current medicines are reviewed at length with the patient today.  Concerns regarding medicines are outlined above.  Orders Placed This Encounter  Procedures  . Basic metabolic panel  . CBC with Differential/Platelet  . TSH  . Lipid panel  . Hepatic function panel   No orders of the defined types were placed in this encounter.    Chief Complaint  Patient presents with  . Follow-up     History of Present Illness:    Alison Lynch is a 60 y.o. female.  Patient has past medical history of peripheral vascular disease atherosclerosis of the aorta essential hypertension dyslipidemia and amputation of left lower extremity above the knee.  She denies any problems at this time and takes care of activities of daily living.  She ambulates to the best of her ability.   Fortunately she does not smoke.  At the time of my evaluation, the patient is alert awake oriented and in no distress.  Past Medical History:  Diagnosis Date  . Above knee amputation of left lower extremity (HCC) 01/13/2017  . Acquired absence of left leg above knee (HCC) 01/13/2017  . Acute kidney failure, unspecified (HCC) 06/18/2020  . AKI (acute kidney injury) (HCC)   . Anxiety 06/18/2020  . Aortobifemoral bypass graft thrombosis, initial encounter (HCC) 04/19/2018  . Aortoiliac occlusive disease (HCC) 05/30/2017  . Arterial graft thrombosis (HCC) 04/19/2018  . Arthrodesis status 05/18/2016  . Atherosclerosis of aorta (HCC) 06/18/2020  . Benign essential hypertension 06/18/2020   Formatting of this note might be different from the original. Last Assessment & Plan:  Formatting of this note might be different from the original. Clearly not hypertensive.  Not on any medications.  . Cervical myelopathy (HCC)   . Chronic pain syndrome   . CKD (chronic kidney disease), stage III (HCC)   . Critical lower limb ischemia (HCC) 12/2016   Extensive left leg thromboembolism with eventual left AKA; May 2018 -> developed right leg critical limb ischemia  . Depression   . Diabetes mellitus (HCC) 11/16/2018  . Dysphagia   . 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 Last Assessment & Plan:  Formatting of this note might be different from the original. Followed by Dr.  Multiple surgeries.  Now doing  well.  Trying to get up and going with her prosthesis.  we need to be following her lipids. Smoking cessation counseling.  . Former heavy tobacco smoker 07/03/2017  . GERD (gastroesophageal reflux disease) 11/16/2018  . Hx of migraine headaches   . Hyperlipidemia   . Hyperlipidemia LDL goal <70 05/12/2017  . Hypertension 11/16/2018  . Hypothyroid   . Hypothyroidism 11/16/2018  . Ischemia of left lower extremity 01/08/2017   Formatting of  this note might be different from the original. Last Assessment & Plan:  Formatting of this note might be different from the original. Status post left AKA, now with left lower extremity ischemia. Pending aortobifem bypass. The urgent procedure.  . Ischemia of lower extremity 01/08/2017  . Leriche syndrome (HCC) 12/2016   Chronically occluded aorta  . Leukocytosis 06/18/2020  . Osteoporosis   . PAD (peripheral artery disease) (HCC)    Status post left AKA for extensive thromboembolism, right leg has in-line flow through popliteal artery only.  Bilateral iliac stents occluded  . Panic attacks   . Peripheral artery disease (HCC) 05/12/2017  . Peripheral vascular disease (HCC) 05/12/2017   Formatting of this note might be different from the original. Last Assessment & Plan:  Formatting of this note might be different from the original. At this point I want to let her recover from her surgery and her amputation.  She is asked to follow-up in the [place] office.  My plan was for her to be seen in in 1year once our  office reopens.  She would need to be reassigned to a n  . Phantom limb syndrome with pain (HCC) 06/18/2020  . Pre-operative cardiovascular examination 05/12/2017  . PTSD (post-traumatic stress disorder)   . Pure hypertriglyceridemia 06/18/2020  . S/P cervical spinal fusion 05/18/2016  . Shortness of breath 11/16/2018   Overview:  exertional   Formatting of this note might be different from the original. exertional Formatting of this note might be different from the original. Formatting of this note might be different from the original. exertional Formatting of this note might be different from the original. Overview:  exertional  . Stage 3 chronic kidney disease (HCC) 06/23/2020  . Thrombocytopenia (HCC) 06/18/2020  . Vitamin D deficiency, unspecified 06/23/2020  . Wound infection 05/20/2018    Past Surgical History:  Procedure Laterality Date  . ABDOMINAL AORTIC ANEURYSM REPAIR  N/A 04/19/2018   Procedure: AORTIC EMBOLECTOMY AORTOGRAM;  Surgeon: Larina Earthly, MD;  Location[facility] OR;  Service: Vascular;  Laterality: N/A;  . ABDOMINAL AORTOGRAM N/A 04/19/2018   Procedure: ABDOMINAL AORTOGRAM;  Surgeon: Larina Earthly, MD;  Location: [facility] OR;  Service: Vascular;  Laterality: N/A;  . ABDOMINAL AORTOGRAM W/LOWER EXTREMITY Right 04/27/2017   Procedure: Abdominal Aortogram w/Lower Extremity;  Surgeon: Maeola Harman, MD;  Location: [facility] INVASIVE CV LAB;  Service: Cardiovascular;  Laterality: Right: Bilateral iliac stents occluded. Normal aortic flow with patent hypogastric and renal arteries. Femoral arteries patent by ultrasound; right leg and showed below-knee runoff -> plan Aortobifem Bypass  . AMPUTATION Left 01/09/2017   Procedure: AMPUTATION ABOVE KNEE;  Surgeon: Maeola Harman, MD;  Location: [facility] OR;  Service: Vascular;  Laterality: Left;  . AORTA - BILATERAL FEMORAL ARTERY BYPASS GRAFT N/A 05/30/2017   Procedure: AORTA BIFEMORAL BYPASS GRAFT USING 14X7MMX40CM HEMASHIELD GOLD GRAFT;  Surgeon: Maeola Harman, MD;  Location: [facility] OR;  Service: Vascular;  Laterality: N/A;  . AORTOGRAM Left 01/08/2017   Procedure: Ultrasound Guided Cannulation Left Common  Femoral Artery; Aortagram;  Surgeon: Maeola Harman, MD;  Location: [facility] OR;  Service: Vascular;  Laterality: Left;  . ARTERY EXPLORATION Left 01/08/2017   Procedure: Left Common Femoral Artery Exploration;  Surgeon: Maeola Harman, MD;  Location: [facility] OR;  Service: Vascular;  Laterality: Left;  . BACK SURGERY    . EMBOLECTOMY Left 01/08/2017   Procedure: Left Lower Extremity Thromboembolectomy;  Surgeon: Maeola Harman, MD;  Location: [facility] OR;  Service: Vascular;  Laterality: Left;  . ENDARTERECTOMY POPLITEAL Left 01/08/2017   Procedure: Left Popliteal Endarterectomy;  Surgeon: Maeola Harman, MD;  Location: [facility] OR;  Service: Vascular;  Laterality: Left;  . FEMORAL ARTERY  EXPLORATION Bilateral 05/20/2018   Procedure: FEMORAL EXPLORATION, RIGHT;  Surgeon: Larina Earthly, MD;  Location: [facility] OR;  Service: Vascular;  Laterality: Bilateral;  . INSERTION OF ILIAC STENT Bilateral 01/08/2017   Procedure: Bilateral Common Iliac Stent and Left Popliteal Artery Stent;  Surgeon: Maeola Harman, MD;  Location: [facility] OR;  Service: Vascular;  Laterality: Bilateral;  . LOWER EXTREMITY ANGIOGRAM Bilateral 01/08/2017   Procedure: Bilateral Lower Extremity Angiogram;  Surgeon: Maeola Harman, MD;  Location: [facility] OR;  Service: Vascular;  Laterality: Bilateral;  . PATCH ANGIOPLASTY  01/08/2017   Procedure: Patch Angioplasty Left Popliteal Artery ;  Surgeon: Maeola Harman, MD;  Location: [facility] OR;  Service: Vascular;;  . TRANSTHORACIC ECHOCARDIOGRAM  12/2016   Normal EF 60-65%.  Essentially normal echocardiogram.  No significant valvular lesions.    Current Medications: Current Meds  Medication Sig  . aspirin EC 81 MG tablet Take 81 mg by mouth daily.  Marland Kitchen atorvastatin (LIPITOR) 80 MG tablet Take 1 tablet (80 mg total) by mouth daily.  . busPIRone (BUSPAR) 15 MG tablet Take 2 tablets by mouth 3 (three) times daily.  . Cholecalciferol (VITAMIN D) 50 MCG (2000 UT) CAPS Take 2,000 Units by mouth daily.  Marland Kitchen ezetimibe (ZETIA) 10 MG tablet Take 10 mg by mouth daily.  Marland Kitchen gabapentin (NEURONTIN) 400 MG capsule Take 1,200 mg by mouth See admin instructions. 400mg  in the morning, and 800mg  at bedtime  . levothyroxine (SYNTHROID, LEVOTHROID) 50 MCG tablet Take 50 mcg by mouth daily before breakfast.  . omeprazole (PRILOSEC) 40 MG capsule 1 (one) time each day  . PARoxetine (PAXIL) 40 MG tablet Take 1 tablet by mouth daily.   PROLIA 60 MG/ML SOSY injection Inject 60 mg into the skin every 6 (six) months.  . QUEtiapine (SEROQUEL) 100 MG tablet Take 100-150 mg by mouth at bedtime.  . rivaroxaban (XARELTO) 2.5 MG TABS tablet Take 2.5 mg by mouth 2 (two) times daily.      Allergies:   Clindamycin/lincomycin   Social History   Socioeconomic History  . Marital status: Married    Spouse name: Not on file  . Number of children: Not on file  . Years of education: Not on file  . Highest education level: Not on file  Occupational History  . Not on file  Tobacco Use  . Smoking status: Former Smoker    Packs/day: 0.50    Years: 40.00    Pack years: 20.00    Types: Cigarettes    Quit date: 12/21/2016    Years since quitting: 4.1  . Smokeless tobacco: Never Used  Vaping Use  . Vaping Use: Never used  Substance and Sexual Activity  . Alcohol use: No  . Drug use: No  . Sexual activity: Not Currently  Other Topics Concern  . Not  on file  Social History Narrative   She is married. No kids. Currently disabled. No longer working. She still smokes at least a pack a day, but is hoping to quit with patches. Does not drink alcohol or use illicit drugs   Social Determinants of Health   Financial Resource Strain: Not on file  Food Insecurity: Not on file  Transportation Needs: Not on file  Physical Activity: Not on file  Stress: Not on file  Social Connections: Not on file     Family History: The patient's family history includes Cancer in her maternal grandmother; Heart disease in her maternal grandfather.  ROS:   Please see the history of present illness.    All other systems reviewed and are negative.  EKGs/Labs/Other Studies Reviewed:    The following studies were reviewed today: I discussed my findings with the patient at length EKG reveals sinus rhythm and nonspecific ST-T changes   Recent Labs: No results found for requested labs within last 8760 hours.  Recent Lipid Panel    Component Value Date/Time   CHOL 175 12/06/2017 1517   TRIG 250 (H) 12/06/2017 1517   HDL 40 12/06/2017 1517   CHOLHDL 4.4 12/06/2017 1517   LDLCALC 85 12/06/2017 1517    Physical Exam:    VS:  BP 140/78 (BP Location: Right Arm, Patient Position: Sitting,  Cuff Size: Normal)   Pulse 100   Ht 5\' 1"  (1.549 m)   Wt 127 lb (57.6 kg)   SpO2 95%   BMI 24.00 kg/m     Wt Readings from Last 3 Encounters:  02/26/21 127 lb (57.6 kg)  02/08/20 115 lb (52.2 kg)  01/01/20 115 lb (52.2 kg)     GEN: Patient is in no acute distress HEENT: Normal NECK: No JVD; No carotid bruits LYMPHATICS: No lymphadenopathy CARDIAC: Hear sounds regular, 2/6 systolic murmur at the apex. RESPIRATORY:  Clear to auscultation without rales, wheezing or rhonchi  ABDOMEN: Soft, non-tender, non-distended MUSCULOSKELETAL:  No edema; No deformity  SKIN: Warm and dry NEUROLOGIC:  Alert and oriented x 3 PSYCHIATRIC:  Normal affect   Signed, Garwin Brothersajan R Clydine Parkison, MD  02/26/2021 9:45 AM    [facility]

## 2021-02-26 NOTE — Patient Instructions (Signed)

## 2021-02-27 ENCOUNTER — Other Ambulatory Visit: Payer: Self-pay

## 2021-02-27 LAB — BASIC METABOLIC PANEL
BUN/Creatinine Ratio: 11 (ref 9–23)
BUN: 16 mg/dL (ref 6–24)
CO2: 19 mmol/L — ABNORMAL LOW (ref 20–29)
Calcium: 10.1 mg/dL (ref 8.7–10.2)
Chloride: 101 mmol/L (ref 96–106)
Creatinine, Ser: 1.51 mg/dL — ABNORMAL HIGH (ref 0.57–1.00)
Glucose: 161 mg/dL — ABNORMAL HIGH (ref 65–99)
Potassium: 4.2 mmol/L (ref 3.5–5.2)
Sodium: 142 mmol/L (ref 134–144)
eGFR: 40 mL/min/{1.73_m2} — ABNORMAL LOW (ref >59)

## 2021-02-27 LAB — CBC WITH DIFFERENTIAL/PLATELET
Basophils Absolute: 0.1 10*3/uL (ref 0.0–0.2)
Basos: 2 %
EOS (ABSOLUTE): 0.3 10*3/uL (ref 0.0–0.4)
Eos: 4 %
Hematocrit: 38.8 % (ref 34.0–46.6)
Hemoglobin: 13.1 g/dL (ref 11.1–15.9)
Immature Grans (Abs): 0.1 10*3/uL (ref 0.0–0.1)
Immature Granulocytes: 1 %
Lymphocytes Absolute: 2.3 10*3/uL (ref 0.7–3.1)
Lymphs: 31 %
MCH: 30.9 pg (ref 26.6–33.0)
MCHC: 33.8 g/dL (ref 31.5–35.7)
MCV: 92 fL (ref 79–97)
Monocytes Absolute: 0.5 10*3/uL (ref 0.1–0.9)
Monocytes: 7 %
Neutrophils Absolute: 4.1 10*3/uL (ref 1.4–7.0)
Neutrophils: 55 %
Platelets: 395 10*3/uL (ref 150–450)
RBC: 4.24 x10E6/uL (ref 3.77–5.28)
RDW: 12.5 % (ref 11.7–15.4)
WBC: 7.5 10*3/uL (ref 3.4–10.8)

## 2021-02-27 LAB — TSH: TSH: 7.21 u[IU]/mL — ABNORMAL HIGH (ref 0.450–4.500)

## 2021-02-27 LAB — LIPID PANEL
Chol/HDL Ratio: 6.1 ratio — ABNORMAL HIGH (ref 0.0–4.4)
Cholesterol, Total: 224 mg/dL — ABNORMAL HIGH (ref 100–199)
HDL: 37 mg/dL — ABNORMAL LOW (ref >39)
LDL Chol Calc (NIH): 112 mg/dL — ABNORMAL HIGH (ref 0–99)
Triglycerides: 432 mg/dL — ABNORMAL HIGH (ref 0–149)
VLDL Cholesterol Cal: 75 mg/dL — ABNORMAL HIGH (ref 5–40)

## 2021-02-27 LAB — HEPATIC FUNCTION PANEL
ALT: 42 IU/L — ABNORMAL HIGH (ref 0–32)
AST: 22 IU/L (ref 0–40)
Albumin: 4.4 g/dL (ref 3.8–4.9)
Alkaline Phosphatase: 105 IU/L (ref 44–121)
Bilirubin Total: <0.2 mg/dL (ref 0.0–1.2)
Bilirubin, Direct: <0.1 mg/dL (ref 0.00–0.40)
Total Protein: 7.5 g/dL (ref 6.0–8.5)

## 2021-02-27 MED ORDER — ATORVASTATIN CALCIUM 80 MG PO TABS: 80.0000 mg | ORAL_TABLET | Freq: Every day | ORAL | 3 refills | Status: DC

## 2021-02-27 MED ORDER — EZETIMIBE 10 MG PO TABS: 10.0000 mg | ORAL_TABLET | Freq: Every day | ORAL | 3 refills | Status: DC

## 2021-02-27 MED ORDER — ICOSAPENT ETHYL 1 G PO CAPS: 2.0000 g | ORAL_CAPSULE | Freq: Two times a day (BID) | ORAL | 3 refills | Status: DC

## 2021-02-27 NOTE — Addendum Note (Signed)
Addended by: Eleonore Chiquito on: 02/27/2021 03:55 PM   Modules accepted: Orders

## 2021-05-08 ENCOUNTER — Other Ambulatory Visit: Payer: Self-pay | Admitting: *Deleted

## 2021-05-08 DIAGNOSIS — I739 Peripheral vascular disease, unspecified: Secondary | ICD-10-CM

## 2021-06-04 ENCOUNTER — Other Ambulatory Visit: Payer: Self-pay

## 2021-06-04 ENCOUNTER — Ambulatory Visit (INDEPENDENT_AMBULATORY_CARE_PROVIDER_SITE_OTHER): Payer: Medicare HMO | Admitting: Physician Assistant

## 2021-06-04 ENCOUNTER — Ambulatory Visit (HOSPITAL_COMMUNITY)
Admission: RE | Admit: 2021-06-04 | Discharge: 2021-06-04 | Disposition: A | Discharge status: Home / Self Care | Payer: Medicare HMO | Source: Ambulatory Visit | Attending: Vascular Surgery | Admitting: Vascular Surgery

## 2021-06-04 VITALS — BP 112/73 | HR 87 | Temp 98.1°F | Resp 20 | Ht 61.0 in

## 2021-06-04 DIAGNOSIS — I739 Peripheral vascular disease, unspecified: Secondary | ICD-10-CM

## 2021-06-04 NOTE — Progress Notes (Signed)
Office Note     CC:  follow up Requesting Provider:  Hal Morales, NP  HPI: Alison Lynch is a 60 y.o. ([birth year]) female who presents for follow up of PAD. She has history of multiple lower extremity thromboembolectomies in 2018 by Dr. Randie Heinz followed by aortobifemoral bypass grafting in July of 2018 by Dr. Randie Heinz after presenting with bilateral occluded iliac stents. She had to subsequently undergo bilateral thrombectomy of ABF bypass in May of 2019.After this she developed wound complication with drainage from both groins, right greater than left and had to have bilateral groin wound exploration and debridement on 05/20/2018 by Dr. Arbie Cookey. She had been doing well since.  She was last seen in March of this year at which time she was not having any lower extremity symptoms. Overall her non invasive studies were stable. She was without rest pain, claudication or new wounds.  Today she presents for earlier follow up due to concerns for numbness and tingling in her right leg and foot. She says this has started to occur intermittently over the past month. She was recently diagnosed with Diabetes Mellitus. She reports that the numbness and tingling occurs both at night and also during the day. She often sits with her right leg bent and crossed over her left AKA prosthesis and she will notice that it " falls asleep". She will sort of massage it to wake it up and get tingling to go away. She otherwise denise symptoms of claudication, non healing wounds or rest pain involving the right LE. She does ambulate some with her AKA prosthesis but minimally. She admits that she does not exercise her right leg. She takes gabapentin for phantom pains but feels this does not really help her  The pt is on a statin for cholesterol management.    The pt is on an aspirin.    Other AC:  Xarelto The pt is not on meds for hypertension. The pt does have diabetes. Tobacco hx:  Former-quit 2018  Past Medical History:  Diagnosis  Date   Above knee amputation of left lower extremity (HCC) 01/13/2017   Acquired absence of left leg above knee (HCC) 01/13/2017   Acute kidney failure, unspecified (HCC) 06/18/2020   AKI (acute kidney injury) (HCC)    Anxiety 06/18/2020   Aortobifemoral bypass graft thrombosis, initial encounter (HCC) 04/19/2018   Aortoiliac occlusive disease (HCC) 05/30/2017   Arterial graft thrombosis (HCC) 04/19/2018   Arthrodesis status 05/18/2016   Atherosclerosis of aorta (HCC) 06/18/2020   Benign essential hypertension 06/18/2020   Formatting of this note might be different from the original. Last Assessment & Plan:  Formatting of this note might be different from the original. Clearly not hypertensive.  Not on any medications.   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 Last Assessment & Plan:  Formatting of this note might be different from the original. Followed by Dr.  Multiple surgeries.  Now doing well.  Trying to get up and going with her prosthesis.  we need to be following her lipids. Smoking cessation counseling.  Former heavy tobacco smoker 07/03/2017   GERD (gastroesophageal reflux disease) 11/16/2018   Hx of migraine headaches    Hyperlipidemia    Hyperlipidemia LDL goal <70 05/12/2017   Hypertension 11/16/2018   Hypothyroid    Hypothyroidism 11/16/2018   Ischemia of left lower extremity 01/08/2017   Formatting of this note might be different from the original. Last Assessment & Plan:  Formatting of this note might be different from the original. Status post left AKA, now with left lower extremity  ischemia. Pending aortobifem bypass. The urgent procedure.   Ischemia of lower extremity 01/08/2017   Leriche syndrome (HCC) 12/2016   Chronically occluded aorta   Leukocytosis 06/18/2020   Osteoporosis    PAD (peripheral artery disease) (HCC)    Status post left AKA for extensive thromboembolism, right leg has in-line flow through popliteal artery only.  Bilateral iliac stents occluded   Panic attacks    Peripheral artery disease (HCC) 05/12/2017   Peripheral vascular disease (HCC) 05/12/2017   Formatting of this note might be different from the original. Last Assessment & Plan:  Formatting of this note might be different from the original. At this point I want to let her recover from her surgery and her amputation.  She is asked to follow-up in the [place] point office.  My plan was for her to be seen in in 1year once our [place] office reopens.  She would need to be reassigned to a n   Phantom limb syndrome with pain (HCC) 06/18/2020   Pre-operative cardiovascular examination 05/12/2017   PTSD (post-traumatic stress disorder)    Pure hypertriglyceridemia 06/18/2020   S/P cervical spinal fusion 05/18/2016   Shortness of breath 11/16/2018   Overview:  exertional   Formatting of this note might be different from the original. exertional Formatting of this note might be different from the original. Formatting of this note might be different from the original. exertional Formatting of this note might be different from the original. Overview:  exertional   Stage 3 chronic kidney disease (HCC) 06/23/2020   Thrombocytopenia (HCC) 06/18/2020   Vitamin D deficiency, unspecified 06/23/2020   Wound infection 05/20/2018    Past Surgical History:  Procedure Laterality Date   ABDOMINAL AORTIC ANEURYSM REPAIR N/A 04/19/2018   Procedure: AORTIC EMBOLECTOMY AORTOGRAM;  Surgeon: Larina EarthlyEarly, Todd F, MD;  [facility];  Service: Vascular;  Laterality: N/A;   ABDOMINAL AORTOGRAM N/A 04/19/2018   Procedure: ABDOMINAL  AORTOGRAM;  Surgeon: Larina EarthlyEarly, Todd F, MD;  [facility];  Service: Vascular;  Laterality: N/A;   ABDOMINAL AORTOGRAM W/LOWER EXTREMITY Right 04/27/2017   Procedure: Abdominal Aortogram w/Lower Extremity;  Surgeon: Maeola Harmanain, Brandon Christopher, MD;  [facility];  Service: Cardiovascular;  Laterality: Right: Bilateral iliac stents occluded. Normal aortic flow with patent hypogastric and renal arteries. Femoral arteries patent by ultrasound; right leg and showed below-knee runoff -> plan Aortobifem Bypass   AMPUTATION Left 01/09/2017   Procedure: AMPUTATION ABOVE KNEE;  Surgeon: Maeola HarmanBrandon Christopher Cain, MD;  [facility];  Service: Vascular;  Laterality: Left;   AORTA - BILATERAL FEMORAL ARTERY BYPASS GRAFT N/A 05/30/2017   Procedure: AORTA BIFEMORAL BYPASS GRAFT USING [id number] HEMASHIELD GOLD GRAFT;  Surgeon: Maeola Harmanain, Brandon Christopher, MD;  [facility];  Service: Vascular;  Laterality: N/A;   AORTOGRAM Left 01/08/2017   Procedure: Ultrasound Guided Cannulation Left Common Femoral Artery; Aortagram;  Surgeon: Maeola HarmanBrandon Christopher Cain, MD;  [facility];  Service: Vascular;  Laterality: Left;   ARTERY EXPLORATION Left  01/08/2017   Procedure: Left Common Femoral Artery Exploration;  Surgeon: Maeola Harman, MD;  Location: [facility] OR;  Service: Vascular;  Laterality: Left;   BACK SURGERY     EMBOLECTOMY Left 01/08/2017   Procedure: Left Lower Extremity Thromboembolectomy;  Surgeon: Maeola Harman, MD;  Location: [facility] OR;  Service: Vascular;  Laterality: Left;   ENDARTERECTOMY POPLITEAL Left 01/08/2017   Procedure: Left Popliteal Endarterectomy;  Surgeon: Maeola Harman, MD;  Location: [facility] OR;  Service: Vascular;  Laterality: Left;   FEMORAL ARTERY EXPLORATION Bilateral 05/20/2018   Procedure: FEMORAL EXPLORATION, RIGHT;  Surgeon: Larina Earthly, MD;  Location: [facility] OR;  Service: Vascular;  Laterality: Bilateral;   INSERTION OF ILIAC STENT Bilateral 01/08/2017    Procedure: Bilateral Common Iliac Stent and Left Popliteal Artery Stent;  Surgeon: Maeola Harman, MD;  [facility] OR;  Service: Vascular;  Laterality: Bilateral;   LOWER EXTREMITY ANGIOGRAM Bilateral 01/08/2017   Procedure: Bilateral Lower Extremity Angiogram;  Surgeon: Maeola Harman, MD;  Location: [facility] OR;  Service: Vascular;  Laterality: Bilateral;   PATCH ANGIOPLASTY  01/08/2017   Procedure: Patch Angioplasty Left Popliteal Artery ;  Surgeon: Maeola Harman, MD;  Location: [facility] OR;  Service: Vascular;;   TRANSTHORACIC ECHOCARDIOGRAM  12/2016   Normal EF 60-65%.  Essentially normal echocardiogram.  No significant valvular lesions.    Social History   Socioeconomic History   Marital status: Married    Spouse name: Not on file   Number of children: Not on file   Years of education: Not on file   Highest education level: Not on file  Occupational History   Not on file  Tobacco Use   Smoking status: Former    Packs/day: 0.50    Years: 40.00    Pack years: 20.00    Types: Cigarettes    Quit date: 12/21/2016    Years since quitting: 4.4   Smokeless tobacco: Never  Vaping Use   Vaping Use: Never used  Substance and Sexual Activity   Alcohol use: No   Drug use: No   Sexual activity: Not Currently  Other Topics Concern   Not on file  Social History Narrative   She is married. No kids. Currently disabled. No longer working. She still smokes at least a pack a day, but is hoping to quit with patches. Does not drink alcohol or use illicit drugs   Social Determinants of Health   Financial Resource Strain: Not on file  Food Insecurity: Not on file  Transportation Needs: Not on file  Physical Activity: Not on file  Stress: Not on file  Social Connections: Not on file  Intimate Partner Violence: Not on file    Family History  Problem Relation Age of Onset   Cancer Maternal Grandmother    Heart disease Maternal Grandfather        He died at 14.  Unknown what heart disease    Current Outpatient Medications  Medication Sig Dispense Refill   aspirin EC 81 MG tablet Take 81 mg by mouth daily.     atorvastatin (LIPITOR) 80 MG tablet Take 1 tablet (80 mg total) by mouth daily. 90 tablet 3   busPIRone (BUSPAR) 15 MG tablet Take 2 tablets by mouth 3 (three) times daily.     Cholecalciferol (VITAMIN D) 50 MCG (2000 UT) CAPS Take 2,000 Units by mouth daily.     ezetimibe (ZETIA) 10 MG tablet Take 1 tablet (10 mg total) by mouth daily. 90 tablet 3  gabapentin (NEURONTIN) 400 MG capsule Take 1,200 mg by mouth See admin instructions.  in the morning, and  at bedtime     icosapent Ethyl (VASCEPA) 1 g capsule Take 2 capsules (2 g total) by mouth 2 (two) times daily. 360 capsule 3   levothyroxine (SYNTHROID, LEVOTHROID) 50 MCG tablet Take 50 mcg by mouth daily before breakfast.     metFORMIN (GLUCOPHAGE-XR) 500 MG 24 hr tablet SMARTSIG:1 Tablet(s) By Mouth Every Evening     omeprazole (PRILOSEC) 40 MG capsule 1 (one) time each day     PARoxetine (PAXIL) 40 MG tablet Take 1 tablet by mouth daily.      PROLIA 60 MG/ML SOSY injection Inject 60 mg into the skin every 6 (six) months.     QUEtiapine (SEROQUEL) 100 MG tablet Take 100-150 mg by mouth at bedtime.     rivaroxaban (XARELTO) 2.5 MG TABS tablet Take 2.5 mg by mouth 2 (two) times daily.     No current facility-administered medications for this visit.    Allergies  Allergen Reactions   Clindamycin/Lincomycin Rash     REVIEW OF SYSTEMS:   denotes positive finding,  denotes negative finding Cardiac  Comments:  Chest pain or chest pressure:    Shortness of breath upon exertion:    Short of breath when lying flat:    Irregular heart rhythm:        Vascular    Pain in calf, thigh, or hip brought on by ambulation:    Pain in feet at night that wakes you up from your sleep:     Blood clot in your veins:    Leg swelling:         Pulmonary    Oxygen at home:     Productive cough:     Wheezing:         Neurologic    Sudden weakness in arms or legs:     Sudden numbness in arms or legs:     Sudden onset of difficulty speaking or slurred speech:    Temporary loss of vision in one eye:     Problems with dizziness:         Gastrointestinal    Blood in stool:     Vomited blood:         Genitourinary    Burning when urinating:     Blood in urine:        Psychiatric    Major depression:         Hematologic    Bleeding problems:    Problems with blood clotting too easily:        Skin    Rashes or ulcers:        Constitutional    Fever or chills:      PHYSICAL EXAMINATION:  Vitals:   06/04/21 1135  BP: 112/73  Pulse: 87  Resp: 20  Temp: 98.1 F (36.7 C)  TempSrc: Temporal  SpO2: 95%  Height:  (1.549 m)    General:  WDWN in NAD; vital signs documented above Gait: Not observed, in wheel chair HENT: WNL, normocephalic Pulmonary: normal non-labored breathing , without wheezing Cardiac: regular HR, without  Murmurs without carotid bruit Abdomen: soft, NT, no masses Vascular Exam/Pulses:  Right Left  Radial 2+ (normal) 2+ (normal)  Femoral 2+ (normal) 2+ (normal)  Popliteal Not palpable AKA  DP Not palpable, monophasic doppler signal AKA  PT Not palpable, monophasic doppler signal AKA   Extremities: without ischemic changes,  without Gangrene , without cellulitis; without open wounds; trophic nail changes right foot. Right toes cool. Motor and sensation intact. Right foot warm Musculoskeletal: no muscle wasting or atrophy  Neurologic: A&O X 3;  No focal weakness or paresthesias are detected Psychiatric:  The pt has Normal affect.   Non-Invasive Vascular Imaging:  06/04/21    ABI Findings:  +---------+------------------+-----+----------+--------+  Right    Rt Pressure (mmHg)IndexWaveform  Comment   +---------+------------------+-----+----------+--------+  Brachial 119                                         +---------+------------------+-----+----------+--------+  PTA      53                0.40 monophasic          +---------+------------------+-----+----------+--------+  DP       50                0.38 monophasic          +---------+------------------+-----+----------+--------+  Great Toe39                0.30 Abnormal            +---------+------------------+-----+----------+--------+   +--------+------------------+-----+--------+-------+  Left    Lt Pressure (mmHg)IndexWaveformComment  +--------+------------------+-----+--------+-------+  [id number]                                     +--------+------------------+-----+--------+-------+   +-------+-----------+-----------+------------+------------+  ABI/TBIToday's ABIToday's TBIPrevious ABIPrevious TBI  +-------+-----------+-----------+------------+------------+  Right  0.40       0.30       0.75        0.63          +-------+-----------+-----------+------------+------------+  Left   BKA        -                                    +-------+-----------+-----------+------------+------------+     ASSESSMENT/PLAN:: 60 y.o. female here for follow up for PAD. She has history of multiple lower extremity thromboembolectomies in 2018 by Dr. Randie Heinz followed by aortobifemoral bypass grafting in July of 2018 by Dr. Randie Heinz after presenting with bilateral occluded iliac stents. She had to subsequently undergo bilateral thrombectomy of ABF bypass in May of 2019. After this she developed wound complication with drainage from both groins, right greater than left and had to have bilateral groin wound exploration and debridement on 05/20/2018 by Dr. Arbie Cookey. She has been doing well since. - She has some new numbness and tingling in right leg intermittently. I think this is likely a combination of nerve compression with positioning, tibial disease and diabetic neuropathy. I advised her to try to not sit with her leg  crossed over her prosthesis - She has no rest pain, tissue loss or claudication - Her ABI has decreased on the RLE but would not recommend any intervention at this time - I have encouraged her to try to exercise her right leg as much as she can - She will follow up sooner should she develop rest pain or tissue loss - she will continue her Aspirin, Statin, and Xarelto - She will return in 6 months with ABI   Graceann Congress, PA-C [facility] [phone]  Clinic  MD:  Veda Canning

## 2021-06-05 ENCOUNTER — Other Ambulatory Visit: Payer: Self-pay

## 2021-06-05 DIAGNOSIS — I739 Peripheral vascular disease, unspecified: Secondary | ICD-10-CM

## 2021-06-05 NOTE — Progress Notes (Signed)
error 

## 2021-06-16 DIAGNOSIS — Z8669 Personal history of other diseases of the nervous system and sense organs: Secondary | ICD-10-CM | POA: Insufficient documentation

## 2021-06-16 HISTORY — DX: Personal history of other diseases of the nervous system and sense organs: Z86.69

## 2021-08-27 ENCOUNTER — Other Ambulatory Visit: Payer: Self-pay

## 2021-08-31 ENCOUNTER — Ambulatory Visit (INDEPENDENT_AMBULATORY_CARE_PROVIDER_SITE_OTHER): Payer: Medicare HMO | Admitting: Cardiology

## 2021-08-31 ENCOUNTER — Encounter: Payer: Self-pay | Admitting: Cardiology

## 2021-08-31 ENCOUNTER — Other Ambulatory Visit: Payer: Self-pay

## 2021-08-31 VITALS — BP 134/74 | HR 100 | Ht 65.0 in | Wt 119.4 lb

## 2021-08-31 DIAGNOSIS — I700 Atherosclerosis of aorta: Secondary | ICD-10-CM | POA: Diagnosis not present

## 2021-08-31 DIAGNOSIS — I739 Peripheral vascular disease, unspecified: Secondary | ICD-10-CM | POA: Diagnosis not present

## 2021-08-31 DIAGNOSIS — I10 Essential (primary) hypertension: Secondary | ICD-10-CM | POA: Diagnosis not present

## 2021-08-31 DIAGNOSIS — E785 Hyperlipidemia, unspecified: Secondary | ICD-10-CM | POA: Diagnosis not present

## 2021-08-31 DIAGNOSIS — R079 Chest pain, unspecified: Secondary | ICD-10-CM

## 2021-08-31 DIAGNOSIS — R072 Precordial pain: Secondary | ICD-10-CM

## 2021-08-31 DIAGNOSIS — N183 Chronic kidney disease, stage 3 unspecified: Secondary | ICD-10-CM

## 2021-08-31 HISTORY — DX: Chest pain, unspecified: R07.9

## 2021-08-31 MED ORDER — NITROGLYCERIN 0.4 MG SL SUBL: 0.4000 mg | SUBLINGUAL_TABLET | SUBLINGUAL | 6 refills | Status: DC | PRN

## 2021-08-31 NOTE — Patient Instructions (Signed)
Medication Instructions:  Your physician has recommended you make the following change in your medication:   Use nitroglycerin 1 tablet placed under the tongue at the first sign of chest pain or an angina attack. 1 tablet may be used every 5 minutes as needed, for up to 15 minutes. Do not take more than 3 tablets in 15 minutes. If pain persist call 911 or go to the nearest ED.   *If you need a refill on your cardiac medications before your next appointment, please call your pharmacy*   Lab Work: Your physician recommends that you return for lab work in: the next few days. You need to have labs done when you are fasting.  You can come Monday through Friday 8:30 am to 12:00 pm and 1:15 to 4:30. You do not need to make an appointment as the order has already been placed. The labs you are going to have done are BMET,  LFT and Lipids.  If you have labs (blood work) drawn today and your tests are completely normal, you will receive your results only by: MyChart Message (if you have MyChart) OR A paper copy in the mail If you have any lab test that is abnormal or we need to change your treatment, we will call you to review the results.   Testing/Procedures: Your physician has requested that you have a lexiscan myoview. For further information please visit [email or web address] Please follow instruction sheet, as given.  The test will take approximately 3 to 4 hours to complete; you may bring reading material.  If someone comes with you to your appointment, they will need to remain in the main lobby due to limited space in the testing area. **If you are pregnant or breastfeeding, please notify the nuclear lab prior to your appointment**  How to prepare for your Myocardial Perfusion Test: Do not eat or drink 3 hours prior to your test, except you may have water. Do not consume products containing caffeine (regular or decaffeinated) 12 hours prior to your test. (ex: coffee, chocolate, sodas, tea). Do  bring a list of your current medications with you.  If not listed below, you may take your medications as normal. Do wear comfortable clothes (no dresses or overalls) and walking shoes, tennis shoes preferred (No heels or open toe shoes are allowed). Do NOT wear cologne, perfume, aftershave, or lotions (deodorant is allowed). If these instructions are not followed, your test will have to be rescheduled.    Follow-Up: At [facility], you and your health needs are our priority.  As part of our continuing mission to provide you with exceptional heart care, we have created designated Provider Care Teams.  These Care Teams include your primary Cardiologist (physician) and Advanced Practice Providers (APPs -  Physician Assistants and Nurse Practitioners) who all work together to provide you with the care you need, when you need it.  We recommend signing up for the patient portal called "MyChart".  Sign up information is provided on this After Visit Summary.  MyChart is used to connect with patients for Virtual Visits (Telemedicine).  Patients are able to view lab/test results, encounter notes, upcoming appointments, etc.  Non-urgent messages can be sent to your provider as well.   To learn more about what you can do with MyChart, go to [email or web address].    Your next appointment:   6 month(s)  The format for your next appointment:   In Person  Provider:   Belva Crome, MD   Other Instructions Cardiac Nuclear  Scan A cardiac nuclear scan is a test that is done to check the flow of blood to your heart. It is done when you are resting and when you are exercising. The test looks for problems such as: Not enough blood reaching a portion of the heart. The heart muscle not working as it should. You may need this test if: You have heart disease. You have had lab results that are not normal. You have had heart surgery or a balloon procedure to open up blocked arteries (angioplasty). You  have chest pain. You have shortness of breath. In this test, a special dye (tracer) is put into your bloodstream. The tracer will travel to your heart. A camera will then take pictures of your heart to see how the tracer moves through your heart. This test is usually done at a hospital and takes 2-4 hours. Tell a doctor about: Any allergies you have. All medicines you are taking, including vitamins, herbs, eye drops, creams, and over-the-counter medicines. Any problems you or family members have had with anesthetic medicines. Any blood disorders you have. Any surgeries you have had. Any medical conditions you have. Whether you are pregnant or may be pregnant. What are the risks? Generally, this is a safe test. However, problems may occur, such as: Serious chest pain and heart attack. This is only a risk if the stress portion of the test is done. Rapid heartbeat. A feeling of warmth in your chest. This feeling usually does not last long. Allergic reaction to the tracer. What happens before the test? Ask your doctor about changing or stopping your normal medicines. This is important. Follow instructions from your doctor about what you cannot eat or drink. Remove your jewelry on the day of the test. What happens during the test? An IV tube will be inserted into one of your veins. Your doctor will give you a small amount of tracer through the IV tube. You will wait for 20-40 minutes while the tracer moves through your bloodstream. Your heart will be monitored with an electrocardiogram (ECG). You will lie down on an exam table. Pictures of your heart will be taken for about 15-20 minutes. You may also have a stress test. For this test, one of these things may be done: You will be asked to exercise on a treadmill or a stationary bike. You will be given medicines that will make your heart work harder. This is done if you are unable to exercise. When blood flow to your heart has peaked, a  tracer will again be given through the IV tube. After 20-40 minutes, you will get back on the exam table. More pictures will be taken of your heart. Depending on the tracer that is used, more pictures may need to be taken 3-4 hours later. Your IV tube will be removed when the test is over. The test may vary among doctors and hospitals. What happens after the test? Ask your doctor: Whether you can return to your normal schedule, including diet, activities, and medicines. Whether you should drink more fluids. This will help to remove the tracer from your body. Drink enough fluid to keep your pee (urine) pale yellow. Ask your doctor, or the department that is doing the test: When will my results be ready? How will I get my results? Summary A cardiac nuclear scan is a test that is done to check the flow of blood to your heart. Tell your doctor whether you are pregnant or may be pregnant. Before the  test, ask your doctor about changing or stopping your normal medicines. This is important. Ask your doctor whether you can return to your normal activities. You may be asked to drink more fluids. This information is not intended to replace advice given to you by your health care provider. Make sure you discuss any questions you have with your health care provider. Document Revised: 03/07/2019 Document Reviewed: 05/01/2018 Elsevier Patient Education  2021 [employer].

## 2021-08-31 NOTE — Progress Notes (Signed)
Cardiology Office Note:    Date:  08/31/2021   ID:  Alison Lynch, DOB March 06, 1961, MRN [id number]  PCP:  Hal Morales, NP  Cardiologist:  Garwin Brothers, MD   Referring MD: Hal Morales, NP    ASSESSMENT:    1. Peripheral artery disease (HCC)   2. Hyperlipidemia LDL goal <70   3. Benign essential hypertension   4. Atherosclerosis of aorta (HCC)   5. Stage 3 chronic kidney disease, unspecified whether stage 3a or 3b CKD (HCC)   6. Chest pain of uncertain etiology    PLAN:    In order of problems listed above:  Atherosclerotic vascular disease: Chest pain: Patient symptoms are not typical for coronary etiology however in view of risk factors and her general condition she would benefit from an evaluation for objective evidence of obstructive coronary artery disease and she is agreeable.  We will schedule her for Lexiscan sestamibi.  Sublingual nitroglycerin prescription was sent, its protocol and 911 protocol explained and the patient vocalized understanding questions were answered to the patient's satisfaction Mixed dyslipidemia: Lipids are markedly elevated in the past.  She will be back in the next few days for follow-up lipids ordered.  She is doing better with diet. Diabetes mellitus type 2 and peripheral vascular disease: Stable at this time.  Diet emphasized.  Renal insufficiency: Followed by primary care physician and nephrology. Patient will be seen in follow-up appointment in 6 months or earlier if the patient has any concerns    Medication Adjustments/Labs and Tests Ordered: Current medicines are reviewed at length with the patient today.  Concerns regarding medicines are outlined above.  No orders of the defined types were placed in this encounter.  No orders of the defined types were placed in this encounter.    No chief complaint on file.    History of Present Illness:    Alison Lynch is a 60 y.o. female.  Patient has past medical history of peripheral  vascular disease, atherosclerotic aorta, essential hypertension dyslipidemia and renal insufficiency.  She is post above-knee amputation of the lower extremity on the left.  She denies any problems at this time.  She tells me that a few weeks ago she episode of chest tightness when she was at church.  No radiation to the neck of the arms.  At the time of my evaluation, the patient is alert awake oriented and in no distress.  Subsequently she has not had such issues.  She is wanting an evaluation.  Past Medical History:  Diagnosis Date   Above knee amputation of left lower extremity (HCC) 01/13/2017   Acquired absence of left leg above knee (HCC) 01/13/2017   Acute kidney failure, unspecified (HCC) 06/18/2020   Acute renal failure syndrome (HCC) 06/18/2020   AKI (acute kidney injury) (HCC)    Anxiety 06/18/2020   Aortobifemoral bypass graft thrombosis, initial encounter (HCC) 04/19/2018   Aortoiliac occlusive disease (HCC) 05/30/2017   Arterial graft thrombosis (HCC) 04/19/2018   Arthrodesis status 05/18/2016   Atherosclerosis of aorta (HCC) 06/18/2020   Benign essential hypertension 06/18/2020   Formatting of this note might be different from the original. Last Assessment & Plan:  Formatting of this note might be different from the original. Clearly not hypertensive.  Not on any medications.   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 Last Assessment & Plan:  Formatting of this note might be different from the original. Followed by Dr.  Multiple surgeries.  Now doing well.  Trying to get up and going with  her prosthesis.  we need to be following her lipids. Smoking cessation counseling.   Former heavy tobacco smoker 07/03/2017   GERD (gastroesophageal reflux disease) 11/16/2018   Hx of migraine headaches    Hyperlipidemia    Hyperlipidemia LDL goal <70 05/12/2017   Hypertension 11/16/2018   Hypothyroid    Hypothyroidism 11/16/2018   Ischemia of left lower extremity 01/08/2017   Formatting of this note might be different from the original. Last Assessment & Plan:  Formatting of this note might be different from the original. Status post left AKA, now with left lower extremity ischemia. Pending aortobifem bypass. The urgent procedure.   Ischemia of lower extremity 01/08/2017   Leriche syndrome (HCC) 12/2016   Chronically occluded aorta   Leukocytosis 06/18/2020   Osteoporosis    PAD (peripheral artery disease) (HCC)    Status post left AKA for extensive thromboembolism, right leg has in-line flow through popliteal artery only.  Bilateral iliac stents occluded   Panic attacks    Peripheral artery disease (HCC) 05/12/2017   Peripheral vascular disease (HCC) 05/12/2017   Formatting of this note might be different from the original. Last Assessment & Plan:  Formatting of this note might be different from the original. At this point I want to let her recover from her surgery and her amputation.  She is asked to follow-up in the [place] point office.  My plan was for her to be seen in in 1year once our [place] office reopens.  She would need to be reassigned to a n   Personal history of other diseases of the nervous system and sense organs 06/16/2021   Phantom limb syndrome with pain (HCC) 06/18/2020   Pre-operative cardiovascular examination 05/12/2017   PTSD (post-traumatic stress disorder)    Pure hypertriglyceridemia 06/18/2020   S/P cervical spinal fusion 05/18/2016   Shortness of breath 11/16/2018   Overview:  exertional   Formatting of this note might be different from the original. exertional  Formatting of this note might be different from the original. Formatting of this note might be different from the original. exertional Formatting of this note might be different from the original. Overview:  exertional   Stage 3 chronic kidney disease (HCC) 06/23/2020   Thrombocytopenia (HCC) 06/18/2020   Type 2 diabetes mellitus with other specified complication (HCC) 11/16/2018   Vitamin D deficiency, unspecified 06/23/2020   Wound infection 05/20/2018    Past Surgical History:  Procedure Laterality Date   ABDOMINAL AORTIC ANEURYSM REPAIR N/A 04/19/2018   Procedure: AORTIC EMBOLECTOMY AORTOGRAM;  Surgeon: Larina Earthly, MD;  Location: [facility] OR;  Service: Vascular;  Laterality: N/A;   ABDOMINAL AORTOGRAM N/A 04/19/2018   Procedure: ABDOMINAL AORTOGRAM;  Surgeon: Larina Earthly, MD;  Location: [facility] OR;  Service: Vascular;  Laterality: N/A;   ABDOMINAL AORTOGRAM W/LOWER EXTREMITY Right 04/27/2017   Procedure: Abdominal Aortogram w/Lower Extremity;  Surgeon: Maeola Harman, MD;  Location: [facility] INVASIVE CV LAB;  Service: Cardiovascular;  Laterality: Right: Bilateral iliac stents occluded. Normal aortic flow  with patent hypogastric and renal arteries. Femoral arteries patent by ultrasound; right leg and showed below-knee runoff -> plan Aortobifem Bypass   AMPUTATION Left 01/09/2017   Procedure: AMPUTATION ABOVE KNEE;  Surgeon: Maeola Harman, MD;  Location: [facility] OR;  Service: Vascular;  Laterality: Left;   AORTA - BILATERAL FEMORAL ARTERY BYPASS GRAFT N/A 05/30/2017   Procedure: AORTA BIFEMORAL BYPASS GRAFT USING [id number] HEMASHIELD GOLD GRAFT;  Surgeon: Maeola Harman, MD;  Location: [facility] OR;  Service: Vascular;  Laterality: N/A;   AORTOGRAM Left 01/08/2017   Procedure: Ultrasound Guided Cannulation Left Common Femoral Artery; Aortagram;  Surgeon: Maeola Harman, MD;  Location: [facility] OR;  Service: Vascular;  Laterality: Left;   ARTERY EXPLORATION Left 01/08/2017   Procedure:  Left Common Femoral Artery Exploration;  Surgeon: Maeola Harman, MD;  Location: [facility] OR;  Service: Vascular;  Laterality: Left;   BACK SURGERY     EMBOLECTOMY Left 01/08/2017   Procedure: Left Lower Extremity Thromboembolectomy;  Surgeon: Maeola Harman, MD;  Location: [facility] OR;  Service: Vascular;  Laterality: Left;   ENDARTERECTOMY POPLITEAL Left 01/08/2017   Procedure: Left Popliteal Endarterectomy;  Surgeon: Maeola Harman, MD;  Location: [facility] OR;  Service: Vascular;  Laterality: Left;   FEMORAL ARTERY EXPLORATION Bilateral 05/20/2018   Procedure: FEMORAL EXPLORATION, RIGHT;  Surgeon: Larina Earthly, MD;  Location: [facility] OR;  Service: Vascular;  Laterality: Bilateral;   INSERTION OF ILIAC STENT Bilateral 01/08/2017   Procedure: Bilateral Common Iliac Stent and Left Popliteal Artery Stent;  Surgeon: Maeola Harman, MD;  Location: [facility] OR;  Service: Vascular;  Laterality: Bilateral;   LOWER EXTREMITY ANGIOGRAM Bilateral 01/08/2017   Procedure: Bilateral Lower Extremity Angiogram;  Surgeon: Maeola Harman, MD;  Location: [facility] OR;  Service: Vascular;  Laterality: Bilateral;   PATCH ANGIOPLASTY  01/08/2017   Procedure: Patch Angioplasty Left Popliteal Artery ;  Surgeon: Maeola Harman, MD;  Location: [facility] OR;  Service: Vascular;;   TRANSTHORACIC ECHOCARDIOGRAM  12/2016   Normal EF 60-65%.  Essentially normal echocardiogram.  No significant valvular lesions.    Current Medications: Current Meds  Medication Sig   aspirin EC 81 MG tablet Take 81 mg by mouth daily.   atorvastatin (LIPITOR) 80 MG tablet Take 1 tablet (80 mg total) by mouth daily.   busPIRone (BUSPAR) 15 MG tablet Take 2 tablets by mouth 3 (three) times daily.   ezetimibe (ZETIA) 10 MG tablet Take 1 tablet (10 mg total) by mouth daily.   gabapentin (NEURONTIN) 400 MG capsule Take 400 mg by mouth in the morning. And take 800 mg at bedtime   icosapent Ethyl (VASCEPA) 1 g capsule Take 2 capsules  (2 g total) by mouth 2 (two) times daily.   levothyroxine (SYNTHROID, LEVOTHROID) 50 MCG tablet Take 50 mcg by mouth daily before breakfast.   metFORMIN (GLUCOPHAGE-XR) 500 MG 24 hr tablet Take 1 tablet by mouth in the morning.   omeprazole (PRILOSEC) 40 MG capsule Take 40 mg by mouth daily.   PARoxetine (PAXIL) 40 MG tablet Take 1 tablet by mouth daily.    PROLIA 60 MG/ML SOSY injection Inject 60 mg into the skin every 6 (six) months.   QUEtiapine (SEROQUEL) 100 MG tablet Take 100-150 mg by mouth at bedtime.   rivaroxaban (XARELTO) 2.5 MG TABS tablet Take 2.5 mg by mouth 2 (two) times daily.     Allergies:   Clindamycin/lincomycin   Social History   Socioeconomic History   Marital status: Married  Spouse name: Not on file   Number of children: Not on file   Years of education: Not on file   Highest education level: Not on file  Occupational History   Not on file  Tobacco Use   Smoking status: Former    Packs/day: 0.50    Years: 40.00    Pack years: 20.00    Types: Cigarettes    Quit date: 12/21/2016    Years since quitting: 4.6   Smokeless tobacco: Never  Vaping Use   Vaping Use: Never used  Substance and Sexual Activity   Alcohol use: No   Drug use: No   Sexual activity: Not Currently  Other Topics Concern   Not on file  Social History Narrative   She is married. No kids. Currently disabled. No longer working. She still smokes at least a pack a day, but is hoping to quit with patches. Does not drink alcohol or use illicit drugs   Social Determinants of Health   Financial Resource Strain: Not on file  Food Insecurity: Not on file  Transportation Needs: Not on file  Physical Activity: Not on file  Stress: Not on file  Social Connections: Not on file     Family History: The patient's family history includes Cancer in her maternal grandmother; Heart disease in her maternal grandfather.  ROS:   Please see the history of present illness.    All other systems  reviewed and are negative.  EKGs/Labs/Other Studies Reviewed:    The following studies were reviewed today: I discussed my findings with the patient at length.   Recent Labs: 02/26/2021: ALT 42; BUN 16; Creatinine, Ser 1.51; Hemoglobin 13.1; Platelets 395; Potassium 4.2; Sodium 142; TSH 7.210  Recent Lipid Panel    Component Value Date/Time   CHOL 224 (H) 02/26/2021 0950   TRIG 432 (H) 02/26/2021 0950   HDL 37 (L) 02/26/2021 0950   CHOLHDL 6.1 (H) 02/26/2021 0950   LDLCALC 112 (H) 02/26/2021 0950    Physical Exam:    VS:  BP 134/74   Pulse 100   Ht 5\' 5"  (1.651 m)   Wt 119 lb 6.4 oz (54.2 kg)   SpO2 97%   BMI 19.87 kg/m     Wt Readings from Last 3 Encounters:  08/31/21 119 lb 6.4 oz (54.2 kg)  02/26/21 127 lb (57.6 kg)  02/08/20 115 lb (52.2 kg)     GEN: Patient is in no acute distress HEENT: Normal NECK: No JVD; No carotid bruits LYMPHATICS: No lymphadenopathy CARDIAC: Hear sounds regular, 2/6 systolic murmur at the apex. RESPIRATORY:  Clear to auscultation without rales, wheezing or rhonchi  ABDOMEN: Soft, non-tender, non-distended MUSCULOSKELETAL:  No edema; No deformity  SKIN: Warm and dry NEUROLOGIC:  Alert and oriented x 3 PSYCHIATRIC:  Normal affect   Signed, 04/09/20, MD  08/31/2021 2:26 PM    [facility]

## 2021-08-31 NOTE — Addendum Note (Signed)
Addended by: Eleonore Chiquito on: 08/31/2021 02:37 PM   Modules accepted: Orders

## 2021-09-02 ENCOUNTER — Telehealth (HOSPITAL_COMMUNITY): Payer: Self-pay | Admitting: *Deleted

## 2021-09-02 NOTE — Telephone Encounter (Signed)
Left message on voicemail per DPR in reference to upcoming appointment scheduled on 09/03/21 at 8:00 with detailed instructions given per Myocardial Perfusion Study Information Sheet for the test. LM to arrive 15 minutes early, and that it is imperative to arrive on time for appointment to keep from having the test rescheduled. If you need to cancel or reschedule your appointment, please call the office within 24 hours of your appointment. Failure to do so may result in a cancellation of your appointment, and a $50 no show fee. Phone number given for call back for any questions.

## 2021-09-03 ENCOUNTER — Ambulatory Visit (INDEPENDENT_AMBULATORY_CARE_PROVIDER_SITE_OTHER): Payer: Medicare HMO

## 2021-09-03 ENCOUNTER — Other Ambulatory Visit: Payer: Self-pay

## 2021-09-03 DIAGNOSIS — R072 Precordial pain: Secondary | ICD-10-CM | POA: Diagnosis not present

## 2021-09-03 DIAGNOSIS — E785 Hyperlipidemia, unspecified: Secondary | ICD-10-CM | POA: Diagnosis not present

## 2021-09-03 DIAGNOSIS — I10 Essential (primary) hypertension: Secondary | ICD-10-CM | POA: Diagnosis not present

## 2021-09-03 DIAGNOSIS — N183 Chronic kidney disease, stage 3 unspecified: Secondary | ICD-10-CM

## 2021-09-03 DIAGNOSIS — I700 Atherosclerosis of aorta: Secondary | ICD-10-CM

## 2021-09-03 DIAGNOSIS — I739 Peripheral vascular disease, unspecified: Secondary | ICD-10-CM

## 2021-09-03 DIAGNOSIS — R079 Chest pain, unspecified: Secondary | ICD-10-CM

## 2021-09-03 MED ORDER — AMINOPHYLLINE 25 MG/ML IV SOLN
75.0000 mg | Freq: Once | INTRAVENOUS | Status: AC
  Administered 2021-09-03: 75 mg via INTRAVENOUS

## 2021-09-03 MED ORDER — TECHNETIUM TC 99M TETROFOSMIN IV KIT
32.9000 | PACK | Freq: Once | INTRAVENOUS | Status: AC | PRN
  Administered 2021-09-03: 32.9 via INTRAVENOUS

## 2021-09-03 MED ORDER — TECHNETIUM TC 99M TETROFOSMIN IV KIT
10.3000 | PACK | Freq: Once | INTRAVENOUS | Status: AC | PRN
  Administered 2021-09-03: 11 via INTRAVENOUS

## 2021-09-03 MED ORDER — REGADENOSON 0.4 MG/5ML IV SOLN
0.4000 mg | Freq: Once | INTRAVENOUS | Status: AC
  Administered 2021-09-03: 0.4 mg via INTRAVENOUS

## 2021-09-04 LAB — BASIC METABOLIC PANEL
BUN/Creatinine Ratio: 7 — ABNORMAL LOW (ref 9–23)
BUN: 13 mg/dL (ref 6–24)
CO2: 20 mmol/L (ref 20–29)
Calcium: 9.1 mg/dL (ref 8.7–10.2)
Chloride: 101 mmol/L (ref 96–106)
Creatinine, Ser: 1.75 mg/dL — ABNORMAL HIGH (ref 0.57–1.00)
Glucose: 106 mg/dL — ABNORMAL HIGH (ref 70–99)
Potassium: 4 mmol/L (ref 3.5–5.2)
Sodium: 139 mmol/L (ref 134–144)
eGFR: 33 mL/min/{1.73_m2} — ABNORMAL LOW (ref >59)

## 2021-09-04 LAB — LIPID PANEL
Chol/HDL Ratio: 7.8 ratio — ABNORMAL HIGH (ref 0.0–4.4)
Cholesterol, Total: 296 mg/dL — ABNORMAL HIGH (ref 100–199)
HDL: 38 mg/dL — ABNORMAL LOW (ref >39)
LDL Chol Calc (NIH): 194 mg/dL — ABNORMAL HIGH (ref 0–99)
Triglycerides: 322 mg/dL — ABNORMAL HIGH (ref 0–149)
VLDL Cholesterol Cal: 64 mg/dL — ABNORMAL HIGH (ref 5–40)

## 2021-09-04 LAB — MYOCARDIAL PERFUSION IMAGING
LV dias vol: 18 mL (ref 46–106)
LV sys vol: 3 mL
Nuc Stress EF: 81 %
Peak HR: 111 {beats}/min
Rest HR: 86 {beats}/min
Rest Nuclear Isotope Dose: 11 mCi
SDS: 0
SRS: 8
SSS: 8
Stress Nuclear Isotope Dose: 32.9 mCi
TID: 0.75

## 2021-09-04 LAB — HEPATIC FUNCTION PANEL
ALT: 54 IU/L — ABNORMAL HIGH (ref 0–32)
AST: 30 IU/L (ref 0–40)
Albumin: 4.8 g/dL (ref 3.8–4.9)
Alkaline Phosphatase: 102 IU/L (ref 44–121)
Bilirubin Total: 0.3 mg/dL (ref 0.0–1.2)
Bilirubin, Direct: <0.1 mg/dL (ref 0.00–0.40)
Total Protein: 7.8 g/dL (ref 6.0–8.5)

## 2021-09-04 MED ORDER — NEXLIZET 180-10 MG PO TABS: 1.0000 | ORAL_TABLET | Freq: Every day | ORAL | 12 refills | Status: DC

## 2021-09-04 NOTE — Addendum Note (Signed)
Addended by: Eleonore Chiquito on: 09/04/2021 12:52 PM   Modules accepted: Orders

## 2021-09-04 NOTE — Addendum Note (Signed)
Addended by: Eleonore Chiquito on: 09/04/2021 12:48 PM   Modules accepted: Orders

## 2021-09-04 NOTE — Addendum Note (Signed)
Addended by: Eleonore Chiquito on: 09/04/2021 12:47 PM   Modules accepted: Orders

## 2021-09-22 ENCOUNTER — Telehealth: Payer: Self-pay

## 2021-09-22 LAB — HEPATIC FUNCTION PANEL
ALT: 31 IU/L (ref 0–32)
AST: 23 IU/L (ref 0–40)
Albumin: 4.7 g/dL (ref 3.8–4.9)
Alkaline Phosphatase: 108 IU/L (ref 44–121)
Bilirubin Total: <0.2 mg/dL (ref 0.0–1.2)
Bilirubin, Direct: <0.1 mg/dL (ref 0.00–0.40)
Total Protein: 7.8 g/dL (ref 6.0–8.5)

## 2021-09-22 LAB — LIPID PANEL
Chol/HDL Ratio: 4.2 ratio (ref 0.0–4.4)
Cholesterol, Total: 165 mg/dL (ref 100–199)
HDL: 39 mg/dL — ABNORMAL LOW (ref >39)
LDL Chol Calc (NIH): 95 mg/dL (ref 0–99)
Triglycerides: 176 mg/dL — ABNORMAL HIGH (ref 0–149)
VLDL Cholesterol Cal: 31 mg/dL (ref 5–40)

## 2021-09-22 NOTE — Telephone Encounter (Signed)
Spoke with patient regarding results and recommendation.  Patient verbalizes understanding and is agreeable to plan of care. Advised patient to call back with any issues or concerns.  

## 2021-09-22 NOTE — Telephone Encounter (Signed)
-----   Message from Garwin Brothers, MD sent at 09/22/2021 12:27 PM EDT ----- The results of the study is unremarkable. Please inform patient. I will discuss in detail at next appointment. Cc  primary care/referring physician Garwin Brothers, MD 09/22/2021 12:27 PM

## 2021-11-06 ENCOUNTER — Telehealth: Payer: Self-pay | Admitting: Cardiology

## 2021-11-06 DIAGNOSIS — I700 Atherosclerosis of aorta: Secondary | ICD-10-CM

## 2021-11-06 DIAGNOSIS — R072 Precordial pain: Secondary | ICD-10-CM

## 2021-11-06 DIAGNOSIS — R079 Chest pain, unspecified: Secondary | ICD-10-CM

## 2021-11-06 MED ORDER — NITROGLYCERIN 0.4 MG SL SUBL: 0.4000 mg | SUBLINGUAL_TABLET | SUBLINGUAL | 6 refills | Status: DC | PRN

## 2021-11-06 NOTE — Telephone Encounter (Signed)
RX sent

## 2021-11-06 NOTE — Telephone Encounter (Signed)
*  STAT* If patient is at the pharmacy, call can be transferred to refill team.   1. Which medications need to be refilled? (please list name of each medication and dose if known) nitroGLYCERIN (NITROSTAT) 0.4 MG SL tablet  2. Which pharmacy/location (including street and city if local pharmacy) is medication to be sent to?[facility] - [place], [place] - [address]  3. Do they need a 30 day or 90 day supply? 30 day

## 2021-11-13 ENCOUNTER — Ambulatory Visit: Payer: Medicare HMO | Admitting: Dietician

## 2021-11-16 ENCOUNTER — Ambulatory Visit: Payer: Medicare HMO | Admitting: Dietician

## 2021-12-01 ENCOUNTER — Other Ambulatory Visit: Payer: Self-pay

## 2022-02-17 ENCOUNTER — Encounter (HOSPITAL_COMMUNITY): Payer: Medicare HMO

## 2022-02-17 ENCOUNTER — Ambulatory Visit: Payer: Medicare HMO

## 2022-02-22 LAB — HM DIABETES EYE EXAM

## 2022-02-26 ENCOUNTER — Other Ambulatory Visit: Payer: Self-pay | Admitting: *Deleted

## 2022-02-26 DIAGNOSIS — I779 Disorder of arteries and arterioles, unspecified: Secondary | ICD-10-CM

## 2022-02-26 DIAGNOSIS — I739 Peripheral vascular disease, unspecified: Secondary | ICD-10-CM

## 2022-03-01 NOTE — Progress Notes (Signed)
?Office Note  ? ? ? ?CC:  follow up ?Requesting Provider:  Charlynn Court, NP ? ?HPI: Alison Lynch is a 61 y.o. (11-15-1961) female who presents for follow up of  PAD. She has history of multiple lower extremity thromboembolectomies in 2018 by Dr. Donzetta Matters followed by aortobifemoral bypass grafting in July of 2018 by Dr. Donzetta Matters after presenting with bilateral occluded iliac stents. She had to subsequently undergo bilateral thrombectomy of ABF bypass in May of 2019.After this she developed wound complication with drainage from both groins, right greater than left and had to have bilateral groin wound exploration and debridement on 05/20/2018 by Dr. Donnetta Hutching. She had been doing well since. ? ?She denise symptoms of claudication, non healing wounds or rest pain involving the right LE. She does ambulate some with her AKA prosthesis but minimally. She admits that she does not exercise her right leg. She takes gabapentin for phantom pains. She explains she is still having a lot of phantom pains in left AKA. She also has some numbness and tingling in right foot. She is concerned about thickening of the right great toe nail. Says she has been applying antifungal ointment but it is not really helping. She checks her foot regularly for any wounds.  ?  ?The pt is on a statin for cholesterol management.    ?The pt is on an aspirin.    Other AC:  Xarelto ?The pt is not on meds for hypertension. ?The pt does have diabetes. ?Tobacco hx:  Former-quit 2018 ? ?Past Medical History:  ?Diagnosis Date  ? Above knee amputation of left lower extremity ([place]) 01/13/2017  ? Acquired absence of left leg above knee ([place]) 01/13/2017  ? Acute kidney failure, unspecified ([place]) 06/18/2020  ? Acute renal failure syndrome ([place]) 06/18/2020  ? AKI (acute kidney injury) ([place])   ? Anxiety 06/18/2020  ? Aortobifemoral bypass graft thrombosis, initial encounter ([place]) 04/19/2018  ? Aortoiliac occlusive disease ([place]) 05/30/2017  ? Arterial graft thrombosis ([place]) 04/19/2018  ?  Arthrodesis status 05/18/2016  ? Atherosclerosis of aorta ([place]) 06/18/2020  ? Benign essential hypertension 06/18/2020  ? Formatting of this note might be different from the original. Last Assessment & Plan:  Formatting of this note might be different from the original. Clearly not hypertensive.  Not on any medications.  ? Cervical myelopathy ([place])   ? Chronic pain syndrome   ? CKD (chronic kidney disease), stage III ([place])   ? Critical lower limb ischemia ([place]) 12/2016  ? Extensive left leg thromboembolism with eventual left AKA; May 2018 -> developed right leg critical limb ischemia  ? Depression   ? Diabetes mellitus ([place]) 11/16/2018  ? Dysphagia   ? Embolism and thrombosis of iliac artery ([place]) 05/30/2017  ? Formatting of this note might be different from the original. Last Assessment & Plan:  Formatting of this note might be different from the original. Followed by Dr.  Multiple surgeries.  Now doing well.  Trying to get up and going with her prosthesis.  we need to be following her lipids. Smoking cessation counseling.  ? Former heavy tobacco smoker 07/03/2017  ? GERD (gastroesophageal reflux disease) 11/16/2018  ? Hx of migraine headaches   ? Hyperlipidemia   ? Hyperlipidemia LDL goal <70 05/12/2017  ? Hypertension 11/16/2018  ? Hypothyroid   ? Hypothyroidism 11/16/2018  ? Ischemia of left lower extremity 01/08/2017  ? Formatting of this note might be different from the original. Last Assessment & Plan:  Formatting of this note might  be different from the original. Status post left AKA, now with left lower extremity ischemia. Pending aortobifem bypass. The urgent procedure.  ? Ischemia of lower extremity 01/08/2017  ? Leriche syndrome ([place]) 12/2016  ? Chronically occluded aorta  ? Leukocytosis 06/18/2020  ? Osteoporosis   ? PAD (peripheral artery disease) ([place])   ? Status post left AKA for extensive thromboembolism, right leg has in-line flow through popliteal artery only.  Bilateral iliac stents occluded  ? Panic  attacks   ? Peripheral artery disease ([place]) 05/12/2017  ? Peripheral vascular disease ([place]) 05/12/2017  ? Formatting of this note might be different from the original. Last Assessment & Plan:  Formatting of this note might be different from the original. At this point I want to let her recover from her surgery and her amputation.  She is asked to follow-up in the [place] point office.  My plan was for her to be seen in in 1year once our [place] office reopens.  She would need to be reassigned to a n  ? Personal history of other diseases of the nervous system and sense organs 06/16/2021  ? Phantom limb syndrome with pain ([place]) 06/18/2020  ? Pre-operative cardiovascular examination 05/12/2017  ? PTSD (post-traumatic stress disorder)   ? Pure hypertriglyceridemia 06/18/2020  ? S/P cervical spinal fusion 05/18/2016  ? Shortness of breath 11/16/2018  ? Overview:  exertional   Formatting of this note might be different from the original. exertional Formatting of this note might be different from the original. Formatting of this note might be different from the original. exertional Formatting of this note might be different from the original. Overview:  exertional  ? Stage 3 chronic kidney disease ([place]) 06/23/2020  ? Thrombocytopenia ([place]) 06/18/2020  ? Type 2 diabetes mellitus with other specified complication ([place]) 99991111  ? Vitamin D deficiency, unspecified 06/23/2020  ? Wound infection 05/20/2018  ? ? ?Past Surgical History:  ?Procedure Laterality Date  ? ABDOMINAL AORTIC ANEURYSM REPAIR N/A 04/19/2018  ? Procedure: AORTIC EMBOLECTOMY AORTOGRAM;  Surgeon: Rosetta Posner, MD;  Location: [facility] OR;  Service: Vascular;  Laterality: N/A;  ? ABDOMINAL AORTOGRAM N/A 04/19/2018  ? Procedure: ABDOMINAL AORTOGRAM;  Surgeon: Rosetta Posner, MD;  Location: [facility] OR;  Service: Vascular;  Laterality: N/A;  ? ABDOMINAL AORTOGRAM W/LOWER EXTREMITY Right 04/27/2017  ? Procedure: Abdominal Aortogram w/Lower Extremity;  Surgeon: Waynetta Sandy, MD;  Location: [place] CV LAB;  Service: Cardiovascular;  Laterality: Right: Bilateral iliac stents occluded. Normal aortic flow with patent hypogastric and renal arteries. Femoral arteries patent by ultrasound; right leg and showed below-knee runoff -> plan Aortobifem Bypass  ? AMPUTATION Left 01/09/2017  ? Procedure: AMPUTATION ABOVE KNEE;  Surgeon: Waynetta Sandy, MD;  Location: [place];  Service: Vascular;  Laterality: Left;  ? AORTA - BILATERAL FEMORAL ARTERY BYPASS GRAFT N/A 05/30/2017  ? Procedure: AORTA BIFEMORAL BYPASS GRAFT USING [id number] HEMASHIELD GOLD GRAFT;  Surgeon: Waynetta Sandy, MD;  Location: [place];  Service: Vascular;  Laterality: N/A;  ? AORTOGRAM Left 01/08/2017  ? Procedure: Ultrasound Guided Cannulation Left Common Femoral Artery; Aortagram;  Surgeon: Waynetta Sandy, MD;  Location: [place];  Service: Vascular;  Laterality: Left;  ? ARTERY EXPLORATION Left 01/08/2017  ? Procedure: Left Common Femoral Artery Exploration;  Surgeon: Waynetta Sandy, MD;  Location: [place];  Service: Vascular;  Laterality: Left;  ? BACK SURGERY    ? EMBOLECTOMY Left 01/08/2017  ? Procedure: Left Lower Extremity Thromboembolectomy;  Surgeon:  Waynetta Sandy, MD;  Location: [place];  Service: Vascular;  Laterality: Left;  ? ENDARTERECTOMY POPLITEAL Left 01/08/2017  ? Procedure: Left Popliteal Endarterectomy;  Surgeon: Waynetta Sandy, MD;  Location: [place];  Service: Vascular;  Laterality: Left;  ? FEMORAL ARTERY EXPLORATION Bilateral 05/20/2018  ? Procedure: FEMORAL EXPLORATION, RIGHT;  Surgeon: Rosetta Posner, MD;  Location: [facility] OR;  Service: Vascular;  Laterality: Bilateral;  ? INSERTION OF ILIAC STENT Bilateral 01/08/2017  ? Procedure: Bilateral Common Iliac Stent and Left Popliteal Artery Stent;  Surgeon: Waynetta Sandy, MD;  Location: [place];  Service: Vascular;  Laterality: Bilateral;  ? LOWER EXTREMITY ANGIOGRAM Bilateral 01/08/2017  ?  Procedure: Bilateral Lower Extremity Angiogram;  Surgeon: Waynetta Sandy, MD;  Location: [place];  Service: Vascular;  Laterality: Bilateral;  ? PATCH ANGIOPLASTY  01/08/2017  ? Procedure: Patch Angioplasty Left Popl

## 2022-03-03 ENCOUNTER — Ambulatory Visit (INDEPENDENT_AMBULATORY_CARE_PROVIDER_SITE_OTHER): Payer: Medicare Other | Admitting: Physician Assistant

## 2022-03-03 ENCOUNTER — Ambulatory Visit (HOSPITAL_COMMUNITY)
Admission: RE | Admit: 2022-03-03 | Discharge: 2022-03-03 | Disposition: A | Discharge status: Home / Self Care | Payer: Medicare Other | Source: Ambulatory Visit | Attending: Vascular Surgery | Admitting: Vascular Surgery

## 2022-03-03 VITALS — BP 96/64 | HR 88 | Temp 96.5°F | Ht 65.0 in

## 2022-03-03 DIAGNOSIS — I739 Peripheral vascular disease, unspecified: Secondary | ICD-10-CM | POA: Diagnosis present

## 2022-03-03 DIAGNOSIS — I779 Disorder of arteries and arterioles, unspecified: Secondary | ICD-10-CM | POA: Insufficient documentation

## 2022-03-12 ENCOUNTER — Other Ambulatory Visit: Payer: Self-pay

## 2022-03-16 ENCOUNTER — Ambulatory Visit (INDEPENDENT_AMBULATORY_CARE_PROVIDER_SITE_OTHER): Payer: Medicare Other | Admitting: Cardiology

## 2022-03-16 ENCOUNTER — Encounter: Payer: Self-pay | Admitting: Cardiology

## 2022-03-16 VITALS — BP 122/70 | HR 124 | Ht 60.0 in | Wt 102.3 lb

## 2022-03-16 DIAGNOSIS — E785 Hyperlipidemia, unspecified: Secondary | ICD-10-CM

## 2022-03-16 DIAGNOSIS — I10 Essential (primary) hypertension: Secondary | ICD-10-CM | POA: Diagnosis not present

## 2022-03-16 DIAGNOSIS — T82868A Thrombosis of vascular prosthetic devices, implants and grafts, initial encounter: Secondary | ICD-10-CM | POA: Diagnosis not present

## 2022-03-16 DIAGNOSIS — I7409 Other arterial embolism and thrombosis of abdominal aorta: Secondary | ICD-10-CM | POA: Diagnosis not present

## 2022-03-16 DIAGNOSIS — I700 Atherosclerosis of aorta: Secondary | ICD-10-CM | POA: Diagnosis not present

## 2022-03-16 NOTE — Progress Notes (Signed)
?Cardiology Office Note:   ? ?Date:  03/16/2022  ? ?ID:  Alison Lynch, DOB [birth year], MRN [id number] ? ?PCP:  Charlynn Court, NP  ?Cardiologist:  Jenean Lindau, MD  ? ?Referring MD: Charlynn Court, NP  ? ? ?ASSESSMENT:   ? ?1. Aortobifemoral bypass graft thrombosis, initial encounter ([place])   ?2. Aortoiliac occlusive disease ([place])   ?3. Atherosclerosis of aorta ([place])   ?4. Benign essential hypertension   ?5. Hyperlipidemia LDL goal <70   ? ?PLAN:   ? ?In order of problems listed above: ? ?Atherosclerotic vascular disease and peripheral vascular disease: Secondary prevention stressed with the patient.  Importance of compliance with diet medication stressed and she vocalized understanding. ?Essential hypertension: Blood pressure stable and diet was emphasized. ?Mixed dyslipidemia: Lipids were reviewed and she has hypertriglyceridemia.  She tells me that she is going to go for an annual physical in the next few days to her primary care and will get all blood work and send them to me. ?Diabetes mellitus: Managed by primary care.  Diet emphasized. ?Ex-smoker: Promises never to go back to smoking. ?Patient will be seen in follow-up appointment in 6 months or earlier if the patient has any concerns ? ? ? ?Medication Adjustments/Labs and Tests Ordered: ?Current medicines are reviewed at length with the patient today.  Concerns regarding medicines are outlined above.  ?No orders of the defined types were placed in this encounter. ? ?No orders of the defined types were placed in this encounter. ? ? ? ?No chief complaint on file. ?  ? ?History of Present Illness:   ? ?Alison Lynch is a 61 y.o. female.  Patient has past medical history of atherosclerotic vascular disease with peripheral vascular disease and bypass surgery.  She has history of essential hypertension and mixed dyslipidemia.  She denies any problems at this time.  She tells me that before coming here she had an episode of throwing up.  She is going to see her  primary care or urgent care if this persists.  No chest pain orthopnea or PND.  She looks fine overall.  At the time of my evaluation, the patient is alert awake oriented and in no distress. ? ?Past Medical History:  ?Diagnosis Date  ? Above knee amputation of left lower extremity ([place]) 01/13/2017  ? Acquired absence of left leg above knee ([place]) 01/13/2017  ? Acute renal failure syndrome ([place]) 06/18/2020  ? AKI (acute kidney injury) ([place])   ? Anxiety 06/18/2020  ? Aortobifemoral bypass graft thrombosis, initial encounter ([place]) 04/19/2018  ? Aortoiliac occlusive disease ([place]) 05/30/2017  ? Arterial graft thrombosis ([place]) 04/19/2018  ? Arthrodesis status 05/18/2016  ? Atherosclerosis of aorta ([place]) 06/18/2020  ? Benign essential hypertension 06/18/2020  ? Formatting of this note might be different from the original. Last Assessment & Plan:  Formatting of this note might be different from the original. Clearly not hypertensive.  Not on any medications.  ? Cervical myelopathy ([place])   ? Chest pain of uncertain etiology 0000000  ? Chronic pain syndrome   ? CKD (chronic kidney disease), stage III ([place])   ? Critical lower limb ischemia ([place]) 12/2016  ? Extensive left leg thromboembolism with eventual left AKA; May 2018 -> developed right leg critical limb ischemia  ? Depression   ? Dysphagia   ? 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 Formatting of this note might be different from the original. Last Assessment & Plan:  Formatting of this note  might be different from the original. Followed by Dr.  Multiple surgeries.  Now doing well.  Trying to get up and going with her prosthesis.  we need to be following her lipids. Smoking cessation counseling.  ? Former heavy tobacco smoker 07/03/2017  ? GERD (gastroesophageal reflux disease) 11/16/2018  ? Hx of migraine headaches   ? Hyperlipidemia   ? Hyperlipidemia LDL goal <70 05/12/2017  ? Hypertension 11/16/2018  ? Hypothyroid   ? Hypothyroidism 11/16/2018  ?  Ischemia of left lower extremity 01/08/2017  ? Formatting of this note might be different from the original. Last Assessment & Plan:  Formatting of this note might be different from the original. Status post left AKA, now with left lower extremity ischemia. Pending aortobifem bypass. The urgent procedure.  ? Ischemia of lower extremity 01/08/2017  ? Leriche syndrome ([place]) 12/2016  ? Chronically occluded aorta  ? Leukocytosis 06/18/2020  ? Osteoporosis   ? PAD (peripheral artery disease) ([place])   ? Status post left AKA for extensive thromboembolism, right leg has in-line flow through popliteal artery only.  Bilateral iliac stents occluded  ? Panic attacks   ? Peripheral artery disease ([place]) 05/12/2017  ? Peripheral vascular disease ([place]) 05/12/2017  ? Formatting of this note might be different from the original. Last Assessment & Plan:  Formatting of this note might be different from the original. At this point I want to let her recover from her surgery and her amputation.  She is asked to follow-up in the [place] office.  My plan was for her to be seen in in 1year once our [place] office reopens.  She would need to be reassigned to a n  ? Personal history of other diseases of the nervous system and sense organs 06/16/2021  ? Phantom limb syndrome with pain ([place]) 06/18/2020  ? Pre-operative cardiovascular examination 05/12/2017  ? PTSD (post-traumatic stress disorder)   ? Pure hypertriglyceridemia 06/18/2020  ? S/P cervical spinal fusion 05/18/2016  ? Shortness of breath 11/16/2018  ? Overview:  exertional   Formatting of this note might be different from the original. exertional Formatting of this note might be different from the original. Formatting of this note might be different from the original. exertional Formatting of this note might be different from the original. Overview:  exertional  ? Stage 3 chronic kidney disease ([place]) 06/23/2020  ? Thrombocytopenia ([place]) 06/18/2020  ? Type 2 diabetes mellitus  with other specified complication ([place]) 99991111  ? Vitamin D deficiency, unspecified 06/23/2020  ? Wound infection 05/20/2018  ? ? ?Past Surgical History:  ?Procedure Laterality Date  ? ABDOMINAL AORTIC ANEURYSM REPAIR N/A 04/19/2018  ? Procedure: AORTIC EMBOLECTOMY AORTOGRAM;  Surgeon: Rosetta Posner, MD;  Location: [facility];  Service: Vascular;  Laterality: N/A;  ? ABDOMINAL AORTOGRAM N/A 04/19/2018  ? Procedure: ABDOMINAL AORTOGRAM;  Surgeon: Rosetta Posner, MD;  Location: [facility] OR;  Service: Vascular;  Laterality: N/A;  ? ABDOMINAL AORTOGRAM W/LOWER EXTREMITY Right 04/27/2017  ? Procedure: Abdominal Aortogram w/Lower Extremity;  Surgeon: Waynetta Sandy, MD;  Location: [facility];  Service: Cardiovascular;  Laterality: Right: Bilateral iliac stents occluded. Normal aortic flow with patent hypogastric and renal arteries. Femoral arteries patent by ultrasound; right leg and showed below-knee runoff -> plan Aortobifem Bypass  ? AMPUTATION Left 01/09/2017  ? Procedure: AMPUTATION ABOVE KNEE;  Surgeon: Waynetta Sandy, MD;  Location: [facility];  Service: Vascular;  Laterality: Left;  ? AORTA - BILATERAL FEMORAL ARTERY BYPASS GRAFT N/A  05/30/2017  ? Procedure: AORTA BIFEMORAL BYPASS GRAFT USING [id number] HEMASHIELD GOLD GRAFT;  Surgeon: Waynetta Sandy, MD;  Location: [facility];  Service: Vascular;  Laterality: N/A;  ? AORTOGRAM Left 01/08/2017  ? Procedure: Ultrasound Guided Cannulation Left Common Femoral Artery; Aortagram;  Surgeon: Waynetta Sandy, MD;  Location: [facility];  Service: Vascular;  Laterality: Left;  ? ARTERY EXPLORATION Left 01/08/2017  ? Procedure: Left Common Femoral Artery Exploration;  Surgeon: Waynetta Sandy, MD;  Location: [facility];  Service: Vascular;  Laterality: Left;  ? BACK SURGERY    ? EMBOLECTOMY Left 01/08/2017  ? Procedure: Left Lower Extremity Thromboembolectomy;  Surgeon: Waynetta Sandy, MD;  Location: [facility];  Service: Vascular;  Laterality:  Left;  ? ENDARTERECTOMY POPLITEAL Left 01/08/2017  ? Procedure: Left Popliteal Endarterectomy;  Surgeon: Waynetta Sandy, MD;  Location: [facility];  Service: Vascular;  Laterality: Left;  ? FEMORAL ARTER

## 2022-03-16 NOTE — Patient Instructions (Signed)

## 2022-04-04 ENCOUNTER — Other Ambulatory Visit: Payer: Self-pay | Admitting: Cardiology

## 2022-05-06 ENCOUNTER — Telehealth: Payer: Self-pay

## 2022-05-06 NOTE — Telephone Encounter (Signed)
Alison Lynch called to cancel her new patient appointment and she did not want to reschedule at this time.

## 2022-05-07 ENCOUNTER — Telehealth: Payer: Self-pay | Admitting: Cardiology

## 2022-05-07 NOTE — Telephone Encounter (Signed)
Patient's medication list faxed to [facility] at [phone]

## 2022-05-07 NOTE — Telephone Encounter (Signed)
New Message:     Please fax a list of patient's medicine. Please fax to [phone] Attention:[facility]

## 2022-05-18 ENCOUNTER — Ambulatory Visit: Payer: Medicare HMO | Admitting: Family Medicine

## 2022-05-24 ENCOUNTER — Telehealth: Payer: Self-pay | Admitting: Cardiology

## 2022-05-24 DIAGNOSIS — E785 Hyperlipidemia, unspecified: Secondary | ICD-10-CM

## 2022-05-24 MED ORDER — NEXLIZET 180-10 MG PO TABS: 1.0000 | ORAL_TABLET | Freq: Every day | ORAL | 12 refills | Status: DC

## 2022-05-24 MED ORDER — ICOSAPENT ETHYL 1 G PO CAPS
2.0000 g | ORAL_CAPSULE | Freq: Two times a day (BID) | ORAL | 3 refills | Status: DC
Start: 2022-05-24 — End: 2023-02-02

## 2022-05-24 MED ORDER — NITROGLYCERIN 0.4 MG SL SUBL: 0.4000 mg | SUBLINGUAL_TABLET | SUBLINGUAL | 6 refills | Status: DC | PRN

## 2022-05-24 MED ORDER — ATORVASTATIN CALCIUM 80 MG PO TABS: ORAL_TABLET | ORAL | 3 refills | Status: DC

## 2022-05-31 ENCOUNTER — Telehealth: Payer: Self-pay

## 2022-05-31 NOTE — Telephone Encounter (Signed)
Submitted P/A for Nexlizet on CoverMyMeds.   KEY: [id number]  Pending.Marland KitchenMarland Kitchen

## 2022-05-31 NOTE — Telephone Encounter (Signed)
Nexlizet:  Outcome Approved-today Request Reference Number: [id number]. NEXLIZET TAB 180/10MG  is approved through 11/28/2022. Your patient may now fill this prescription and it will be covered.

## 2022-06-22 ENCOUNTER — Telehealth: Payer: Self-pay | Admitting: Cardiology

## 2022-06-22 NOTE — Telephone Encounter (Signed)
Pt states that she has not tried to swallow the medication. Pt will try the pill with pudding to see if she has a problem. Pt states as a child she had to have her throat stretched and she is afraid of large capsules. Pt will try and let us know if she has problems. Vascepa does not come in any other form. Pt verbalized understanding and had no additional questions.

## 2022-06-22 NOTE — Telephone Encounter (Signed)
Patient states Vascepa pills are too large for her to swallow  Please advise   [phone]

## 2022-06-25 ENCOUNTER — Telehealth: Payer: Self-pay | Admitting: Cardiology

## 2022-06-25 DIAGNOSIS — E785 Hyperlipidemia, unspecified: Secondary | ICD-10-CM

## 2022-06-25 DIAGNOSIS — E781 Pure hyperglyceridemia: Secondary | ICD-10-CM

## 2022-06-25 NOTE — Telephone Encounter (Signed)
Pt c/o medication issue:  1. Name of Medication: icosapent Ethyl (VASCEPA) 1 g capsule  2. How are you currently taking this medication (dosage and times per day)? Take 2 capsules (2 g total) by mouth 2 (two) times daily.  3. Are you having a reaction (difficulty breathing--STAT)? no  4. What is your medication issue? Too big for patient to swallow. Looking for another form of medication for the patient. Please advise

## 2022-06-28 NOTE — Telephone Encounter (Signed)
That is the only size the capsules come in unfortunately. Lovaza capsules are about the same size. Other alternatives for triglycerides would be fenofibrate

## 2022-06-28 NOTE — Telephone Encounter (Signed)
Heather with [facility] calling to follow up.

## 2022-06-28 NOTE — Telephone Encounter (Signed)
Recommendations reviewed with pt as per Dr. Revankar's note.  Pt verbalized understanding and had no additional questions.   

## 2022-06-29 NOTE — Telephone Encounter (Signed)
Advised [place] that we are doing labs and see how labs are. Advised that pt is aware.

## 2022-06-29 NOTE — Telephone Encounter (Signed)
[facility] is calling again to get clarification on medication.

## 2022-07-02 LAB — BASIC METABOLIC PANEL
BUN/Creatinine Ratio: 9 (ref 9–23)
BUN: 17 mg/dL (ref 6–24)
CO2: 21 mmol/L (ref 20–29)
Calcium: 9.5 mg/dL (ref 8.7–10.2)
Chloride: 100 mmol/L (ref 96–106)
Creatinine, Ser: 1.87 mg/dL — ABNORMAL HIGH (ref 0.57–1.00)
Glucose: 68 mg/dL — ABNORMAL LOW (ref 70–99)
Potassium: 4.9 mmol/L (ref 3.5–5.2)
Sodium: 142 mmol/L (ref 134–144)
eGFR: 31 mL/min/{1.73_m2} — ABNORMAL LOW (ref >59)

## 2022-07-02 LAB — HEPATIC FUNCTION PANEL
ALT: 26 IU/L (ref 0–32)
AST: 31 IU/L (ref 0–40)
Albumin: 4.5 g/dL (ref 3.8–4.9)
Alkaline Phosphatase: 67 IU/L (ref 44–121)
Bilirubin Total: 0.4 mg/dL (ref 0.0–1.2)
Bilirubin, Direct: 0.2 mg/dL (ref 0.00–0.40)
Total Protein: 7 g/dL (ref 6.0–8.5)

## 2022-07-02 LAB — LIPID PANEL
Chol/HDL Ratio: 2.9 ratio (ref 0.0–4.4)
Cholesterol, Total: 135 mg/dL (ref 100–199)
HDL: 47 mg/dL (ref >39)
LDL Chol Calc (NIH): 69 mg/dL (ref 0–99)
Triglycerides: 101 mg/dL (ref 0–149)
VLDL Cholesterol Cal: 19 mg/dL (ref 5–40)

## 2022-11-24 ENCOUNTER — Telehealth: Payer: Self-pay

## 2022-11-24 NOTE — Telephone Encounter (Signed)
PA submitted on CMM for Nexlizet 180-10 mg. Key [id number]

## 2022-12-03 NOTE — Telephone Encounter (Signed)
PA approved for Nexlizet 180-10 mg through 11/29/23.

## 2022-12-13 ENCOUNTER — Ambulatory Visit: Payer: 59 | Attending: Cardiology | Admitting: Cardiology

## 2022-12-16 ENCOUNTER — Encounter: Payer: Self-pay | Admitting: Cardiology

## 2023-02-02 ENCOUNTER — Encounter: Payer: Self-pay | Admitting: Cardiology

## 2023-02-02 ENCOUNTER — Ambulatory Visit: Payer: 59 | Attending: Cardiology | Admitting: Cardiology

## 2023-02-02 VITALS — BP 94/60 | HR 88 | Ht 62.0 in | Wt <= 1120 oz

## 2023-02-02 DIAGNOSIS — E1169 Type 2 diabetes mellitus with other specified complication: Secondary | ICD-10-CM | POA: Diagnosis not present

## 2023-02-02 DIAGNOSIS — I10 Essential (primary) hypertension: Secondary | ICD-10-CM

## 2023-02-02 DIAGNOSIS — I7409 Other arterial embolism and thrombosis of abdominal aorta: Secondary | ICD-10-CM

## 2023-02-02 DIAGNOSIS — Z87891 Personal history of nicotine dependence: Secondary | ICD-10-CM

## 2023-02-02 MED ORDER — NITROGLYCERIN 0.4 MG SL SUBL
0.4000 mg | SUBLINGUAL_TABLET | SUBLINGUAL | 11 refills | Status: AC | PRN
Start: 2023-02-02 — End: ?

## 2023-02-02 NOTE — Patient Instructions (Signed)

## 2023-02-02 NOTE — Progress Notes (Signed)
Cardiology Office Note:    Date:  02/02/2023   ID:  Alison Lynch, DOB 12-28-1960, MRN [id number]  PCP:  Cher Nakai, MD  Cardiologist:  Jenean Lindau, MD   Referring MD: Charlynn Court, NP    ASSESSMENT:    1. Aortoiliac occlusive disease ([place])   2. Benign essential hypertension   3. Type 2 diabetes mellitus with other specified complication, without long-term current use of insulin ([place])   4. Former heavy tobacco smoker    PLAN:    In order of problems listed above:  Atherosclerotic vascular disease: Secondary prevention stressed with the patient.  Importance of compliance with diet medication stressed and she vocalized understanding. Mixed dyslipidemia: Diet emphasized.  Lipids followed by primary care. Diabetes mellitus: This is managed by primary care.  Diet emphasized.  Again she has lost significant amount of weight and is being evaluated extensively. Patient will be seen in follow-up appointment in 9 months or earlier if the patient has any concerns    Medication Adjustments/Labs and Tests Ordered: Current medicines are reviewed at length with the patient today.  Concerns regarding medicines are outlined above.  No orders of the defined types were placed in this encounter.  No orders of the defined types were placed in this encounter.    No chief complaint on file.    History of Present Illness:    Alison Lynch is a 62 y.o. female.  Patient has past medical history of peripheral vascular disease, essential hypertension, dyslipidemia and renal insufficiency.  She is coming in a wheelchair.  She tells me that she has lost significant amount of weight and is being evaluated by gastroenterology and primary care.  She denies any orthopnea or PND.  At the time of my evaluation, the patient is alert awake oriented and in no distress.  Past Medical History:  Diagnosis Date   Above knee amputation of left lower extremity ([place]) 01/13/2017   Acquired absence of left leg  above knee ([place]) 01/13/2017   Acute renal failure syndrome ([place]) 06/18/2020   AKI (acute kidney injury) ([place])    Anxiety 06/18/2020   Aortobifemoral bypass graft thrombosis, initial encounter ([place]) 04/19/2018   Aortoiliac occlusive disease ([place]) 05/30/2017   Arterial graft thrombosis ([place]) 04/19/2018   Arthrodesis status 05/18/2016   Atherosclerosis of aorta ([place]) 06/18/2020   Benign essential hypertension 06/18/2020   Formatting of this note might be different from the original. Last Assessment & Plan:  Formatting of this note might be different from the original. Clearly not hypertensive.  Not on any medications.   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 Last Assessment & Plan:  Formatting of this note might be different from the original. Followed by Dr.  Cain Sieve surgeries.  Now doing well.  Trying to get up and going with her prosthesis.  we need to be following her lipids. Smoking cessation counseling.   Former heavy tobacco smoker 07/03/2017   GERD (gastroesophageal reflux disease) 11/16/2018   Hx of migraine headaches    Hyperlipidemia  Hyperlipidemia LDL goal <70 05/12/2017   Hypertension 11/16/2018   Hypothyroid    Hypothyroidism 11/16/2018   Ischemia of left lower extremity 01/08/2017   Formatting of this note might be different from the original. Last Assessment & Plan:  Formatting of this note might be different from the original. Status post left AKA, now with left lower extremity ischemia. Pending aortobifem bypass. The urgent procedure.   Ischemia of lower  extremity 01/08/2017   Leriche syndrome ([place]) 12/2016   Chronically occluded aorta   Leukocytosis 06/18/2020   Osteoporosis    PAD (peripheral artery disease) (HCC)    Status post left AKA for extensive thromboembolism, right leg has in-line flow through popliteal artery only.  Bilateral iliac stents occluded   Panic attacks    Peripheral artery disease ([place]) 05/12/2017   Peripheral vascular disease ([employer]) 05/12/2017   Formatting of this note might be different from the original. Last Assessment & Plan:  Formatting of this note might be different from the original. At this point I want to let her recover from her surgery and her amputation.  She is asked to follow-up in the [place] point office.  My plan was for her to be seen in in 1year once our [place] office reopens.  She would need to be reassigned to a n   Personal history of other diseases of the nervous system and sense organs 06/16/2021   Phantom limb syndrome with pain ([place]) 06/18/2020   Pre-operative cardiovascular examination 05/12/2017   PTSD (post-traumatic stress disorder)    Pure hypertriglyceridemia 06/18/2020   S/P cervical spinal fusion 05/18/2016   Shortness of breath 11/16/2018   Overview:  exertional   Formatting of this note might be different from the original. exertional Formatting of this note might be different from the original. Formatting of this note might be different from the original. exertional Formatting of this note might be different from the original. Overview:  exertional   Stage 3 chronic kidney disease ([place]) 06/23/2020   Thrombocytopenia ([place]) 06/18/2020   Type 2 diabetes mellitus with other specified complication ([place]) 99991111   Vitamin D deficiency, unspecified 06/23/2020   Wound infection 05/20/2018    Past Surgical History:  Procedure Laterality Date   ABDOMINAL AORTIC ANEURYSM REPAIR N/A 04/19/2018   Procedure: AORTIC EMBOLECTOMY AORTOGRAM;  Surgeon: Rosetta Posner, MD;  Location: [facility];   Service: Vascular;  Laterality: N/A;   ABDOMINAL AORTOGRAM N/A 04/19/2018   Procedure: ABDOMINAL AORTOGRAM;  Surgeon: Rosetta Posner, MD;  Location: [facility];  Service: Vascular;  Laterality: N/A;   ABDOMINAL AORTOGRAM W/LOWER EXTREMITY Right 04/27/2017   Procedure: Abdominal Aortogram w/Lower Extremity;  Surgeon: Waynetta Sandy, MD;  Location: [facility];  Service: Cardiovascular;  Laterality: Right: Bilateral iliac stents occluded. Normal aortic flow with patent hypogastric and renal arteries. Femoral arteries patent by ultrasound; right leg and showed below-knee runoff -> plan Aortobifem Bypass   AMPUTATION Left 01/09/2017   Procedure: AMPUTATION ABOVE KNEE;  Surgeon: Waynetta Sandy, MD;  Location: [facility];  Service: Vascular;  Laterality: Left;   AORTA - BILATERAL FEMORAL ARTERY BYPASS GRAFT N/A 05/30/2017   Procedure: AORTA BIFEMORAL BYPASS GRAFT USING [id number] HEMASHIELD GOLD GRAFT;  Surgeon: Waynetta Sandy, MD;  Location: [facility];  Service: Vascular;  Laterality: N/A;   AORTOGRAM Left 01/08/2017   Procedure: Ultrasound Guided Cannulation Left Common Femoral Artery; Aortagram;  Surgeon: Waynetta Sandy, MD;  Location: [facility];  Service: Vascular;  Laterality: Left;   ARTERY  EXPLORATION Left 01/08/2017   Procedure: Left Common Femoral Artery Exploration;  Surgeon: Waynetta Sandy, MD;  Location: [facility];  Service: Vascular;  Laterality: Left;   BACK SURGERY     EMBOLECTOMY Left 01/08/2017   Procedure: Left Lower Extremity Thromboembolectomy;  Surgeon: Waynetta Sandy, MD;  Location: [facility];  Service: Vascular;  Laterality: Left;   ENDARTERECTOMY POPLITEAL Left 01/08/2017   Procedure: Left Popliteal Endarterectomy;  Surgeon: Waynetta Sandy, MD;  Location: [facility];  Service: Vascular;  Laterality: Left;   FEMORAL ARTERY EXPLORATION Bilateral 05/20/2018   Procedure: FEMORAL EXPLORATION, RIGHT;  Surgeon: Rosetta Posner, MD;  Location: [facility];   Service: Vascular;  Laterality: Bilateral;   INSERTION OF ILIAC STENT Bilateral 01/08/2017   Procedure: Bilateral Common Iliac Stent and Left Popliteal Artery Stent;  Surgeon: Waynetta Sandy, MD;  Location: [facility];  Service: Vascular;  Laterality: Bilateral;   LOWER EXTREMITY ANGIOGRAM Bilateral 01/08/2017   Procedure: Bilateral Lower Extremity Angiogram;  Surgeon: Waynetta Sandy, MD;  Location: [facility];  Service: Vascular;  Laterality: Bilateral;   PATCH ANGIOPLASTY  01/08/2017   Procedure: Patch Angioplasty Left Popliteal Artery ;  Surgeon: Waynetta Sandy, MD;  Location: [facility];  Service: Vascular;;   TRANSTHORACIC ECHOCARDIOGRAM  12/2016   Normal EF 60-65%.  Essentially normal echocardiogram.  No significant valvular lesions.    Current Medications: Current Meds  Medication Sig   aspirin EC 81 MG tablet Take 81 mg by mouth daily.   atorvastatin (LIPITOR) 80 MG tablet TAKE ONE(1) TABLET BY MOUTH ONCE DAILY   busPIRone (BUSPAR) 15 MG tablet Take 2 tablets by mouth 2 (two) times daily.   gabapentin (NEURONTIN) 400 MG capsule Take 400 mg by mouth 3 (three) times daily.   hydrOXYzine (ATARAX) 50 MG tablet Take 50 mg by mouth 2 (two) times daily.   levothyroxine (SYNTHROID) 75 MCG tablet Take 75 mcg by mouth daily.   metFORMIN (GLUCOPHAGE-XR) 500 MG 24 hr tablet Take 1 tablet by mouth in the morning.   mirtazapine (REMERON) 7.5 MG tablet Take 7.5 mg by mouth at bedtime.   nitroGLYCERIN (NITROSTAT) 0.4 MG SL tablet Place 1 tablet (0.4 mg total) under the tongue every 5 (five) minutes as needed for chest pain.   PARoxetine (PAXIL) 40 MG tablet Take 1 tablet by mouth daily.    PROLIA 60 MG/ML SOSY injection Inject 60 mg into the skin every 6 (six) months.   promethazine (PHENERGAN) 12.5 MG tablet Take 12.5 mg by mouth every 6 (six) hours as needed for nausea or vomiting.   QUEtiapine (SEROQUEL) 200 MG tablet Take 200 mg by mouth at bedtime.   rivaroxaban (XARELTO) 2.5 MG  TABS tablet Take 2.5 mg by mouth daily.     Allergies:   Clindamycin/lincomycin   Social History   Socioeconomic History   Marital status: Married    Spouse name: Not on file   Number of children: Not on file   Years of education: Not on file   Highest education level: Not on file  Occupational History   Not on file  Tobacco Use   Smoking status: Former    Packs/day: 0.50    Years: 40.00    Total pack years: 20.00    Types: Cigarettes    Quit date: 12/21/2016    Years since quitting: 6.1   Smokeless tobacco: Never  Vaping Use   Vaping Use: Never used  Substance and Sexual Activity   Alcohol use: No  Drug use: No   Sexual activity: Not Currently  Other Topics Concern   Not on file  Social History Narrative   She is married. No kids. Currently disabled. No longer working. She still smokes at least a pack a day, but is hoping to quit with patches. Does not drink alcohol or use illicit drugs   Social Determinants of Health   Financial Resource Strain: Not on file  Food Insecurity: Not on file  Transportation Needs: Not on file  Physical Activity: Not on file  Stress: Not on file  Social Connections: Not on file     Family History: The patient's family history includes Cancer in her maternal grandmother; Heart disease in her maternal grandfather.  ROS:   Please see the history of present illness.    All other systems reviewed and are negative.  EKGs/Labs/Other Studies Reviewed:    The following studies were reviewed today: I discussed my findings with the patient at length.   Recent Labs: 07/01/2022: ALT 26; BUN 17; Creatinine, Ser 1.87; Potassium 4.9; Sodium 142  Recent Lipid Panel    Component Value Date/Time   CHOL 135 07/01/2022 1347   TRIG 101 07/01/2022 1347   HDL 47 07/01/2022 1347   CHOLHDL 2.9 07/01/2022 1347   LDLCALC 69 07/01/2022 1347    Physical Exam:    VS:  BP 94/60   Pulse 88   Ht '5\' 2"'$  (1.575 m)   Wt 63 lb 6.4 oz (28.8 kg)   SpO2  98%   BMI 11.60 kg/m     Wt Readings from Last 3 Encounters:  02/02/23 63 lb 6.4 oz (28.8 kg)  03/16/22 102 lb 4.8 oz (46.4 kg)  09/03/21 119 lb (54 kg)     GEN: Patient is in no acute distress HEENT: Normal NECK: No JVD; No carotid bruits LYMPHATICS: No lymphadenopathy CARDIAC: Hear sounds regular, 2/6 systolic murmur at the apex. RESPIRATORY:  Clear to auscultation without rales, wheezing or rhonchi  ABDOMEN: Soft, non-tender, non-distended MUSCULOSKELETAL:  No edema; No deformity.  Patient has below-knee amputation. SKIN: Warm and dry NEUROLOGIC:  Alert and oriented x 3 PSYCHIATRIC:  Normal affect   Signed, Jenean Lindau, MD  02/02/2023 3:13 PM    [facility]

## 2023-02-08 DIAGNOSIS — N1832 Chronic kidney disease, stage 3b: Secondary | ICD-10-CM | POA: Diagnosis not present

## 2023-02-08 DIAGNOSIS — M810 Age-related osteoporosis without current pathological fracture: Secondary | ICD-10-CM | POA: Diagnosis not present

## 2023-02-08 DIAGNOSIS — E039 Hypothyroidism, unspecified: Secondary | ICD-10-CM | POA: Diagnosis not present

## 2023-02-08 DIAGNOSIS — E1140 Type 2 diabetes mellitus with diabetic neuropathy, unspecified: Secondary | ICD-10-CM | POA: Diagnosis not present

## 2023-02-08 DIAGNOSIS — K219 Gastro-esophageal reflux disease without esophagitis: Secondary | ICD-10-CM | POA: Diagnosis not present

## 2023-02-08 DIAGNOSIS — I10 Essential (primary) hypertension: Secondary | ICD-10-CM | POA: Diagnosis not present

## 2023-02-08 DIAGNOSIS — I739 Peripheral vascular disease, unspecified: Secondary | ICD-10-CM | POA: Diagnosis not present

## 2023-02-08 DIAGNOSIS — R7989 Other specified abnormal findings of blood chemistry: Secondary | ICD-10-CM | POA: Diagnosis not present

## 2023-02-08 DIAGNOSIS — E1122 Type 2 diabetes mellitus with diabetic chronic kidney disease: Secondary | ICD-10-CM | POA: Diagnosis not present

## 2023-02-08 DIAGNOSIS — R110 Nausea: Secondary | ICD-10-CM | POA: Diagnosis not present

## 2023-02-08 DIAGNOSIS — K8020 Calculus of gallbladder without cholecystitis without obstruction: Secondary | ICD-10-CM | POA: Diagnosis not present

## 2023-02-24 ENCOUNTER — Telehealth: Payer: Self-pay | Admitting: Vascular Surgery

## 2023-02-28 NOTE — Telephone Encounter (Signed)
Completed - spoke with pt, she answered the phone yelling not to call her cell phone anymore and then hung up.  Closing out encounter

## 2023-04-11 DIAGNOSIS — R7689 Other specified abnormal immunological findings in serum: Secondary | ICD-10-CM

## 2023-04-11 DIAGNOSIS — R768 Other specified abnormal immunological findings in serum: Secondary | ICD-10-CM | POA: Insufficient documentation

## 2023-04-11 HISTORY — DX: Other specified abnormal immunological findings in serum: R76.89

## 2023-05-25 ENCOUNTER — Other Ambulatory Visit: Payer: Self-pay | Admitting: Cardiology

## 2023-05-25 DIAGNOSIS — E785 Hyperlipidemia, unspecified: Secondary | ICD-10-CM

## 2023-06-20 ENCOUNTER — Other Ambulatory Visit: Payer: Self-pay | Admitting: Cardiology

## 2023-06-20 DIAGNOSIS — E785 Hyperlipidemia, unspecified: Secondary | ICD-10-CM

## 2023-07-18 ENCOUNTER — Other Ambulatory Visit: Payer: Self-pay | Admitting: Cardiology

## 2023-07-18 DIAGNOSIS — E785 Hyperlipidemia, unspecified: Secondary | ICD-10-CM

## 2023-10-11 ENCOUNTER — Ambulatory Visit (INDEPENDENT_AMBULATORY_CARE_PROVIDER_SITE_OTHER): Payer: Medicare HMO

## 2023-10-11 VITALS — BP 110/58 | HR 96 | Temp 98.5°F | Ht 62.0 in | Wt 75.2 lb

## 2023-10-11 DIAGNOSIS — F419 Anxiety disorder, unspecified: Secondary | ICD-10-CM

## 2023-10-11 DIAGNOSIS — R1310 Dysphagia, unspecified: Secondary | ICD-10-CM

## 2023-10-11 DIAGNOSIS — E785 Hyperlipidemia, unspecified: Secondary | ICD-10-CM | POA: Diagnosis not present

## 2023-10-11 DIAGNOSIS — I10 Essential (primary) hypertension: Secondary | ICD-10-CM | POA: Diagnosis not present

## 2023-10-11 DIAGNOSIS — F4310 Post-traumatic stress disorder, unspecified: Secondary | ICD-10-CM

## 2023-10-11 DIAGNOSIS — Z79899 Other long term (current) drug therapy: Secondary | ICD-10-CM

## 2023-10-11 DIAGNOSIS — I739 Peripheral vascular disease, unspecified: Secondary | ICD-10-CM | POA: Diagnosis not present

## 2023-10-11 DIAGNOSIS — Z87891 Personal history of nicotine dependence: Secondary | ICD-10-CM

## 2023-10-11 DIAGNOSIS — E039 Hypothyroidism, unspecified: Secondary | ICD-10-CM

## 2023-10-11 DIAGNOSIS — E1169 Type 2 diabetes mellitus with other specified complication: Secondary | ICD-10-CM

## 2023-10-11 HISTORY — DX: Other long term (current) drug therapy: Z79.899

## 2023-10-11 MED ORDER — PROMETHAZINE HCL 12.5 MG PO TABS: 12.5000 mg | ORAL_TABLET | Freq: Four times a day (QID) | ORAL | 2 refills | Status: AC | PRN

## 2023-10-11 NOTE — Patient Instructions (Signed)
Sorted out your medicines You decided not to take buspirone anymore. Blood work today Follow up with me in 3 weeks.

## 2023-10-11 NOTE — Assessment & Plan Note (Signed)
Had esophageal dilatation in December 2023.

## 2023-10-11 NOTE — Assessment & Plan Note (Signed)
Confirmed by medical records. Currently on metformin ER 500 mg for blood sugar control. - Continue metformin 500 mg ER ONCE DAILY - Order blood work including A1c

## 2023-10-11 NOTE — Progress Notes (Signed)
Established Patient Office Visit  Subjective   Patient ID: Alison Lynch, female    DOB: Mar 19, 1961  Age: 62 y.o. MRN: [id number]  Chief Complaint  Patient presents with   Establish Care    HPI Alison Lynch is here with her mother and her husband for a new patient visit, to establish care, due ot change in insurance.  The patient, with a history of diabetes, ischemia, and a left leg amputation, presents with concerns about weight loss and confusion regarding her medication regimen. Over the past year, she has experienced significant weight loss, which has led to difficulties with her prosthesis fitting properly. The cause of the weight loss remains undetermined despite multiple medical consultations and investigations, including a colonoscopy, esophageal dilatation, and upper GI endoscopy, all of which were reportedly normal.  The patient's medication regimen has been a source of confusion, with the patient inadvertently taking duplicate medications from different bottles. This has potentially contributed to episodes of dizziness and feelings of being "terrified" and "scared." The patient has also reported discontinuing buspirone, an anxiety medication, without clear rationale.  The patient's diabetes management is unclear, with the patient expressing uncertainty about her diagnosis. She has a history of ischemia in her left leg, which led to an amputation seven years ago. The patient also reports occasional difficulty with bowel movements, often needing to strain.  The patient's living situation may contribute to her health management challenges. She lives with her husband, separately from her mother, who assists with her care, and she does not have a car. The patient also reports having trouble hearing, which may further complicate her medical care and understanding of her health conditions.    Patient Active Problem List   Diagnosis Date Noted   Polypharmacy 10/11/2023   Chest pain of uncertain  etiology 08/31/2021   Personal history of other diseases of the nervous system and sense organs 06/16/2021   CKD (chronic kidney disease), stage III (HCC)    Hx of migraine headaches    Hypothyroid    PAD (peripheral artery disease) (HCC)    Panic attacks    Stage 3 chronic kidney disease (HCC) 06/23/2020   Vitamin D deficiency, unspecified 06/23/2020   Benign essential hypertension 06/18/2020   Phantom limb syndrome with pain (HCC) 06/18/2020   Cervical myelopathy (HCC) 06/18/2020   Leukocytosis 06/18/2020   Thrombocytopenia (HCC) 06/18/2020   Acute renal failure syndrome (HCC) 06/18/2020   Anxiety 06/18/2020   Atherosclerosis of aorta (HCC) 06/18/2020   Pure hypertriglyceridemia 06/18/2020   Type 2 diabetes mellitus with other specified complication (HCC) 11/16/2018   Dysphagia 11/16/2018   GERD (gastroesophageal reflux disease) 11/16/2018   Hypertension 11/16/2018   Hypothyroidism 11/16/2018   Osteoporosis 11/16/2018   Shortness of breath 11/16/2018   Wound infection 05/20/2018   Aortobifemoral bypass graft thrombosis, initial encounter (HCC) 04/19/2018   Arterial graft thrombosis (HCC) 04/19/2018   Former heavy tobacco smoker 07/03/2017   Aortoiliac occlusive disease (HCC) 05/30/2017   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/2017   Peripheral artery disease (HCC) 05/12/2017   Pre-operative cardiovascular examination 05/12/2017   Hyperlipidemia LDL goal <70 05/12/2017   Hyperlipidemia 05/12/2017   Peripheral vascular disease (HCC) 05/12/2017   Above knee amputation of left lower extremity (HCC) 01/13/2017   Acquired absence of left leg above knee (HCC) 01/13/2017   PTSD (post-traumatic stress disorder)    AKI (acute kidney injury) (HCC)    Chronic pain syndrome    Ischemia of lower extremity 01/08/2017   Ischemia  of left lower extremity 01/08/2017   Critical lower limb ischemia (HCC) 12/2016   Leriche syndrome (HCC) 12/2016   S/P cervical spinal fusion  05/18/2016   Arthrodesis status 05/18/2016   Depression 01/09/2014   Past Medical History:  Diagnosis Date   Above knee amputation of left lower extremity (HCC) 01/13/2017   Acquired absence of left leg above knee (HCC) 01/13/2017   Acute renal failure syndrome (HCC) 06/18/2020   AKI (acute kidney injury) (HCC)    Anxiety 06/18/2020   Aortobifemoral bypass graft thrombosis, initial encounter (HCC) 04/19/2018   Aortoiliac occlusive disease (HCC) 05/30/2017   Arterial graft thrombosis (HCC) 04/19/2018   Arthrodesis status 05/18/2016   Atherosclerosis of aorta (HCC) 06/18/2020   Benign essential hypertension 06/18/2020   Formatting of this note might be different from the original. Last Assessment & Plan:  Formatting of this note might be different from the original. Clearly not hypertensive.  Not on any medications.   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 Last Assessment & Plan:  Formatting of this note might be different from the original. Followed by Dr.  Earlyne Iba surgeries.  Now doing well.  Trying to get up and going with her prosthesis.  we need to be following her lipids. Smoking cessation counseling.   Former heavy tobacco smoker 07/03/2017   GERD (gastroesophageal reflux disease) 11/16/2018   Hx of migraine headaches    Hyperlipidemia    Hyperlipidemia LDL goal <70 05/12/2017   Hypertension 11/16/2018   Hypothyroid    Hypothyroidism 11/16/2018   Ischemia of left lower extremity 01/08/2017   Formatting of this note might be different from the original. Last  Assessment & Plan:  Formatting of this note might be different from the original. Status post left AKA, now with left lower extremity ischemia. Pending aortobifem bypass. The urgent procedure.   Ischemia of lower extremity 01/08/2017   Leriche syndrome (HCC) 12/2016   Chronically occluded aorta   Leukocytosis 06/18/2020   Osteoporosis    PAD (peripheral artery disease) (HCC)    Status post left AKA for extensive thromboembolism, right leg has in-line flow through popliteal artery only.  Bilateral iliac stents occluded   Panic attacks    Peripheral artery disease (HCC) 05/12/2017   Peripheral vascular disease (HCC) 05/12/2017   Formatting of this note might be different from the original. Last Assessment & Plan:  Formatting of this note might be different from the original. At this point I want to let her recover from her surgery and her amputation.  She is asked to follow-up in the [place] point office.  My plan was for her to be seen in in 1year once our [place] office reopens.  She would need to be reassigned to a n   Personal history of other diseases of the nervous system and sense organs 06/16/2021   Phantom limb syndrome with pain (HCC) 06/18/2020   Pre-operative cardiovascular examination 05/12/2017   PTSD (post-traumatic stress disorder)    Pure hypertriglyceridemia 06/18/2020   S/P cervical spinal fusion 05/18/2016   Shortness  of breath 11/16/2018   Overview:  exertional   Formatting of this note might be different from the original. exertional Formatting of this note might be different from the original. Formatting of this note might be different from the original. exertional Formatting of this note might be different from the original. Overview:  exertional   Stage 3 chronic kidney disease (HCC) 06/23/2020   Thrombocytopenia (HCC) 06/18/2020   Type 2 diabetes mellitus with other specified complication (HCC) 11/16/2018   Vitamin D deficiency, unspecified 06/23/2020   Wound  infection 05/20/2018   Past Surgical History:  Procedure Laterality Date   ABDOMINAL AORTIC ANEURYSM REPAIR N/A 04/19/2018   Procedure: AORTIC EMBOLECTOMY AORTOGRAM;  Surgeon: Larina Earthly, MD;  Location: [facility] OR;  Service: Vascular;  Laterality: N/A;   ABDOMINAL AORTOGRAM N/A 04/19/2018   Procedure: ABDOMINAL AORTOGRAM;  Surgeon: Larina Earthly, MD;  Location: [facility] OR;  Service: Vascular;  Laterality: N/A;   ABDOMINAL AORTOGRAM W/LOWER EXTREMITY Right 04/27/2017   Procedure: Abdominal Aortogram w/Lower Extremity;  Surgeon: Maeola Harman, MD;  Location: [facility] INVASIVE CV LAB;  Service: Cardiovascular;  Laterality: Right: Bilateral iliac stents occluded. Normal aortic flow with patent hypogastric and renal arteries. Femoral arteries patent by ultrasound; right leg and showed below-knee runoff -> plan Aortobifem Bypass   AMPUTATION Left 01/09/2017   Procedure: AMPUTATION ABOVE KNEE;  Surgeon: Maeola Harman, MD;  Location: [facility] OR;  Service: Vascular;  Laterality: Left;   AORTA - BILATERAL FEMORAL ARTERY BYPASS GRAFT N/A 05/30/2017   Procedure: AORTA BIFEMORAL BYPASS GRAFT USING [id number] HEMASHIELD GOLD GRAFT;  Surgeon: Maeola Harman, MD;  Location: [facility] OR;  Service: Vascular;  Laterality: N/A;   AORTOGRAM Left 01/08/2017   Procedure: Ultrasound Guided Cannulation Left Common Femoral Artery; Aortagram;  Surgeon: Maeola Harman, MD;  Location: [facility] OR;  Service: Vascular;  Laterality: Left;   ARTERY EXPLORATION Left 01/08/2017   Procedure: Left Common Femoral Artery Exploration;  Surgeon: Maeola Harman, MD;  Location: [facility] OR;  Service: Vascular;  Laterality: Left;   BACK SURGERY     EMBOLECTOMY Left 01/08/2017   Procedure: Left Lower Extremity Thromboembolectomy;  Surgeon: Maeola Harman, MD;  Location: [facility] OR;  Service: Vascular;  Laterality: Left;   ENDARTERECTOMY POPLITEAL Left 01/08/2017   Procedure: Left Popliteal Endarterectomy;  Surgeon: Maeola Harman, MD;  Location: [facility] OR;  Service: Vascular;  Laterality: Left;   FEMORAL ARTERY EXPLORATION Bilateral 05/20/2018   Procedure: FEMORAL EXPLORATION, RIGHT;  Surgeon: Larina Earthly, MD;  Location: [facility] OR;  Service: Vascular;  Laterality: Bilateral;   INSERTION OF ILIAC STENT Bilateral 01/08/2017   Procedure: Bilateral Common Iliac Stent and Left Popliteal Artery Stent;  Surgeon: Maeola Harman, MD;  Location: [facility] OR;  Service: Vascular;  Laterality: Bilateral;   LOWER EXTREMITY ANGIOGRAM Bilateral 01/08/2017   Procedure: Bilateral Lower Extremity Angiogram;  Surgeon: Maeola Harman, MD;  Location: [facility] OR;  Service: Vascular;  Laterality: Bilateral;   PATCH ANGIOPLASTY  01/08/2017   Procedure: Patch Angioplasty Left Popliteal Artery ;  Surgeon: Maeola Harman, MD;  Location: [facility] OR;  Service: Vascular;;   TRANSTHORACIC ECHOCARDIOGRAM  12/2016   Normal EF 60-65%.  Essentially normal echocardiogram.  No significant valvular lesions.   Social History   Tobacco Use   Smoking status: Former    Current packs/day: 0.00    Average packs/day: 0.5 packs/day for 40.0 years (20.0 ttl pk-yrs)    Types: Cigarettes    Start date: 12/21/1976  Quit date: 12/21/2016    Years since quitting: 6.8   Smokeless tobacco: Never  Vaping Use   Vaping status: Never Used  Substance Use Topics   Alcohol use: No   Drug use: No   Family Status  Relation Name Status   Mother  Alive   Father  Alive   Brother  Alive   MGM  Deceased   MGF  Deceased   PGM  Deceased   PGF  Deceased  No partnership data on file   Allergies  Allergen Reactions   Clindamycin/Lincomycin Rash      Review of Systems  Constitutional:  Positive for weight loss.  HENT:  Positive for hearing loss.   Eyes: Negative.   Respiratory: Negative.    Cardiovascular: Negative.   Musculoskeletal: Negative.   Skin: Negative.   Neurological: Negative.   Psychiatric/Behavioral:  Positive for depression.  The patient is nervous/anxious and has insomnia.   All other systems reviewed and are negative.     Objective:     BP (!) 110/58 (BP Location: Left Arm, Patient Position: Sitting, Cuff Size: Small)   Pulse 96   Temp 98.5 F (36.9 C) (Temporal)   Ht 5\' 2"  (1.575 m)   Wt 75 lb 3.2 oz (34.1 kg)   SpO2 99%   BMI 13.75 kg/m  BP Readings from Last 3 Encounters:  10/11/23 (!) 110/58  02/02/23 94/60  03/16/22 122/70   Wt Readings from Last 3 Encounters:  10/11/23 75 lb 3.2 oz (34.1 kg)  02/02/23 63 lb 6.4 oz (28.8 kg)  03/16/22 102 lb 4.8 oz (46.4 kg)      Physical Exam Vitals and nursing note reviewed.  Constitutional:      Comments: Thin built  HENT:     Head: Normocephalic.     Mouth/Throat:     Mouth: Mucous membranes are moist.  Eyes:     Pupils: Pupils are equal, round, and reactive to light.  Cardiovascular:     Rate and Rhythm: Normal rate and regular rhythm.  Pulmonary:     Effort: Pulmonary effort is normal.     Breath sounds: Normal breath sounds.  Musculoskeletal:     Comments: LEFT ABOVE KNEE AMPUTATION  Neurological:     General: No focal deficit present.     Mental Status: She is alert.  Psychiatric:        Mood and Affect: Mood normal.      No results found for any visits on 10/11/23.  Last CBC Lab Results  Component Value Date   WBC 7.5 02/26/2021   HGB 13.1 02/26/2021   HCT 38.8 02/26/2021   MCV 92 02/26/2021   MCH 30.9 02/26/2021   RDW 12.5 02/26/2021   PLT 395 02/26/2021   Last hemoglobin A1c No results found for: "HGBA1C" Last thyroid functions Lab Results  Component Value Date   TSH 7.210 (H) 02/26/2021      The 10-year ASCVD risk score (Arnett DK, et al., 2019) is: 5.1%    Assessment & Plan:   Problem List Items Addressed This Visit     Peripheral artery disease (HCC) - Primary (Chronic)    History of ischemia in the left leg, leading to a above-knee amputation seven years ago. Significant weight loss affecting  prosthesis fit. Unsure if it an effect of her polypharmacy. - Monitor weight and nutritional status - Encourage use of protein supplements to aid weight gain      Relevant Orders   CBC with Differential/Platelet  Comprehensive metabolic panel   Hemoglobin A1c   Lipid panel   Microalbumin / creatinine urine ratio   T4, free   TSH   Hyperlipidemia LDL goal <70 (Chronic)    On atorvastatin for cholesterol management. - Continue atorvastatin 80 mg      Relevant Orders   CBC with Differential/Platelet   Comprehensive metabolic panel   Hemoglobin A1c   Lipid panel   Microalbumin / creatinine urine ratio   T4, free   TSH   Former heavy tobacco smoker (Chronic)    Quit 7 years ago, after her left leg amputation. Appreciated quitting      Benign essential hypertension   Relevant Orders   CBC with Differential/Platelet   Comprehensive metabolic panel   Hemoglobin A1c   Lipid panel   Microalbumin / creatinine urine ratio   T4, free   TSH   PTSD (post-traumatic stress disorder)    Managed with Paxil. Reported dizziness and confusion likely due to medication duplication. Poor sleep and appetite may benefit from mirtazapine. - Continue Paxil 40 mg - Discontinue buspirone and hydroxyzine at this time - I also did not find Seroquel in her pill bottles. I am not sure if she is taking it. But will discontinue it from the medication list for now. - mirtazapine is listed at 7.5 mg on her medication list, she has pills which are 45 mg dosage each but she is taking HALF OF 45 MG ie 22.5 mg daily. FOR NOW, I WILL HAVE HER CONTINUE 22.5 MG MIRTAZAPINE.  She sees [facility] and they do her medication management.      Relevant Medications   mirtazapine (REMERON SOL-TAB) 45 MG disintegrating tablet   Type 2 diabetes mellitus with other specified complication (HCC)    Confirmed by medical records. Currently on metformin ER 500 mg for blood sugar control. - Continue metformin 500 mg ER ONCE  DAILY - Order blood work including A1c      Relevant Orders   CBC with Differential/Platelet   Comprehensive metabolic panel   Hemoglobin A1c   Lipid panel   Microalbumin / creatinine urine ratio   T4, free   TSH   Dysphagia    Had esophageal dilatation in December 2023.        Hypothyroidism    I do not see her taking synthroid. It is on her medication list, but I did not find the pill bottle. Unsure if she has been taking it.  Will order TSH and T4 and make recommendation      Relevant Orders   CBC with Differential/Platelet   Comprehensive metabolic panel   Hemoglobin A1c   Lipid panel   Microalbumin / creatinine urine ratio   T4, free   TSH   Anxiety   Relevant Medications   mirtazapine (REMERON SOL-TAB) 45 MG disintegrating tablet   Polypharmacy    Patient has been taking the same medication from two different bottles, leading to potential overdosing, contributing to dizziness and confusion. Prefers taking medications directly from bottles rather than using pill packs. - Sorted and consolidated medications to avoid duplication - Discontinue buspirone as per her preference  - Continue Paxil 40 mg for anxiety and depression. -  Educated on proper medication management - Request pharmacy to provide clear labeling and instructions       Return in about 3 weeks (around 11/01/2023).    Windell Moment, MD

## 2023-10-11 NOTE — Assessment & Plan Note (Signed)
History of ischemia in the left leg, leading to a above-knee amputation seven years ago. Significant weight loss affecting prosthesis fit. Unsure if it an effect of her polypharmacy. - Monitor weight and nutritional status - Encourage use of protein supplements to aid weight gain

## 2023-10-11 NOTE — Assessment & Plan Note (Signed)
I do not see her taking synthroid. It is on her medication list, but I did not find the pill bottle. Unsure if she has been taking it.  Will order TSH and T4 and make recommendation

## 2023-10-11 NOTE — Addendum Note (Signed)
Addended by: Windell Moment on: 10/11/2023 04:09 PM   Modules accepted: Orders

## 2023-10-11 NOTE — Assessment & Plan Note (Signed)
Patient has been taking the same medication from two different bottles, leading to potential overdosing, contributing to dizziness and confusion. Prefers taking medications directly from bottles rather than using pill packs. - Sorted and consolidated medications to avoid duplication - Discontinue buspirone as per her preference  - Continue Paxil 40 mg for anxiety and depression. -  Educated on proper medication management - Request pharmacy to provide clear labeling and instructions

## 2023-10-11 NOTE — Assessment & Plan Note (Signed)
On atorvastatin for cholesterol management. - Continue atorvastatin 80 mg

## 2023-10-11 NOTE — Assessment & Plan Note (Signed)
Managed with Paxil. Reported dizziness and confusion likely due to medication duplication. Poor sleep and appetite may benefit from mirtazapine. - Continue Paxil 40 mg - Discontinue buspirone and hydroxyzine at this time - I also did not find Seroquel in her pill bottles. I am not sure if she is taking it. But will discontinue it from the medication list for now. - mirtazapine is listed at 7.5 mg on her medication list, she has pills which are 45 mg dosage each but she is taking HALF OF 45 MG ie 22.5 mg daily. FOR NOW, I WILL HAVE HER CONTINUE 22.5 MG MIRTAZAPINE.  She sees [facility] and they do her medication management.

## 2023-10-11 NOTE — Assessment & Plan Note (Signed)
Quit 7 years ago, after her left leg amputation. Appreciated quitting

## 2023-10-12 LAB — CBC WITH DIFFERENTIAL/PLATELET
Basophils Absolute: 0.1 10*3/uL (ref 0.0–0.2)
Basos: 1 %
EOS (ABSOLUTE): 0.1 10*3/uL (ref 0.0–0.4)
Eos: 2 %
Hematocrit: 40 % (ref 34.0–46.6)
Hemoglobin: 13 g/dL (ref 11.1–15.9)
Immature Grans (Abs): 0 10*3/uL (ref 0.0–0.1)
Immature Granulocytes: 0 %
Lymphocytes Absolute: 1.7 10*3/uL (ref 0.7–3.1)
Lymphs: 21 %
MCH: 29.9 pg (ref 26.6–33.0)
MCHC: 32.5 g/dL (ref 31.5–35.7)
MCV: 92 fL (ref 79–97)
Monocytes Absolute: 0.6 10*3/uL (ref 0.1–0.9)
Monocytes: 8 %
Neutrophils Absolute: 5.5 10*3/uL (ref 1.4–7.0)
Neutrophils: 68 %
Platelets: 341 10*3/uL (ref 150–450)
RBC: 4.35 x10E6/uL (ref 3.77–5.28)
RDW: 12.7 % (ref 11.7–15.4)
WBC: 8 10*3/uL (ref 3.4–10.8)

## 2023-10-12 LAB — HEMOGLOBIN A1C
Est. average glucose Bld gHb Est-mCnc: 100 mg/dL
Hgb A1c MFr Bld: 5.1 % (ref 4.8–5.6)

## 2023-10-12 LAB — COMPREHENSIVE METABOLIC PANEL
ALT: 23 [IU]/L (ref 0–32)
AST: 23 [IU]/L (ref 0–40)
Albumin: 4.8 g/dL (ref 3.8–4.9)
Alkaline Phosphatase: 106 [IU]/L (ref 44–121)
BUN/Creatinine Ratio: 12 (ref 12–28)
BUN: 21 mg/dL (ref 8–27)
Bilirubin Total: 0.3 mg/dL (ref 0.0–1.2)
CO2: 23 mmol/L (ref 20–29)
Calcium: 10.2 mg/dL (ref 8.7–10.3)
Chloride: 100 mmol/L (ref 96–106)
Creatinine, Ser: 1.81 mg/dL — ABNORMAL HIGH (ref 0.57–1.00)
Globulin, Total: 2.7 g/dL (ref 1.5–4.5)
Glucose: 72 mg/dL (ref 70–99)
Potassium: 4.8 mmol/L (ref 3.5–5.2)
Sodium: 140 mmol/L (ref 134–144)
Total Protein: 7.5 g/dL (ref 6.0–8.5)
eGFR: 32 mL/min/{1.73_m2} — ABNORMAL LOW (ref >59)

## 2023-10-12 LAB — LIPID PANEL
Chol/HDL Ratio: 3.1 ratio (ref 0.0–4.4)
Cholesterol, Total: 204 mg/dL — ABNORMAL HIGH (ref 100–199)
HDL: 66 mg/dL (ref >39)
LDL Chol Calc (NIH): 117 mg/dL — ABNORMAL HIGH (ref 0–99)
Triglycerides: 120 mg/dL (ref 0–149)
VLDL Cholesterol Cal: 21 mg/dL (ref 5–40)

## 2023-10-12 LAB — MICROALBUMIN / CREATININE URINE RATIO
Creatinine, Urine: 45.8 mg/dL
Microalb/Creat Ratio: <7 mg/g{creat} (ref 0–29)
Microalbumin, Urine: <3 ug/mL

## 2023-10-12 LAB — TSH: TSH: 6.83 u[IU]/mL — ABNORMAL HIGH (ref 0.450–4.500)

## 2023-10-12 LAB — T4, FREE: Free T4: 1.32 ng/dL (ref 0.82–1.77)

## 2023-10-31 ENCOUNTER — Telehealth: Payer: Self-pay

## 2023-10-31 NOTE — Telephone Encounter (Signed)
Needs PA for Nexlizet.  Key [id number] Last name: Alison Lynch  DOB: 1961/07/21

## 2023-11-01 ENCOUNTER — Ambulatory Visit: Payer: Medicare HMO

## 2023-11-01 ENCOUNTER — Other Ambulatory Visit (HOSPITAL_COMMUNITY): Payer: Self-pay

## 2023-11-01 ENCOUNTER — Telehealth: Payer: Self-pay | Admitting: Pharmacy Technician

## 2023-11-01 NOTE — Telephone Encounter (Signed)
Pharmacy Patient Advocate Encounter   Received notification from Pt Calls Messages that prior authorization for nexlizet is required/requested.   Insurance verification completed.   The patient is insured through [employer] .   Per test claim: PA required; PA submitted to above mentioned insurance via CoverMyMeds Key/confirmation #/EOC [id number] Status is pending

## 2023-11-02 ENCOUNTER — Other Ambulatory Visit (HOSPITAL_COMMUNITY): Payer: Self-pay

## 2023-11-02 ENCOUNTER — Telehealth: Payer: Self-pay

## 2023-11-02 NOTE — Telephone Encounter (Signed)
Copied from CRM [id number]. Topic: General - Other >> Nov 01, 2023  8:26 AM Donita Brooks wrote: Reason for CRM: pt would like for a nurse to call her about her husband medicine. Pt call back number [phone]

## 2023-11-02 NOTE — Telephone Encounter (Signed)
Pharmacy Patient Advocate Encounter  Received notification from [facility] that Prior Authorization for nexlizet has been APPROVED from 11/29/22 to 11/28/24. Ran test claim, Copay is $0.00- one month. This test claim was processed through [facility]- copay amounts may vary at other pharmacies due to pharmacy/plan contracts, or as the patient moves through the different stages of their insurance plan.   PA #/Case ID/Reference #: [id number]

## 2023-11-02 NOTE — Telephone Encounter (Signed)
Called patient wife back, unable to leave voicemail

## 2023-11-03 ENCOUNTER — Ambulatory Visit (INDEPENDENT_AMBULATORY_CARE_PROVIDER_SITE_OTHER): Payer: Medicare HMO

## 2023-11-03 ENCOUNTER — Telehealth: Payer: Self-pay

## 2023-11-03 VITALS — BP 102/60 | HR 100 | Temp 97.6°F | Resp 16 | Ht 62.0 in | Wt <= 1120 oz

## 2023-11-03 DIAGNOSIS — F4310 Post-traumatic stress disorder, unspecified: Secondary | ICD-10-CM | POA: Diagnosis not present

## 2023-11-03 DIAGNOSIS — N1831 Chronic kidney disease, stage 3a: Secondary | ICD-10-CM | POA: Diagnosis not present

## 2023-11-03 DIAGNOSIS — E039 Hypothyroidism, unspecified: Secondary | ICD-10-CM

## 2023-11-03 DIAGNOSIS — Z79899 Other long term (current) drug therapy: Secondary | ICD-10-CM

## 2023-11-03 DIAGNOSIS — R634 Abnormal weight loss: Secondary | ICD-10-CM

## 2023-11-03 DIAGNOSIS — S78112A Complete traumatic amputation at level between left hip and knee, initial encounter: Secondary | ICD-10-CM

## 2023-11-03 HISTORY — DX: Abnormal weight loss: R63.4

## 2023-11-03 HISTORY — DX: Other long term (current) drug therapy: Z79.899

## 2023-11-03 NOTE — Assessment & Plan Note (Addendum)
Complex medication regimen with potential for confusion and adverse effects. Issues with dizziness, nausea, and weight loss potentially related to medication use. Discontinued metformin due to excellent blood sugar control (A1c 5.1) and potential contribution to dizziness. Discontinued buspirone due to its potential to cause dizziness. Discussed the importance of avoiding polypharmacy. - Discontinue metformin due to excellent A1c  - Discontinue buspirone due to her dizziness.  - Continue atorvastatin 80 mg daily - Continue promethazine 12.5 mg as needed for nausea, vomiting, and dizziness - Continue omeprazole 40 mg daily - Continue levothyroxine 50 mcg daily - Continue mirtazapine 22.5 mg daily - Continue Paxil 40 mg daily - Fax updated medication list to pharmacy - Contact Diane Excell Seltzer at [facility] for coordination of care- I tried and left a message with [facility].    Well-controlled blood sugars with A1c of 5.1. Discontinued metformin due to excellent blood sugar control and potential contribution to dizziness. Discussed the importance of monitoring blood sugar levels to ensure continued control without metformin. - Discontinue metformin - Monitor blood sugar levels    Hyperlipidemia Hyperlipidemia managed with atorvastatin 80 mg daily. Last lipid panel showed total cholesterol of 204 and LDL of 117, an improvement from previous levels. Discussed the benefits of continuing atorvastatin to maintain lipid control. - Continue atorvastatin 80 mg daily  Hypothyroidism Hypothyroidism managed with levothyroxine 50 mcg daily. Plan to recheck thyroid blood work in January to ensure appropriate dosing. - Continue levothyroxine 50 mcg daily - Recheck thyroid blood work in January  General Health Maintenance Emphasis on avoiding polypharmacy and potential adverse effects. Discussed the importance of regular follow-ups and coordination with other healthcare providers to ensure comprehensive care. -  Fax updated medication list to pharmacy - Contact Diane Excell Seltzer at [facility] for coordination of care  Follow-up - Recheck thyroid blood work in January - Coordinate follow-up appointments with other healthcare providers to align with patient's schedule.

## 2023-11-03 NOTE — Progress Notes (Signed)
Subjective:  Patient ID: Alison Lynch, female    DOB: 06-01-1961  Age: 62 y.o. MRN: [id number]  Chief Complaint  Patient presents with   Medical Management of Chronic Issues    HPI   The patient, with a complex medical history including PTSD, diabetes, and thyroid disease, presents with a chief complaint of dizziness and weight loss. She reports feeling "yucky" and experiencing right shoulder pain, which she attributes to possibly lifting something heavy or sleeping in an incorrect position. The patient also mentions feeling dizzy, with a sensation of the room spinning, leading to instances where she has to lie down.  The patient has been experiencing significant weight loss over the past year, to the extent that she can no longer fit her left above-knee prosthesis. Despite undergoing a colonoscopy, upper G endoscopy, and esophageal dilatation, all results were normal. The patient also reports confusion with her medication regimen, as she was previously taking double doses of her medications due to misunderstanding her prescription instructions.  The patient's medication regimen was recently adjusted, with the discontinuation of buspirone and hydroxyzine for PTSD, and the continuation of Paxil 40mg  daily. She was also prescribed Seroquel, but it was not included in her medication list. The patient was taking half a pill of mirtazapine 45mg  for sleep and weight gain, as she has difficulty swallowing whole pills.  The patient's recent blood work showed slightly elevated thyroid levels, and she was unsure if she was prescribed levothyroxine . Her lipid panel showed a total cholesterol of 204 and LDL cholesterol of 117, indicating the need for continued atorvastatin 80mg  daily. Her blood sugar A1c was 5.1, suggesting well-controlled diabetes.  The patient also reports instances of nausea and vomiting, particularly after eating. She has multiple prescriptions for ondansetron, but has not taken  any. She also mentions a history of kidney disease and is under the care of a nephrologist. She is scheduled to see a provider at [facility] for management of her PTSD.     10/11/2023    2:35 PM 02/18/2017   12:48 PM  Depression screen PHQ 2/9  Decreased Interest 2 0  Down, Depressed, Hopeless 3 0  PHQ - 2 Score 5 0  Altered sleeping 3 3  Tired, decreased energy 2 1  Change in appetite 2 0  Feeling bad or failure about yourself  2 1  Trouble concentrating 1 0  Moving slowly or fidgety/restless 0 0  Suicidal thoughts 3 0  PHQ-9 Score 18 5  Difficult doing work/chores Somewhat difficult Not difficult at all        10/11/2023    2:34 PM  Fall Risk   Falls in the past year? 1  Number falls in past yr: 1  Injury with Fall? 0  Risk for fall due to : Impaired mobility  Follow up Falls evaluation completed    Patient Care Team: Windell Moment, MD as PCP - General (Family Medicine) Marykay Lex, MD as Consulting Physician (Cardiology)   Review of Systems  Constitutional:  Positive for unexpected weight change.  HENT: Negative.    Eyes: Negative.   Respiratory: Negative.    Cardiovascular: Negative.   Gastrointestinal:  Positive for constipation (mild).  Endocrine: Negative.   Musculoskeletal:  Positive for arthralgias (right shoulder).  Neurological: Negative.     Current Outpatient Medications on File Prior to Visit  Medication Sig Dispense Refill   aspirin EC 81 MG tablet Take 81 mg by mouth daily.     atorvastatin (LIPITOR) 80  MG tablet TAKE ONE TABLET BY MOUTH EVERY DAY 90 tablet 3   gabapentin (NEURONTIN) 400 MG capsule Take 400 mg by mouth 3 (three) times daily.     levothyroxine (SYNTHROID) 50 MCG tablet Take 50 mcg by mouth daily before breakfast.     mirtazapine (REMERON SOL-TAB) 45 MG disintegrating tablet Take 22.5 mg by mouth at bedtime.     nitroGLYCERIN (NITROSTAT) 0.4 MG SL tablet Place 1 tablet (0.4 mg total) under the tongue every 5 (five) minutes as  needed for chest pain. 25 tablet 11   omeprazole (PRILOSEC) 40 MG capsule Take 40 mg by mouth daily.     ondansetron (ZOFRAN) 8 MG tablet Take 8 mg by mouth every 8 (eight) hours as needed for nausea or vomiting.     PROLIA 60 MG/ML SOSY injection Inject 60 mg into the skin every 6 (six) months.     promethazine (PHENERGAN) 12.5 MG tablet Take 1 tablet (12.5 mg total) by mouth every 6 (six) hours as needed for nausea or vomiting. 30 tablet 2   rivaroxaban (XARELTO) 2.5 MG TABS tablet Take 2.5 mg by mouth daily.     No current facility-administered medications on file prior to visit.   Past Medical History:  Diagnosis Date   Above knee amputation of left lower extremity (HCC) 01/13/2017   Acquired absence of left leg above knee (HCC) 01/13/2017   Acute renal failure syndrome (HCC) 06/18/2020   AKI (acute kidney injury) (HCC)    Anxiety 06/18/2020   Aortobifemoral bypass graft thrombosis, initial encounter (HCC) 04/19/2018   Aortoiliac occlusive disease (HCC) 05/30/2017   Arterial graft thrombosis (HCC) 04/19/2018   Arthrodesis status 05/18/2016   Atherosclerosis of aorta (HCC) 06/18/2020   Benign essential hypertension 06/18/2020   Formatting of this note might be different from the original. Last Assessment & Plan:  Formatting of this note might be different from the original. Clearly not hypertensive.  Not on any medications.   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 Last Assessment & Plan:  Formatting of this note might be different from the original. Followed by Dr.   Earlyne Iba surgeries.  Now doing well.  Trying to get up and going with her prosthesis.  we need to be following her lipids. Smoking cessation counseling.   Former heavy tobacco smoker 07/03/2017   GERD (gastroesophageal reflux disease) 11/16/2018   Hx of migraine headaches    Hyperlipidemia    Hyperlipidemia LDL goal <70 05/12/2017   Hypertension 11/16/2018   Hypothyroid    Hypothyroidism 11/16/2018   Ischemia of left lower extremity 01/08/2017   Formatting of this note might be different from the original. Last Assessment & Plan:  Formatting of this note might be different from the original. Status post left AKA, now with left lower extremity ischemia. Pending aortobifem bypass. The urgent procedure.   Ischemia of lower extremity 01/08/2017   Leriche syndrome (HCC) 12/2016   Chronically occluded aorta   Leukocytosis 06/18/2020   Osteoporosis    PAD (peripheral artery disease) (HCC)  Status post left AKA for extensive thromboembolism, right leg has in-line flow through popliteal artery only.  Bilateral iliac stents occluded   Panic attacks    Peripheral artery disease (HCC) 05/12/2017   Peripheral vascular disease (HCC) 05/12/2017   Formatting of this note might be different from the original. Last Assessment & Plan:  Formatting of this note might be different from the original. At this point I want to let her recover from her surgery and her amputation.  She is asked to follow-up in the [place] office.  My plan was for her to be seen in in 1year once our [place] office reopens.  She would need to be reassigned to a n   Personal history of other diseases of the nervous system and sense organs 06/16/2021   Phantom limb syndrome with pain (HCC) 06/18/2020   Pre-operative cardiovascular examination 05/12/2017   PTSD (post-traumatic stress disorder)    Pure hypertriglyceridemia 06/18/2020   S/P cervical spinal fusion 05/18/2016   Shortness of breath 11/16/2018   Overview:   exertional   Formatting of this note might be different from the original. exertional Formatting of this note might be different from the original. Formatting of this note might be different from the original. exertional Formatting of this note might be different from the original. Overview:  exertional   Stage 3 chronic kidney disease (HCC) 06/23/2020   Thrombocytopenia (HCC) 06/18/2020   Type 2 diabetes mellitus with other specified complication (HCC) 11/16/2018   Vitamin D deficiency, unspecified 06/23/2020   Wound infection 05/20/2018   Past Surgical History:  Procedure Laterality Date   ABDOMINAL AORTIC ANEURYSM REPAIR N/A 04/19/2018   Procedure: AORTIC EMBOLECTOMY AORTOGRAM;  Surgeon: Larina Earthly, MD;  Location: [facility] OR;  Service: Vascular;  Laterality: N/A;   ABDOMINAL AORTOGRAM N/A 04/19/2018   Procedure: ABDOMINAL AORTOGRAM;  Surgeon: Larina Earthly, MD;  Location: [facility] OR;  Service: Vascular;  Laterality: N/A;   ABDOMINAL AORTOGRAM W/LOWER EXTREMITY Right 04/27/2017   Procedure: Abdominal Aortogram w/Lower Extremity;  Surgeon: Maeola Harman, MD;  Location: [facility] INVASIVE CV LAB;  Service: Cardiovascular;  Laterality: Right: Bilateral iliac stents occluded. Normal aortic flow with patent hypogastric and renal arteries. Femoral arteries patent by ultrasound; right leg and showed below-knee runoff -> plan Aortobifem Bypass   AMPUTATION Left 01/09/2017   Procedure: AMPUTATION ABOVE KNEE;  Surgeon: Maeola Harman, MD;  Location: [facility] OR;  Service: Vascular;  Laterality: Left;   AORTA - BILATERAL FEMORAL ARTERY BYPASS GRAFT N/A 05/30/2017   Procedure: AORTA BIFEMORAL BYPASS GRAFT USING [id number] HEMASHIELD GOLD GRAFT;  Surgeon: Maeola Harman, MD;  Location: [facility] OR;  Service: Vascular;  Laterality: N/A;   AORTOGRAM Left 01/08/2017   Procedure: Ultrasound Guided Cannulation Left Common Femoral Artery; Aortagram;  Surgeon: Maeola Harman, MD;  Location: [facility] OR;   Service: Vascular;  Laterality: Left;   ARTERY EXPLORATION Left 01/08/2017   Procedure: Left Common Femoral Artery Exploration;  Surgeon: Maeola Harman, MD;  Location: [facility] OR;  Service: Vascular;  Laterality: Left;   BACK SURGERY     EMBOLECTOMY Left 01/08/2017   Procedure: Left Lower Extremity Thromboembolectomy;  Surgeon: Maeola Harman, MD;  Location: [facility] OR;  Service: Vascular;  Laterality: Left;   ENDARTERECTOMY POPLITEAL Left 01/08/2017   Procedure: Left Popliteal Endarterectomy;  Surgeon: Maeola Harman, MD;  Location[facility] OR;  Service: Vascular;  Laterality: Left;   FEMORAL ARTERY EXPLORATION Bilateral 05/20/2018   Procedure: FEMORAL EXPLORATION, RIGHT;  Surgeon: Arbie Cookey,  Kristen Loader, MD;  Location: [facility] OR;  Service: Vascular;  Laterality: Bilateral;   INSERTION OF ILIAC STENT Bilateral 01/08/2017   Procedure: Bilateral Common Iliac Stent and Left Popliteal Artery Stent;  Surgeon: Maeola Harman, MD;  Location: [facility] OR;  Service: Vascular;  Laterality: Bilateral;   LOWER EXTREMITY ANGIOGRAM Bilateral 01/08/2017   Procedure: Bilateral Lower Extremity Angiogram;  Surgeon: Maeola Harman, MD;  Location: [facility] OR;  Service: Vascular;  Laterality: Bilateral;   PATCH ANGIOPLASTY  01/08/2017   Procedure: Patch Angioplasty Left Popliteal Artery ;  Surgeon: Maeola Harman, MD;  Location: [facility] OR;  Service: Vascular;;   TRANSTHORACIC ECHOCARDIOGRAM  12/2016   Normal EF 60-65%.  Essentially normal echocardiogram.  No significant valvular lesions.    Family History  Problem Relation Age of Onset   Cancer Maternal Grandmother    Heart disease Maternal Grandfather        He died at 37. Unknown what heart disease   Social History   Socioeconomic History   Marital status: Married    Spouse name: Not on file   Number of children: Not on file   Years of education: Not on file   Highest education level: Not on file  Occupational History   Not on file   Tobacco Use   Smoking status: Former    Current packs/day: 0.00    Average packs/day: 0.5 packs/day for 40.0 years (20.0 ttl pk-yrs)    Types: Cigarettes    Start date: 12/21/1976    Quit date: 12/21/2016    Years since quitting: 6.8   Smokeless tobacco: Never  Vaping Use   Vaping status: Never Used  Substance and Sexual Activity   Alcohol use: No   Drug use: No   Sexual activity: Not Currently  Other Topics Concern   Not on file  Social History Narrative   She is married. No kids. Currently disabled. No longer working. She still smokes at least a pack a day, but is hoping to quit with patches. Does not drink alcohol or use illicit drugs   Social Determinants of Health   Financial Resource Strain: Not on file  Food Insecurity: Medium Risk (04/11/2023)   [facility]   Hunger Vital Sign    Worried About Running Out of Food in the Last Year: Never true    Ran Out of Food in the Last Year: Sometimes true  Transportation Needs: Not on file (04/11/2023)  Physical Activity: Not on file  Stress: Not on file  Social Connections: Not on file    Objective:  BP 102/60 (BP Location: Right Arm, Patient Position: Sitting, Cuff Size: Small)   Pulse 100   Temp 97.6 F (36.4 C) (Temporal)   Resp 16   Ht 5\' 2"  (1.575 m)   Wt 64 lb 12.8 oz (29.4 kg)   SpO2 99%   BMI 11.85 kg/m      11/03/2023    2:04 PM 10/11/2023    2:32 PM 02/02/2023    3:02 PM  BP/Weight  Systolic BP 102 110 94  Diastolic BP 60 58 60  Wt. (Lbs) 64.8 75.2 63.4  BMI 11.85 kg/m2 13.75 kg/m2 11.6 kg/m2    Physical Exam Vitals and nursing note reviewed.  Constitutional:      Comments: Thin built  HENT:     Head: Normocephalic and atraumatic.     Mouth/Throat:     Comments: No teeth upper jaw Several missing on lower jaw Cardiovascular:  Rate and Rhythm: Normal rate and regular rhythm.  Pulmonary:     Effort: Pulmonary effort is normal.     Breath sounds: Normal breath  sounds.  Musculoskeletal:     Comments: Left above knee amputation  Neurological:     Mental Status: She is alert.     Diabetic Foot Exam - Simple   No data filed      Lab Results  Component Value Date   WBC 8.0 10/11/2023   HGB 13.0 10/11/2023   HCT 40.0 10/11/2023   PLT 341 10/11/2023   GLUCOSE 72 10/11/2023   CHOL 204 (H) 10/11/2023   TRIG 120 10/11/2023   HDL 66 10/11/2023   LDLCALC 117 (H) 10/11/2023   ALT 23 10/11/2023   AST 23 10/11/2023   NA 140 10/11/2023   K 4.8 10/11/2023   CL 100 10/11/2023   CREATININE 1.81 (H) 10/11/2023   BUN 21 10/11/2023   CO2 23 10/11/2023   TSH 6.830 (H) 10/11/2023   INR 1.41 05/22/2018   HGBA1C 5.1 10/11/2023      Assessment & Plan:    Stage 3a chronic kidney disease (HCC)  Medication management Assessment & Plan: Complex medication regimen with potential for confusion and adverse effects. Issues with dizziness, nausea, and weight loss potentially related to medication use. Discontinued metformin due to excellent blood sugar control (A1c 5.1) and potential contribution to dizziness. Discontinued buspirone due to its potential to cause dizziness. Discussed the importance of avoiding polypharmacy. - Discontinue metformin due to excellent A1c  - Discontinue buspirone due to her dizziness.  - Continue atorvastatin 80 mg daily - Continue promethazine 12.5 mg as needed for nausea, vomiting, and dizziness - Continue omeprazole 40 mg daily - Continue levothyroxine 50 mcg daily - Continue mirtazapine 22.5 mg daily - Continue Paxil 40 mg daily - Fax updated medication list to pharmacy - Contact Diane Excell Seltzer at [facility] for coordination of care- I tried and left a message with [facility].    Well-controlled blood sugars with A1c of 5.1. Discontinued metformin due to excellent blood sugar control and potential contribution to dizziness. Discussed the importance of monitoring blood sugar levels to ensure continued control without  metformin. - Discontinue metformin - Monitor blood sugar levels    Hyperlipidemia Hyperlipidemia managed with atorvastatin 80 mg daily. Last lipid panel showed total cholesterol of 204 and LDL of 117, an improvement from previous levels. Discussed the benefits of continuing atorvastatin to maintain lipid control. - Continue atorvastatin 80 mg daily  Hypothyroidism Hypothyroidism managed with levothyroxine 50 mcg daily. Plan to recheck thyroid blood work in January to ensure appropriate dosing. - Continue levothyroxine 50 mcg daily - Recheck thyroid blood work in January  General Health Maintenance Emphasis on avoiding polypharmacy and potential adverse effects. Discussed the importance of regular follow-ups and coordination with other healthcare providers to ensure comprehensive care. - Fax updated medication list to pharmacy - Contact Diane Excell Seltzer at [facility] for coordination of care  Follow-up - Recheck thyroid blood work in January - Coordinate follow-up appointments with other healthcare providers to align with patient's schedule.   Unintended weight loss Assessment & Plan: Unclear etiology. Unsure if it was due to her poly pharmacy Will continue to monitor   PTSD (post-traumatic stress disorder) Assessment & Plan: Continue current meds- paxil 40 mg daily and mirtazapine 22.5 mg daily.  Continue follow up with [facility]    Above knee amputation of left lower extremity [facility]) Assessment & Plan: Secondary to vascular disease. Has prosthesis but  unfortunately she is unable to fit it at this time due to weight loss. In a wheel chair, manual which she self propels.   Acquired hypothyroidism Assessment & Plan: Currently seems to be taking 50 mcg levothyroxine Will repeat labs next visit.      No orders of the defined types were placed in this encounter.   No orders of the defined types were placed in this encounter.    Follow-up: No follow-ups on  file.   I,Angela Taylor,acting as a Neurosurgeon for [employer], MD.,have documented all relevant documentation on the behalf of Windell Moment, MD,as directed by  Windell Moment, MD while in the presence of Windell Moment, MD.   An After Visit Summary was printed and given to the patient.  Windell Moment, MD [facility] [phone]

## 2023-11-03 NOTE — Telephone Encounter (Signed)
Faxed was fax to [phone] Emelia Loron due to fax number being cut off  Copied from CRM [id number]. Topic: Clinical - Medication Question >> Nov 03, 2023  3:23 PM Thomes Dinning wrote: Reason for CRM: Marisa Severin from [facility] services needs Fax# [phone] updated med list for PT

## 2023-11-03 NOTE — Patient Instructions (Addendum)
I stopped your metformin as your blood sugars are excellent and you complain of dizziness and weight loss. I stopped your buspirone as the medication is notorious for dizziness I will try to get in touch with Tamera Stands at [facility] Only take the medications that you have in the bag for now. Make an appt to see me on the day your husband comes.

## 2023-11-04 NOTE — Assessment & Plan Note (Signed)
Continue current meds- paxil 40 mg daily and mirtazapine 22.5 mg daily.  Continue follow up with [facility]

## 2023-11-04 NOTE — Assessment & Plan Note (Signed)
Unclear etiology. Unsure if it was due to her poly pharmacy Will continue to monitor

## 2023-11-06 NOTE — Assessment & Plan Note (Signed)
Currently seems to be taking 50 mcg levothyroxine Will repeat labs next visit.

## 2023-11-06 NOTE — Assessment & Plan Note (Signed)
Secondary to vascular disease. Has prosthesis but unfortunately she is unable to fit it at this time due to weight loss. In a wheel chair, manual which she self propels.

## 2023-11-18 ENCOUNTER — Other Ambulatory Visit: Payer: Self-pay

## 2023-11-18 MED ORDER — ONDANSETRON HCL 8 MG PO TABS: 8.0000 mg | ORAL_TABLET | Freq: Three times a day (TID) | ORAL | 0 refills | Status: DC | PRN

## 2023-11-18 MED ORDER — GABAPENTIN 400 MG PO CAPS: 400.0000 mg | ORAL_CAPSULE | Freq: Three times a day (TID) | ORAL | 1 refills | Status: DC

## 2023-11-18 MED ORDER — OMEPRAZOLE 40 MG PO CPDR: 40.0000 mg | DELAYED_RELEASE_CAPSULE | Freq: Every day | ORAL | 1 refills | Status: DC

## 2023-11-18 NOTE — Telephone Encounter (Signed)
Copied from CRM [id number]. Topic: Clinical - Medication Refill >> Nov 18, 2023 10:18 AM Dollene Primrose wrote: Most Recent Primary Care Visit:  Provider: Windell Moment  Department: [facility]  Visit Type: ACUTE  Date: 11/03/2023  Medication:  1-omeprazole (PRILOSEC) 40 MG capsule  2-gabapentin (NEURONTIN) 400 MG capsule  3-ondansetron (ZOFRAN) 8 MG tablet   Has the patient contacted their pharmacy? Yes-needs refills sent (Agent: If no, request that the patient contact the pharmacy for the refill. If patient does not wish to contact the pharmacy document the reason why and proceed with request.) (Agent: If yes, when and what did the pharmacy advise?)  Is this the correct pharmacy for this prescription? yes If no, delete pharmacy and type the correct one.  This is the patient's preferred pharmacy:   [facility] - [place], [place] - [address] [place] [address] Phone: [phone] Fax: [phone]     Has the prescription been filled recently? yes  Is the patient out of the medication? yes  Has the patient been seen for an appointment in the last year OR does the patient have an upcoming appointment? yes  Can we respond through MyChart? yes  Agent: Please be advised that Rx refills may take up to 3 business days. We ask that you follow-up with your pharmacy.

## 2023-12-06 DIAGNOSIS — F17211 Nicotine dependence, cigarettes, in remission: Secondary | ICD-10-CM | POA: Diagnosis not present

## 2023-12-06 DIAGNOSIS — N1832 Chronic kidney disease, stage 3b: Secondary | ICD-10-CM | POA: Diagnosis not present

## 2023-12-06 DIAGNOSIS — E1122 Type 2 diabetes mellitus with diabetic chronic kidney disease: Secondary | ICD-10-CM | POA: Diagnosis not present

## 2023-12-06 DIAGNOSIS — E785 Hyperlipidemia, unspecified: Secondary | ICD-10-CM | POA: Diagnosis not present

## 2023-12-06 DIAGNOSIS — R2681 Unsteadiness on feet: Secondary | ICD-10-CM | POA: Diagnosis not present

## 2023-12-06 DIAGNOSIS — Z008 Encounter for other general examination: Secondary | ICD-10-CM | POA: Diagnosis not present

## 2023-12-06 DIAGNOSIS — F4310 Post-traumatic stress disorder, unspecified: Secondary | ICD-10-CM | POA: Diagnosis not present

## 2023-12-06 DIAGNOSIS — Z681 Body mass index (BMI) 19 or less, adult: Secondary | ICD-10-CM | POA: Diagnosis not present

## 2023-12-06 DIAGNOSIS — R636 Underweight: Secondary | ICD-10-CM | POA: Diagnosis not present

## 2023-12-06 DIAGNOSIS — E1169 Type 2 diabetes mellitus with other specified complication: Secondary | ICD-10-CM | POA: Diagnosis not present

## 2024-01-13 ENCOUNTER — Other Ambulatory Visit: Payer: Self-pay

## 2024-01-16 ENCOUNTER — Ambulatory Visit: Payer: Medicare HMO

## 2024-01-16 ENCOUNTER — Ambulatory Visit (INDEPENDENT_AMBULATORY_CARE_PROVIDER_SITE_OTHER): Payer: No Typology Code available for payment source

## 2024-01-16 VITALS — BP 150/60 | HR 97 | Temp 97.8°F | Ht 62.0 in | Wt 87.0 lb

## 2024-01-16 DIAGNOSIS — Z23 Encounter for immunization: Secondary | ICD-10-CM

## 2024-01-16 DIAGNOSIS — Z1211 Encounter for screening for malignant neoplasm of colon: Secondary | ICD-10-CM

## 2024-01-16 DIAGNOSIS — Z0000 Encounter for general adult medical examination without abnormal findings: Secondary | ICD-10-CM

## 2024-01-16 DIAGNOSIS — E118 Type 2 diabetes mellitus with unspecified complications: Secondary | ICD-10-CM

## 2024-01-16 DIAGNOSIS — Z1231 Encounter for screening mammogram for malignant neoplasm of breast: Secondary | ICD-10-CM

## 2024-01-16 DIAGNOSIS — N1831 Chronic kidney disease, stage 3a: Secondary | ICD-10-CM | POA: Diagnosis not present

## 2024-01-16 DIAGNOSIS — E039 Hypothyroidism, unspecified: Secondary | ICD-10-CM | POA: Diagnosis not present

## 2024-01-16 DIAGNOSIS — Z87891 Personal history of nicotine dependence: Secondary | ICD-10-CM | POA: Diagnosis not present

## 2024-01-16 DIAGNOSIS — I739 Peripheral vascular disease, unspecified: Secondary | ICD-10-CM | POA: Diagnosis not present

## 2024-01-16 HISTORY — DX: Encounter for screening for malignant neoplasm of colon: Z12.11

## 2024-01-16 HISTORY — DX: Encounter for general adult medical examination without abnormal findings: Z00.00

## 2024-01-16 HISTORY — DX: Encounter for screening mammogram for malignant neoplasm of breast: Z12.31

## 2024-01-16 HISTORY — DX: Encounter for immunization: Z23

## 2024-01-16 HISTORY — DX: Type 2 diabetes mellitus with unspecified complications: E11.8

## 2024-01-16 NOTE — Patient Instructions (Addendum)
Alison Lynch , Thank you for taking time to come for your Medicare Wellness Visit. I appreciate your ongoing commitment to your health goals. Please review the following plan we discussed and let me know if I can assist you in the future.   These are the goals we discussed:  VISIT SUMMARY:  Today, you had a Medicare wellness visit where we discussed your recent weight gain, blood pressure, diabetes management, shoulder pain, and general health maintenance. We also talked about your goals of care and the importance of completing advance directives.  YOUR PLAN:  -MEDICARE WELLNESS VISIT: This is a routine check-up to review your overall health. We discussed your weight gain and provided guidance on healthy ways to gain weight, such as eating more fruits, vegetables, and cheese. We also checked your blood pressure, which was elevated.  -HYPERTENSION: Hypertension means high blood pressure. We noted that your blood pressure was elevated today, so we will monitor it closely.  -ABOVE-KNEE AMPUTATION: This refers to the surgical removal of your leg above the knee. You mentioned difficulty with your prosthesis fitting due to weight loss, so we encourage you to gain weight to improve the fit.  -SHOULDER PAIN: You have ongoing shoulder pain, which might be due to arthritis or bursitis. We will evaluate your shoulder during the physical exam.  -DIABETES MELLITUS: Diabetes is a condition where your blood sugar levels are too high. Your diabetes is well-controlled with an A1c of 5.1, and you have no symptoms of nerve damage in your right foot.  -GENERAL HEALTH MAINTENANCE: Routine health maintenance includes vaccinations and screenings. We will order a mammogram, colonoscopy, and low-dose lung cancer screening CT scan. You will receive a flu shot today, and we will check the availability of the pneumonia vaccine. We recommend getting tetanus and shingles vaccines at the pharmacy. We will schedule a Pap smear  and foot exam for your next visit, and you should see an eye doctor if you haven't had an exam in over a year.  -GOALS OF CARE: We discussed your preferences for medical treatment in case of serious illness. You prefer to avoid life-sustaining measures like mechanical ventilation or tube feeding. We will provide you with advance directive paperwork to complete.  INSTRUCTIONS:  Please schedule follow-up appointments for your Pap smear and foot exam. Complete your blood work today. Follow up with the appointments for your CT scan, mammogram, and colonoscopy. Make sure to visit an eye doctor if your last exam was over a year ago.  Goals   None     This is a list of the screening recommended for you and due dates:  Health Maintenance  Topic Date Due   Pneumococcal Vaccination (1 of 2 - PCV) Never done   Complete foot exam   Never done   Hepatitis C Screening  Never done   DTaP/Tdap/Td vaccine (1 - Tdap) Never done   Zoster (Shingles) Vaccine (1 of 2) Never done   Pap with HPV screening  Never done   Colon Cancer Screening  Never done   Screening for Lung Cancer  Never done   Mammogram  Never done   COVID-19 Vaccine (3 - Moderna risk series) 08/06/2020   Eye exam for diabetics  02/23/2023   Flu Shot  06/30/2023   Hemoglobin A1C  04/09/2024   Yearly kidney function blood test for diabetes  10/10/2024   Yearly kidney health urinalysis for diabetes  10/10/2024   Medicare Annual Wellness Visit  01/15/2025   HIV Screening  Completed   HPV Vaccine  Aged Out

## 2024-01-16 NOTE — Progress Notes (Signed)
Subjective:   Alison Lynch is a 63 y.o. female who presents for Medicare Annual (Subsequent) preventive examination.  Visit Complete: In person  Patient Medicare AWV questionnaire was completed by the patient; I have confirmed that all information answered by patient is correct and no changes since this date.  Cardiac Risk Factors include: none     Objective:    Today's Vitals   01/16/24 1316 01/16/24 1410  BP: (!) 160/60 (!) 150/60  Pulse: 97   Temp: 97.8 F (36.6 C)   SpO2: 98%   Weight: 87 lb (39.5 kg)   Height: 5\' 2"  (1.575 m)    Body mass index is 15.91 kg/m.     01/16/2024    1:31 PM 06/09/2018   11:56 AM 05/20/2018    1:42 AM 05/05/2018    9:24 AM 04/20/2018    4:54 PM 05/30/2017    2:15 PM 05/24/2017    2:09 PM  Advanced Directives  Does Patient Have a Medical Advance Directive? No No No No No No No  Would patient like information on creating a medical advance directive?   No - Patient declined  No - Patient declined No - Patient declined     Current Medications (verified) Outpatient Encounter Medications as of 01/16/2024  Medication Sig   aspirin EC 81 MG tablet Take 81 mg by mouth daily.   atorvastatin (LIPITOR) 80 MG tablet TAKE ONE TABLET BY MOUTH EVERY DAY   gabapentin (NEURONTIN) 400 MG capsule Take 1 capsule (400 mg total) by mouth 3 (three) times daily.   levothyroxine (SYNTHROID) 50 MCG tablet Take 50 mcg by mouth daily before breakfast.   mirtazapine (REMERON SOL-TAB) 45 MG disintegrating tablet Take 22.5 mg by mouth at bedtime.   nitroGLYCERIN (NITROSTAT) 0.4 MG SL tablet Place 1 tablet (0.4 mg total) under the tongue every 5 (five) minutes as needed for chest pain.   omeprazole (PRILOSEC) 40 MG capsule Take 1 capsule (40 mg total) by mouth daily.   ondansetron (ZOFRAN) 8 MG tablet Take 1 tablet (8 mg total) by mouth every 8 (eight) hours as needed for nausea or vomiting.   PROLIA 60 MG/ML SOSY injection Inject 60 mg into the skin every 6 (six) months.    promethazine (PHENERGAN) 12.5 MG tablet Take 1 tablet (12.5 mg total) by mouth every 6 (six) hours as needed for nausea or vomiting.   rivaroxaban (XARELTO) 2.5 MG TABS tablet Take 2.5 mg by mouth daily.   No facility-administered encounter medications on file as of 01/16/2024.    Allergies (verified) Clindamycin and Clindamycin/lincomycin   History: Past Medical History:  Diagnosis Date   Above knee amputation of left lower extremity (HCC) 01/13/2017   Acquired absence of left leg above knee (HCC) 01/13/2017   Acute renal failure syndrome (HCC) 06/18/2020   AKI (acute kidney injury) (HCC)    Anxiety 06/18/2020   Aortobifemoral bypass graft thrombosis, initial encounter (HCC) 04/19/2018   Aortoiliac occlusive disease (HCC) 05/30/2017   Arterial graft thrombosis (HCC) 04/19/2018   Arthrodesis status 05/18/2016   Atherosclerosis of aorta (HCC) 06/18/2020   Benign essential hypertension 06/18/2020   Formatting of this note might be different from the original. Last Assessment & Plan:  Formatting of this note might be different from the original. Clearly not hypertensive.  Not on any medications.   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 Last Assessment & Plan:  Formatting of this note might be different from the original. Followed by Dr.  Multiple surgeries.  Now doing well.  Trying to get up and going with her prosthesis.  we need to be following her lipids. Smoking cessation counseling.   Former heavy tobacco smoker 07/03/2017   GERD (gastroesophageal reflux disease)  11/16/2018   Hx of migraine headaches    Hyperlipidemia    Hyperlipidemia LDL goal <70 05/12/2017   Hypertension 11/16/2018   Hypothyroid    Hypothyroidism 11/16/2018   Ischemia of left lower extremity 01/08/2017   Formatting of this note might be different from the original. Last Assessment & Plan:  Formatting of this note might be different from the original. Status post left AKA, now with left lower extremity ischemia. Pending aortobifem bypass. The urgent procedure.   Ischemia of lower extremity 01/08/2017   Leriche syndrome (HCC) 12/2016   Chronically occluded aorta   Leukocytosis 06/18/2020   Osteoporosis    PAD (peripheral artery disease) (HCC)    Status post left AKA for extensive thromboembolism, right leg has in-line flow through popliteal artery only.  Bilateral iliac stents occluded   Panic attacks    Peripheral artery disease (HCC) 05/12/2017   Peripheral vascular disease (HCC) 05/12/2017   Formatting of this note might be different from the original. Last Assessment & Plan:  Formatting of this note might be different from the original. At this point I want to let her recover from her surgery and her amputation.  She is asked to follow-up in the [place] point office.  My plan was for her to be seen in in 1year once our [place] office reopens.  She would need to be reassigned to a n   Personal history of other diseases of the nervous system and sense organs 06/16/2021   Phantom limb syndrome with pain (HCC) 06/18/2020   Pre-operative cardiovascular examination 05/12/2017   PTSD (post-traumatic stress disorder)    Pure hypertriglyceridemia 06/18/2020   S/P cervical spinal fusion 05/18/2016   Shortness of breath 11/16/2018   Overview:  exertional   Formatting of this note might be different from the original. exertional Formatting of this note might be different from the original. Formatting of this note might be different from the original. exertional Formatting of this note  might be different from the original. Overview:  exertional   Stage 3 chronic kidney disease (HCC) 06/23/2020   Thrombocytopenia (HCC) 06/18/2020   Type 2 diabetes mellitus with other specified complication (HCC) 11/16/2018   Vitamin D deficiency, unspecified 06/23/2020   Wound infection 05/20/2018   Past Surgical History:  Procedure Laterality Date   ABDOMINAL AORTIC ANEURYSM REPAIR N/A 04/19/2018   Procedure: AORTIC EMBOLECTOMY AORTOGRAM;  Surgeon: Larina Earthly, MD;  Location: [facility] OR;  Service: Vascular;  Laterality: N/A;   ABDOMINAL AORTOGRAM N/A 04/19/2018   Procedure: ABDOMINAL AORTOGRAM;  Surgeon: Larina Earthly, MD;  Location: [facility] OR;  Service: Vascular;  Laterality: N/A;   ABDOMINAL AORTOGRAM W/LOWER EXTREMITY Right 04/27/2017   Procedure: Abdominal Aortogram w/Lower Extremity;  Surgeon: Maeola Harman, MD;  Location: [facility] INVASIVE CV LAB;  Service: Cardiovascular;  Laterality: Right: Bilateral iliac  stents occluded. Normal aortic flow with patent hypogastric and renal arteries. Femoral arteries patent by ultrasound; right leg and showed below-knee runoff -> plan Aortobifem Bypass   AMPUTATION Left 01/09/2017   Procedure: AMPUTATION ABOVE KNEE;  Surgeon: Maeola Harman, MD;  Location: [facility] OR;  Service: Vascular;  Laterality: Left;   AORTA - BILATERAL FEMORAL ARTERY BYPASS GRAFT N/A 05/30/2017   Procedure: AORTA BIFEMORAL BYPASS GRAFT USING [id number] HEMASHIELD GOLD GRAFT;  Surgeon: Maeola Harman, MD;  Location: [facility] OR;  Service: Vascular;  Laterality: N/A;   AORTOGRAM Left 01/08/2017   Procedure: Ultrasound Guided Cannulation Left Common Femoral Artery; Aortagram;  Surgeon: Maeola Harman, MD;  Location: [facility] OR;  Service: Vascular;  Laterality: Left;   ARTERY EXPLORATION Left 01/08/2017   Procedure: Left Common Femoral Artery Exploration;  Surgeon: Maeola Harman, MD;  Location: [facility] OR;  Service: Vascular;  Laterality: Left;   BACK SURGERY      EMBOLECTOMY Left 01/08/2017   Procedure: Left Lower Extremity Thromboembolectomy;  Surgeon: Maeola Harman, MD;  Location: [facility] OR;  Service: Vascular;  Laterality: Left;   ENDARTERECTOMY POPLITEAL Left 01/08/2017   Procedure: Left Popliteal Endarterectomy;  Surgeon: Maeola Harman, MD;  Location: [facility] OR;  Service: Vascular;  Laterality: Left;   FEMORAL ARTERY EXPLORATION Bilateral 05/20/2018   Procedure: FEMORAL EXPLORATION, RIGHT;  Surgeon: Larina Earthly, MD;  Location: [facility] OR;  Service: Vascular;  Laterality: Bilateral;   INSERTION OF ILIAC STENT Bilateral 01/08/2017   Procedure: Bilateral Common Iliac Stent and Left Popliteal Artery Stent;  Surgeon: Maeola Harman, MD;  Location: [facility] OR;  Service: Vascular;  Laterality: Bilateral;   LOWER EXTREMITY ANGIOGRAM Bilateral 01/08/2017   Procedure: Bilateral Lower Extremity Angiogram;  Surgeon: Maeola Harman, MD;  Location: [facility] OR;  Service: Vascular;  Laterality: Bilateral;   PATCH ANGIOPLASTY  01/08/2017   Procedure: Patch Angioplasty Left Popliteal Artery ;  Surgeon: Maeola Harman, MD;  Location: [facility] OR;  Service: Vascular;;   TRANSTHORACIC ECHOCARDIOGRAM  12/2016   Normal EF 60-65%.  Essentially normal echocardiogram.  No significant valvular lesions.   Family History  Problem Relation Age of Onset   Cancer Maternal Grandmother    Heart disease Maternal Grandfather        He died at 66. Unknown what heart disease   Social History   Socioeconomic History   Marital status: Married    Spouse name: Not on file   Number of children: Not on file   Years of education: Not on file   Highest education level: Not on file  Occupational History   Not on file  Tobacco Use   Smoking status: Former    Current packs/day: 0.00    Average packs/day: 0.5 packs/day for 40.0 years (20.0 ttl pk-yrs)    Types: Cigarettes    Start date: 12/21/1976    Quit date: 12/21/2016    Years since quitting: 7.0   Smokeless  tobacco: Never  Vaping Use   Vaping status: Never Used  Substance and Sexual Activity   Alcohol use: No   Drug use: No   Sexual activity: Not Currently  Other Topics Concern   Not on file  Social History Narrative   She is married. No kids. Currently disabled. No longer working. She still smokes at least a pack a day, but is hoping to quit with patches. Does not drink alcohol or use illicit drugs   Social Drivers of Corporate investment banker Strain: Low Risk  (  01/16/2024)   Overall Financial Resource Strain (CARDIA)    Difficulty of Paying Living Expenses: Not hard at all  Food Insecurity: No Food Insecurity (01/16/2024)   Hunger Vital Sign    Worried About Running Out of Food in the Last Year: Never true    Ran Out of Food in the Last Year: Never true  Transportation Needs: Unmet Transportation Needs (01/16/2024)   PRAPARE - Administrator, Civil Service (Medical): Yes    Lack of Transportation (Non-Medical): No  Physical Activity: Inactive (01/16/2024)   Exercise Vital Sign    Days of Exercise per Week: 0 days    Minutes of Exercise per Session: 0 min  Stress: Stress Concern Present (01/16/2024)   [employer] of Occupational Health - Occupational Stress Questionnaire    Feeling of Stress : To some extent  Social Connections: Moderately Integrated (01/16/2024)   Social Connection and Isolation Panel [NHANES]    Frequency of Communication with Friends and Family: More than three times a week    Frequency of Social Gatherings with Friends and Family: Once a week    Attends Religious Services: More than 4 times per year    Active Member of [employer] or Organizations: No    Attends Engineer, structural: Never    Marital Status: Married    Tobacco Counseling Counseling given: Not Answered   Clinical Intake:  Pre-visit preparation completed: No  Pain : No/denies pain     BMI - recorded: 15.91 Nutritional Status: BMI <19  Underweight Nutritional  Risks: None Diabetes: No  How often do you need to have someone help you when you read instructions, pamphlets, or other written materials from your doctor or pharmacy?: 2 - Rarely What is the last grade level you completed in school?: 12 th  Interpreter Needed?: No      Activities of Daily Living    01/16/2024    1:32 PM  In your present state of health, do you have any difficulty performing the following activities:  Hearing? 0  Vision? 1  Comment wears glasses  Difficulty concentrating or making decisions? 0  Walking or climbing stairs? 1  Dressing or bathing? 0  Doing errands, shopping? 0  Preparing Food and eating ? N  Using the Toilet? N  In the past six months, have you accidently leaked urine? N  Do you have problems with loss of bowel control? Y  Managing your Medications? N  Managing your Finances? N  Housekeeping or managing your Housekeeping? N    Patient Care Team: Windell Moment, MD as PCP - General (Family Medicine) Marykay Lex, MD as Consulting Physician (Cardiology)  Indicate any recent Medical Services you may have received from other than [facility] providers in the past year (date may be approximate).     Assessment:   This is a routine wellness examination for Alison Lynch.  Hearing/Vision screen No results found.   Goals Addressed   None   Depression Screen    01/16/2024    1:22 PM 10/11/2023    2:35 PM 02/18/2017   12:48 PM  PHQ 2/9 Scores  PHQ - 2 Score 5 5 0  PHQ- 9 Score 13 18 5     Fall Risk    01/16/2024    1:31 PM 01/16/2024    1:22 PM 10/11/2023    2:34 PM 02/18/2017   12:48 PM  Fall Risk   Falls in the past year? 0 0 1 No  Number falls in past yr:  0 0 1   Injury with Fall? 0 0 0   Risk for fall due to : No Fall Risks No Fall Risks Impaired mobility   Follow up   Falls evaluation completed     MEDICARE RISK AT HOME: Medicare Risk at Home Any stairs in or around the home?: No If so, are there any without handrails?: No Home  free of loose throw rugs in walkways, pet beds, electrical cords, etc?: No Adequate lighting in your home to reduce risk of falls?: Yes Life alert?: No Use of a cane, walker or w/c?: Yes (Wheelchair) Grab bars in the bathroom?: Yes Shower chair or bench in shower?: Yes Elevated toilet seat or a handicapped toilet?: No  TIMED UP AND GO:  Was the test performed?  No    Cognitive Function:        01/16/2024    1:36 PM 01/16/2024    1:33 PM  6CIT Screen  What Year? 0 points 0 points  What month? 0 points 0 points  What time? 0 points 0 points  Count back from 20 0 points 0 points  Months in reverse 4 points 0 points  Repeat phrase 0 points 10 points  Total Score 4 points 10 points    Immunizations Immunization History  Administered Date(s) Administered   Influenza, Mdck, Trivalent,PF 6+ MOS(egg free) 01/16/2024   Influenza, Quadrivalent, Recombinant, Inj, Pf 10/21/2020   Influenza-Unspecified 10/17/2019   Moderna Sars-Covid-2 Vaccination 06/11/2020, 07/09/2020   PNEUMOCOCCAL CONJUGATE-20 01/16/2024      Screening Tests Health Maintenance  Topic Date Due   FOOT EXAM  Never done   Hepatitis C Screening  Never done   DTaP/Tdap/Td (1 - Tdap) Never done   Zoster Vaccines- Shingrix (1 of 2) Never done   Cervical Cancer Screening (HPV/Pap Cotest)  Never done   Colonoscopy  Never done   Lung Cancer Screening  Never done   MAMMOGRAM  Never done   COVID-19 Vaccine (3 - Moderna risk series) 08/06/2020   OPHTHALMOLOGY EXAM  02/23/2023   HEMOGLOBIN A1C  04/09/2024   Diabetic kidney evaluation - eGFR measurement  10/10/2024   Diabetic kidney evaluation - Urine ACR  10/10/2024   Medicare Annual Wellness (AWV)  01/15/2025   Pneumococcal Vaccine 30-61 Years old  Completed   INFLUENZA VACCINE  Completed   HIV Screening  Completed   HPV VACCINES  Aged Out    Health Maintenance  Health Maintenance Due  Topic Date Due   FOOT EXAM  Never done   Hepatitis C Screening  Never  done   DTaP/Tdap/Td (1 - Tdap) Never done   Zoster Vaccines- Shingrix (1 of 2) Never done   Cervical Cancer Screening (HPV/Pap Cotest)  Never done   Colonoscopy  Never done   Lung Cancer Screening  Never done   MAMMOGRAM  Never done   COVID-19 Vaccine (3 - Moderna risk series) 08/06/2020   OPHTHALMOLOGY EXAM  02/23/2023     Vision Screening: Recommended annual ophthalmology exams for early detection of glaucoma and other disorders of the eye. Is the patient up to date with their annual eye exam?  Yes  Who is the provider or what is the name of the office in which the patient attends annual eye exams? [facility] If pt is not established with a provider, would they like to be referred to a provider to establish care? No .   Dental Screening: Recommended annual dental exams for proper oral hygiene  Diabetic Foot Exam: Diabetic  Foot Exam: Overdue, Pt has been advised about the importance in completing this exam. Pt is scheduled for diabetic foot exam on next visit.  Community Resource Referral / Chronic Care Management: CRR required this visit?  No   CCM required this visit?  No     Plan:     I have personally reviewed and noted the following in the patient's chart:   Medical and social history Use of alcohol, tobacco or illicit drugs  Current medications and supplements including opioid prescriptions. Patient is not currently taking opioid prescriptions. Functional ability and status Nutritional status Physical activity Advanced directives List of other physicians Hospitalizations, surgeries, and ER visits in previous 12 months Vitals Screenings to include cognitive, depression, and falls Referrals and appointments  In addition, I have reviewed and discussed with patient certain preventive protocols, quality metrics, and best practice recommendations. A written personalized care plan for preventive services as well as general preventive health recommendations were  provided to patient.     Windell Moment, MD   01/16/2024   After Visit Summary: (In Person-Printed) AVS printed and given to the patient  Nurse Notes:

## 2024-01-17 ENCOUNTER — Other Ambulatory Visit: Payer: Self-pay

## 2024-01-17 DIAGNOSIS — Z1231 Encounter for screening mammogram for malignant neoplasm of breast: Secondary | ICD-10-CM

## 2024-01-17 LAB — LIPID PANEL
Chol/HDL Ratio: 3.4 {ratio} (ref 0.0–4.4)
Cholesterol, Total: 203 mg/dL — ABNORMAL HIGH (ref 100–199)
HDL: 59 mg/dL (ref >39)
LDL Chol Calc (NIH): 104 mg/dL — ABNORMAL HIGH (ref 0–99)
Triglycerides: 235 mg/dL — ABNORMAL HIGH (ref 0–149)
VLDL Cholesterol Cal: 40 mg/dL (ref 5–40)

## 2024-01-17 LAB — COMPREHENSIVE METABOLIC PANEL
ALT: 23 [IU]/L (ref 0–32)
AST: 19 [IU]/L (ref 0–40)
Albumin: 4.3 g/dL (ref 3.8–4.9)
Alkaline Phosphatase: 100 [IU]/L (ref 44–121)
BUN/Creatinine Ratio: 12 (ref 12–28)
BUN: 17 mg/dL (ref 8–27)
Bilirubin Total: <0.2 mg/dL (ref 0.0–1.2)
CO2: 27 mmol/L (ref 20–29)
Calcium: 9.4 mg/dL (ref 8.7–10.3)
Chloride: 104 mmol/L (ref 96–106)
Creatinine, Ser: 1.42 mg/dL — ABNORMAL HIGH (ref 0.57–1.00)
Globulin, Total: 2.2 g/dL (ref 1.5–4.5)
Glucose: 78 mg/dL (ref 70–99)
Potassium: 5.1 mmol/L (ref 3.5–5.2)
Sodium: 144 mmol/L (ref 134–144)
Total Protein: 6.5 g/dL (ref 6.0–8.5)
eGFR: 42 mL/min/{1.73_m2} — ABNORMAL LOW (ref >59)

## 2024-01-17 LAB — CBC WITH DIFFERENTIAL/PLATELET
Basophils Absolute: 0.1 10*3/uL (ref 0.0–0.2)
Basos: 2 %
EOS (ABSOLUTE): 0.3 10*3/uL (ref 0.0–0.4)
Eos: 4 %
Hematocrit: 36 % (ref 34.0–46.6)
Hemoglobin: 11.7 g/dL (ref 11.1–15.9)
Immature Grans (Abs): 0 10*3/uL (ref 0.0–0.1)
Immature Granulocytes: 0 %
Lymphocytes Absolute: 2.5 10*3/uL (ref 0.7–3.1)
Lymphs: 31 %
MCH: 30.2 pg (ref 26.6–33.0)
MCHC: 32.5 g/dL (ref 31.5–35.7)
MCV: 93 fL (ref 79–97)
Monocytes Absolute: 0.8 10*3/uL (ref 0.1–0.9)
Monocytes: 10 %
Neutrophils Absolute: 4.3 10*3/uL (ref 1.4–7.0)
Neutrophils: 53 %
Platelets: 328 10*3/uL (ref 150–450)
RBC: 3.87 x10E6/uL (ref 3.77–5.28)
RDW: 14.3 % (ref 11.7–15.4)
WBC: 8.1 10*3/uL (ref 3.4–10.8)

## 2024-01-17 LAB — TSH: TSH: 3.17 u[IU]/mL (ref 0.450–4.500)

## 2024-01-17 LAB — HEMOGLOBIN A1C
Est. average glucose Bld gHb Est-mCnc: 120 mg/dL
Hgb A1c MFr Bld: 5.8 % — ABNORMAL HIGH (ref 4.8–5.6)

## 2024-01-26 ENCOUNTER — Other Ambulatory Visit: Payer: Self-pay

## 2024-01-26 ENCOUNTER — Telehealth: Payer: Self-pay

## 2024-01-26 NOTE — Telephone Encounter (Signed)
 Called patient after looking in patient chart looks like Paroxetine 40 mg is sent by [facility], called patient and recommend she call them for a refill of medication. Patient Made Aware, Verbalized Understanding.

## 2024-01-26 NOTE — Telephone Encounter (Signed)
 Copied from CRM [id number]. Topic: Clinical - Prescription Issue >> Jan 26, 2024  4:33 PM Albin Felling L wrote: Reason for CRM: Patient running out of medicine (PAROXETINE HCL 40 MG TABLET), patient only has about 6 pills left. Pharmacy informed patient that insurance will not cover Rx until 02/11/2024. Patient takes rx once daily in morning and once daily at night. Patient states she also does not have the money to pay for it out of pocket.   Patient requesting a callback, [phone]

## 2024-02-01 ENCOUNTER — Ambulatory Visit (INDEPENDENT_AMBULATORY_CARE_PROVIDER_SITE_OTHER)
Admission: RE | Admit: 2024-02-01 | Discharge: 2024-02-01 | Disposition: A | Discharge status: Home / Self Care | Payer: No Typology Code available for payment source | Source: Ambulatory Visit

## 2024-02-01 DIAGNOSIS — Z87891 Personal history of nicotine dependence: Secondary | ICD-10-CM

## 2024-02-07 ENCOUNTER — Other Ambulatory Visit: Payer: Self-pay

## 2024-02-17 ENCOUNTER — Ambulatory Visit (INDEPENDENT_AMBULATORY_CARE_PROVIDER_SITE_OTHER)

## 2024-02-17 VITALS — BP 124/64 | HR 90 | Temp 97.5°F | Ht 62.0 in

## 2024-02-17 DIAGNOSIS — Z89611 Acquired absence of right leg above knee: Secondary | ICD-10-CM

## 2024-02-17 DIAGNOSIS — H6123 Impacted cerumen, bilateral: Secondary | ICD-10-CM

## 2024-02-17 HISTORY — DX: Impacted cerumen, bilateral: H61.23

## 2024-02-17 NOTE — Assessment & Plan Note (Signed)
 Cerumen impaction Bilateral cerumen impaction causing hearing difficulties without associated pain. The cerumen is deeply embedded in the ear canal, with more accumulation in the right ear than the left. - Perform ear irrigation to remove cerumen from both ears. - Advised against using non-medical methods for cerumen softening. - advised to use DEBROX ear drops as needed

## 2024-02-17 NOTE — Progress Notes (Signed)
 Acute Office Visit  Subjective:    Patient ID: Alison Lynch, female    DOB: [birth year], 62 y.o.   MRN: [id number]  Chief Complaint  Patient presents with   Hearing Problem    Discussed the use of AI scribe software for clinical note transcription with the patient, who gave verbal consent to proceed.       HPI: Alison Lynch is a 63 year old female who presents with ear problems. She is accompanied by her mother.  She has been experiencing ear problems, noting an improvement in hearing today compared to four days ago, although she still struggles with hearing. There is no ear pain. She mentions wax buildup in her ears, which has been problematic.  She has gained weight recently, which she attributes to a reduction in her polypharmacy and is happy about the weight gain. Her mother states that previous medications may have been causing adverse reactions, and since reducing them, she has been doing better.    Past Medical History:  Diagnosis Date   Above knee amputation of left lower extremity (HCC) 01/13/2017   Acquired absence of left leg above knee (HCC) 01/13/2017   Acute renal failure syndrome (HCC) 06/18/2020   AKI (acute kidney injury) (HCC)    Anxiety 06/18/2020   Aortobifemoral bypass graft thrombosis, initial encounter (HCC) 04/19/2018   Aortoiliac occlusive disease (HCC) 05/30/2017   Arterial graft thrombosis (HCC) 04/19/2018   Arthrodesis status 05/18/2016   Atherosclerosis of aorta (HCC) 06/18/2020   Benign essential hypertension 06/18/2020   Formatting of this note might be different from the original. Last Assessment & Plan:  Formatting of this note might be different from the original. Clearly not hypertensive.  Not on any medications.   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 Last Assessment & Plan:  Formatting of this note might be different from the original. Followed by Dr.  Earlyne Iba surgeries.  Now doing well.  Trying to get up and going with her prosthesis.  we need to be following her lipids. Smoking cessation counseling.   Former heavy tobacco smoker 07/03/2017   GERD (gastroesophageal reflux disease) 11/16/2018   Hx of migraine headaches    Hyperlipidemia    Hyperlipidemia LDL goal <70 05/12/2017   Hypertension 11/16/2018   Hypothyroid    Hypothyroidism 11/16/2018   Ischemia of left lower extremity 01/08/2017   Formatting of this note might be different from the original. Last Assessment & Plan:  Formatting of this note might be different from the original. Status post left AKA, now with left lower extremity ischemia. Pending aortobifem bypass. The urgent procedure.   Ischemia of lower extremity 01/08/2017   Leriche syndrome (HCC) 12/2016   Chronically occluded aorta   Leukocytosis 06/18/2020   Osteoporosis    PAD (peripheral artery disease) (HCC)    Status post left AKA for extensive thromboembolism, right leg has in-line flow through popliteal artery only.  Bilateral iliac stents occluded   Panic attacks    Peripheral artery disease (HCC) 05/12/2017   Peripheral vascular disease (HCC)  05/12/2017   Formatting of this note might be different from the original. Last Assessment & Plan:  Formatting of this note might be different from the original. At this point I want to let her recover from her surgery and her amputation.  She is asked to follow-up in the [place] point office.  My plan was for her to be seen in in 1year once our [place] office reopens.  She would need to be reassigned to a n   Personal history of  other diseases of the nervous system and sense organs 06/16/2021   Phantom limb syndrome with pain (HCC) 06/18/2020   Pre-operative cardiovascular examination 05/12/2017   PTSD (post-traumatic stress disorder)    Pure hypertriglyceridemia 06/18/2020   S/P cervical spinal fusion 05/18/2016   Shortness of breath 11/16/2018   Overview:  exertional   Formatting of this note might be different from the original. exertional Formatting of this note might be different from the original. Formatting of this note might be different from the original. exertional Formatting of this note might be different from the original. Overview:  exertional   Stage 3 chronic kidney disease (HCC) 06/23/2020   Thrombocytopenia (HCC) 06/18/2020   Type 2 diabetes mellitus with other specified complication (HCC) 11/16/2018   Vitamin D deficiency, unspecified 06/23/2020   Wound infection 05/20/2018    Past Surgical History:  Procedure Laterality Date   ABDOMINAL AORTIC ANEURYSM REPAIR N/A 04/19/2018   Procedure: AORTIC EMBOLECTOMY AORTOGRAM;  Surgeon: Larina Earthly, MD;  Location[facility] OR;  Service: Vascular;  Laterality: N/A;   ABDOMINAL AORTOGRAM N/A 04/19/2018   Procedure: ABDOMINAL AORTOGRAM;  Surgeon: Larina Earthly, MD;  Location: [facility] OR;  Service: Vascular;  Laterality: N/A;   ABDOMINAL AORTOGRAM W/LOWER EXTREMITY Right 04/27/2017   Procedure: Abdominal Aortogram w/Lower Extremity;  Surgeon: Maeola Harman, MD;  Location: [facility] INVASIVE CV LAB;  Service: Cardiovascular;  Laterality: Right: Bilateral iliac stents occluded. Normal aortic flow with patent hypogastric and renal arteries. Femoral arteries patent by ultrasound; right leg and showed below-knee runoff -> plan Aortobifem Bypass   AMPUTATION Left 01/09/2017   Procedure: AMPUTATION ABOVE KNEE;  Surgeon: Maeola Harman, MD;  Location: [facility] OR;  Service: Vascular;  Laterality: Left;   AORTA - BILATERAL FEMORAL ARTERY BYPASS GRAFT N/A 05/30/2017    Procedure: AORTA BIFEMORAL BYPASS GRAFT USING [id number] HEMASHIELD GOLD GRAFT;  Surgeon: Maeola Harman, MD;  Location: [facility] OR;  Service: Vascular;  Laterality: N/A;   AORTOGRAM Left 01/08/2017   Procedure: Ultrasound Guided Cannulation Left Common Femoral Artery; Aortagram;  Surgeon: Maeola Harman, MD;  Location: [facility] OR;  Service: Vascular;  Laterality: Left;   ARTERY EXPLORATION Left 01/08/2017   Procedure: Left Common Femoral Artery Exploration;  Surgeon: Maeola Harman, MD;  Location: [facility] OR;  Service: Vascular;  Laterality: Left;   BACK SURGERY     EMBOLECTOMY Left 01/08/2017   Procedure: Left Lower Extremity Thromboembolectomy;  Surgeon: Maeola Harman, MD;  Location: [facility] OR;  Service: Vascular;  Laterality: Left;   ENDARTERECTOMY POPLITEAL Left 01/08/2017   Procedure: Left Popliteal Endarterectomy;  Surgeon: Maeola Harman, MD;  Location: [facility] OR;  Service: Vascular;  Laterality: Left;   FEMORAL ARTERY EXPLORATION Bilateral 05/20/2018   Procedure: FEMORAL EXPLORATION, RIGHT;  Surgeon: Larina Earthly, MD;  Location: [facility] OR;  Service: Vascular;  Laterality: Bilateral;   INSERTION OF ILIAC STENT Bilateral 01/08/2017   Procedure: Bilateral Common Iliac Stent and Left Popliteal Artery Stent;  Surgeon: Maeola Harman,  MD;  Location: [facility] OR;  Service: Vascular;  Laterality: Bilateral;   LOWER EXTREMITY ANGIOGRAM Bilateral 01/08/2017   Procedure: Bilateral Lower Extremity Angiogram;  Surgeon: Maeola Harman, MD;  Location: [facility] OR;  Service: Vascular;  Laterality: Bilateral;   PATCH ANGIOPLASTY  01/08/2017   Procedure: Patch Angioplasty Left Popliteal Artery ;  Surgeon: Maeola Harman, MD;  Location: [facility] OR;  Service: Vascular;;   TRANSTHORACIC ECHOCARDIOGRAM  12/2016   Normal EF 60-65%.  Essentially normal echocardiogram.  No significant valvular lesions.    Family History  Problem Relation Age of Onset   Cancer Maternal  Grandmother    Heart disease Maternal Grandfather        He died at 52. Unknown what heart disease    Social History   Socioeconomic History   Marital status: Married    Spouse name: Not on file   Number of children: Not on file   Years of education: Not on file   Highest education level: Not on file  Occupational History   Not on file  Tobacco Use   Smoking status: Former    Current packs/day: 0.00    Average packs/day: 0.5 packs/day for 40.0 years (20.0 ttl pk-yrs)    Types: Cigarettes    Start date: 12/21/1976    Quit date: 12/21/2016    Years since quitting: 7.1   Smokeless tobacco: Never  Vaping Use   Vaping status: Never Used  Substance and Sexual Activity   Alcohol use: No   Drug use: No   Sexual activity: Not Currently  Other Topics Concern   Not on file  Social History Narrative   She is married. No kids. Currently disabled. No longer working. She still smokes at least a pack a day, but is hoping to quit with patches. Does not drink alcohol or use illicit drugs   Social Drivers of Corporate investment banker Strain: Low Risk  (01/16/2024)   Overall Financial Resource Strain (CARDIA)    Difficulty of Paying Living Expenses: Not hard at all  Food Insecurity: No Food Insecurity (01/16/2024)   Hunger Vital Sign    Worried About Running Out of Food in the Last Year: Never true    Ran Out of Food in the Last Year: Never true  Transportation Needs: Unmet Transportation Needs (01/16/2024)   PRAPARE - Administrator, Civil Service (Medical): Yes    Lack of Transportation (Non-Medical): No  Physical Activity: Inactive (01/16/2024)   Exercise Vital Sign    Days of Exercise per Week: 0 days    Minutes of Exercise per Session: 0 min  Stress: Stress Concern Present (01/16/2024)   [employer] of Occupational Health - Occupational Stress Questionnaire    Feeling of Stress : To some extent  Social Connections: Moderately Integrated (01/16/2024)   Social  Connection and Isolation Panel [NHANES]    Frequency of Communication with Friends and Family: More than three times a week    Frequency of Social Gatherings with Friends and Family: Once a week    Attends Religious Services: More than 4 times per year    Active Member of [employer] or Organizations: No    Attends Banker Meetings: Never    Marital Status: Married  Catering manager Violence: Not At Risk (01/16/2024)   Humiliation, Afraid, Rape, and Kick questionnaire    Fear of Current or Ex-Partner: No    Emotionally Abused: No    Physically Abused: No    Sexually Abused:  No    Outpatient Medications Prior to Visit  Medication Sig Dispense Refill   aspirin EC 81 MG tablet Take 81 mg by mouth daily.     atorvastatin (LIPITOR) 80 MG tablet TAKE ONE TABLET BY MOUTH EVERY DAY 90 tablet 3   gabapentin (NEURONTIN) 400 MG capsule Take 1 capsule (400 mg total) by mouth 3 (three) times daily. 90 capsule 1   levothyroxine (SYNTHROID) 50 MCG tablet Take 50 mcg by mouth daily before breakfast.     mirtazapine (REMERON SOL-TAB) 45 MG disintegrating tablet Take 22.5 mg by mouth at bedtime.     nitroGLYCERIN (NITROSTAT) 0.4 MG SL tablet Place 1 tablet (0.4 mg total) under the tongue every 5 (five) minutes as needed for chest pain. 25 tablet 11   omeprazole (PRILOSEC) 40 MG capsule Take 1 capsule (40 mg total) by mouth daily. 90 capsule 1   ondansetron (ZOFRAN) 8 MG tablet Take 1 tablet (8 mg total) by mouth every 8 (eight) hours as needed for nausea or vomiting. 20 tablet 0   PARoxetine (PAXIL) 40 MG tablet Take 40 mg by mouth 2 (two) times daily.     PROLIA 60 MG/ML SOSY injection Inject 60 mg into the skin every 6 (six) months.     promethazine (PHENERGAN) 12.5 MG tablet Take 1 tablet (12.5 mg total) by mouth every 6 (six) hours as needed for nausea or vomiting. 30 tablet 2   QUEtiapine (SEROQUEL) 200 MG tablet Take by mouth.     rivaroxaban (XARELTO) 2.5 MG TABS tablet Take 2.5 mg by mouth  daily.     No facility-administered medications prior to visit.    Allergies  Allergen Reactions   Clindamycin Rash   Clindamycin/Lincomycin Rash    Review of Systems  Constitutional:  Negative for chills, fatigue and fever.  HENT:  Positive for hearing loss. Negative for congestion, ear pain and sinus pain.   Respiratory:  Negative for cough and shortness of breath.   Cardiovascular:  Negative for chest pain.  Gastrointestinal:  Negative for abdominal pain, constipation, diarrhea, nausea and vomiting.  Musculoskeletal:  Negative for myalgias.  Neurological:  Negative for headaches.       Objective:        02/17/2024   10:38 AM 01/16/2024    2:10 PM 01/16/2024    1:16 PM  Vitals with BMI  Height 5\' 2"   5\' 2"   Weight   87 lbs  BMI   15.91  Systolic 124 150 161  Diastolic 64 60 60  Pulse 90  97    No data found.   Physical Exam Vitals reviewed.  HENT:     Head: Normocephalic and atraumatic.     Ears:     Comments: Soft cerumen noted in both ears, which is cleared with manual curette and ear lavage Cardiovascular:     Rate and Rhythm: Normal rate and regular rhythm.  Pulmonary:     Effort: Pulmonary effort is normal.     Breath sounds: Normal breath sounds.  Neurological:     Mental Status: She is alert.     Health Maintenance Due  Topic Date Due   FOOT EXAM  Never done   Hepatitis C Screening  Never done   DTaP/Tdap/Td (1 - Tdap) Never done   Zoster Vaccines- Shingrix (1 of 2) Never done   Cervical Cancer Screening (HPV/Pap Cotest)  Never done   Colonoscopy  Never done   MAMMOGRAM  Never done   COVID-19 Vaccine (3 -  Moderna risk series) 08/06/2020   OPHTHALMOLOGY EXAM  02/23/2023    There are no preventive care reminders to display for this patient.   Lab Results  Component Value Date   TSH 3.170 01/16/2024   Lab Results  Component Value Date   WBC 8.1 01/16/2024   HGB 11.7 01/16/2024   HCT 36.0 01/16/2024   MCV 93 01/16/2024   PLT 328  01/16/2024   Lab Results  Component Value Date   NA 144 01/16/2024   K 5.1 01/16/2024   CO2 27 01/16/2024   GLUCOSE 78 01/16/2024   BUN 17 01/16/2024   CREATININE 1.42 (H) 01/16/2024   BILITOT <0.2 01/16/2024   ALKPHOS 100 01/16/2024   AST 19 01/16/2024   ALT 23 01/16/2024   PROT 6.5 01/16/2024   ALBUMIN 4.3 01/16/2024   CALCIUM 9.4 01/16/2024   ANIONGAP 11 05/20/2018   EGFR 42 (L) 01/16/2024   Lab Results  Component Value Date   CHOL 203 (H) 01/16/2024   Lab Results  Component Value Date   HDL 59 01/16/2024   Lab Results  Component Value Date   LDLCALC 104 (H) 01/16/2024   Lab Results  Component Value Date   TRIG 235 (H) 01/16/2024   Lab Results  Component Value Date   CHOLHDL 3.4 01/16/2024   Lab Results  Component Value Date   HGBA1C 5.8 (H) 01/16/2024       Assessment & Plan:  Bilateral hearing loss due to cerumen impaction Assessment & Plan: Cerumen impaction Bilateral cerumen impaction causing hearing difficulties without associated pain. The cerumen is deeply embedded in the ear canal, with more accumulation in the right ear than the left. - Perform ear irrigation to remove cerumen from both ears. - Advised against using non-medical methods for cerumen softening. - advised to use DEBROX ear drops as needed      Assessment and Plan       No orders of the defined types were placed in this encounter.   No orders of the defined types were placed in this encounter.    Follow-up: No follow-ups on file.  An After Visit Summary was printed and given to the patient.  Windell Moment, MD [facility] [phone]

## 2024-02-17 NOTE — Patient Instructions (Signed)
 Ear lavage done today Please use DEBROX EAR DROPS every 2-3 weeks for ear wax Return as per scheduled appt.

## 2024-02-22 ENCOUNTER — Other Ambulatory Visit: Payer: Self-pay

## 2024-02-27 ENCOUNTER — Other Ambulatory Visit: Payer: Self-pay

## 2024-02-27 DIAGNOSIS — K219 Gastro-esophageal reflux disease without esophagitis: Secondary | ICD-10-CM

## 2024-03-06 ENCOUNTER — Other Ambulatory Visit: Payer: Self-pay

## 2024-03-08 DIAGNOSIS — R1310 Dysphagia, unspecified: Secondary | ICD-10-CM | POA: Diagnosis not present

## 2024-03-08 DIAGNOSIS — K219 Gastro-esophageal reflux disease without esophagitis: Secondary | ICD-10-CM | POA: Diagnosis not present

## 2024-03-08 DIAGNOSIS — K5900 Constipation, unspecified: Secondary | ICD-10-CM | POA: Diagnosis not present

## 2024-04-05 ENCOUNTER — Other Ambulatory Visit: Payer: Self-pay

## 2024-04-21 ENCOUNTER — Other Ambulatory Visit: Payer: Self-pay

## 2024-05-14 ENCOUNTER — Other Ambulatory Visit: Payer: Self-pay

## 2024-05-14 DIAGNOSIS — K219 Gastro-esophageal reflux disease without esophagitis: Secondary | ICD-10-CM

## 2024-05-14 MED ORDER — OMEPRAZOLE 40 MG PO CPDR
40.0000 mg | DELAYED_RELEASE_CAPSULE | Freq: Every day | ORAL | 1 refills | Status: DC
Start: 2024-05-14 — End: 2024-07-22

## 2024-05-15 ENCOUNTER — Ambulatory Visit (INDEPENDENT_AMBULATORY_CARE_PROVIDER_SITE_OTHER): Payer: No Typology Code available for payment source

## 2024-05-15 VITALS — BP 102/64 | HR 97 | Temp 97.7°F | Ht 62.0 in | Wt 96.4 lb

## 2024-05-15 DIAGNOSIS — Z01419 Encounter for gynecological examination (general) (routine) without abnormal findings: Secondary | ICD-10-CM

## 2024-05-15 DIAGNOSIS — I10 Essential (primary) hypertension: Secondary | ICD-10-CM | POA: Diagnosis not present

## 2024-05-15 DIAGNOSIS — I7409 Other arterial embolism and thrombosis of abdominal aorta: Secondary | ICD-10-CM | POA: Diagnosis not present

## 2024-05-15 DIAGNOSIS — Z89612 Acquired absence of left leg above knee: Secondary | ICD-10-CM | POA: Diagnosis not present

## 2024-05-15 DIAGNOSIS — Z1159 Encounter for screening for other viral diseases: Secondary | ICD-10-CM

## 2024-05-15 DIAGNOSIS — Z0000 Encounter for general adult medical examination without abnormal findings: Secondary | ICD-10-CM | POA: Insufficient documentation

## 2024-05-15 DIAGNOSIS — G959 Disease of spinal cord, unspecified: Secondary | ICD-10-CM

## 2024-05-15 DIAGNOSIS — J42 Unspecified chronic bronchitis: Secondary | ICD-10-CM

## 2024-05-15 DIAGNOSIS — N1831 Chronic kidney disease, stage 3a: Secondary | ICD-10-CM

## 2024-05-15 DIAGNOSIS — Z1211 Encounter for screening for malignant neoplasm of colon: Secondary | ICD-10-CM | POA: Diagnosis not present

## 2024-05-15 DIAGNOSIS — E039 Hypothyroidism, unspecified: Secondary | ICD-10-CM

## 2024-05-15 DIAGNOSIS — H9193 Unspecified hearing loss, bilateral: Secondary | ICD-10-CM

## 2024-05-15 DIAGNOSIS — Z1231 Encounter for screening mammogram for malignant neoplasm of breast: Secondary | ICD-10-CM

## 2024-05-15 DIAGNOSIS — E118 Type 2 diabetes mellitus with unspecified complications: Secondary | ICD-10-CM

## 2024-05-15 DIAGNOSIS — E1169 Type 2 diabetes mellitus with other specified complication: Secondary | ICD-10-CM | POA: Diagnosis not present

## 2024-05-15 HISTORY — DX: Unspecified chronic bronchitis: J42

## 2024-05-15 HISTORY — DX: Encounter for general adult medical examination without abnormal findings: Z00.00

## 2024-05-15 HISTORY — DX: Unspecified hearing loss, bilateral: H91.93

## 2024-05-15 HISTORY — DX: Encounter for gynecological examination (general) (routine) without abnormal findings: Z01.419

## 2024-05-15 HISTORY — DX: Encounter for screening mammogram for malignant neoplasm of breast: Z12.31

## 2024-05-15 NOTE — Progress Notes (Addendum)
 Subjective:  Patient ID: Alison Lynch, female    DOB: 01-24-1961  Age: 63 y.o. MRN: [id number]  Chief Complaint  Patient presents with   Annual Exam   PATIENT IS HERE FOR A WELL WOMAN EXAM WITH GYNECOLOGICAL EXAM, AND FOLLOW UP OF MULTIPLE CHRONIC MEDICAL PROBLEMS.   Well Adult Physical: Patient here for a comprehensive physical exam.The patient reports problems - hearing decline Do you take any herbs or supplements that were not prescribed by a doctor? no Are you taking calcium  supplements? no Are you taking aspirin  daily? yes  Encounter for general adult medical examination without abnormal findings  Physical (At Risk items are starred): Patient's last physical exam was 1 year ago .  Patient is not afflicted from Stress Incontinence and Urge Incontinence  Patient wears a seat belts Patient has smoke detectors and has carbon monoxide detectors. Patient practices appropriate gun safety. Patient wears sunscreen with extended sun exposure. Dental Care: biannual cleanings, brushes and flosses daily. Ophthalmology/Optometry: Annual visit.  Hearing loss: none Vision impairments: none  Menarche: 15 Menstrual History: menopausal in her 30s LMP: in her 30s Pregnancy history: never Safe at home: yes Self breast exams: no  Discussed the use of AI scribe software for clinical note transcription with the patient, who gave verbal consent to proceed.  History of Present Illness   Alison Lynch is a 63 year old female who presents for medication review and ear wax removal. She is accompanied by her husband, Signe.  She is currently on Xarelto 2.5 mg twice daily for a history of iliac artery embolism and thrombosis. Her medication regimen also includes omeprazole  40 mg once daily for heartburn, Paxil 20 mg twice daily for PTSD, gabapentin  400 mg three times daily for chronic pain, atorvastatin  80 mg once daily for high cholesterol, and buspirone  30 mg three times daily for anxiety. She takes  Zofran  8 mg as needed for nausea and a daily aspirin . She mentions breaking some pills in half to aid swallowing.  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 She quit smoking in 2018 and does not consume alcohol. She lives in [place] and is assisted by her mother and husband with phone and other needs.  She reports hearing difficulties and wax buildup in both ears, with more significant issues in the right ear. She mentions a past experience with sweet oil for ear wax removal. No current pain is reported, and she feels better after reducing her medication load.  She has a history of early menopause, having stopped menstruating in her thirties, and has never been pregnant. She wants to have had children but acknowledges she is now too old.  She has not had a mammogram in a long time and is unsure about the last Pap smear, which she believes was over five years ago. She has had a colonoscopy in the past but is unsure about its benefits and prefers not to repeat it. She has lost some weight recently and is not taking any calcium  supplements.          05/15/2024    2:23 PM 01/16/2024    1:22 PM 10/11/2023    2:35 PM 02/18/2017   12:48 PM  Depression screen PHQ 2/9  Decreased Interest 1 2 2  0  Down, Depressed, Hopeless 2 3 3  0  PHQ - 2 Score 3 5 5  0  Altered sleeping 2 1 3 3   Tired,  decreased energy 1 1 2 1   Change in appetite 0 2 2 0  Feeling bad or failure about yourself  1 2 2 1   Trouble concentrating 0 0 1 0  Moving slowly or fidgety/restless 0 0 0 0  Suicidal thoughts 1 2 3  0   PHQ-9 Score 8 13 18 5   Difficult doing work/chores Somewhat difficult Not difficult at all Somewhat difficult Not difficult at all     Data saved with a previous flowsheet row definition         10/11/2023    2:34 PM 01/16/2024    1:22 PM 01/16/2024    1:31 PM 02/17/2024   10:40 AM 05/15/2024    2:23 PM  Fall Risk  Falls in the past  year? 1 0 0 1 0  Was there an injury with Fall? 0 0 0 0 0  Fall Risk Category Calculator 2 0 0 1 0  Patient at Risk for Falls Due to Impaired mobility No Fall Risks No Fall Risks No Fall Risks No Fall Risks  Fall risk Follow up Falls evaluation completed                 Social Hx   Social History   Socioeconomic History   Marital status: Married    Spouse name: Not on file   Number of children: Not on file   Years of education: Not on file   Highest education level: Not on file  Occupational History   Not on file  Tobacco Use   Smoking status: Former    Current packs/day: 0.00    Average packs/day: 0.5 packs/day for 40.0 years (20.0 ttl pk-yrs)    Types: Cigarettes    Start date: 12/21/1976    Quit date: 12/21/2016    Years since quitting: 7.4   Smokeless tobacco: Never  Vaping Use   Vaping status: Never Used  Substance and Sexual Activity   Alcohol use: No   Drug use: No   Sexual activity: Not Currently  Other Topics Concern   Not on file  Social History Narrative   She is married. No kids. Currently disabled. No longer working. She still smokes at least a pack a day, but is hoping to quit with patches. Does not drink alcohol or use illicit drugs   Social Drivers of Corporate investment banker Strain: Low Risk  (01/16/2024)   Overall Financial Resource Strain (CARDIA)    Difficulty of Paying Living Expenses: Not hard at all  Food Insecurity: No Food Insecurity (01/16/2024)   Hunger Vital Sign    Worried About Running Out of Food in the Last Year: Never true    Ran Out of Food in the Last Year: Never true  Transportation Needs: Unmet Transportation Needs (01/16/2024)   PRAPARE - Administrator, Civil Service (Medical): Yes    Lack of Transportation (Non-Medical): No  Physical Activity: Inactive (01/16/2024)   Exercise Vital Sign    Days of Exercise per Week: 0 days    Minutes of Exercise per Session: 0 min  Stress: Stress Concern Present (01/16/2024)    [employer] of Occupational Health - Occupational Stress Questionnaire    Feeling of Stress : To some extent  Social Connections: Moderately Integrated (01/16/2024)   Social Connection and Isolation Panel    Frequency of Communication with Friends and Family: More than three times a week    Frequency of Social Gatherings with Friends and Family: Once a week  Attends Religious Services: More than 4 times per year    Active Member of Clubs or Organizations: No    Attends Banker Meetings: Never    Marital Status: Married   Past Medical History:  Diagnosis Date   Above knee amputation of left lower extremity (HCC) 01/13/2017   Acquired absence of left leg above knee (HCC) 01/13/2017   Acute renal failure syndrome (HCC) 06/18/2020   AKI (acute kidney injury) (HCC)    Anxiety 06/18/2020   Aortobifemoral bypass graft thrombosis, initial encounter (HCC) 04/19/2018   Aortoiliac occlusive disease (HCC) 05/30/2017   Arterial graft thrombosis (HCC) 04/19/2018   Arthrodesis status 05/18/2016   Atherosclerosis of aorta (HCC) 06/18/2020   Benign essential hypertension 06/18/2020   Formatting of this note might be different from the original. Last Assessment & Plan:  Formatting of this note might be different from the original. Clearly not hypertensive.  Not on any medications.   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 Last Assessment & Plan:  Formatting of this note might be different from the original. Followed by Dr.  Leonides surgeries.  Now doing well.  Trying to  get up and going with her prosthesis.  we need to be following her lipids. Smoking cessation counseling.   Former heavy tobacco smoker 07/03/2017   GERD (gastroesophageal reflux disease) 11/16/2018   Hx of migraine headaches    Hyperlipidemia    Hyperlipidemia LDL goal <70 05/12/2017   Hypertension 11/16/2018   Hypothyroid    Hypothyroidism 11/16/2018   Ischemia of left lower extremity 01/08/2017   Formatting of this note might be different from the original. Last Assessment & Plan:  Formatting of this note might be different from the original. Status post left AKA, now with left lower extremity ischemia. Pending aortobifem bypass. The urgent procedure.   Ischemia of lower extremity 01/08/2017   Leriche syndrome (HCC) 12/2016   Chronically occluded aorta   Leukocytosis 06/18/2020   Osteoporosis    PAD (peripheral artery disease) (HCC)    Status post left AKA for extensive thromboembolism, right leg has in-line flow through popliteal artery only.  Bilateral iliac stents occluded   Panic attacks    Peripheral artery disease (HCC) 05/12/2017   Peripheral vascular disease (HCC) 05/12/2017   Formatting of this note might be different from the original. Last Assessment & Plan:  Formatting of this note might be different from the original. At this point I want to let her recover from her surgery and her amputation.  She is asked to follow-up in the [place] point office.  My plan was for her to be seen in in 1year once our [place] office reopens.  She would need to be reassigned to a n   Personal history of other diseases of the nervous system and sense organs 06/16/2021   Phantom limb syndrome with pain (HCC) 06/18/2020   Pre-operative cardiovascular examination 05/12/2017   PTSD (post-traumatic stress disorder)    Pure hypertriglyceridemia 06/18/2020   S/P cervical spinal fusion 05/18/2016   Shortness of breath  11/16/2018   Overview:  exertional   Formatting of this note might be different  from the original. exertional Formatting of this note might be different from the original. Formatting of this note might be different from the original. exertional Formatting of this note might be different from the original. Overview:  exertional   Stage 3 chronic kidney disease (HCC) 06/23/2020   Thrombocytopenia (HCC) 06/18/2020   Type 2 diabetes mellitus with other specified complication (HCC) 11/16/2018   Vitamin D deficiency, unspecified 06/23/2020   Wound infection 05/20/2018   Past Surgical History:  Procedure Laterality Date   ABDOMINAL AORTIC ANEURYSM REPAIR N/A 04/19/2018   Procedure: AORTIC EMBOLECTOMY AORTOGRAM;  Surgeon: Oris Krystal FALCON, MD;  Location: [facility] OR;  Service: Vascular;  Laterality: N/A;   ABDOMINAL AORTOGRAM N/A 04/19/2018   Procedure: ABDOMINAL AORTOGRAM;  Surgeon: Oris Krystal FALCON, MD;  Location: [facility] OR;  Service: Vascular;  Laterality: N/A;   ABDOMINAL AORTOGRAM W/LOWER EXTREMITY Right 04/27/2017   Procedure: Abdominal Aortogram w/Lower Extremity;  Surgeon: Sheree Penne Bruckner, MD;  Location: [facility] INVASIVE CV LAB;  Service: Cardiovascular;  Laterality: Right: Bilateral iliac stents occluded. Normal aortic flow with patent hypogastric and renal arteries. Femoral arteries patent by ultrasound; right leg and showed below-knee runoff -> plan Aortobifem Bypass   AMPUTATION Left 01/09/2017   Procedure: AMPUTATION ABOVE KNEE;  Surgeon: Penne Bruckner Sheree, MD;  Location: [facility] OR;  Service: Vascular;  Laterality: Left;   AORTA - BILATERAL FEMORAL ARTERY BYPASS GRAFT N/A 05/30/2017   Procedure: AORTA BIFEMORAL BYPASS GRAFT USING [id number] HEMASHIELD GOLD GRAFT;  Surgeon: Sheree Penne Bruckner, MD;  Location: [facility] OR;  Service: Vascular;  Laterality: N/A;   AORTOGRAM Left 01/08/2017   Procedure: Ultrasound Guided Cannulation Left Common Femoral Artery; Aortagram;  Surgeon: Penne Bruckner Sheree, MD;  Location: [facility] OR;  Service: Vascular;  Laterality: Left;   ARTERY EXPLORATION  Left 01/08/2017   Procedure: Left Common Femoral Artery Exploration;  Surgeon: Penne Bruckner Sheree, MD;  Location: [facility] OR;  Service: Vascular;  Laterality: Left;   BACK SURGERY     EMBOLECTOMY Left 01/08/2017   Procedure: Left Lower Extremity Thromboembolectomy;  Surgeon: Penne Bruckner Sheree, MD;  Location: [facility] OR;  Service: Vascular;  Laterality: Left;   ENDARTERECTOMY POPLITEAL Left 01/08/2017   Procedure: Left Popliteal Endarterectomy;  Surgeon: Penne Bruckner Sheree, MD;  Location: [facility] OR;  Service: Vascular;  Laterality: Left;   FEMORAL ARTERY EXPLORATION Bilateral 05/20/2018   Procedure: FEMORAL EXPLORATION, RIGHT;  Surgeon: Oris Krystal FALCON, MD;  Location: [facility] OR;  Service: Vascular;  Laterality: Bilateral;   INSERTION OF ILIAC STENT Bilateral 01/08/2017   Procedure: Bilateral Common Iliac Stent and Left Popliteal Artery Stent;  Surgeon: Penne Bruckner Sheree, MD;  Location: [facility] OR;  Service: Vascular;  Laterality: Bilateral;   LOWER EXTREMITY ANGIOGRAM Bilateral 01/08/2017   Procedure: Bilateral Lower Extremity Angiogram;  Surgeon: Penne Bruckner Sheree, MD;  Location: [facility] OR;  Service: Vascular;  Laterality: Bilateral;   PATCH ANGIOPLASTY  01/08/2017   Procedure: Patch Angioplasty Left Popliteal Artery ;  Surgeon: Penne Bruckner Sheree, MD;  Location: [facility] OR;  Service: Vascular;;   TRANSTHORACIC ECHOCARDIOGRAM  12/2016   Normal EF 60-65%.  Essentially normal echocardiogram.  No significant valvular lesions.    Family History  Problem Relation Age of Onset   Cancer Maternal Grandmother    Heart disease Maternal Grandfather        He died at 58. Unknown what heart disease    Review of Systems  Constitutional:  Negative for chills, fatigue and fever.  HENT:  Negative for congestion, ear pain and sore throat.   Respiratory:  Negative for cough and shortness of breath.   Cardiovascular:  Negative for chest pain.  Gastrointestinal:  Negative for abdominal pain, constipation,  diarrhea, nausea and vomiting.  Genitourinary:  Negative for dysuria and frequency.  Musculoskeletal:  Negative for arthralgias and myalgias.       Chronic pain  Neurological:  Negative for dizziness and headaches.  Psychiatric/Behavioral:  Negative for dysphoric mood. The patient is not nervous/anxious.      Objective:  BP 102/64   Pulse 97   Temp 97.7 F (36.5 C)   Ht 5' 2 (1.575 m)   Wt 96 lb 6.4 oz (43.7 kg)   SpO2 99%   BMI 17.63 kg/m      05/15/2024    2:18 PM 02/17/2024   10:38 AM 01/16/2024    2:10 PM  BP/Weight  Systolic BP 102 124 150  Diastolic BP 64 64 60  Wt. (Lbs) 96.4    BMI 17.63 kg/m2      Physical Exam Vitals and nursing note reviewed.  Constitutional:      Comments: Thin built female  HENT:     Head: Normocephalic and atraumatic.     Mouth/Throat:     Comments: Absent teeth on upper jaw   Eyes:     Pupils: Pupils are equal, round, and reactive to light.    Cardiovascular:     Rate and Rhythm: Normal rate and regular rhythm.  Pulmonary:     Effort: Pulmonary effort is normal.     Breath sounds: Normal breath sounds.  Genitourinary:    Comments: Pap smear performed  Musculoskeletal:     Cervical back: Normal range of motion.     Comments: S/p ABOVE KNEE AMPUTATION ON LEFT   Skin:    General: Skin is warm.   Neurological:     General: No focal deficit present.   Psychiatric:        Mood and Affect: Mood normal.     Lab Results  Component Value Date   WBC 6.9 05/15/2024   HGB 13.3 05/15/2024   HCT 41.1 05/15/2024   PLT 348 05/15/2024   GLUCOSE 100 (H) 05/15/2024   CHOL 247 (H) 05/15/2024   TRIG 338 (H) 05/15/2024   HDL 47 05/15/2024   LDLCALC 139 (H) 05/15/2024   ALT 17 05/15/2024   AST 15 05/15/2024   NA 144 05/15/2024   K 4.6 05/15/2024   CL 106 05/15/2024   CREATININE 1.65 (H) 05/15/2024   BUN 21 05/15/2024   CO2 22 05/15/2024   TSH 5.320 (H) 05/15/2024   INR 1.41 05/22/2018   HGBA1C 5.9 (H) 05/15/2024       Assessment & Plan:   Well woman exam with routine gynecological exam Assessment & Plan: General Health Maintenance Due for a mammogram and Pap smear. Declined repeat colonoscopy; agreed to Cologuard stool test as an alternative. - Order mammogram. - Order Cologuard stool test. - Perform Pap smear.in office today.  -uptodate with vaccines.   Orders: -     IGP, Aptima HPV, rfx 16/18,45  Acquired hypothyroidism -     TSH  Stage 3a chronic kidney disease (HCC) -     CBC with Differential/Platelet -     Comprehensive metabolic panel with GFR  Controlled type 2 diabetes mellitus with complication, without long-term current use of insulin (HCC) Assessment & Plan: Well controlled diabetes with complications  of CKD3 and peripheral artery disease. Latest A1C 5.9 Not on medications.  No neuropathy in the right leg.  Recommend to be uptodate with eye exams  Orders: -     Comprehensive metabolic panel with GFR -     Lipid panel -     Hemoglobin A1c  Benign essential hypertension -     CBC with Differential/Platelet -     Comprehensive metabolic panel with GFR  Need for hepatitis C screening test -     Hepatitis C antibody  Chronic bronchitis, unspecified chronic bronchitis type (HCC) Assessment & Plan: History of chronic bronchitis secondary to chronic tobacco use. Quit smoking in 2018. Not on inhalers at this time Denies any symptoms of wheezing or shortness of breath,   Will monitor. Appreciate her quitting smoking.    Aortoiliac occlusive disease (HCC) Assessment & Plan: Followed by vascular surgery.  Multiple surgeries.  Now doing well.  Trying to get up and going with her prosthesis. S/p left above knee amputation.   we need to be following her lipids.  Orders: -     Lipid panel  Type 2 diabetes mellitus with other specified complication, without long-term current use of insulin (HCC) Assessment & Plan: Well controlled diabetes with complications of  CKD3 and peripheral artery disease. Latest A1C 5.9 Not on medications.  No neuropathy in the right leg.  Recommend to be uptodate with eye exams   Screen for colon cancer -     Cologuard  Visit for screening mammogram -     3D Screening Mammogram, Left and Right; Future  Hearing decreased, bilateral -     Ambulatory referral to Audiology  H/O above knee amputation, left (HCC) Assessment & Plan: History of extensive peripheral artery disease, aorto iliac disease, leading to left above knee amputation in FEBRUARY 2018.  No prosthesis noted as she has stated it does not fit her well and causes pain. Uses manual wheelchair at home    Assessment and Plan            Body mass index is 17.63 kg/m.   These are the goals we discussed:  Goals   None      This is a list of the screening recommended for you and due dates:  Health Maintenance  Topic Date Due   Complete foot exam   Never done   Pap with HPV screening  Never done   Mammogram  Never done   Eye exam for diabetics  02/23/2023   COVID-19 Vaccine (3 - Moderna risk series) 05/31/2024*   Zoster (Shingles) Vaccine (1 of 2) 08/15/2024*   DTaP/Tdap/Td vaccine (1 - Tdap) 05/15/2025*   Colon Cancer Screening  05/15/2025*   Flu Shot  06/29/2024   Yearly kidney health urinalysis for diabetes  10/10/2024   Hemoglobin A1C  11/14/2024   Medicare Annual Wellness Visit  01/15/2025   Screening for Lung Cancer  01/31/2025   Yearly kidney function blood test for diabetes  05/15/2025   Pneumococcal Vaccination  Completed   Hepatitis C Screening  Completed   HIV Screening  Completed   HPV Vaccine  Aged Out   Meningitis B Vaccine  Aged Out  *Topic was postponed. The date shown is not the original due date.     No orders of the defined types were placed in this encounter.   Follow-up: Return for chronic disease follow up.  An After Visit Summary was printed and given to the patient.  Thara Searing, MD [facility] [phone]

## 2024-05-15 NOTE — Patient Instructions (Signed)
  VISIT SUMMARY: Today, you came in for a medication review and ear wax removal. We discussed your current medications, hearing difficulties, and general health maintenance needs.  YOUR PLAN: HEARING LOSS: You have been experiencing difficulty hearing despite attempts to remove ear wax. -We will refer you to audiology for a hearing evaluation.  EMBOLISM AND THROMBOSIS OF ILIAC ARTERY: You have a history of embolism and thrombosis in the iliac artery, which led to an above-knee amputation. You are currently taking Xarelto for this condition. -Continue taking Xarelto 2.5 mg twice daily.  CHRONIC PAIN: Your chronic pain is being managed with gabapentin . -Continue taking gabapentin  400 mg three times daily.  PTSD: Your PTSD is being managed with Paxil. -Continue taking Paxil 20 mg twice daily.  ANXIETY: Your anxiety is being managed with buspirone . -Continue taking buspirone  30 mg three times daily.  HYPERLIPIDEMIA: Your high cholesterol is being managed with atorvastatin . -Continue taking atorvastatin  80 mg daily. It's okay to break the pill in half for easier swallowing.  CHRONIC BRONCHITIS: You have suspected chronic bronchitis but no acute symptoms at this time. -No immediate action is needed, but monitor for any symptoms.  GENERAL HEALTH MAINTENANCE: You are due for some routine health screenings. -We will order a mammogram. -We will order a Cologuard stool test as an alternative to a colonoscopy. -We will perform a Pap smear today.  FOLLOW-UP: You need to come back for a follow-up visit. -Schedule a follow-up appointment in 3-4 months.                      Contains text generated by [employer]bridge.                                 Contains text generated by Abridge.

## 2024-05-16 LAB — CBC WITH DIFFERENTIAL/PLATELET
Basophils Absolute: 0.1 10*3/uL (ref 0.0–0.2)
Basos: 2 %
EOS (ABSOLUTE): 0.3 10*3/uL (ref 0.0–0.4)
Eos: 5 %
Hematocrit: 41.1 % (ref 34.0–46.6)
Hemoglobin: 13.3 g/dL (ref 11.1–15.9)
Immature Grans (Abs): 0 10*3/uL (ref 0.0–0.1)
Immature Granulocytes: 0 %
Lymphocytes Absolute: 1.8 10*3/uL (ref 0.7–3.1)
Lymphs: 26 %
MCH: 30 pg (ref 26.6–33.0)
MCHC: 32.4 g/dL (ref 31.5–35.7)
MCV: 93 fL (ref 79–97)
Monocytes Absolute: 0.7 10*3/uL (ref 0.1–0.9)
Monocytes: 10 %
Neutrophils Absolute: 4 10*3/uL (ref 1.4–7.0)
Neutrophils: 57 %
Platelets: 348 10*3/uL (ref 150–450)
RBC: 4.44 x10E6/uL (ref 3.77–5.28)
RDW: 12.5 % (ref 11.7–15.4)
WBC: 6.9 10*3/uL (ref 3.4–10.8)

## 2024-05-16 LAB — COMPREHENSIVE METABOLIC PANEL WITH GFR
ALT: 17 IU/L (ref 0–32)
AST: 15 IU/L (ref 0–40)
Albumin: 4.3 g/dL (ref 3.9–4.9)
Alkaline Phosphatase: 126 IU/L — ABNORMAL HIGH (ref 44–121)
BUN/Creatinine Ratio: 13 (ref 12–28)
BUN: 21 mg/dL (ref 8–27)
Bilirubin Total: <0.2 mg/dL (ref 0.0–1.2)
CO2: 22 mmol/L (ref 20–29)
Calcium: 9 mg/dL (ref 8.7–10.3)
Chloride: 106 mmol/L (ref 96–106)
Creatinine, Ser: 1.65 mg/dL — ABNORMAL HIGH (ref 0.57–1.00)
Globulin, Total: 2.9 g/dL (ref 1.5–4.5)
Glucose: 100 mg/dL — ABNORMAL HIGH (ref 70–99)
Potassium: 4.6 mmol/L (ref 3.5–5.2)
Sodium: 144 mmol/L (ref 134–144)
Total Protein: 7.2 g/dL (ref 6.0–8.5)
eGFR: 35 mL/min/{1.73_m2} — ABNORMAL LOW (ref >59)

## 2024-05-16 LAB — HEMOGLOBIN A1C
Est. average glucose Bld gHb Est-mCnc: 123 mg/dL
Hgb A1c MFr Bld: 5.9 % — ABNORMAL HIGH (ref 4.8–5.6)

## 2024-05-16 LAB — HEPATITIS C ANTIBODY: Hep C Virus Ab: NONREACTIVE

## 2024-05-16 LAB — LIPID PANEL
Chol/HDL Ratio: 5.3 ratio — ABNORMAL HIGH (ref 0.0–4.4)
Cholesterol, Total: 247 mg/dL — ABNORMAL HIGH (ref 100–199)
HDL: 47 mg/dL (ref >39)
LDL Chol Calc (NIH): 139 mg/dL — ABNORMAL HIGH (ref 0–99)
Triglycerides: 338 mg/dL — ABNORMAL HIGH (ref 0–149)
VLDL Cholesterol Cal: 61 mg/dL — ABNORMAL HIGH (ref 5–40)

## 2024-05-16 LAB — TSH: TSH: 5.32 u[IU]/mL — ABNORMAL HIGH (ref 0.450–4.500)

## 2024-05-17 ENCOUNTER — Other Ambulatory Visit: Payer: Self-pay

## 2024-05-17 ENCOUNTER — Ambulatory Visit: Payer: Self-pay

## 2024-05-17 DIAGNOSIS — E0590 Thyrotoxicosis, unspecified without thyrotoxic crisis or storm: Secondary | ICD-10-CM

## 2024-05-17 MED ORDER — LEVOTHYROXINE SODIUM 75 MCG PO TABS
75.0000 ug | ORAL_TABLET | Freq: Every day | ORAL | 3 refills | Status: AC
Start: 2024-05-17 — End: ?

## 2024-05-17 NOTE — Assessment & Plan Note (Signed)
 Followed by vascular surgery.  Multiple surgeries.  Now doing well.  Trying to get up and going with her prosthesis. S/p left above knee amputation.   we need to be following her lipids.

## 2024-05-17 NOTE — Assessment & Plan Note (Signed)
 Well controlled diabetes with complications of CKD3 and peripheral artery disease. Latest A1C 5.9 Not on medications.  No neuropathy in the right leg.  Recommend to be uptodate with eye exams

## 2024-05-17 NOTE — Assessment & Plan Note (Signed)
 General Health Maintenance Due for a mammogram and Pap smear. Declined repeat colonoscopy; agreed to Cologuard stool test as an alternative. - Order mammogram. - Order Cologuard stool test. - Perform Pap smear.in office today.  -uptodate with vaccines.

## 2024-05-17 NOTE — Assessment & Plan Note (Signed)
 History of chronic bronchitis secondary to chronic tobacco use. Quit smoking in 2018. Not on inhalers at this time Denies any symptoms of wheezing or shortness of breath,   Will monitor. Appreciate her quitting smoking.

## 2024-05-18 ENCOUNTER — Other Ambulatory Visit: Payer: Self-pay

## 2024-05-18 DIAGNOSIS — R921 Mammographic calcification found on diagnostic imaging of breast: Secondary | ICD-10-CM

## 2024-05-18 DIAGNOSIS — N6489 Other specified disorders of breast: Secondary | ICD-10-CM

## 2024-05-18 DIAGNOSIS — Z89612 Acquired absence of left leg above knee: Secondary | ICD-10-CM | POA: Insufficient documentation

## 2024-05-18 HISTORY — DX: Acquired absence of left leg above knee: Z89.612

## 2024-05-18 NOTE — Assessment & Plan Note (Signed)
 History of extensive peripheral artery disease, aorto iliac disease, leading to left above knee amputation in FEBRUARY 2018.  No prosthesis noted as she has stated it does not fit her well and causes pain. Uses manual wheelchair at home

## 2024-05-21 ENCOUNTER — Other Ambulatory Visit: Payer: Self-pay | Admitting: Cardiology

## 2024-05-21 LAB — IGP, APTIMA HPV, RFX 16/18,45
HPV Aptima: NEGATIVE
PAP Smear Comment: 0

## 2024-05-22 ENCOUNTER — Telehealth: Payer: Self-pay

## 2024-05-22 ENCOUNTER — Other Ambulatory Visit: Payer: Self-pay

## 2024-05-22 DIAGNOSIS — N6489 Other specified disorders of breast: Secondary | ICD-10-CM

## 2024-05-22 DIAGNOSIS — R921 Mammographic calcification found on diagnostic imaging of breast: Secondary | ICD-10-CM

## 2024-05-22 NOTE — Telephone Encounter (Signed)
 Called patient  and she would like to have her diagnostic mammogram at [facility]. We need to put new order. Please advice.  Copied from CRM [id number]. Topic: General - Other >> May 22, 2024  3:36 PM Edsel HERO wrote: Patient has questions about her upcoming mammogram.

## 2024-05-25 NOTE — Telephone Encounter (Signed)
 Yes, I did.  I put a new order to be scheduled in [facility]

## 2024-06-05 ENCOUNTER — Other Ambulatory Visit: Payer: Self-pay

## 2024-06-06 ENCOUNTER — Telehealth: Payer: Self-pay

## 2024-06-06 ENCOUNTER — Other Ambulatory Visit: Payer: Self-pay

## 2024-06-06 NOTE — Telephone Encounter (Signed)
 Copied from CRM [id number]. Topic: Appointments - Scheduling Inquiry for Clinic >> Jun 06, 2024  1:28 PM Antwanette L wrote: Reason for CRM: Nestora from [facility] is calling because she needs to schedule a mammogram for the patient. Makayla can be contacted at [phone]

## 2024-06-07 MED ORDER — BUSPIRONE HCL 15 MG PO TABS: 30.0000 mg | ORAL_TABLET | Freq: Three times a day (TID) | ORAL | 5 refills | Status: DC

## 2024-06-08 ENCOUNTER — Other Ambulatory Visit: Payer: Self-pay

## 2024-06-08 DIAGNOSIS — R921 Mammographic calcification found on diagnostic imaging of breast: Secondary | ICD-10-CM

## 2024-06-14 ENCOUNTER — Ambulatory Visit

## 2024-06-18 ENCOUNTER — Other Ambulatory Visit

## 2024-06-20 ENCOUNTER — Other Ambulatory Visit: Payer: Self-pay

## 2024-07-04 ENCOUNTER — Other Ambulatory Visit: Payer: Self-pay

## 2024-07-06 ENCOUNTER — Encounter

## 2024-07-06 ENCOUNTER — Other Ambulatory Visit

## 2024-07-13 ENCOUNTER — Other Ambulatory Visit: Payer: Self-pay

## 2024-07-13 DIAGNOSIS — N6489 Other specified disorders of breast: Secondary | ICD-10-CM

## 2024-07-13 DIAGNOSIS — R921 Mammographic calcification found on diagnostic imaging of breast: Secondary | ICD-10-CM

## 2024-07-21 ENCOUNTER — Other Ambulatory Visit: Payer: Self-pay

## 2024-07-21 DIAGNOSIS — K219 Gastro-esophageal reflux disease without esophagitis: Secondary | ICD-10-CM

## 2024-08-02 ENCOUNTER — Other Ambulatory Visit

## 2024-08-02 ENCOUNTER — Encounter

## 2024-08-03 ENCOUNTER — Other Ambulatory Visit: Payer: Self-pay | Admitting: Physician Assistant

## 2024-08-13 DIAGNOSIS — H9319 Tinnitus, unspecified ear: Secondary | ICD-10-CM | POA: Diagnosis not present

## 2024-08-13 DIAGNOSIS — H9190 Unspecified hearing loss, unspecified ear: Secondary | ICD-10-CM | POA: Diagnosis not present

## 2024-08-15 DIAGNOSIS — N6489 Other specified disorders of breast: Secondary | ICD-10-CM | POA: Diagnosis not present

## 2024-08-15 DIAGNOSIS — R92313 Mammographic fatty tissue density, bilateral breasts: Secondary | ICD-10-CM | POA: Diagnosis not present

## 2024-08-15 DIAGNOSIS — R928 Other abnormal and inconclusive findings on diagnostic imaging of breast: Secondary | ICD-10-CM | POA: Diagnosis not present

## 2024-08-15 DIAGNOSIS — R921 Mammographic calcification found on diagnostic imaging of breast: Secondary | ICD-10-CM | POA: Diagnosis not present

## 2024-08-17 DIAGNOSIS — F329 Major depressive disorder, single episode, unspecified: Secondary | ICD-10-CM | POA: Diagnosis not present

## 2024-08-21 ENCOUNTER — Ambulatory Visit

## 2024-08-21 ENCOUNTER — Other Ambulatory Visit: Payer: Self-pay

## 2024-08-30 LAB — COLOGUARD

## 2024-09-03 ENCOUNTER — Other Ambulatory Visit: Payer: Self-pay

## 2024-09-19 ENCOUNTER — Other Ambulatory Visit: Payer: Self-pay

## 2024-09-19 DIAGNOSIS — K219 Gastro-esophageal reflux disease without esophagitis: Secondary | ICD-10-CM

## 2024-10-02 ENCOUNTER — Other Ambulatory Visit: Payer: Self-pay

## 2024-10-09 ENCOUNTER — Ambulatory Visit: Admitting: Cardiology

## 2024-10-17 ENCOUNTER — Ambulatory Visit: Attending: Cardiology | Admitting: Cardiology

## 2024-10-18 ENCOUNTER — Other Ambulatory Visit: Payer: Self-pay

## 2024-11-01 ENCOUNTER — Other Ambulatory Visit: Payer: Self-pay

## 2024-11-02 LAB — COLOGUARD: COLOGUARD: NEGATIVE

## 2024-11-06 ENCOUNTER — Telehealth: Payer: Self-pay

## 2024-11-06 NOTE — Telephone Encounter (Signed)
 Copied from CRM #[id number]. Topic: Clinical - Medication Question >> Nov 06, 2024  4:39 PM Alfonso ORN wrote: Reason for CRM: [facility] called regarding levothyroxine  (SYNTHROID ) 75 MCG tablet rep stated you are supposed to stay with same brand so they would like to see if they can use another brand in stock since rx brand is out of stock .  Callback:[phone] [place]

## 2024-11-09 NOTE — Telephone Encounter (Signed)
 Pharmacy informed ok to change manufacturer.

## 2024-11-17 ENCOUNTER — Other Ambulatory Visit: Payer: Self-pay

## 2024-11-17 DIAGNOSIS — K219 Gastro-esophageal reflux disease without esophagitis: Secondary | ICD-10-CM

## 2024-11-26 ENCOUNTER — Other Ambulatory Visit: Payer: Self-pay

## 2024-11-26 MED ORDER — ONDANSETRON HCL 8 MG PO TABS: 8.0000 mg | ORAL_TABLET | Freq: Three times a day (TID) | ORAL | 0 refills | Status: DC | PRN

## 2024-12-03 ENCOUNTER — Telehealth: Payer: Self-pay | Admitting: Pharmacy Technician

## 2024-12-03 ENCOUNTER — Other Ambulatory Visit (HOSPITAL_COMMUNITY): Payer: Self-pay

## 2024-12-03 NOTE — Telephone Encounter (Signed)
 Pharmacy Patient Advocate Encounter   Received notification from CoverMyMeds that prior authorization for nexlizet  is required/requested.   Insurance verification completed.   The patient is insured through [employer].   Per test claim: The current 12/03/24 day co-pay is, $4.90- 3 months.  No PA needed at this time. This test claim was processed through [facility]- copay amounts may vary at other pharmacies due to pharmacy/plan contracts, or as the patient moves through the different stages of their insurance plan.

## 2024-12-18 ENCOUNTER — Other Ambulatory Visit: Payer: Self-pay

## 2024-12-19 ENCOUNTER — Other Ambulatory Visit: Payer: Self-pay | Admitting: Family Medicine

## 2025-01-16 ENCOUNTER — Ambulatory Visit

## 2025-05-16 ENCOUNTER — Ambulatory Visit
